# Patient Record
Sex: Female | Born: 1970
Health system: Southern US, Community
[De-identification: ages and names within clinical notes are randomized; demographics above are authoritative.]

## PROBLEM LIST (undated history)

## (undated) DIAGNOSIS — G709 Myoneural disorder, unspecified: Secondary | ICD-10-CM

## (undated) DIAGNOSIS — J302 Other seasonal allergic rhinitis: Secondary | ICD-10-CM

## (undated) DIAGNOSIS — F419 Anxiety disorder, unspecified: Secondary | ICD-10-CM

## (undated) DIAGNOSIS — R51 Headache: Secondary | ICD-10-CM

## (undated) DIAGNOSIS — J342 Deviated nasal septum: Secondary | ICD-10-CM

## (undated) DIAGNOSIS — F329 Major depressive disorder, single episode, unspecified: Secondary | ICD-10-CM

## (undated) DIAGNOSIS — G4730 Sleep apnea, unspecified: Secondary | ICD-10-CM

## (undated) DIAGNOSIS — G4700 Insomnia, unspecified: Secondary | ICD-10-CM

## (undated) DIAGNOSIS — J45909 Unspecified asthma, uncomplicated: Secondary | ICD-10-CM

## (undated) DIAGNOSIS — F32A Depression, unspecified: Secondary | ICD-10-CM

## (undated) DIAGNOSIS — K219 Gastro-esophageal reflux disease without esophagitis: Secondary | ICD-10-CM

## (undated) DIAGNOSIS — F411 Generalized anxiety disorder: Secondary | ICD-10-CM

## (undated) DIAGNOSIS — F319 Bipolar disorder, unspecified: Secondary | ICD-10-CM

## (undated) DIAGNOSIS — R112 Nausea with vomiting, unspecified: Secondary | ICD-10-CM

## (undated) DIAGNOSIS — J189 Pneumonia, unspecified organism: Secondary | ICD-10-CM

## (undated) DIAGNOSIS — Z9889 Other specified postprocedural states: Secondary | ICD-10-CM

## (undated) DIAGNOSIS — J343 Hypertrophy of nasal turbinates: Secondary | ICD-10-CM

## (undated) DIAGNOSIS — G4733 Obstructive sleep apnea (adult) (pediatric): Secondary | ICD-10-CM

## (undated) HISTORY — PX: ABDOMINAL HYSTERECTOMY: SHX81

## (undated) HISTORY — DX: Anxiety disorder, unspecified: F41.9

## (undated) HISTORY — DX: Sleep apnea, unspecified: G47.30

## (undated) HISTORY — DX: Insomnia, unspecified: G47.00

## (undated) HISTORY — PX: CARPAL TUNNEL RELEASE: SHX101

## (undated) HISTORY — DX: Depression, unspecified: F32.A

## (undated) HISTORY — DX: Generalized anxiety disorder: F41.1

## (undated) HISTORY — PX: DIAGNOSTIC LAPAROSCOPY: SUR761

## (undated) HISTORY — DX: Major depressive disorder, single episode, unspecified: F32.9

## (undated) HISTORY — DX: Obstructive sleep apnea (adult) (pediatric): G47.33

## (undated) HISTORY — PX: FOOT SURGERY: SHX648

---

## 1999-09-20 ENCOUNTER — Other Ambulatory Visit: Admission: RE | Admit: 1999-09-20 | Discharge: 1999-09-20 | Payer: Self-pay | Admitting: *Deleted

## 2000-09-19 ENCOUNTER — Other Ambulatory Visit: Admission: RE | Admit: 2000-09-19 | Discharge: 2000-09-19 | Payer: Self-pay | Admitting: Obstetrics and Gynecology

## 2001-11-08 ENCOUNTER — Other Ambulatory Visit: Admission: RE | Admit: 2001-11-08 | Discharge: 2001-11-08 | Payer: Self-pay | Admitting: Obstetrics and Gynecology

## 2002-08-02 ENCOUNTER — Inpatient Hospital Stay (HOSPITAL_COMMUNITY): Admission: AD | Admit: 2002-08-02 | Discharge: 2002-08-04 | Payer: Self-pay | Admitting: Obstetrics and Gynecology

## 2002-09-23 ENCOUNTER — Encounter: Admission: RE | Admit: 2002-09-23 | Discharge: 2002-10-23 | Payer: Self-pay | Admitting: Obstetrics and Gynecology

## 2002-11-23 ENCOUNTER — Encounter: Admission: RE | Admit: 2002-11-23 | Discharge: 2002-12-23 | Payer: Self-pay | Admitting: Obstetrics and Gynecology

## 2003-01-23 ENCOUNTER — Encounter: Admission: RE | Admit: 2003-01-23 | Discharge: 2003-02-22 | Payer: Self-pay | Admitting: Obstetrics and Gynecology

## 2003-11-20 ENCOUNTER — Other Ambulatory Visit: Admission: RE | Admit: 2003-11-20 | Discharge: 2003-11-20 | Payer: Self-pay | Admitting: Obstetrics and Gynecology

## 2004-07-10 ENCOUNTER — Emergency Department (HOSPITAL_COMMUNITY): Admission: EM | Admit: 2004-07-10 | Discharge: 2004-07-10 | Payer: Self-pay | Admitting: Emergency Medicine

## 2004-11-10 ENCOUNTER — Inpatient Hospital Stay (HOSPITAL_COMMUNITY): Admission: AD | Admit: 2004-11-10 | Discharge: 2004-11-10 | Payer: Self-pay | Admitting: Obstetrics and Gynecology

## 2004-12-07 ENCOUNTER — Inpatient Hospital Stay (HOSPITAL_COMMUNITY): Admission: AD | Admit: 2004-12-07 | Discharge: 2004-12-07 | Payer: Self-pay | Admitting: Obstetrics and Gynecology

## 2004-12-15 ENCOUNTER — Inpatient Hospital Stay (HOSPITAL_COMMUNITY): Admission: AD | Admit: 2004-12-15 | Discharge: 2004-12-17 | Payer: Self-pay | Admitting: Obstetrics and Gynecology

## 2004-12-16 ENCOUNTER — Encounter (INDEPENDENT_AMBULATORY_CARE_PROVIDER_SITE_OTHER): Payer: Self-pay | Admitting: Specialist

## 2004-12-16 HISTORY — PX: TUBAL LIGATION: SHX77

## 2005-01-17 ENCOUNTER — Other Ambulatory Visit: Admission: RE | Admit: 2005-01-17 | Discharge: 2005-01-17 | Payer: Self-pay | Admitting: Obstetrics and Gynecology

## 2007-03-14 ENCOUNTER — Encounter (INDEPENDENT_AMBULATORY_CARE_PROVIDER_SITE_OTHER): Payer: Self-pay | Admitting: Obstetrics and Gynecology

## 2007-03-14 ENCOUNTER — Inpatient Hospital Stay (HOSPITAL_COMMUNITY): Admission: RE | Admit: 2007-03-14 | Discharge: 2007-03-16 | Payer: Self-pay | Admitting: Obstetrics and Gynecology

## 2007-03-14 HISTORY — PX: LAPAROSCOPIC VAGINAL HYSTERECTOMY: SUR798

## 2008-04-16 ENCOUNTER — Encounter: Admission: RE | Admit: 2008-04-16 | Discharge: 2008-05-15 | Payer: Self-pay | Admitting: Orthopedic Surgery

## 2009-03-31 ENCOUNTER — Encounter: Admission: RE | Admit: 2009-03-31 | Discharge: 2009-03-31 | Payer: Self-pay | Admitting: Gastroenterology

## 2009-04-23 ENCOUNTER — Ambulatory Visit (HOSPITAL_COMMUNITY): Admission: RE | Admit: 2009-04-23 | Discharge: 2009-04-23 | Payer: Self-pay | Admitting: Gastroenterology

## 2009-07-26 ENCOUNTER — Emergency Department (HOSPITAL_BASED_OUTPATIENT_CLINIC_OR_DEPARTMENT_OTHER): Admission: EM | Admit: 2009-07-26 | Discharge: 2009-07-26 | Payer: Self-pay | Admitting: Emergency Medicine

## 2010-01-31 ENCOUNTER — Encounter: Payer: Self-pay | Admitting: Obstetrics and Gynecology

## 2010-05-25 NOTE — Op Note (Signed)
NAME:  Margaret Lopez, Margaret Lopez                 ACCOUNT NO.:  [id number]   MEDICAL RECORD NO.:  [id number]          PATIENT TYPE:  AMB   LOCATION:  SDC                           FACILITY:  [facility]   PHYSICIAN:  Malva Limes, M.D.    DATE OF BIRTH:  04-12-1970   DATE OF PROCEDURE:  03/14/2007  DATE OF DISCHARGE:                               OPERATIVE REPORT   PREOPERATIVE DIAGNOSES:  1. Menorrhagia.  2. Dysmenorrhea.   POSTOPERATIVE DIAGNOSES:  1. Menorrhagia.  2. Dysmenorrhea.   PROCEDURES:  1. Diagnostic laparoscopy.  2. Total vaginal hysterectomy.   SURGEON:  Malva Limes, M.D.   ASSISTANT:  Luvenia Redden, M.D.   ANESTHESIA:  General endotracheal.   ANTIBIOTICS:  Ancef 1 g.   DRAINS:  Foley to bedside drainage.   SPECIMENS:  Cervix and uterus sent to pathology.   COMPLICATIONS:  None.   ESTIMATED BLOOD LOSS:  100 mL.   FINDINGS:  On laparoscopy the patient had normal-appearing fallopian  tubes and ovaries.  There was no evidence of any pelvic adhesions or  endometriosis.  There was evidence of past tubal ligation on the right;  however, the left tube appeared to be normal.   PROCEDURE:  The patient was taken to the operating room, where a general  anesthetic was administered without complications.  She was then placed  in the dorsal lithotomy position.  A vertical skin incision was made in  the umbilicus.  The fascia was grasped and entered with the Mayo  scissors.  The parietal peritoneum was entered bluntly.  A 0 Monocryl  suture was placed in a pursestring fashion.  The Hasson cannula was  placed into the peritoneal cavity and 3 L of carbon dioxide was  insufflated.  The patient was then placed in Trendelenburg and  examination of the pelvic anatomy was undertaken.  Findings were noted  as above.  At this point the laparoscopy was concluded, the  pneumoperitoneum was released, the instruments removed and attention was  then turned to the vagina.  The cervix was grasped  with a single-tooth  tenaculum and injected with 1% lidocaine with epinephrine.  The  posterior cul-de-sac was entered sharply.  The uterosacral ligaments  were bilaterally clamped, cut and ligated with 0 Monocryl suture.  The  cervix was then circumscribed.  The bladder pillars were then  bilaterally clamped, cut and ligated with 0 Monocryl suture.  The  anterior cul-de-sac was entered sharply.  Cardinal ligaments were  serially clamped, cut and ligated with 0 Monocryl suture.  Uterine  vessels were bilaterally clamped, cut and ligated with 0 Monocryl  suture.  Remaining broad ligament was clamped, cut and ligated with 0  Monocryl suture.  Once the level of the round ligament was reached, the  uterus was inverted and the triple pedicle, which included the fallopian  tube, the round ligament and the ovarian ligament, was bilaterally  clamped, cut and ligated x2 with 0 Monocryl suture.  All pedicles were  then checked and found to be hemostatic.  At this point the catheter was  placed in the bladder  and the urine noted be clear.  The posterior cuff  was then closed using 2-0 Vicryl in a running locking fashion.  The  remaining vaginal cuff was closed using 2-0 Vicryl in a running locking  fashion in a vertical manner.  This concluded the procedure.  The  peritoneum was then insufflated again, the scope placed, and there was  no evidence of bleeding.  This concluded the laparoscopy.  The  instruments were removed, the pneumoperitoneum released, the incision  closed with 0 Monocryl suture, the skin with 4-0 Vicryl suture.  The  patient was extubated and taken to the recovery room in stable  condition.  Instrument and lap counts correct x2.           ______________________________  Malva Limes, M.D.     MA/MEDQ  D:  03/14/2007  T:  03/14/2007  Job:  [id number]

## 2010-05-25 NOTE — Discharge Summary (Signed)
[name], [name]                 ACCOUNT NO.:  [id number]   MEDICAL RECORD NO.:  [id number]          PATIENT TYPE:  INP   LOCATION:  9317                          FACILITY:  [facility]   PHYSICIAN:  Malva Limes, M.D.    DATE OF BIRTH:  10-13-1970   DATE OF ADMISSION:  03/14/2007  DATE OF DISCHARGE:  03/16/2007                               DISCHARGE SUMMARY   DISCHARGE DIAGNOSES:  1. Menorrhagia.  2. Dysmenorrhea.  3. History of endometriosis.   PROCEDURE:  1. Diagnostic laparoscopy.  2. Total vaginal hysterectomy.   HISTORY OF PRESENT ILLNESS:  Margaret Lopez is a 40 year old white female who  presented to [facility] on March 14, 2007 for a diagnostic  laparoscopy and a subsequent total vaginal hysterectomy,  secondary two-  year history of worsening dysmenorrhea and menorrhagia.  The patient did  have a diagnostic laparoscopy in 1992 with treatment of endometriosis at  that time. The patient underwent diagnostic laparoscopy at which time no  evidence of endometriosis or adhesions were seen. The ovaries and tubes  appeared to be normal.  The laparoscopy was discontinued and a total  vaginal hysterectomy accomplished without difficulty.  A complete  description of these procedures can be found in the dictated operative  note.  The patient's postoperative course was benign.  The patient's  preoperative hemoglobin was 12.9 and postop 9.9. The patient remained  afebrile. At the time of discharge, she was ambulate without difficulty  and eating a regular diet. The patient was discharged to home on postop  day #2. She was sent home with Percocet to take p.r.n. She will follow  up in the office in 4 weeks.  Pathology is pending at this time.           ______________________________  Malva Limes, M.D.     MA/MEDQ  D:  03/16/2007  T:  03/17/2007  Job:  [id number]

## 2010-05-28 NOTE — Op Note (Signed)
Margaret Lopez, Margaret Lopez                 ACCOUNT NO.:  [id number]   MEDICAL RECORD NO.:  [id number]          PATIENT TYPE:  INP   LOCATION:  9133                          [facility]ACILITY:  [facility]   PHYSICIAN:  Gerrit Friends. Aldona Bar, M.D.   DATE OF BIRTH:  [birth year]   DATE OF PROCEDURE:  12/16/2004  DATE OF DISCHARGE:                                 OPERATIVE REPORT   PREOPERATIVE DIAGNOSIS:  Postpartum desire for permanent elective  sterilization.   POSTOPERATIVE DIAGNOSES:  Postpartum desire for permanent elective  sterilization.  Pathology pending.   PROCEDURE:  [place] tubal sterilization for permanent elective sterilization  postpartum.   SURGEON:  Gerrit Friends. Aldona Bar, M.D.   ANESTHESIA:  Epidural.   HISTORY:  This 40 year old gravida 2, now para 2 delivered on the evening of  12/06 and expressed a desire for permanent elective sterilization procedure.  She is aware that such procedure is meant to be 100% permanent but  unfortunately is not 100% perfect- subsequent pregnancy can result.  According to her wishes. She is now being taken to the operating room for a  postpartum tubal sterilization procedure.   PROCEDURE:  The patient was taken to the operating room where epidural was  augmented with great success. Thereafter she was prepped and draped in the  usual fashion for a tubal sterilization procedure with the bladder being  emptied prior to arrival in the operating room.   When she was adequately draped and good anesthetic levels were documented,  procedure was begun.   The subumbilical area was elevated with Allis clamps and 1.5 to 2 cm  subumbilical midline transverse skin incision was made and with minimal  difficulty dissected down to and through the peritoneum without difficulty.  The fundus of the uterus was palpated and observed and noted to be  completely normal. The right adnexa felt normal - the right fallopian tube  was visualized, traced out the fimbriated end for positive  identification  and then in the midportion of the right fallopian tube, elevation was  created in a single tie of #1 plain catgut suture tied down about a knuckle  of tube. The knuckle of tube was excised and sent to pathology appropriately  labeled. Good hemostasis was noted. Similar procedure was carried out on  left fallopian tube. At this time with a segment of each fallopian tube  being removed and no pathology appreciated and hemostasis very adequate,  procedure was felt to be complete and closure of the abdomen was carried  out. The abdominal peritoneum was closed with 0 Vicryl in running fashion  and abdominal fascia was closed with 0 Vicryl in interrupted fashion. Skin  and subcutaneous tissue were closed with a  continuous 4-0 Vicryl subcuticular closure. Dressing was applied. The  patient was transported to recovery room in satisfactory condition having  tolerated the procedure well. Estimated blood loss negligible. All counts  correct x2. Pathologic specimen consisted of segment of each fallopian tube.      Gerrit Friends. Aldona Bar, M.D.  Electronically Signed     RMW/MEDQ  D:  12/16/2004  T:  12/16/2004  Job:  [id number]

## 2010-05-28 NOTE — Discharge Summary (Signed)
   NAME:  Margaret Lopez, Margaret Lopez                           ACCOUNT NO.:  [id number]   MEDICAL RECORD NO.:  [id number]                   PATIENT TYPE:  INP   LOCATION:  9148                                 [facility]ACILITY:  [facility]   PHYSICIAN:  Ilda Mori, M.D.                DATE OF BIRTH:  1970/07/30   DATE OF ADMISSION:  08/02/2002  DATE OF DISCHARGE:  08/04/2002                                 DISCHARGE SUMMARY   FINAL DIAGNOSES:  1. Uterine pregnancy, delivered.  2. Living, well female child.   SECONDARY DIAGNOSES:  None.   PROCEDURES:  1. Vacuum-assisted vaginal delivery.  2. Repair second-degree perineal laceration.   COMPLICATIONS:  None.   CONDITION ON DISCHARGE:  Improved.   HISTORY:  This is a 40 year old primigravida, with a due date of August 07, 2002, who was admitted, on August 02, 2002, at 39 plus weeks because of the  possibility of early toxemia, with blood pressure noted headache.  The  patient noted headaches, increased edema and a blood pressure of 130/80.  Her cervix was 2-cm size and 80% effaced.  The decision was made to proceed  with induction with a favorable cervix and term pregnancy and possible  toxemia.  The patient had a normal labor course after pushing for two hours.  It was felt that the vacuum assist was indicated.  This was performed with  the vertex at +3 station with the delivery of a female infant, Apgar scores 8  and 9, birth weight 8 pounds 4 ounces.  She had no episiotomy, second-degree  perineal laceration which was repaired.  She had had an epidural anesthesia  for labor and delivery.   The patient's postpartum course was benign.  On the morning of postpartum  day #2, she was felt to be ready for discharge.  She was discharged on a  regular diet.  Told to take over the counter pain medications, prenatal  vitamins and return to the office in four weeks.   LABORATORY DATA:  Revealed a postpartum hemoglobin of 8.6, white count of  10.9 and normal platelet  count.                                                 Ilda Mori, M.D.    RK/MEDQ  D:  09/06/2002  T:  09/07/2002  Job:  [id number]

## 2010-06-22 ENCOUNTER — Other Ambulatory Visit: Payer: Self-pay | Admitting: Obstetrics and Gynecology

## 2010-10-04 LAB — CBC
HCT: 37.1
Hemoglobin: 12.9
Hemoglobin: 9.9 — ABNORMAL LOW
Platelets: 219
RBC: 4.05
RDW: 12.5
RDW: 12.8
WBC: 7.4

## 2010-12-16 ENCOUNTER — Other Ambulatory Visit (INDEPENDENT_AMBULATORY_CARE_PROVIDER_SITE_OTHER): Payer: Self-pay | Admitting: Otolaryngology

## 2010-12-17 ENCOUNTER — Ambulatory Visit
Admission: RE | Admit: 2010-12-17 | Discharge: 2010-12-17 | Disposition: A | Discharge status: Home / Self Care | Payer: BC Managed Care – PPO | Source: Ambulatory Visit | Attending: Otolaryngology | Admitting: Otolaryngology

## 2011-02-11 DIAGNOSIS — J342 Deviated nasal septum: Secondary | ICD-10-CM

## 2011-02-11 DIAGNOSIS — J343 Hypertrophy of nasal turbinates: Secondary | ICD-10-CM

## 2011-02-11 HISTORY — DX: Deviated nasal septum: J34.2

## 2011-02-11 HISTORY — DX: Hypertrophy of nasal turbinates: J34.3

## 2011-02-18 ENCOUNTER — Encounter (HOSPITAL_BASED_OUTPATIENT_CLINIC_OR_DEPARTMENT_OTHER): Payer: Self-pay | Admitting: *Deleted

## 2011-02-22 ENCOUNTER — Ambulatory Visit (HOSPITAL_BASED_OUTPATIENT_CLINIC_OR_DEPARTMENT_OTHER): Payer: BC Managed Care – PPO | Admitting: Anesthesiology

## 2011-02-22 ENCOUNTER — Encounter (HOSPITAL_BASED_OUTPATIENT_CLINIC_OR_DEPARTMENT_OTHER): Payer: Self-pay | Admitting: Anesthesiology

## 2011-02-22 ENCOUNTER — Encounter (HOSPITAL_BASED_OUTPATIENT_CLINIC_OR_DEPARTMENT_OTHER): Payer: Self-pay | Admitting: *Deleted

## 2011-02-22 ENCOUNTER — Encounter (HOSPITAL_BASED_OUTPATIENT_CLINIC_OR_DEPARTMENT_OTHER)
Admission: RE | Disposition: A | Discharge status: Home / Self Care | Payer: Self-pay | Source: Ambulatory Visit | Attending: Otolaryngology

## 2011-02-22 ENCOUNTER — Ambulatory Visit (HOSPITAL_BASED_OUTPATIENT_CLINIC_OR_DEPARTMENT_OTHER)
Admission: RE | Admit: 2011-02-22 | Discharge: 2011-02-22 | Disposition: A | Discharge status: Home / Self Care | Payer: BC Managed Care – PPO | Source: Ambulatory Visit | Attending: Otolaryngology | Admitting: Otolaryngology

## 2011-02-22 DIAGNOSIS — K219 Gastro-esophageal reflux disease without esophagitis: Secondary | ICD-10-CM | POA: Insufficient documentation

## 2011-02-22 DIAGNOSIS — Z9889 Other specified postprocedural states: Secondary | ICD-10-CM

## 2011-02-22 DIAGNOSIS — F17200 Nicotine dependence, unspecified, uncomplicated: Secondary | ICD-10-CM | POA: Insufficient documentation

## 2011-02-22 DIAGNOSIS — R51 Headache: Secondary | ICD-10-CM | POA: Insufficient documentation

## 2011-02-22 DIAGNOSIS — J342 Deviated nasal septum: Secondary | ICD-10-CM | POA: Insufficient documentation

## 2011-02-22 DIAGNOSIS — J3489 Other specified disorders of nose and nasal sinuses: Secondary | ICD-10-CM | POA: Insufficient documentation

## 2011-02-22 DIAGNOSIS — J343 Hypertrophy of nasal turbinates: Secondary | ICD-10-CM | POA: Insufficient documentation

## 2011-02-22 HISTORY — DX: Gastro-esophageal reflux disease without esophagitis: K21.9

## 2011-02-22 HISTORY — DX: Deviated nasal septum: J34.2

## 2011-02-22 HISTORY — DX: Headache: R51

## 2011-02-22 HISTORY — DX: Hypertrophy of nasal turbinates: J34.3

## 2011-02-22 HISTORY — PX: NASAL SEPTOPLASTY W/ TURBINOPLASTY: SHX2070

## 2011-02-22 HISTORY — DX: Other seasonal allergic rhinitis: J30.2

## 2011-02-22 HISTORY — DX: Bipolar disorder, unspecified: F31.9

## 2011-02-22 LAB — POCT HEMOGLOBIN-HEMACUE: Hemoglobin: 12.1 g/dL (ref 12.0–15.0)

## 2011-02-22 SURGERY — SEPTOPLASTY, NOSE, WITH NASAL TURBINATE REDUCTION
Anesthesia: General | Site: Nose | Laterality: Bilateral | Wound class: Clean Contaminated

## 2011-02-22 MED ORDER — DEXAMETHASONE SODIUM PHOSPHATE 4 MG/ML IJ SOLN
INTRAMUSCULAR | Status: DC | PRN
  Administered 2011-02-22: 10 mg via INTRAVENOUS

## 2011-02-22 MED ORDER — MORPHINE SULFATE 4 MG/ML IJ SOLN: 0.0500 mg/kg | INTRAMUSCULAR | Status: DC | PRN

## 2011-02-22 MED ORDER — MUPIROCIN 2 % EX OINT
TOPICAL_OINTMENT | CUTANEOUS | Status: DC | PRN
  Administered 2011-02-22: 1 via NASAL

## 2011-02-22 MED ORDER — MIDAZOLAM HCL 5 MG/5ML IJ SOLN
INTRAMUSCULAR | Status: DC | PRN
  Administered 2011-02-22: 2 mg via INTRAVENOUS

## 2011-02-22 MED ORDER — DROPERIDOL 2.5 MG/ML IJ SOLN
INTRAMUSCULAR | Status: DC | PRN
  Administered 2011-02-22: 0.625 mg via INTRAVENOUS

## 2011-02-22 MED ORDER — LIDOCAINE HCL (CARDIAC) 20 MG/ML IV SOLN
INTRAVENOUS | Status: DC | PRN
  Administered 2011-02-22: 40 mg via INTRAVENOUS

## 2011-02-22 MED ORDER — ONDANSETRON HCL 4 MG/2ML IJ SOLN
INTRAMUSCULAR | Status: DC | PRN
  Administered 2011-02-22: 4 mg via INTRAVENOUS

## 2011-02-22 MED ORDER — FENTANYL CITRATE 0.05 MG/ML IJ SOLN
INTRAMUSCULAR | Status: DC | PRN
  Administered 2011-02-22 (×2): 25 ug via INTRAVENOUS
  Administered 2011-02-22: 50 ug via INTRAVENOUS
  Administered 2011-02-22 (×6): 25 ug via INTRAVENOUS
  Administered 2011-02-22: 50 ug via INTRAVENOUS

## 2011-02-22 MED ORDER — HYDROMORPHONE HCL PF 1 MG/ML IJ SOLN
0.2500 mg | INTRAMUSCULAR | Status: DC | PRN
  Administered 2011-02-22: 0.5 mg via INTRAVENOUS

## 2011-02-22 MED ORDER — OXYMETAZOLINE HCL 0.05 % NA SOLN
NASAL | Status: DC | PRN
  Administered 2011-02-22: 1 via NASAL

## 2011-02-22 MED ORDER — LACTATED RINGERS IV SOLN
INTRAVENOUS | Status: DC
  Administered 2011-02-22 (×2): via INTRAVENOUS

## 2011-02-22 MED ORDER — MIDAZOLAM HCL 2 MG/2ML IJ SOLN: 0.5000 mg | INTRAMUSCULAR | Status: DC | PRN

## 2011-02-22 MED ORDER — CEFAZOLIN SODIUM 1-5 GM-% IV SOLN
INTRAVENOUS | Status: DC | PRN
  Administered 2011-02-22: 1 g via INTRAVENOUS

## 2011-02-22 MED ORDER — ACETAMINOPHEN 10 MG/ML IV SOLN
1000.0000 mg | Freq: Once | INTRAVENOUS | Status: AC
  Administered 2011-02-22: 1000 mg via INTRAVENOUS

## 2011-02-22 MED ORDER — SUCCINYLCHOLINE CHLORIDE 20 MG/ML IJ SOLN
INTRAMUSCULAR | Status: DC | PRN
  Administered 2011-02-22: 100 mg via INTRAVENOUS

## 2011-02-22 MED ORDER — METOCLOPRAMIDE HCL 5 MG/ML IJ SOLN
10.0000 mg | Freq: Once | INTRAMUSCULAR | Status: AC | PRN
  Administered 2011-02-22: 10 mg via INTRAVENOUS

## 2011-02-22 MED ORDER — PROPOFOL 10 MG/ML IV EMUL
INTRAVENOUS | Status: DC | PRN
  Administered 2011-02-22: 250 mg via INTRAVENOUS

## 2011-02-22 MED ORDER — LIDOCAINE-EPINEPHRINE 1 %-1:100000 IJ SOLN
INTRAMUSCULAR | Status: DC | PRN
  Administered 2011-02-22: 5 mL via INTRADERMAL

## 2011-02-22 SURGICAL SUPPLY — 35 items
ATTRACTOMAT 16X20 MAGNETIC DRP (DRAPES) IMPLANT
BLADE SURG 15 STRL LF DISP TIS (BLADE) IMPLANT
BLADE SURG 15 STRL SS (BLADE)
CANISTER SUCTION 1200CC (MISCELLANEOUS) ×2 IMPLANT
CLOTH BEACON ORANGE TIMEOUT ST (SAFETY) ×2 IMPLANT
COAGULATOR SUCT 8FR VV (MISCELLANEOUS) ×2 IMPLANT
DECANTER SPIKE VIAL GLASS SM (MISCELLANEOUS) IMPLANT
DRSG NASOPORE 8CM (GAUZE/BANDAGES/DRESSINGS) IMPLANT
ELECT REM PT RETURN 9FT ADLT (ELECTROSURGICAL) ×2
ELECTRODE REM PT RTRN 9FT ADLT (ELECTROSURGICAL) ×1 IMPLANT
GAUZE SPONGE 4X4 12PLY STRL LF (GAUZE/BANDAGES/DRESSINGS) ×1 IMPLANT
GLOVE BIO SURGEON STRL SZ7.5 (GLOVE) ×2 IMPLANT
GLOVE SKINSENSE NS SZ7.0 (GLOVE) ×1
GLOVE SKINSENSE STRL SZ7.0 (GLOVE) IMPLANT
GOWN PREVENTION PLUS XLARGE (GOWN DISPOSABLE) ×3 IMPLANT
NDL HYPO 25X1 1.5 SAFETY (NEEDLE) ×1 IMPLANT
NEEDLE HYPO 25X1 1.5 SAFETY (NEEDLE) ×2 IMPLANT
NS IRRIG 1000ML POUR BTL (IV SOLUTION) ×2 IMPLANT
PACK BASIN DAY SURGERY FS (CUSTOM PROCEDURE TRAY) ×2 IMPLANT
PACK ENT DAY SURGERY (CUSTOM PROCEDURE TRAY) ×2 IMPLANT
SLEEVE SCD COMPRESS KNEE MED (MISCELLANEOUS) IMPLANT
SOLUTION BUTLER CLEAR DIP (MISCELLANEOUS) ×2 IMPLANT
SPLINT NASAL DOYLE BI-VL (GAUZE/BANDAGES/DRESSINGS) ×2 IMPLANT
SPONGE GAUZE 2X2 8PLY STRL LF (GAUZE/BANDAGES/DRESSINGS) ×2 IMPLANT
SPONGE NEURO XRAY DETECT 1X3 (DISPOSABLE) ×2 IMPLANT
SUT CHROMIC 4 0 P 3 18 (SUTURE) ×2 IMPLANT
SUT PLAIN 4 0 ~~LOC~~ 1 (SUTURE) ×2 IMPLANT
SUT PROLENE 3 0 PS 2 (SUTURE) ×2 IMPLANT
SUT VIC AB 4-0 P-3 18XBRD (SUTURE) IMPLANT
SUT VIC AB 4-0 P3 18 (SUTURE)
TOWEL OR 17X24 6PK STRL BLUE (TOWEL DISPOSABLE) ×2 IMPLANT
TUBE SALEM SUMP 12R W/ARV (TUBING) IMPLANT
TUBE SALEM SUMP 16 FR W/ARV (TUBING) ×2 IMPLANT
WATER STERILE IRR 1000ML POUR (IV SOLUTION) ×2 IMPLANT
YANKAUER SUCT BULB TIP NO VENT (SUCTIONS) ×2 IMPLANT

## 2011-02-22 NOTE — Op Note (Signed)
DATE OF PROCEDURE: 02/22/2011  OPERATIVE REPORT   SURGEON: Newman Pies, MD   PREOPERATIVE DIAGNOSES:  1. Nasal septal deviation.  2. Bilateral inferior turbinate hypertrophy.  3. Chronic nasal obstruction.  POSTOPERATIVE DIAGNOSES:  1. Nasal septal deviation.  2. Bilateral inferior turbinate hypertrophy.  3. Chronic nasal obstruction.  PROCEDURE PERFORMED:  1. Septoplasty.  2. Bilateral partial inferior turbinate resection.   ANESTHESIA: General endotracheal tube anesthesia.   COMPLICATIONS: None.   ESTIMATED BLOOD LOSS: Approximately 400 mL.   INDICATION FOR PROCEDURE: Margaret Lopez is a 41 y.o. female with a history of chronic nasal obstruction. The patient was  treated with antihistamine, decongestant, steroid nasal spray, and systemic steroids. However, the patient continues to be symptomatic. On examination, the patient was noted to have bilateral inferior turbinate hypertrophy and significant nasal septal deviation, causing significant nasal obstruction. Based on the above findings, the decision was made for the patient to undergo the above-stated procedure. The risks, benefits, alternatives, and details of the procedure were discussed with the patient. Questions were invited and answered. Informed consent was obtained.   DESCRIPTION OF PROCEDURE: The patient was taken to the operating room and placed supine on the operating table. General endotracheal tube anesthesia was administered by the anesthesiologist. The patient was positioned, and prepped and draped in the standard fashion for nasal surgery. Pledgets soaked with Afrin were placed in both nasal cavities for decongestion. The pledgets were subsequently removed. The above mentioned severe septal deviation was again noted. 1% lidocaine with 1:100,000 epinephrine was injected onto the nasal septum bilaterally. A hemitransfixion incision was made on the left side. The mucosal flap was carefully elevated on the left side. A  cartilaginous incision was made 1 cm superior to the caudal margin of the nasal septum. Mucosal flap was also elevated on the right side in the similar fashion. It should be noted that due to the severe septal deviation, the deviated portion of the cartilaginous and bony septum had to be removed in piecemeal fashion. Once the deviated portions were removed, a straight midline septum was achieved. The septum was then quilted with 4-0 plain gut sutures. The hemitransfixion incision was closed with interrupted 4-0 chromic sutures. Doyle splints were applied.   Prior to the [facility] splint application, the inferior one half of both hypertrophied inferior turbinate was crossclamped with a Kelly clamp. The inferior one half of each inferior turbinate was then resected with a pair of cross cutting scissors. Hemostasis was achieved with a suction cautery device.   The care of the patient was turned over to the anesthesiologist. The patient was awakened from anesthesia without difficulty. The patient was extubated and transferred to the recovery room in good condition.   OPERATIVE FINDINGS: Severe nasal septal deviation and bilateral inferior turbinate hypertrophy.   SPECIMEN: None.   FOLLOWUP CARE: The patient be discharged home once she is awake and alert. The patient will be placed on Percocet 1-2 tablets p.o. q.6 hours p.r.n. pain, and Zpak. for 5 days. The patient will follow up in my office in approximately 1 week for splint removal.   Margaret Lopez Philomena Doheny, MD

## 2011-02-22 NOTE — Discharge Instructions (Addendum)
POSTOPERATIVE INSTRUCTIONS FOR PATIENTS HAVING NASAL OR SINUS OPERATIONS ACTIVITY: Restrict activity at home for the first two days, resting as much as possible. Light activity is best. You may usually return to work within a week. You should refrain from nose blowing, strenuous activity, or heavy lifting greater than 20lbs for a total of three weeks after your operation.  If sneezing cannot be avoided, sneeze with your mouth open. DISCOMFORT: You may experience a dull headache and pressure along with nasal congestion and discharge. These symptoms may be worse during the first week after the operation but may last as long as two to four weeks.  Please take Tylenol or the pain medication that has been prescribed for you. Do not take aspirin or aspirin containing medications since they may cause bleeding.  You may experience symptoms of post nasal drainage, nasal congestion, headaches and fatigue for two or three months after your operation.  BLEEDING: You may have some blood tinged nasal drainage for approximately two weeks after the operation.  The discharge will be worse for the first week.  Please call our office at [phone] or go to the nearest hospital emergency room if you experience any of the following: heavy, bright red blood from your nose or mouth that lasts longer than ten minutes or coughing up or vomiting bright red blood or blood clots. GENERAL CONSIDERATIONS: 1. A gauze dressing will be placed on your upper lip to absorb any drainage after the operation. You may need to change this several times a day.  If you do not have very much drainage, you may remove the dressing.  Remember that you may gently wipe your nose with a tissue and sniff in, but DO NOT blow your nose. 2. Please keep all of your postoperative appointments.  Your final results after the operation will depend on proper follow-up.  The initial visit is usually four to seven days after the operation.  During this visit, the  remaining nasal packing and internal septal splints will be removed.  Your nasal and sinus cavities will be cleaned.  During the second visit, your nasal and sinus cavities will be cleaned again. Have someone drive you to your first two postoperative appointments. We suggest that you take your prescribed pain medication about  hour prior to each of these two appointments.  3. How you care for your nose after the operation will influence the results that you obtain.  You should follow all directions, take your medication as prescribed, and call our office [phone] with any problems or questions. 4. You may be more comfortable sleeping with your head elevated on two pillows. 5. Do not take any medications that we have not prescribed or recommended. WARNING SIGNS: if any of the following should occur, please call our office: 1. Bright red bleeding which lasts more than 10 minutes. 2. Persistent fever greater than 102F. 3. Persistent vomiting. 4. Severe and constant pain that is not relieved by prescribed pain medication. 5. Trauma to the nose. 6. Rash or unusual side effects from any medicine.  [facility]  [address] [place], [place] [address] [phone]   Post Anesthesia Home Care Instructions  Activity: Get plenty of rest for the remainder of the day. A responsible adult should stay with you for 24 hours following the procedure.  For the next 24 hours, DO NOT: -Drive a car -Advertising copywriter -Drink alcoholic beverages -Take any medication unless instructed by your physician -Make any legal decisions or sign  important papers.  Meals: Start with liquid foods such as gelatin or soup. Progress to regular foods as tolerated. Avoid greasy, spicy, heavy foods. If nausea and/or vomiting occur, drink only clear liquids until the nausea and/or vomiting subsides. Call your physician if vomiting continues.  Special Instructions/Symptoms: Your throat may feel dry  or sore from the anesthesia or the breathing tube placed in your throat during surgery. If this causes discomfort, gargle with warm salt water. The discomfort should disappear within 24 hours.

## 2011-02-22 NOTE — H&P (Signed)
H&P Update  Pt's original H&P dated 02/16/11 reviewed and placed in chart (to be scanned).  I personally examined the patient today.  No change in health. Proceed with septoplasty and bilateral partial inferior turbinate resection.

## 2011-02-22 NOTE — Anesthesia Preprocedure Evaluation (Signed)
Anesthesia Evaluation  Patient identified by MRN, date of birth, ID band Patient awake    Reviewed: Allergy & Precautions, H&P , NPO status , Patient's Chart, lab work & pertinent test results, reviewed documented beta blocker date and time   Airway Mallampati: II TM Distance: >3 FB Neck ROM: full    Dental   Pulmonary Current Smoker,          Cardiovascular neg cardio ROS     Neuro/Psych  Headaches, PSYCHIATRIC DISORDERS    GI/Hepatic Neg liver ROS, GERD-  Medicated and Controlled,  Endo/Other  Negative Endocrine ROS  Renal/GU negative Renal ROS  Genitourinary negative   Musculoskeletal   Abdominal   Peds  Hematology negative hematology ROS (+)   Anesthesia Other Findings See surgeon's H&P   Reproductive/Obstetrics negative OB ROS                           Anesthesia Physical Anesthesia Plan  ASA: III  Anesthesia Plan: General   Post-op Pain Management:    Induction: Intravenous  Airway Management Planned: Oral ETT  Additional Equipment:   Intra-op Plan:   Post-operative Plan: Extubation in OR  Informed Consent: I have reviewed the patients History and Physical, chart, labs and discussed the procedure including the risks, benefits and alternatives for the proposed anesthesia with the patient or authorized representative who has indicated his/her understanding and acceptance.     Plan Discussed with: CRNA and Surgeon  Anesthesia Plan Comments:         Anesthesia Quick Evaluation

## 2011-02-22 NOTE — Transfer of Care (Signed)
Immediate Anesthesia Transfer of Care Note  Patient: Margaret Lopez  Procedure(s) Performed: Procedure(s) (LRB): NASAL SEPTOPLASTY WITH TURBINATE REDUCTION (Bilateral)  Patient Location: PACU  Anesthesia Type: General  Level of Consciousness: awake and alert   Airway & Oxygen Therapy: Patient Spontanous Breathing and Patient connected to face mask oxygen  Post-op Assessment: Report given to PACU RN and Post -op Vital signs reviewed and stable  Post vital signs: Reviewed and stable  Complications: No apparent anesthesia complications

## 2011-02-22 NOTE — Anesthesia Postprocedure Evaluation (Signed)
Anesthesia Post Note  Patient: Margaret Lopez  Procedure(s) Performed: Procedure(s) (LRB): NASAL SEPTOPLASTY WITH TURBINATE REDUCTION (Bilateral)  Anesthesia type: General  Patient location: PACU  Post pain: Pain level controlled  Post assessment: Patient's Cardiovascular Status Stable  Last Vitals:  Filed Vitals:   02/22/11 1200  BP: 126/69  Pulse: 80  Temp:   Resp: 13    Post vital signs: Reviewed and stable  Level of consciousness: alert  Complications: No apparent anesthesia complications

## 2011-02-22 NOTE — Brief Op Note (Signed)
02/22/2011  10:52 AM  PATIENT:  Margaret Lopez  41 y.o. female  PRE-OPERATIVE DIAGNOSIS:  deviated septum, bilateral turbinate hypertrophy  POST-OPERATIVE DIAGNOSIS:  deviated septum, bilateral turbinate hypertrophy  PROCEDURE:  Procedure(s) (LRB): 1) NASAL SEPTOPLASTY 2) Bilateral partial inferior TURBINATE RESECTION  SURGEON:  Surgeon(s) and Role:    * Darletta Moll, MD - Primary  PHYSICIAN ASSISTANT:   ASSISTANTS: none   ANESTHESIA:   general  EBL:  Total I/O In: 1000 [I.V.:1000] Out: -   BLOOD ADMINISTERED:none  DRAINS: none   LOCAL MEDICATIONS USED:  LIDOCAINE 5cc  SPECIMEN:  No Specimen  DISPOSITION OF SPECIMEN:  N/A  COUNTS:  YES  TOURNIQUET:  * No tourniquets in log *  DICTATION: .Note written in EPIC  PLAN OF CARE: Discharge to home after PACU  PATIENT DISPOSITION:  PACU - hemodynamically stable.   Delay start of Pharmacological VTE agent (>24hrs) due to surgical blood loss or risk of bleeding: not applicable

## 2011-02-24 ENCOUNTER — Encounter (HOSPITAL_BASED_OUTPATIENT_CLINIC_OR_DEPARTMENT_OTHER): Payer: Self-pay | Admitting: Otolaryngology

## 2012-10-09 ENCOUNTER — Other Ambulatory Visit: Payer: Self-pay | Admitting: Obstetrics and Gynecology

## 2013-01-16 ENCOUNTER — Other Ambulatory Visit: Payer: Self-pay | Admitting: Specialist

## 2013-01-16 DIAGNOSIS — R51 Headache: Principal | ICD-10-CM

## 2013-01-16 DIAGNOSIS — R519 Headache, unspecified: Secondary | ICD-10-CM

## 2013-01-21 ENCOUNTER — Ambulatory Visit
Admission: RE | Admit: 2013-01-21 | Discharge: 2013-01-21 | Disposition: A | Discharge status: Home / Self Care | Payer: BC Managed Care – PPO | Source: Ambulatory Visit | Attending: Specialist | Admitting: Specialist

## 2013-01-21 DIAGNOSIS — R51 Headache: Principal | ICD-10-CM

## 2013-01-21 DIAGNOSIS — R519 Headache, unspecified: Secondary | ICD-10-CM

## 2013-01-28 ENCOUNTER — Other Ambulatory Visit: Payer: BC Managed Care – PPO

## 2013-02-13 ENCOUNTER — Ambulatory Visit: Admission: RE | Admit: 2013-02-13 | Payer: BC Managed Care – PPO | Source: Ambulatory Visit

## 2013-02-13 ENCOUNTER — Other Ambulatory Visit: Payer: Self-pay | Admitting: Specialist

## 2013-02-13 ENCOUNTER — Ambulatory Visit
Admission: RE | Admit: 2013-02-13 | Discharge: 2013-02-13 | Disposition: A | Discharge status: Home / Self Care | Payer: BC Managed Care – PPO | Source: Ambulatory Visit | Attending: Specialist | Admitting: Specialist

## 2013-02-13 DIAGNOSIS — R519 Headache, unspecified: Secondary | ICD-10-CM

## 2013-02-13 DIAGNOSIS — R51 Headache: Principal | ICD-10-CM

## 2013-04-16 ENCOUNTER — Ambulatory Visit (INDEPENDENT_AMBULATORY_CARE_PROVIDER_SITE_OTHER): Payer: BC Managed Care – PPO | Admitting: Psychiatry

## 2013-04-16 ENCOUNTER — Encounter (HOSPITAL_COMMUNITY): Payer: Self-pay | Admitting: Psychiatry

## 2013-04-16 VITALS — BP 114/76 | HR 88 | Ht 64.0 in | Wt 200.8 lb

## 2013-04-16 DIAGNOSIS — F411 Generalized anxiety disorder: Secondary | ICD-10-CM

## 2013-04-16 DIAGNOSIS — F17200 Nicotine dependence, unspecified, uncomplicated: Secondary | ICD-10-CM

## 2013-04-16 DIAGNOSIS — F314 Bipolar disorder, current episode depressed, severe, without psychotic features: Secondary | ICD-10-CM | POA: Insufficient documentation

## 2013-04-16 DIAGNOSIS — F319 Bipolar disorder, unspecified: Secondary | ICD-10-CM | POA: Insufficient documentation

## 2013-04-16 DIAGNOSIS — F3130 Bipolar disorder, current episode depressed, mild or moderate severity, unspecified: Secondary | ICD-10-CM

## 2013-04-16 HISTORY — DX: Generalized anxiety disorder: F41.1

## 2013-04-16 NOTE — Patient Instructions (Signed)
Discontinue Wellbutrin.  Have lab results faxed over to the clinic at [phone].
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
## 2013-04-23 ENCOUNTER — Encounter (HOSPITAL_COMMUNITY): Payer: Self-pay | Admitting: Psychiatry

## 2013-05-07 ENCOUNTER — Ambulatory Visit (HOSPITAL_COMMUNITY): Payer: Self-pay | Admitting: Marriage and Family Therapist

## 2013-05-09 ENCOUNTER — Ambulatory Visit (HOSPITAL_COMMUNITY): Payer: Self-pay | Admitting: Marriage and Family Therapist

## 2013-05-14 ENCOUNTER — Telehealth (HOSPITAL_COMMUNITY): Payer: Self-pay

## 2013-05-14 ENCOUNTER — Other Ambulatory Visit (HOSPITAL_COMMUNITY): Payer: Self-pay | Admitting: Psychiatry

## 2013-05-14 NOTE — Telephone Encounter (Signed)
Called pt and left a message asking her to let us know if she needed refills on any other meds (in addition to Lamictal) and to give [name] her pharmacy information.

## 2013-05-14 NOTE — Telephone Encounter (Signed)
S: Spoke with pt who was wondering if she could stop taking Lamictal. She has been off of Wellbutrin for 1 month and feels she is doing well. States she has a mail order prescription for a 90 day supply of Lamictal that she has not yet refilled. Has been taking all meds as prescribed.  R/P: Recommended that she continue Lamictal for now. Will discuss possibility of decreasing or stopping Lamictal at next visit. Since pt stopped Wellbutrin one month ago I would like to wait and see how she tolerates the change before making any other changes to her meds at this time. Pt verbalized understanding. She will continue taking all meds as prescribed. States she will refill Lamictal with the existing prescription that she has.

## 2013-06-13 ENCOUNTER — Ambulatory Visit (HOSPITAL_COMMUNITY): Payer: Self-pay | Admitting: Marriage and Family Therapist

## 2013-06-18 ENCOUNTER — Encounter (HOSPITAL_COMMUNITY): Payer: Self-pay | Admitting: Psychiatry

## 2013-06-18 ENCOUNTER — Ambulatory Visit (INDEPENDENT_AMBULATORY_CARE_PROVIDER_SITE_OTHER): Payer: BC Managed Care – PPO | Admitting: Psychiatry

## 2013-06-18 VITALS — BP 121/68 | HR 78 | Ht 64.0 in | Wt 200.6 lb

## 2013-06-18 DIAGNOSIS — F3130 Bipolar disorder, current episode depressed, mild or moderate severity, unspecified: Secondary | ICD-10-CM

## 2013-06-18 DIAGNOSIS — F17200 Nicotine dependence, unspecified, uncomplicated: Secondary | ICD-10-CM

## 2013-06-18 DIAGNOSIS — F411 Generalized anxiety disorder: Secondary | ICD-10-CM

## 2013-06-18 MED ORDER — LITHIUM CARBONATE 300 MG PO TABS: 300.0000 mg | ORAL_TABLET | Freq: Every day | ORAL | Status: DC

## 2013-06-18 NOTE — Patient Instructions (Signed)
Please get labs next week and send over results by 06/28/2013

## 2013-06-18 NOTE — Progress Notes (Signed)
[facility] [phone] Progress Note  Margaret Lopez [id number] 43 y.o.  06/18/2013 3:58 PM  Chief Complaint: medication management  History of Present Illness: Pt reports she ran out of Lamictal 3 weeks ago.  Pt is denying any SE including any skin reaction. She is more stressed due to family health problems and work.   She notes she is crying a little more but it's usually due to stress. Due to stress she is smoking about 3/4 ppd up from 1/2 ppd. Mood is ok. Depression is minor and manageable. Sleeping about 7 hrs/night. Pt will be starting Bipap soon for sleep apnea. Energy is low. Appetite is good. Reports on/off feelings of worthlessness and hopelessness. Still reports anhedonia.  Pt denies periods of time of increased irritablility, mood lability, increased energy and limited need to sleep and impulsivity.   Pt has taken Xanax 2x in one month. Overall anxiety is well controlled. Reports no concerns about her anxiety.   Things are still difficult at home a few days every week with her son.   Suicidal Ideation: No Plan Formed: No Patient has means to carry out plan: No  Homicidal Ideation: No Plan Formed: No Patient has means to carry out plan: No     Review of Systems: Psychiatric: Agitation: No Hallucination: No Depressed Mood: Yes Insomnia: No Hypersomnia: No Altered Concentration: No Feels Worthless: Yes Grandiose Ideas: No Belief In Special Powers: No New/Increased Substance Abuse: increase in the amount of smoking per day Compulsions: No  Neurologic: Headache: No Seizure: No Paresthesias: No  Past Medical Family, Social History: [name] in own house with her 2 sons. Seperated from husband. Smokes 1/2 ppd, denies regular alcohol use. Denies illicit substance abuse.    Outpatient Encounter Prescriptions as of 06/18/2013  Medication Sig  . ALPRAZolam (XANAX) 0.5 MG tablet Take 0.5 mg by mouth at bedtime as needed for anxiety.  . divalproex (DEPAKOTE)  500 MG DR tablet Take 3,000 mg by mouth at bedtime.   [name] esomeprazole (NEXIUM) 40 MG capsule Take 40 mg by mouth 2 (two) times daily.  [name] lamoTRIgine (LAMICTAL) 150 MG tablet Take 150 mg by mouth at bedtime.   [name] levocetirizine (XYZAL) 5 MG tablet Take 5 mg by mouth every evening.  . Lurasidone HCl (LATUDA) 120 MG TABS Take 120 mg by mouth 1 day or 1 dose.  . Multiple Vitamin (MULTIVITAMIN) tablet Take 1 tablet by mouth daily.    Past Psychiatric History/Hospitalization(s): Anxiety: Yes Bipolar Disorder: Yes Depression: Yes Mania: Yes Psychosis: No Schizophrenia: No Personality Disorder: No Hospitalization for psychiatric illness: No History of Electroconvulsive Shock Therapy: No Prior Suicide Attempts: No  Physical Exam: Constitutional:  There were no vitals taken for this visit.  General Appearance: alert, oriented, no acute distress  Musculoskeletal: Strength & Muscle Tone: within normal limits Gait & Station: normal Patient leans: N/A  Mental Status Examination/Evaluation:  Objective: Appearance: Neat and Well Groomed   Eye Contact:: Good   Speech: Clear and Coherent and Normal Rate   Volume: Normal   Mood: dysphoric   Affect: Depressed   Thought Process: Goal Directed, Intact and Logical   Orientation: Full (Time, Place, and Person)   Thought Content: NA   Suicidal Thoughts: No   Homicidal Thoughts: No   Judgement: Intact   Insight: Good   Psychomotor Activity: Normal   Akathisia: No   Handed: Right   AIMS (if indicated): 0     Medical Decision Making (Choose Three): Established Problem, Stable/Improving (1), Review  of Psycho-Social Stressors (1), Review of Medication Regimen & Side Effects (2) and Review of New Medication or Change in Dosage (2)  Assessment: Axis I: Bipolar- depressed; GAD, Nicotine Dependence  Axis II: deferred  Axis III: Headaches, GERD, seasonal allergies, nasal turbinate hypertrophy, sleep apnea  Axis IV: problem with sons,  occupational stress  Axis V: 30   Plan: Continue Depakote 2500mg  po qD for mood stabalization Continue Latuda 120mg  po qD for mood stabalization Continue Xanax 0.5mg  po qD prn anxiety Start trial of Lithium 300mg  po qD for mood D/C Lamictal  Start trial of Lithium, risks/benefits and SE of the medication discussed. Pt verbalized understanding and verbal consent obtained for treatment.  Affirm with the patient that the medications are taken as ordered. Patient expressed understanding of how their medications were to be used.   Pt will get labs next week and fax over the results  Therapy: brief supportive therapy provided. Discussed psychosocial stressors in detail.    Pt denies SI and is at an acute low risk for suicide.Patient told to call clinic if any problems occur. Patient advised to go to ER  if she should develop SI/HI, side effects, or if symptoms worsen. Has crisis numbers to call if needed. Pt verbalized understanding.  F/up in 2 months or sooner if needed  Charlcie Cradle, MD 06/18/2013

## 2013-08-22 ENCOUNTER — Ambulatory Visit (INDEPENDENT_AMBULATORY_CARE_PROVIDER_SITE_OTHER): Payer: BC Managed Care – PPO | Admitting: Psychiatry

## 2013-08-22 ENCOUNTER — Encounter (HOSPITAL_COMMUNITY): Payer: Self-pay | Admitting: Psychiatry

## 2013-08-22 VITALS — BP 148/70 | HR 76 | Ht 64.0 in | Wt 206.2 lb

## 2013-08-22 DIAGNOSIS — F17200 Nicotine dependence, unspecified, uncomplicated: Secondary | ICD-10-CM

## 2013-08-22 DIAGNOSIS — F319 Bipolar disorder, unspecified: Secondary | ICD-10-CM

## 2013-08-22 DIAGNOSIS — F411 Generalized anxiety disorder: Secondary | ICD-10-CM

## 2013-08-22 DIAGNOSIS — F3130 Bipolar disorder, current episode depressed, mild or moderate severity, unspecified: Secondary | ICD-10-CM

## 2013-08-22 MED ORDER — ALPRAZOLAM 0.5 MG PO TABS: 0.5000 mg | ORAL_TABLET | Freq: Every evening | ORAL | Status: DC | PRN

## 2013-08-22 MED ORDER — DIVALPROEX SODIUM 500 MG PO DR TAB: 2000.0000 mg | DELAYED_RELEASE_TABLET | Freq: Every day | ORAL | Status: DC

## 2013-08-22 MED ORDER — LITHIUM CARBONATE 300 MG PO TABS: ORAL_TABLET | ORAL | Status: DC

## 2013-08-22 MED ORDER — LURASIDONE HCL 120 MG PO TABS: 120.0000 mg | ORAL_TABLET | ORAL | Status: DC

## 2013-08-22 MED ORDER — LITHIUM CARBONATE 300 MG PO TABS: 300.0000 mg | ORAL_TABLET | Freq: Every day | ORAL | Status: DC
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
## 2013-09-02 ENCOUNTER — Telehealth (HOSPITAL_COMMUNITY): Payer: Self-pay | Admitting: *Deleted

## 2013-09-02 NOTE — Telephone Encounter (Signed)
VM received 8/24 @ 0948:RX for Depakote XR 500 mg is for 4 at [facility]>Past Rx for 6 at [facility].Is this change or error? Ref# for call [id number]  Per MD notes 08/22/13: "Continue Depakote and decrease to 2000mg  po qD for mood stabalization. Labs done on 07/04/13 show 99 Depakote level, LFT WNL"  Contacted [facility]: Left message on MD VM with all pertinent info requested regarding dose change by MD on 08/22/13

## 2013-09-03 NOTE — Telephone Encounter (Signed)
I filled out a fax request last week and corrected the medication to be Depakote ER.

## 2013-09-20 ENCOUNTER — Ambulatory Visit (INDEPENDENT_AMBULATORY_CARE_PROVIDER_SITE_OTHER): Payer: BC Managed Care – PPO | Admitting: Psychology

## 2013-09-20 DIAGNOSIS — F3011 Manic episode without psychotic symptoms, mild: Secondary | ICD-10-CM

## 2013-10-04 ENCOUNTER — Ambulatory Visit (INDEPENDENT_AMBULATORY_CARE_PROVIDER_SITE_OTHER): Payer: BC Managed Care – PPO | Admitting: Psychology

## 2013-10-04 DIAGNOSIS — F3011 Manic episode without psychotic symptoms, mild: Secondary | ICD-10-CM

## 2013-10-17 ENCOUNTER — Other Ambulatory Visit: Payer: Self-pay | Admitting: Obstetrics and Gynecology

## 2013-10-18 LAB — CYTOLOGY - PAP

## 2013-10-22 ENCOUNTER — Telehealth (HOSPITAL_COMMUNITY): Payer: Self-pay | Admitting: *Deleted

## 2013-10-22 ENCOUNTER — Other Ambulatory Visit (HOSPITAL_COMMUNITY): Payer: Self-pay | Admitting: *Deleted

## 2013-10-22 DIAGNOSIS — F3130 Bipolar disorder, current episode depressed, mild or moderate severity, unspecified: Secondary | ICD-10-CM

## 2013-10-22 DIAGNOSIS — F319 Bipolar disorder, unspecified: Secondary | ICD-10-CM

## 2013-10-22 DIAGNOSIS — F411 Generalized anxiety disorder: Secondary | ICD-10-CM

## 2013-10-22 MED ORDER — LITHIUM CARBONATE 300 MG PO TABS: ORAL_TABLET | ORAL | Status: DC

## 2013-10-22 MED ORDER — LURASIDONE HCL 120 MG PO TABS: 120.0000 mg | ORAL_TABLET | ORAL | Status: DC

## 2013-10-22 NOTE — Telephone Encounter (Signed)
Pt called stating her mail order pharmacy just informed her that they do not have any script on file for her Depakote, Lithium or Latuda. Per pt they lost her script. Pt wanted office to send in 90 days worth of Rx to mail order pharmacy and about 2 weeks of her Lithium and Latuda to local pharmacy. Looked in pt chart, our records indicated that we did send rx to pharmacy and it was time stamped that pharmacy did receive Rx refill. Called pharmacy and they stated that the company that we sent Rx to closed but they are another branch of them but are unable to retreive those medications and needed our office to resend Rx for pt. Informed Dr. Doyne Keel with what happened and per Dr. Doyne Keel to call pt to make a follow up appt and to only fill pt medication just enough until pt follow up appt and at that time she and pt can discuss where to send her prescriptions. Called pt to talk to her about her medication request. LM on pt voicemail to call office back at 10:21 am on 10-22-13.

## 2013-10-22 NOTE — Telephone Encounter (Signed)
Per Dr. Doyne Keel to only fill pt medication just enough to get her to her follow up appt and the day of appt they will talk about sending medication to mail order. Called pt to inform her and she showed understanding. Called pt Lithium and Latuda into pharmacy and spoke with [facility] the pharmacist. Per [place] he will go ahead and fill pt medication.

## 2013-10-24 ENCOUNTER — Other Ambulatory Visit (HOSPITAL_COMMUNITY): Payer: Self-pay | Admitting: *Deleted

## 2013-10-24 DIAGNOSIS — F411 Generalized anxiety disorder: Secondary | ICD-10-CM

## 2013-10-24 DIAGNOSIS — F319 Bipolar disorder, unspecified: Secondary | ICD-10-CM

## 2013-10-24 DIAGNOSIS — F3130 Bipolar disorder, current episode depressed, mild or moderate severity, unspecified: Secondary | ICD-10-CM

## 2013-10-24 MED ORDER — LITHIUM CARBONATE 300 MG PO TABS: ORAL_TABLET | ORAL | Status: DC

## 2013-10-24 MED ORDER — LURASIDONE HCL 120 MG PO TABS: 120.0000 mg | ORAL_TABLET | ORAL | Status: DC

## 2013-10-24 MED ORDER — DIVALPROEX SODIUM 500 MG PO DR TAB: 2000.0000 mg | DELAYED_RELEASE_TABLET | Freq: Every day | ORAL | Status: DC

## 2013-11-01 ENCOUNTER — Ambulatory Visit (INDEPENDENT_AMBULATORY_CARE_PROVIDER_SITE_OTHER): Payer: BC Managed Care – PPO | Admitting: Psychology

## 2013-11-01 DIAGNOSIS — F3011 Manic episode without psychotic symptoms, mild: Secondary | ICD-10-CM

## 2013-11-05 ENCOUNTER — Encounter (HOSPITAL_COMMUNITY): Payer: Self-pay | Admitting: Psychiatry

## 2013-11-05 ENCOUNTER — Ambulatory Visit (INDEPENDENT_AMBULATORY_CARE_PROVIDER_SITE_OTHER): Payer: BC Managed Care – PPO | Admitting: Psychiatry

## 2013-11-05 VITALS — BP 130/80 | HR 78 | Ht 64.0 in | Wt 206.6 lb

## 2013-11-05 DIAGNOSIS — F3130 Bipolar disorder, current episode depressed, mild or moderate severity, unspecified: Secondary | ICD-10-CM

## 2013-11-05 DIAGNOSIS — F411 Generalized anxiety disorder: Secondary | ICD-10-CM

## 2013-11-05 DIAGNOSIS — F319 Bipolar disorder, unspecified: Secondary | ICD-10-CM

## 2013-11-05 DIAGNOSIS — F17200 Nicotine dependence, unspecified, uncomplicated: Secondary | ICD-10-CM

## 2013-11-05 MED ORDER — ALPRAZOLAM 0.5 MG PO TABS: 0.5000 mg | ORAL_TABLET | Freq: Every evening | ORAL | Status: DC | PRN

## 2013-11-05 MED ORDER — DIVALPROEX SODIUM 500 MG PO DR TAB: 2000.0000 mg | DELAYED_RELEASE_TABLET | Freq: Every day | ORAL | Status: DC

## 2013-11-05 MED ORDER — LURASIDONE HCL 120 MG PO TABS: 120.0000 mg | ORAL_TABLET | ORAL | Status: DC

## 2013-11-05 MED ORDER — LITHIUM CARBONATE 300 MG PO TABS: ORAL_TABLET | ORAL | Status: DC

## 2013-11-05 NOTE — Progress Notes (Signed)
[facility] [phone] Progress Note  Margaret Lopez [id number] 43 y.o.  11/05/2013 2:11 PM  Chief Complaint: things are good  History of Present Illness: Pt was off of Lithium for 2-3 weeks b/c she couldn't afford to get a refill. She has since restarted and feels they are working.  Mood is ok. Depression is at low level on a near daily basis. Her stress level has been high recently. Isolation, worthlessness, hopelessness and anhedonia continue to improve slowly. Sleep is good with Bipap and she is getting about 8 hrs a night. Energy is on the low side. Appetite is fair. Concentration is ok but could be better.   Pt has not taken Xanax in a month. Anxiety is well controlled.   Denies manic and hypomanic symptoms including periods of decreased need for sleep, increased energy, mood lability, impulsivity, FOI, and excessive spending. Denies irritability.   4-5x in 3 weeks she has slept with different men that she has met online. Notes it is dangerous but she was looking for someone to have a relationship with.   Pt is taking Depakote, Lithium, Latuda and Xanax as prescribed and denies SE.  Has not noticed any changes in mood after Depakote was decreased.   Suicidal Ideation: No Plan Formed: No Patient has means to carry out plan: No  Homicidal Ideation: No Plan Formed: No Patient has means to carry out plan: No  Review of Systems: Psychiatric: Agitation: No Hallucination: No Depressed Mood: Yes Insomnia: No Hypersomnia: No Altered Concentration: No Feels Worthless: Yes Grandiose Ideas: No Belief In Special Powers: No New/Increased Substance Abuse: No Compulsions: No  Neurologic: Headache: No  Seizure: No Paresthesias: No  Review of Systems  Constitutional: Negative for fever, chills and malaise/fatigue.  HENT: Negative for congestion and sore throat.   Eyes: Negative for blurred vision and double vision.  Respiratory: Negative for cough and sputum production.    Cardiovascular: Negative for chest pain and palpitations.  Gastrointestinal: Positive for heartburn. Negative for nausea, vomiting and abdominal pain.  Musculoskeletal: Negative for back pain, joint pain and neck pain.  Skin: Negative for itching and rash.  Neurological: Negative for dizziness, tremors, sensory change, seizures and headaches.  Psychiatric/Behavioral: Positive for depression. Negative for suicidal ideas, hallucinations and substance abuse. The patient is not nervous/anxious and does not have insomnia.       Past Medical Family, Social History: Margaret Lopez in own house with her 2 sons. Seperated from husband. Smokes 1/2-1 ppd, denies regular alcohol use. Denies illicit substance abuse.  Family History  Problem Relation Age of Onset  . ADD / ADHD Sister   . Alcohol abuse Maternal Grandmother   . Bipolar disorder Maternal Grandmother   . Suicidality Neg Hx     Past Medical History  Diagnosis Date  . Deviated nasal septum 02/2011  . Nasal turbinate hypertrophy 02/2011    bilat.  [name] Headache(784.0)     migraines  . GERD (gastroesophageal reflux disease)   . Bipolar disorder   . Seasonal allergies   . Anxiety   . Depression   . Sleep apnea     Outpatient Encounter Prescriptions as of 11/05/2013  Medication Sig  . ALPRAZolam (XANAX) 0.5 MG tablet Take 1 tablet (0.5 mg total) by mouth at bedtime as needed for anxiety.  . divalproex (DEPAKOTE) 500 MG DR tablet Take 4 tablets (2,000 mg total) by mouth at bedtime.  [name] esomeprazole (NEXIUM) 40 MG capsule Take 40 mg by mouth 2 (two) times daily.  [name]  levocetirizine (XYZAL) 5 MG tablet Take 5 mg by mouth every evening.  . lithium 300 MG tablet Take 333m po qHS for mood lability.  . Lurasidone HCl (LATUDA) 120 MG TABS Take 1 tablet (120 mg total) by mouth 1 day or 1 dose.  . Multiple Vitamin (MULTIVITAMIN) tablet Take 1 tablet by mouth daily.    Past Psychiatric History/Hospitalization(s): Anxiety: Yes Bipolar Disorder:  Yes Depression: Yes Mania: Yes Psychosis: No Schizophrenia: No Personality Disorder: No Hospitalization for psychiatric illness: No History of Electroconvulsive Shock Therapy: No Prior Suicide Attempts: No  Physical Exam: Constitutional:  BP 130/80  Pulse 78  Ht '5\' 4"'  (1.626 m)  Wt 206 lb 9.6 oz (93.713 kg)  BMI 35.45 kg/m2  General Appearance: alert, oriented, no acute distress  Musculoskeletal: Strength & Muscle Tone: within normal limits Gait & Station: normal Patient leans: N/A  Mental Status Examination/Evaluation: Objective: Attitude: Calm and cooperative  Appearance: Casual, appears to be stated age  Eye Contact::  Fair  Speech:  Clear and Coherent and Normal Rate  Volume:  Normal  Mood:  euthymic  Affect:  Constricted  Thought Process:  Linear and Logical  Orientation:  Full (Time, Place, and Person)  Thought Content:  Negative  Suicidal Thoughts:  No  Homicidal Thoughts:  No  Judgement:  Fair  Insight:  Fair  Concentration: good  Memory: Immediate-fair Recent-fair Remote-fair  Recall: fair  Language: fair  Gait and Station: normal  [employer]: average  Psychomotor Activity:  Normal  Akathisia:  No  Handed:  Right  AIMS (if indicated):  Facial and Oral Movements  Muscles of Facial Expression: None, normal  Lips and Perioral Area: None, normal  Jaw: None, normal  Tongue: None, normal Extremity Movements: Upper (arms, wrists, hands, fingers): None, normal  Lower (legs, knees, ankles, toes): None, normal,  Trunk Movements:  Neck, shoulders, hips: None, normal,  Overall Severity : Severity of abnormal movements (highest score from questions above): None, normal  Incapacitation due to abnormal movements: None, normal  Patient's awareness of abnormal movements (rate only patient's report): No Awareness, Dental Status  Current problems with teeth and/or dentures?: No  Does patient usually wear dentures?: No         Medical  Decision Making (Choose Three): Established Problem, Stable/Improving (1), Review of Psycho-Social Stressors (1), Review of Last Therapy Session (1) and Review of Medication Regimen & Side Effects (2)  Assessment: Axis I: Bipolar- depressed, recurrent episode and mild; GAD, Nicotine Dependence  Axis II: deferred  Axis III:  Past Medical History  Diagnosis Date  . Deviated nasal septum 02/2011  . Nasal turbinate hypertrophy 02/2011    bilat.  .Marland KitchenHeadache(784.0)     migraines  . GERD (gastroesophageal reflux disease)   . Bipolar disorder   . Seasonal allergies   . Anxiety   . Depression   . Sleep apnea      Axis IV: problem with sons, occupational stress, limited coping skills  Axis V: 592  Plan: Continue Depakote 20060mpo qD for mood stabalization. Labs done on 07/04/13 show 99 Depakote level, LFT WNL Continue Latuda 1205mo qD for mood stabalization Continue Xanax 0.5mg86m qD prn anxiety Continue Lithium 300mg101mqD for mood- labs show Creat and TSH WNL -Pt requires multiple mood stabalizers to keep her mood level. She has shown significantly disruptive symptoms when treated with only one mood stabalizer. Pt is tolerating the regime without SE.    Medication management with supportive therapy.  Risks/benefits and SE of the medication discussed. Pt verbalized understanding and verbal consent obtained for treatment.  Affirm with the patient that the medications are taken as ordered. Patient expressed understanding of how their medications were to be used.  -improvement of symptoms  Will order labs at next visit  Therapy: brief supportive therapy provided. Discussed psychosocial stressors in detail.    Pt denies SI and is at an acute low risk for suicide.Patient told to call clinic if any problems occur. Patient advised to go to ER  if she should develop SI/HI, side effects, or if symptoms worsen. Has crisis numbers to call if needed. Pt verbalized understanding.  F/up in 3  months or sooner if needed  Charlcie Cradle, MD 11/05/2013

## 2013-11-21 ENCOUNTER — Ambulatory Visit (HOSPITAL_COMMUNITY): Payer: Self-pay | Admitting: Psychiatry

## 2013-11-22 ENCOUNTER — Ambulatory Visit (INDEPENDENT_AMBULATORY_CARE_PROVIDER_SITE_OTHER): Payer: BC Managed Care – PPO | Admitting: Psychology

## 2013-11-22 DIAGNOSIS — F3011 Manic episode without psychotic symptoms, mild: Secondary | ICD-10-CM

## 2013-12-27 ENCOUNTER — Ambulatory Visit: Payer: BC Managed Care – PPO | Admitting: Psychology

## 2014-02-27 ENCOUNTER — Other Ambulatory Visit (HOSPITAL_COMMUNITY): Payer: Self-pay

## 2014-02-27 DIAGNOSIS — F3130 Bipolar disorder, current episode depressed, mild or moderate severity, unspecified: Secondary | ICD-10-CM

## 2014-02-27 MED ORDER — LITHIUM CARBONATE 300 MG PO TABS: ORAL_TABLET | ORAL | Status: DC

## 2014-02-27 NOTE — Telephone Encounter (Signed)
Patient left a message requesting a refill of Lithium, enough to last until appointment 03/04/14 as states does not have enough to last until then.  90 day supply last written 11/05/13.

## 2014-02-27 NOTE — Telephone Encounter (Signed)
Met with Dr. Doyne Keel to inform of patient's request for a temporary supply of Lithium to last until patient comes in for evaluation on 03/04/14.  Dr. Doyne Keel authorized a 10 day supply of Lithium 353m tablets, 1 at bedtime.  Telephone call with patient to inform 10 day supply of lithium authorized by Dr. ADoyne Keeland [facility]. Patient agreed to keep appointment in the coming week on 03/04/14.

## 2014-03-01 ENCOUNTER — Encounter (HOSPITAL_COMMUNITY): Payer: Self-pay | Admitting: *Deleted

## 2014-03-01 ENCOUNTER — Emergency Department (HOSPITAL_COMMUNITY)
Admission: EM | Admit: 2014-03-01 | Discharge: 2014-03-01 | Disposition: A | Discharge status: Home / Self Care | Payer: BLUE CROSS/BLUE SHIELD | Attending: Emergency Medicine | Admitting: Emergency Medicine

## 2014-03-01 DIAGNOSIS — F419 Anxiety disorder, unspecified: Secondary | ICD-10-CM | POA: Insufficient documentation

## 2014-03-01 DIAGNOSIS — I10 Essential (primary) hypertension: Secondary | ICD-10-CM | POA: Diagnosis not present

## 2014-03-01 DIAGNOSIS — Z880 Allergy status to penicillin: Secondary | ICD-10-CM | POA: Insufficient documentation

## 2014-03-01 DIAGNOSIS — G43809 Other migraine, not intractable, without status migrainosus: Secondary | ICD-10-CM

## 2014-03-01 DIAGNOSIS — G43909 Migraine, unspecified, not intractable, without status migrainosus: Secondary | ICD-10-CM | POA: Diagnosis present

## 2014-03-01 DIAGNOSIS — Z8709 Personal history of other diseases of the respiratory system: Secondary | ICD-10-CM | POA: Diagnosis not present

## 2014-03-01 DIAGNOSIS — F3131 Bipolar disorder, current episode depressed, mild: Secondary | ICD-10-CM | POA: Diagnosis not present

## 2014-03-01 DIAGNOSIS — Z720 Tobacco use: Secondary | ICD-10-CM | POA: Insufficient documentation

## 2014-03-01 DIAGNOSIS — Z79899 Other long term (current) drug therapy: Secondary | ICD-10-CM | POA: Insufficient documentation

## 2014-03-01 DIAGNOSIS — Z8669 Personal history of other diseases of the nervous system and sense organs: Secondary | ICD-10-CM | POA: Diagnosis not present

## 2014-03-01 DIAGNOSIS — K219 Gastro-esophageal reflux disease without esophagitis: Secondary | ICD-10-CM | POA: Insufficient documentation

## 2014-03-01 MED ORDER — ONDANSETRON HCL 4 MG/2ML IJ SOLN
4.0000 mg | Freq: Once | INTRAMUSCULAR | Status: AC
  Administered 2014-03-01: 4 mg via INTRAVENOUS
  Filled 2014-03-01: qty 2

## 2014-03-01 MED ORDER — SODIUM CHLORIDE 0.9 % IV BOLUS (SEPSIS)
1000.0000 mL | Freq: Once | INTRAVENOUS | Status: AC
  Administered 2014-03-01: 1000 mL via INTRAVENOUS

## 2014-03-01 MED ORDER — KETOROLAC TROMETHAMINE 30 MG/ML IJ SOLN
30.0000 mg | Freq: Once | INTRAMUSCULAR | Status: AC
  Administered 2014-03-01: 30 mg via INTRAVENOUS
  Filled 2014-03-01: qty 1

## 2014-03-01 MED ORDER — DIPHENHYDRAMINE HCL 50 MG/ML IJ SOLN
25.0000 mg | Freq: Once | INTRAMUSCULAR | Status: AC
  Administered 2014-03-01: 25 mg via INTRAVENOUS
  Filled 2014-03-01: qty 1

## 2014-03-01 MED ORDER — METOCLOPRAMIDE HCL 5 MG/ML IJ SOLN
10.0000 mg | Freq: Once | INTRAMUSCULAR | Status: AC
  Administered 2014-03-01: 10 mg via INTRAVENOUS
  Filled 2014-03-01: qty 2

## 2014-03-01 MED ORDER — OXYCODONE-ACETAMINOPHEN 5-325 MG PO TABS
1.0000 | ORAL_TABLET | Freq: Once | ORAL | Status: AC
  Administered 2014-03-01: 1 via ORAL
  Filled 2014-03-01: qty 1

## 2014-03-01 NOTE — Discharge Instructions (Signed)

## 2014-03-01 NOTE — ED Provider Notes (Signed)
CSN: [id number]     Arrival date & time 03/01/14  1712 History   First MD Initiated Contact with Patient 03/01/14 1722     Chief Complaint  Patient presents with  . Migraine   (Consider location/radiation/quality/duration/timing/severity/associated sxs/prior Treatment) HPI Comments: Pt with hx of migraines.  Pt see's headache specialist, last appointment 11th of this month, received facial/cervical injections with improvement in daily HA.  This HA started yesterday, worsening today, described as throbbing, involving entire head, with associated nausea, but no vomiting.  Pt had been on Baclofen for HA, but this did not work historically, and so she no longer takes this medication.  Pt also took some Zofran today, without improvement in nausea.    Patient is a 44 y.o. female presenting with migraines. The history is provided by the patient. No language interpreter was used.  Migraine This is a recurrent problem. The current episode started yesterday. The problem occurs constantly. The problem has been gradually worsening. Associated symptoms include headaches and nausea. Pertinent negatives include no abdominal pain, chest pain, chills, coughing, diaphoresis, fatigue, fever, myalgias, neck pain, numbness, rash, vertigo, visual change, vomiting or weakness. Nothing aggravates the symptoms. She has tried lying down for the symptoms. The treatment provided no relief.    Past Medical History  Diagnosis Date  . Deviated nasal septum 02/2011  . Nasal turbinate hypertrophy 02/2011    bilat.  [name] Headache(784.0)     migraines  . GERD (gastroesophageal reflux disease)   . Bipolar disorder   . Seasonal allergies   . Anxiety   . Depression   . Sleep apnea    Past Surgical History  Procedure Laterality Date  . Tubal ligation  12/16/2004  . Laparoscopic vaginal hysterectomy  03/14/2007  . Foot surgery    . Nasal septoplasty w/ turbinoplasty  02/22/2011    Procedure: NASAL SEPTOPLASTY WITH TURBINATE  REDUCTION;  Surgeon: Ascencion Dike, MD;  Location[place] [facility];  Service: ENT;  Laterality: Bilateral;   Family History  Problem Relation Age of Onset  . ADD / ADHD Sister   . Alcohol abuse Maternal Grandmother   . Bipolar disorder Maternal Grandmother   . Suicidality Neg Hx    History  Substance Use Topics  . Smoking status: Current Every Day Smoker -- 0.50 packs/day for 4 years    Types: Cigarettes  . Smokeless tobacco: Never Used     Comment: 8 cigarettes/day  . Alcohol Use: 0.0 oz/week    0 Shots of liquor per week     Comment: once a month or less   OB History    No data available     Review of Systems  Constitutional: Negative for fever, chills, diaphoresis and fatigue.  Respiratory: Negative for cough and shortness of breath.   Cardiovascular: Negative for chest pain.  Gastrointestinal: Positive for nausea. Negative for vomiting, abdominal pain and diarrhea.  Genitourinary: Negative for dysuria.  Musculoskeletal: Negative for myalgias and neck pain.  Skin: Negative for rash.  Neurological: Positive for headaches. Negative for dizziness, vertigo, syncope, speech difficulty, weakness, light-headedness and numbness.  Hematological: Negative for adenopathy. Does not bruise/bleed easily.  All other systems reviewed and are negative.     Allergies  Aloe; Penicillins; and Sulfa antibiotics  Home Medications   Prior to Admission medications   Medication Sig Start Date End Date Taking? Authorizing Provider  ALPRAZolam Duanne Moron) 0.5 MG tablet Take 1 tablet (0.5 mg total) by mouth at bedtime as needed for anxiety. 11/05/13  Charlcie Cradle, MD  divalproex (DEPAKOTE) 500 MG DR tablet Take 4 tablets (2,000 mg total) by mouth at bedtime. 11/05/13   Charlcie Cradle, MD  esomeprazole (NEXIUM) 40 MG capsule Take 40 mg by mouth 2 (two) times daily.    Historical Provider, MD  levocetirizine (XYZAL) 5 MG tablet Take 5 mg by mouth every evening.    Historical Provider, MD   lithium 300 MG tablet Take 300mg  po qHS for mood lability. 02/27/14   Charlcie Cradle, MD  Lurasidone HCl (LATUDA) 120 MG TABS Take 1 tablet (120 mg total) by mouth 1 day or 1 dose. 11/05/13   Charlcie Cradle, MD  Multiple Vitamin (MULTIVITAMIN) tablet Take 1 tablet by mouth daily.    Historical Provider, MD   BP 124/60 mmHg  Pulse 79  Temp(Src) 98.2 F (36.8 C)  Resp 18  Ht 5\' 4"  (1.626 m)  Wt 205 lb (92.987 kg)  BMI 35.17 kg/m2  SpO2 97% Physical Exam  Constitutional: She is oriented to person, place, and time. She appears well-developed and well-nourished.  HENT:  Head: Normocephalic and atraumatic.  Right Ear: External ear normal.  Left Ear: External ear normal.  Mouth/Throat: Oropharynx is clear and moist.  Eyes: Conjunctivae and EOM are normal. Pupils are equal, round, and reactive to light.  Neck: Normal range of motion. Neck supple.  Cardiovascular: Normal rate, regular rhythm, normal heart sounds and intact distal pulses.   Pulmonary/Chest: Effort normal and breath sounds normal. No respiratory distress. She has no wheezes. She has no rales. She exhibits no tenderness.  Abdominal: Soft. Bowel sounds are normal. She exhibits no distension and no mass. There is no tenderness. There is no rebound and no guarding.  Musculoskeletal: Normal range of motion.  Neurological: She is oriented to person, place, and time.  CN II-XII intact.  No focal motor or sensory deficits on exam of BUE, BLE.  No meningeal signs.    Skin: Skin is warm and dry.  Nursing note and vitals reviewed.   ED Course  Procedures (including critical care time) Labs Review Labs Reviewed - No data to display  Imaging Review No results found.   EKG Interpretation None      MDM   Final diagnoses:  Other migraine without status migrainosus, not intractable   44 yo F hx of HTN, Depression, Anxiety, presents with CC arm, chest pain.    Physical exam as above.  VS WNL.  Pt with c/o headache.  Unlikely  SAH as not rapid onset, severe, non focal neuro exam.  Afebrile, no meningeal signs, thus unlikely meningitis.  No head trauma.  Pt with prior hysterectomy, thus no pregnancy.   No meds taken at home.  Will attempt hydration and HA cocktail and reeval.    On reeval pt states pain is improved 50%.  Pt states she feels like going home to rest.  Asks for a pain pill and antiemetic before leaving.  Pt given a percocet and zofran prior to d/c.   Diagnosis of Migraine headache.   D/c home in good condition.  F/u with PCP in 1 week.  Return precautions given.  Pt understands and agrees with plan.   Sinda Du  Discussed pt with my attending Dr. [name].       Sinda Du, MD 03/01/14 4401  Pamella Pert, MD 03/02/14 [phone]

## 2014-03-01 NOTE — ED Notes (Signed)
Discharge instructions reviewed, voiced understanding.  

## 2014-03-01 NOTE — ED Notes (Signed)
The pt is c/o a migraine headache for 2 days n no vomiting.  She took a zofran.  lmp  None hyst

## 2014-03-04 ENCOUNTER — Encounter (HOSPITAL_COMMUNITY): Payer: Self-pay | Admitting: Psychiatry

## 2014-03-04 ENCOUNTER — Ambulatory Visit (INDEPENDENT_AMBULATORY_CARE_PROVIDER_SITE_OTHER): Payer: Self-pay | Admitting: Psychiatry

## 2014-03-04 VITALS — BP 119/80 | HR 84 | Ht 64.0 in | Wt 212.0 lb

## 2014-03-04 DIAGNOSIS — F3130 Bipolar disorder, current episode depressed, mild or moderate severity, unspecified: Secondary | ICD-10-CM

## 2014-03-04 DIAGNOSIS — F411 Generalized anxiety disorder: Secondary | ICD-10-CM

## 2014-03-04 DIAGNOSIS — F319 Bipolar disorder, unspecified: Secondary | ICD-10-CM

## 2014-03-04 DIAGNOSIS — Z79899 Other long term (current) drug therapy: Secondary | ICD-10-CM

## 2014-03-04 DIAGNOSIS — F3131 Bipolar disorder, current episode depressed, mild: Secondary | ICD-10-CM

## 2014-03-04 DIAGNOSIS — F17200 Nicotine dependence, unspecified, uncomplicated: Secondary | ICD-10-CM

## 2014-03-04 MED ORDER — LITHIUM CARBONATE 300 MG PO TABS: ORAL_TABLET | ORAL | Status: DC

## 2014-03-04 MED ORDER — DIVALPROEX SODIUM 500 MG PO DR TAB: 2000.0000 mg | DELAYED_RELEASE_TABLET | Freq: Every day | ORAL | Status: DC

## 2014-03-04 MED ORDER — ALPRAZOLAM 0.5 MG PO TABS: 0.5000 mg | ORAL_TABLET | Freq: Every evening | ORAL | Status: DC | PRN

## 2014-03-04 NOTE — Progress Notes (Signed)
[facility] [phone] Progress Note  Margaret Lopez [id number] 43 y.o.  03/04/2014 3:41 PM  Chief Complaint: things are ok   History of Present Illness: Pt was off of [place] since October due to insurance.   Reports grandmother was sick and that has been stressful. She is having a lot of stressors. Xanax use increased to 3-4x/week due to high anxiety. Pt is having daily daily racing thoughts, nervousness, SOB and palpitations.   Mood is variable. Depression is at low level on a near daily basis. Her stress level has been high recently. Isolation, worthlessness, hopelessness and anhedonia continue to improve slowly. She is trying hard not to isolate herself but it is not always going well.  Sleep is good with Bipap and she is getting about 8 hrs a night. Energy is ok but she still occasionally naps. Appetite is fair. Concentration is ok but could be better and some days are really bad.   Denies manic and hypomanic symptoms including periods of decreased need for sleep, increased energy, mood lability, impulsivity, FOI, and excessive spending. Denies irritability. No longer engaging in sexual promiscuity.   Pt is taking Depakote, Lithium and Xanax as prescribed and denies SE.    Suicidal Ideation: No Plan Formed: No Patient has means to carry out plan: No  Homicidal Ideation: No Plan Formed: No Patient has means to carry out plan: No  Review of Systems: Psychiatric: Agitation: No Hallucination: No Depressed Mood: Yes Insomnia: No Hypersomnia: No Altered Concentration: Yes Feels Worthless: Yes Grandiose Ideas: No Belief In Special Powers: No New/Increased Substance Abuse: No Compulsions: No  Neurologic: Headache: Yes migraines and pt has been to the [facility] in the past Seizure: No Paresthesias: No  Review of Systems  Constitutional: Negative for fever and chills.  HENT: Negative for congestion and sore throat.   Eyes: Negative for blurred vision,  double vision and redness.  Respiratory: Negative for cough, shortness of breath and wheezing.   Cardiovascular: Positive for palpitations. Negative for chest pain and leg swelling.  Gastrointestinal: Negative for heartburn, nausea, vomiting and abdominal pain.  Musculoskeletal: Negative for back pain, joint pain and neck pain.  Skin: Negative for itching and rash.  Neurological: Positive for headaches. Negative for dizziness, focal weakness, seizures, loss of consciousness and weakness.  Psychiatric/Behavioral: Positive for depression. Negative for suicidal ideas, hallucinations and substance abuse. The patient is nervous/anxious. The patient does not have insomnia.       Past Medical Family, Social History: [name] in own house with her 2 sons. Seperated from husband. Smokes 1/2-1 ppd, denies regular alcohol use. Denies illicit substance abuse.  Family History  Problem Relation Age of Onset  . ADD / ADHD Sister   . Alcohol abuse Maternal Grandmother   . Bipolar disorder Maternal Grandmother   . Suicidality Neg Hx     Past Medical History  Diagnosis Date  . Deviated nasal septum 02/2011  . Nasal turbinate hypertrophy 02/2011    bilat.  [name] Headache(784.0)     migraines  . GERD (gastroesophageal reflux disease)   . Bipolar disorder   . Seasonal allergies   . Anxiety   . Depression   . Sleep apnea     Outpatient Encounter Prescriptions as of 03/04/2014  Medication Sig  . ALPRAZolam (XANAX) 0.5 MG tablet Take 1 tablet (0.5 mg total) by mouth at bedtime as needed for anxiety.  . divalproex (DEPAKOTE) 500 MG DR tablet Take 4 tablets (2,000 mg total) by mouth at bedtime.  [name]  esomeprazole (NEXIUM) 40 MG capsule Take 40 mg by mouth 2 (two) times daily.  [name] lithium 300 MG tablet Take 300mg  po qHS for mood lability.  . ondansetron (ZOFRAN) 4 MG tablet Take 4 mg by mouth every 8 (eight) hours as needed for nausea or vomiting.  . Lurasidone HCl (LATUDA) 120 MG TABS Take 1 tablet (120 mg  total) by mouth 1 day or 1 dose. (Patient not taking: Reported on 03/01/2014)    Past Psychiatric History/Hospitalization(s): Anxiety: Yes Bipolar Disorder: Yes Depression: Yes Mania: Yes Psychosis: No Schizophrenia: No Personality Disorder: No Hospitalization for psychiatric illness: No History of Electroconvulsive Shock Therapy: No Prior Suicide Attempts: No  Physical Exam: Constitutional:  BP 119/80 mmHg  Pulse 84  Ht 5\' 4"  (1.626 m)  Wt 212 lb (96.163 kg)  BMI 36.37 kg/m2  General Appearance: alert, oriented, no acute distress  Musculoskeletal: Strength & Muscle Tone: within normal limits Gait & Station: normal Patient leans: N/A  Mental Status Examination/Evaluation: Objective: Attitude: Calm and cooperative  Appearance: Casual, appears to be stated age  Eye Contact::  Fair  Speech:  Clear and Coherent and Normal Rate  Volume:  Normal  Mood:  Depressed and anxious  Affect:  Constricted  Thought Process:  Linear and Logical  Orientation:  Full (Time, Place, and Person)  Thought Content:  Negative  Suicidal Thoughts:  No  Homicidal Thoughts:  No  Judgement:  Fair  Insight:  Fair  Concentration: good  Memory: Immediate-fair Recent-fair Remote-fair  Recall: fair  Language: fair  Gait and Station: normal  [employer] of Knowledge: average  Psychomotor Activity:  Normal  Akathisia:  No  Handed:  Right  AIMS (if indicated):  Facial and Oral Movements  Muscles of Facial Expression: None, normal  Lips and Perioral Area: None, normal  Jaw: None, normal  Tongue: None, normal Extremity Movements: Upper (arms, wrists, hands, fingers): None, normal  Lower (legs, knees, ankles, toes): None, normal,  Trunk Movements:  Neck, shoulders, hips: None, normal,  Overall Severity : Severity of abnormal movements (highest score from questions above): None, normal  Incapacitation due to abnormal movements: None, normal  Patient's awareness of abnormal movements (rate  only patient's report): No Awareness, Dental Status  Current problems with teeth and/or dentures?: No  Does patient usually wear dentures?: No         Medical Decision Making (Choose Three): Established Problem, Stable/Improving (1), Review of Psycho-Social Stressors (1), Established Problem, Worsening (2), Review of Last Therapy Session (1) and Review of Medication Regimen & Side Effects (2)  Assessment: Axis I: Bipolar- depressed, recurrent episode and mild; GAD, Nicotine Dependence  Axis II: deferred  Axis III:  Past Medical History  Diagnosis Date  . Deviated nasal septum 02/2011  . Nasal turbinate hypertrophy 02/2011    bilat.  [name] Headache(784.0)     migraines  . GERD (gastroesophageal reflux disease)   . Bipolar disorder   . Seasonal allergies   . Anxiety   . Depression   . Sleep apnea      Axis IV: problem with sons, occupational stress, limited coping skills  Axis V: 45   Plan: Continue Depakote 2000mg  po qD for mood stabalization. Labs done on 07/04/13 show 99 Depakote level, LFT WNL D/c Latuda as pt can not afford it Continue Xanax 0.5mg  po qD prn anxiety Continue Lithium 300mg  po qD for mood- labs show Creat and TSH WNL -Pt requires multiple mood stabalizers to keep her mood level. She has shown significantly disruptive  symptoms when treated with only one mood stabalizer. Pt is tolerating the regime without SE.    Medication management with supportive therapy. Risks/benefits and SE of the medication discussed. Pt verbalized understanding and verbal consent obtained for treatment.  Affirm with the patient that the medications are taken as ordered. Patient expressed understanding of how their medications were to be used.  -improvement of depression symptoms and worsening of anxiety. Pt doesn't want meds changed today.   Labs: Depakote level, CBC (platelets), LFT's, Lithium level, CMP with Bun/Creatinine, TSH  Therapy: brief supportive therapy provided. Discussed  psychosocial stressors in detail.    Pt denies SI and is at an acute low risk for suicide.Patient told to call clinic if any problems occur. Patient advised to go to ER  if she should develop SI/HI, side effects, or if symptoms worsen. Has crisis numbers to call if needed. Pt verbalized understanding.  F/up in 3 months or sooner if needed  Charlcie Cradle, MD 03/04/2014

## 2014-03-24 ENCOUNTER — Other Ambulatory Visit (HOSPITAL_COMMUNITY): Payer: Self-pay | Admitting: Psychiatry

## 2014-03-24 DIAGNOSIS — F319 Bipolar disorder, unspecified: Secondary | ICD-10-CM

## 2014-06-14 ENCOUNTER — Encounter (HOSPITAL_COMMUNITY): Payer: Self-pay | Admitting: *Deleted

## 2014-06-14 ENCOUNTER — Emergency Department (HOSPITAL_COMMUNITY)
Admission: EM | Admit: 2014-06-14 | Discharge: 2014-06-14 | Disposition: A | Discharge status: Home / Self Care | Payer: BLUE CROSS/BLUE SHIELD | Attending: Emergency Medicine | Admitting: Emergency Medicine

## 2014-06-14 DIAGNOSIS — F419 Anxiety disorder, unspecified: Secondary | ICD-10-CM | POA: Insufficient documentation

## 2014-06-14 DIAGNOSIS — Z720 Tobacco use: Secondary | ICD-10-CM | POA: Insufficient documentation

## 2014-06-14 DIAGNOSIS — Z79899 Other long term (current) drug therapy: Secondary | ICD-10-CM | POA: Insufficient documentation

## 2014-06-14 DIAGNOSIS — Z880 Allergy status to penicillin: Secondary | ICD-10-CM | POA: Insufficient documentation

## 2014-06-14 DIAGNOSIS — G43809 Other migraine, not intractable, without status migrainosus: Secondary | ICD-10-CM | POA: Insufficient documentation

## 2014-06-14 DIAGNOSIS — K219 Gastro-esophageal reflux disease without esophagitis: Secondary | ICD-10-CM | POA: Insufficient documentation

## 2014-06-14 DIAGNOSIS — Z8709 Personal history of other diseases of the respiratory system: Secondary | ICD-10-CM | POA: Insufficient documentation

## 2014-06-14 DIAGNOSIS — F319 Bipolar disorder, unspecified: Secondary | ICD-10-CM | POA: Insufficient documentation

## 2014-06-14 MED ORDER — SODIUM CHLORIDE 0.9 % IV BOLUS (SEPSIS)
1000.0000 mL | Freq: Once | INTRAVENOUS | Status: AC
  Administered 2014-06-14: 1000 mL via INTRAVENOUS

## 2014-06-14 MED ORDER — METOCLOPRAMIDE HCL 5 MG/ML IJ SOLN
10.0000 mg | Freq: Once | INTRAMUSCULAR | Status: AC
  Administered 2014-06-14: 10 mg via INTRAVENOUS
  Filled 2014-06-14: qty 2

## 2014-06-14 MED ORDER — DIPHENHYDRAMINE HCL 50 MG/ML IJ SOLN
25.0000 mg | Freq: Once | INTRAMUSCULAR | Status: AC
  Administered 2014-06-14: 25 mg via INTRAVENOUS
  Filled 2014-06-14: qty 1

## 2014-06-14 MED ORDER — OXYCODONE-ACETAMINOPHEN 5-325 MG PO TABS
2.0000 | ORAL_TABLET | Freq: Once | ORAL | Status: AC
  Administered 2014-06-14: 2 via ORAL
  Filled 2014-06-14: qty 2

## 2014-06-14 MED ORDER — KETOROLAC TROMETHAMINE 30 MG/ML IJ SOLN
30.0000 mg | Freq: Once | INTRAMUSCULAR | Status: AC
  Administered 2014-06-14: 30 mg via INTRAVENOUS
  Filled 2014-06-14: qty 1

## 2014-06-14 NOTE — Discharge Instructions (Signed)
Follow up with your doctor for further evaluation of your migraines. Refer to attached documents for more information.

## 2014-06-14 NOTE — ED Notes (Signed)
Offered pt percocet at triage, pt declined.

## 2014-06-14 NOTE — ED Provider Notes (Signed)
CSN: [id number]     Arrival date & time 06/14/14  1743 History   First MD Initiated Contact with Patient 06/14/14 2027     Chief Complaint  Patient presents with  . Migraine     (Consider location/radiation/quality/duration/timing/severity/associated sxs/prior Treatment) HPI Comments: Patient is a 44 year old female with a past medical history of migraines who presents with a headache for 3 days. Patient reports a gradual onset and progressive worsening of the headache. The pain is sharp, constant and is located in generalized head without radiation. Patient has tried OTC medicatio for symptoms without relief. No alleviating/aggravating factors. Patient reports associated nausea and photophobia. Patient denies fever, vomiting, diarrhea, numbness/tingling, weakness, visual changes, congestion, chest pain, SOB, abdominal pain.     Patient is a 44 y.o. female presenting with migraines.  Migraine Associated symptoms include headaches. Pertinent negatives include no abdominal pain, arthralgias, chest pain, chills, fatigue, fever, nausea, neck pain, vomiting or weakness.    Past Medical History  Diagnosis Date  . Deviated nasal septum 02/2011  . Nasal turbinate hypertrophy 02/2011    bilat.  [name] Headache(784.0)     migraines  . GERD (gastroesophageal reflux disease)   . Bipolar disorder   . Seasonal allergies   . Anxiety   . Depression   . Sleep apnea    Past Surgical History  Procedure Laterality Date  . Tubal ligation  12/16/2004  . Laparoscopic vaginal hysterectomy  03/14/2007  . Foot surgery    . Nasal septoplasty w/ turbinoplasty  02/22/2011    Procedure: NASAL SEPTOPLASTY WITH TURBINATE REDUCTION;  Surgeon: Ascencion Dike, MD;  Location: [place];  Service: ENT;  Laterality: Bilateral;   Family History  Problem Relation Age of Onset  . ADD / ADHD Sister   . Alcohol abuse Maternal Grandmother   . Bipolar disorder Maternal Grandmother   . Suicidality Neg Hx     History  Substance Use Topics  . Smoking status: Current Every Day Smoker -- 0.50 packs/day for 4 years    Types: Cigarettes  . Smokeless tobacco: Never Used     Comment: 8 cigarettes/day  . Alcohol Use: 0.0 oz/week    0 Shots of liquor per week     Comment: once a month or less   OB History    No data available     Review of Systems  Constitutional: Negative for fever, chills and fatigue.  HENT: Negative for trouble swallowing.   Eyes: Negative for visual disturbance.  Respiratory: Negative for shortness of breath.   Cardiovascular: Negative for chest pain and palpitations.  Gastrointestinal: Negative for nausea, vomiting, abdominal pain and diarrhea.  Genitourinary: Negative for dysuria and difficulty urinating.  Musculoskeletal: Negative for arthralgias and neck pain.  Skin: Negative for color change.  Neurological: Positive for headaches. Negative for dizziness and weakness.  Psychiatric/Behavioral: Negative for dysphoric mood.      Allergies  Aloe; Penicillins; and Sulfa antibiotics  Home Medications   Prior to Admission medications   Medication Sig Start Date End Date Taking? Authorizing Provider  ALPRAZolam Duanne Moron) 0.5 MG tablet Take 1 tablet (0.5 mg total) by mouth at bedtime as needed for anxiety. Patient taking differently: Take 0.125 mg by mouth at bedtime.  03/04/14  Yes Charlcie Cradle, MD  cetirizine (ZYRTEC) 10 MG tablet Take 10 mg by mouth daily.   Yes Historical Provider, MD  divalproex (DEPAKOTE) 500 MG DR tablet Take 4 tablets (2,000 mg total) by mouth at bedtime. 03/04/14  Yes  Charlcie Cradle, MD  esomeprazole (NEXIUM) 20 MG capsule Take 20 mg by mouth at bedtime.   Yes Historical Provider, MD  lithium 300 MG tablet TAKE 1 TABLET BY MOUTH EVERY NIGHT AT BEDTIME FOR MOOD LABILITY 03/25/14  Yes Charlcie Cradle, MD  ondansetron (ZOFRAN) 4 MG tablet Take 4 mg by mouth every 8 (eight) hours as needed for nausea or vomiting.   Yes Historical Provider, MD   tizanidine (ZANAFLEX) 2 MG capsule Take 2 mg by mouth 2 (two) times daily as needed (for migraines).   Yes Historical Provider, MD   BP 131/87 mmHg  Pulse 47  Temp(Src) 98.2 F (36.8 C) (Oral)  Resp 18  SpO2 100% Physical Exam  Constitutional: She is oriented to person, place, and time. She appears well-developed and well-nourished. No distress.  HENT:  Head: Normocephalic and atraumatic.  Eyes: Conjunctivae and EOM are normal.  Neck: Normal range of motion.  Cardiovascular: Normal rate and regular rhythm.  Exam reveals no gallop and no friction rub.   No murmur heard. Pulmonary/Chest: Effort normal and breath sounds normal. She has no wheezes. She has no rales. She exhibits no tenderness.  Abdominal: Soft. She exhibits no distension. There is no tenderness. There is no rebound.  Musculoskeletal: Normal range of motion.  Neurological: She is alert and oriented to person, place, and time. No cranial nerve deficit. Coordination normal.  Speech is goal-oriented. Moves limbs without ataxia.   Skin: Skin is warm and dry.  Psychiatric: She has a normal mood and affect. Her behavior is normal.  Nursing note and vitals reviewed.   ED Course  Procedures (including critical care time) Labs Review Labs Reviewed - No data to display  Imaging Review No results found.   EKG Interpretation None      MDM   Final diagnoses:  Other migraine without status migrainosus, not intractable    11:37 PM Patient feeling better after migraine cocktail. Vitals stable and patient afebrile. Patient will be discharged.   Alvina Chou, PA-C 06/14/14 2358  Ezequiel Essex, MD 06/15/14 [phone]

## 2014-06-14 NOTE — ED Notes (Signed)
Pt reports having migraine for several days, has hx of same and no relief with meds. Having nausea, no vomiting and reports sensitivity to light and sound.

## 2014-08-04 ENCOUNTER — Emergency Department (HOSPITAL_BASED_OUTPATIENT_CLINIC_OR_DEPARTMENT_OTHER)
Admission: EM | Admit: 2014-08-04 | Discharge: 2014-08-04 | Discharge status: Against Medical Advice | Payer: BLUE CROSS/BLUE SHIELD | Attending: Emergency Medicine | Admitting: Emergency Medicine

## 2014-08-04 ENCOUNTER — Encounter (HOSPITAL_BASED_OUTPATIENT_CLINIC_OR_DEPARTMENT_OTHER): Payer: Self-pay | Admitting: Emergency Medicine

## 2014-08-04 ENCOUNTER — Emergency Department (HOSPITAL_COMMUNITY)
Admission: EM | Admit: 2014-08-04 | Discharge: 2014-08-05 | Disposition: A | Discharge status: Home / Self Care | Payer: BLUE CROSS/BLUE SHIELD | Attending: Emergency Medicine | Admitting: Emergency Medicine

## 2014-08-04 ENCOUNTER — Encounter (HOSPITAL_COMMUNITY): Payer: Self-pay | Admitting: Emergency Medicine

## 2014-08-04 DIAGNOSIS — K219 Gastro-esophageal reflux disease without esophagitis: Secondary | ICD-10-CM | POA: Insufficient documentation

## 2014-08-04 DIAGNOSIS — Z720 Tobacco use: Secondary | ICD-10-CM | POA: Insufficient documentation

## 2014-08-04 DIAGNOSIS — G43809 Other migraine, not intractable, without status migrainosus: Secondary | ICD-10-CM | POA: Insufficient documentation

## 2014-08-04 DIAGNOSIS — F419 Anxiety disorder, unspecified: Secondary | ICD-10-CM | POA: Insufficient documentation

## 2014-08-04 DIAGNOSIS — Z79899 Other long term (current) drug therapy: Secondary | ICD-10-CM | POA: Insufficient documentation

## 2014-08-04 DIAGNOSIS — F319 Bipolar disorder, unspecified: Secondary | ICD-10-CM | POA: Insufficient documentation

## 2014-08-04 DIAGNOSIS — Z8709 Personal history of other diseases of the respiratory system: Secondary | ICD-10-CM | POA: Insufficient documentation

## 2014-08-04 DIAGNOSIS — Z880 Allergy status to penicillin: Secondary | ICD-10-CM | POA: Insufficient documentation

## 2014-08-04 DIAGNOSIS — G43909 Migraine, unspecified, not intractable, without status migrainosus: Secondary | ICD-10-CM | POA: Insufficient documentation

## 2014-08-04 MED ORDER — SODIUM CHLORIDE 0.9 % IV BOLUS (SEPSIS)
1000.0000 mL | Freq: Once | INTRAVENOUS | Status: AC
  Administered 2014-08-04: 1000 mL via INTRAVENOUS

## 2014-08-04 MED ORDER — METOCLOPRAMIDE HCL 5 MG/ML IJ SOLN
10.0000 mg | Freq: Once | INTRAMUSCULAR | Status: AC
  Administered 2014-08-04: 10 mg via INTRAVENOUS
  Filled 2014-08-04: qty 2

## 2014-08-04 MED ORDER — KETOROLAC TROMETHAMINE 30 MG/ML IJ SOLN
30.0000 mg | Freq: Once | INTRAMUSCULAR | Status: AC
  Administered 2014-08-04: 30 mg via INTRAVENOUS
  Filled 2014-08-04: qty 1

## 2014-08-04 MED ORDER — DIPHENHYDRAMINE HCL 50 MG/ML IJ SOLN
25.0000 mg | Freq: Once | INTRAMUSCULAR | Status: AC
  Administered 2014-08-04: 25 mg via INTRAVENOUS
  Filled 2014-08-04: qty 1

## 2014-08-04 NOTE — ED Provider Notes (Signed)
CSN: [id number]     Arrival date & time 08/04/14  2124 History   First MD Initiated Contact with Patient 08/04/14 2220     Chief Complaint  Patient presents with  . Migraine     (Consider location/radiation/quality/duration/timing/severity/associated sxs/prior Treatment) HPI Comments: Patient with a history of Migraines presents today with a headache.  She reports that the headache has been intermittent for the past 5 days.  She states that the headache feels similar to her typical migraine headaches.  She states that she has taken Vicodin and Benadryl for the pain with mild temporary relief.  She reports associated nausea, vomiting, and photophobia.  She denies fever, chills, neck pain, or vision changes.  She denies acute head injury or trauma.    The history is provided by the patient.    Past Medical History  Diagnosis Date  . Deviated nasal septum 02/2011  . Nasal turbinate hypertrophy 02/2011    bilat.  [name] Headache(784.0)     migraines  . GERD (gastroesophageal reflux disease)   . Bipolar disorder   . Seasonal allergies   . Anxiety   . Depression   . Sleep apnea    Past Surgical History  Procedure Laterality Date  . Tubal ligation  12/16/2004  . Laparoscopic vaginal hysterectomy  03/14/2007  . Foot surgery    . Nasal septoplasty w/ turbinoplasty  02/22/2011    Procedure: NASAL SEPTOPLASTY WITH TURBINATE REDUCTION;  Surgeon: Ascencion Dike, MD;  Location: [place];  Service: ENT;  Laterality: Bilateral;   Family History  Problem Relation Age of Onset  . ADD / ADHD Sister   . Alcohol abuse Maternal Grandmother   . Bipolar disorder Maternal Grandmother   . Suicidality Neg Hx    History  Substance Use Topics  . Smoking status: Current Every Day Smoker -- 0.50 packs/day for 4 years    Types: Cigarettes  . Smokeless tobacco: Never Used     Comment: 8 cigarettes/day  . Alcohol Use: 0.0 oz/week    0 Shots of liquor per week     Comment: once a month or less    OB History    No data available     Review of Systems  All other systems reviewed and are negative.     Allergies  Aloe; Penicillins; and Sulfa antibiotics  Home Medications   Prior to Admission medications   Medication Sig Start Date End Date Taking? Authorizing Provider  ALPRAZolam Duanne Moron) 0.5 MG tablet Take 1 tablet (0.5 mg total) by mouth at bedtime as needed for anxiety. Patient taking differently: Take 0.125 mg by mouth at bedtime.  03/04/14   Charlcie Cradle, MD  cetirizine (ZYRTEC) 10 MG tablet Take 10 mg by mouth daily.    Historical Provider, MD  divalproex (DEPAKOTE) 500 MG DR tablet Take 4 tablets (2,000 mg total) by mouth at bedtime. 03/04/14   Charlcie Cradle, MD  esomeprazole (NEXIUM) 20 MG capsule Take 20 mg by mouth at bedtime.    Historical Provider, MD  lithium 300 MG tablet TAKE 1 TABLET BY MOUTH EVERY NIGHT AT BEDTIME FOR MOOD LABILITY 03/25/14   Charlcie Cradle, MD  ondansetron (ZOFRAN) 4 MG tablet Take 4 mg by mouth every 8 (eight) hours as needed for nausea or vomiting.    Historical Provider, MD  tizanidine (ZANAFLEX) 2 MG capsule Take 2 mg by mouth 2 (two) times daily as needed (for migraines).    Historical Provider, MD   BP 137/71 mmHg  Pulse 74  Temp(Src) 98.6 F (37 C) (Oral)  Resp 18  SpO2 99% Physical Exam  Constitutional: She appears well-developed and well-nourished.  HENT:  Head: Normocephalic and atraumatic.  Mouth/Throat: Oropharynx is clear and moist.  Eyes: EOM are normal. Pupils are equal, round, and reactive to light.  Neck: Normal range of motion. Neck supple.  Cardiovascular: Normal rate, regular rhythm and normal heart sounds.   Pulmonary/Chest: Effort normal and breath sounds normal.  Musculoskeletal: Normal range of motion.  Neurological: She is alert. She has normal strength. No cranial nerve deficit or sensory deficit. Coordination and gait normal.  Normal finger to nose testing Normal gait, no ataxia  Skin: Skin is warm and  dry.  Psychiatric: She has a normal mood and affect.  Nursing note and vitals reviewed.   ED Course  Procedures (including critical care time) Labs Review Labs Reviewed - No data to display  Imaging Review No results found.   EKG Interpretation None     12:35 AM Reassessed patient.  She reports improvement in her headache at this time. MDM   Final diagnoses:  None   Pt HA treated and improved while in ED.  Presentation is like pts typical HA and non concerning for [facility], [facility], Meningitis, or temporal arteritis. Pt is afebrile with no focal neuro deficits, nuchal rigidity, or change in vision. Pt is to follow up with PCP to discuss prophylactic medication. Pt verbalizes understanding and is agreeable with plan to dc.  Patient stable for discharge.  Return precautions given.     Hyman Bible, PA-C 08/05/14 0129  Daleen Bo, MD 08/05/14 [phone]

## 2014-08-04 NOTE — ED Notes (Signed)
Nurse first-pt states she is leaving-"looks like [facility] only has one pt in the waiting room"-left NAD

## 2014-08-04 NOTE — ED Notes (Signed)
Up to b/r, steady gait, urine sample saved. Alert, NAD, calm, interactive, c/c HA and nausea.

## 2014-08-04 NOTE — ED Notes (Signed)
Migraine since wed with nausea and vomiting, states she has history of migraine

## 2014-08-04 NOTE — ED Notes (Signed)
Pt states she has had a migraine headache since Wednesday  Pt states the pain has gotten so bad she has not been able to eat anything for the past 3 days  Pt states she is having difficulty even drinking liquids  Pt states she has had nausea and vomiting  Pt states she took zofran about 1830 tonight without relief

## 2014-08-05 MED ORDER — OXYCODONE-ACETAMINOPHEN 5-325 MG PO TABS
2.0000 | ORAL_TABLET | Freq: Once | ORAL | Status: AC
  Administered 2014-08-05: 2 via ORAL
  Filled 2014-08-05: qty 2

## 2014-08-05 NOTE — Discharge Instructions (Signed)

## 2014-08-05 NOTE — ED Notes (Signed)
ED PA at [facility] 

## 2014-10-09 ENCOUNTER — Encounter (HOSPITAL_COMMUNITY): Payer: Self-pay | Admitting: Psychiatry

## 2014-10-09 ENCOUNTER — Ambulatory Visit (INDEPENDENT_AMBULATORY_CARE_PROVIDER_SITE_OTHER): Payer: 59 | Admitting: Psychiatry

## 2014-10-09 VITALS — BP 138/86 | HR 75 | Ht 64.0 in | Wt 206.8 lb

## 2014-10-09 DIAGNOSIS — F3130 Bipolar disorder, current episode depressed, mild or moderate severity, unspecified: Secondary | ICD-10-CM | POA: Diagnosis not present

## 2014-10-09 DIAGNOSIS — F411 Generalized anxiety disorder: Secondary | ICD-10-CM | POA: Diagnosis not present

## 2014-10-09 DIAGNOSIS — F319 Bipolar disorder, unspecified: Secondary | ICD-10-CM

## 2014-10-09 DIAGNOSIS — F17200 Nicotine dependence, unspecified, uncomplicated: Secondary | ICD-10-CM | POA: Diagnosis not present

## 2014-10-09 MED ORDER — ALPRAZOLAM 0.5 MG PO TABS: 0.5000 mg | ORAL_TABLET | Freq: Every evening | ORAL | Status: DC | PRN

## 2014-10-09 MED ORDER — LITHIUM CARBONATE ER 300 MG PO TBCR: 300.0000 mg | EXTENDED_RELEASE_TABLET | Freq: Every day | ORAL | Status: DC

## 2014-10-09 MED ORDER — DIVALPROEX SODIUM 500 MG PO DR TAB: 2000.0000 mg | DELAYED_RELEASE_TABLET | Freq: Every day | ORAL | Status: DC

## 2014-10-09 NOTE — Progress Notes (Signed)
Patient ID: Margaret Lopez, female   DOB: [birth year], 44 y.o.   MRN: [id number]  [name] [id number] 44 y.o.  10/09/2014 4:14 PM  Chief Complaint: pretty good  History of Present Illness: Pt states some day she deals with stress ok and other days she doesn't. Pt is not able to identify why.     Xanax 0.125mg  nightly to help her sleep. Pt is having daily racing thoughts, nervousness, SOB and palpitations. Pt has had a few panic attacks recently (3x/month). States they are stress induced.   Depression seems to be better and is managable. She is working on Isolation, worthlessness, hopelessness and anhedonia.  She is trying hard not to isolate herself.     Sleep is good with Bipap and she is getting about 7 hrs a night. Energy is ok but she still occasionally naps. Appetite is fair. Concentration is ok.   Denies manic and hypomanic symptoms including periods of decreased need for sleep, increased energy, mood lability, impulsivity, FOI, and excessive spending. Denies irritability. No longer engaging in sexual promiscuity.   Pt is taking Depakote, Lithium and Xanax as prescribed and denies SE.    Suicidal Ideation: No Plan Formed: No Patient has means to carry out plan: No  Homicidal Ideation: No Plan Formed: No Patient has means to carry out plan: No  Review of Systems: Psychiatric: Agitation: No Hallucination: No Depressed Mood: Yes Insomnia: No Hypersomnia: No Altered Concentration: No Feels Worthless: Yes Grandiose Ideas: No Belief In Special Powers: No New/Increased Substance Abuse: No Compulsions: No  Neurologic: Headache: No  Seizure: No Paresthesias: No  Review of Systems  Constitutional: Negative for fever, chills and weight loss.  HENT: Positive for congestion. Negative for sore throat.   Eyes: Negative for blurred vision, double vision and redness.  Respiratory: Positive for cough. Negative for shortness of  breath and wheezing.   Cardiovascular: Negative for chest pain, palpitations and leg swelling.  Gastrointestinal: Negative for heartburn, nausea, vomiting and abdominal pain.  Musculoskeletal: Negative for back pain, joint pain and neck pain.  Skin: Negative for itching and rash.  Neurological: Negative for dizziness, focal weakness, seizures, loss of consciousness, weakness and headaches.  Psychiatric/Behavioral: Negative for depression, suicidal ideas, hallucinations and substance abuse. The patient is nervous/anxious. The patient does not have insomnia.       Past Medical Family, Social History: Margaret Lopez in own house with her 2 sons. Seperated from husband. Smokes 1/2-1 ppd, denies regular alcohol use. Denies illicit substance abuse.  Family History  Problem Relation Age of Onset  . ADD / ADHD Sister   . Alcohol abuse Maternal Grandmother   . Bipolar disorder Maternal Grandmother   . Suicidality Neg Hx     Past Medical History  Diagnosis Date  . Deviated nasal septum 02/2011  . Nasal turbinate hypertrophy 02/2011    bilat.  [name] Headache(784.0)     migraines  . GERD (gastroesophageal reflux disease)   . Bipolar disorder   . Seasonal allergies   . Anxiety   . Depression   . Sleep apnea     Outpatient Encounter Prescriptions as of 10/09/2014  Medication Sig  . ALPRAZolam (XANAX) 0.5 MG tablet Take 1 tablet (0.5 mg total) by mouth at bedtime as needed for anxiety. (Patient taking differently: Take 0.125 mg by mouth at bedtime. )  . divalproex (DEPAKOTE) 500 MG DR tablet Take 4 tablets (2,000 mg total) by mouth at bedtime.  [name] esomeprazole ([place])  20 MG capsule Take 20 mg by mouth 2 (two) times daily.   [name] lithium carbonate (LITHOBID) 300 MG CR tablet Take 1 tablet by mouth at bedtime.  . ondansetron (ZOFRAN) 4 MG tablet Take 4 mg by mouth every 8 (eight) hours as needed for nausea or vomiting.  . tizanidine (ZANAFLEX) 2 MG capsule Take 2 mg by mouth 2 (two) times daily as needed  (for migraines).  . cetirizine (ZYRTEC) 10 MG tablet Take 10 mg by mouth daily.  [name] HYDROcodone-acetaminophen (NORCO) 10-325 MG per tablet Take 1 tablet by mouth once.  . lithium 300 MG tablet TAKE 1 TABLET BY MOUTH EVERY NIGHT AT BEDTIME FOR MOOD LABILITY (Patient not taking: Reported on 08/04/2014)   No facility-administered encounter medications on file as of 10/09/2014.    Past Psychiatric History/Hospitalization(s): Anxiety: Yes Bipolar Disorder: Yes Depression: Yes Mania: Yes Psychosis: No Schizophrenia: No Personality Disorder: No Hospitalization for psychiatric illness: No History of Electroconvulsive Shock Therapy: No Prior Suicide Attempts: No  Physical Exam: Constitutional:  BP 138/86 mmHg  Pulse 75  Ht 5\' 4"  (1.626 m)  Wt 206 lb 12.8 oz (93.804 kg)  BMI 35.48 kg/m2  General Appearance: alert, oriented, no acute distress  Musculoskeletal: Strength & Muscle Tone: within normal limits Gait & Station: normal Patient leans: N/A  Mental Status Examination/Evaluation: Objective: Attitude: Calm and cooperative  Appearance: Casual, appears to be stated age  Eye Contact::  Fair  Speech:  Clear and Coherent and Normal Rate  Volume:  Normal  Mood:  euhtymic  Affect:  Constricted  Thought Process:  Linear and Logical  Orientation:  Full (Time, Place, and Person)  Thought Content:  Negative  Suicidal Thoughts:  No  Homicidal Thoughts:  No  Judgement:  Fair  Insight:  Fair  Concentration: good  Memory: Immediate-fair Recent-fair Remote-fair  Recall: fair  Language: fair  Gait and Station: normal  [employer] of Knowledge: average  Psychomotor Activity:  Normal  Akathisia:  No  Handed:  Right  AIMS (if indicated):  Facial and Oral Movements  Muscles of Facial Expression: None, normal  Lips and Perioral Area: None, normal  Jaw: None, normal  Tongue: None, normal Extremity Movements: Upper (arms, wrists, hands, fingers): None, normal  Lower (legs, knees,  ankles, toes): None, normal,  Trunk Movements:  Neck, shoulders, hips: None, normal,  Overall Severity : Severity of abnormal movements (highest score from questions above): None, normal  Incapacitation due to abnormal movements: None, normal  Patient's awareness of abnormal movements (rate only patient's report): No Awareness, Dental Status  Current problems with teeth and/or dentures?: No  Does patient usually wear dentures?: No         Medical Decision Making (Choose Three): Established Problem, Stable/Improving (1), Review of Psycho-Social Stressors (1) and Review of Medication Regimen & Side Effects (2)  Assessment: Axis I: Bipolar- depressed, recurrent episode and mild; GAD, Nicotine Dependence  Axis II: deferred  Axis III:  Past Medical History  Diagnosis Date  . Deviated nasal septum 02/2011  . Nasal turbinate hypertrophy 02/2011    bilat.  [name] Headache(784.0)     migraines  . GERD (gastroesophageal reflux disease)   . Bipolar disorder   . Seasonal allergies   . Anxiety   . Depression   . Sleep apnea      Axis IV: problem with sons, occupational stress, limited coping skills  Axis V: 19   Plan: Continue Depakote 2000mg  po qD for mood stabalization. Labs done on 07/04/13 show 99  Depakote level, LFT WNL Continue Xanax 0.5mg  po qD prn anxiety Continue Lithium CR 300mg  po qD for mood- labs show Creat and TSH WNL -Pt requires multiple mood stabalizers to keep her mood level. She has shown significantly disruptive symptoms when treated with only one mood stabalizer. Pt is tolerating the regime without SE.    Medication management with supportive therapy. Risks/benefits and SE of the medication discussed. Pt verbalized understanding and verbal consent obtained for treatment.  Affirm with the patient that the medications are taken as ordered. Patient expressed understanding of how their medications were to be used.  -improvement of depression symptoms and worsening of  anxiety. Pt doesn't want meds changed today.   Labs: pt will send in copy of recent labs  Therapy: brief supportive therapy provided. Discussed psychosocial stressors in detail.    Pt denies SI and is at an acute low risk for suicide.Patient told to call clinic if any problems occur. Patient advised to go to ER  if she should develop SI/HI, side effects, or if symptoms worsen. Has crisis numbers to call if needed. Pt verbalized understanding.  F/up in 3 months or sooner if needed  Charlcie Cradle, MD 10/09/2014

## 2014-11-21 ENCOUNTER — Ambulatory Visit (INDEPENDENT_AMBULATORY_CARE_PROVIDER_SITE_OTHER): Payer: 59 | Admitting: Podiatry

## 2014-11-21 ENCOUNTER — Encounter: Payer: Self-pay | Admitting: Podiatry

## 2014-11-21 VITALS — BP 121/70 | HR 72 | Resp 16

## 2014-11-21 DIAGNOSIS — B351 Tinea unguium: Secondary | ICD-10-CM

## 2014-11-21 DIAGNOSIS — L600 Ingrowing nail: Secondary | ICD-10-CM

## 2014-11-21 MED ORDER — TERBINAFINE HCL 250 MG PO TABS: ORAL_TABLET | ORAL | Status: DC

## 2014-11-21 MED ORDER — HYDROCODONE-ACETAMINOPHEN 10-325 MG PO TABS: 1.0000 | ORAL_TABLET | Freq: Three times a day (TID) | ORAL | Status: DC | PRN

## 2014-11-21 NOTE — Patient Instructions (Signed)

## 2014-11-21 NOTE — Progress Notes (Signed)
   Subjective:    Patient ID: Margaret Lopez, female    DOB: August 30, 1970, 44 y.o.   MRN: [id number]  HPI Comments: "I have a sore nail and a rash on this foot too"  Patient presents with: Toe Pain: 1st toe right - both borders (medial > lateral) - tender for 3 months, had it trimmed during pedicure, red, swollen-tried pushing the skin back away from nail and clip nail some Skin Problem: Dorsal foot right - itching, red spots for few months, thinks they are chiggers, rash isn't going away.    Toe Pain       Review of Systems  Skin: Positive for rash and wound.  Allergic/Immunologic: Positive for environmental allergies.  Neurological: Positive for headaches.  All other systems reviewed and are negative.      Objective:   Physical Exam        Assessment & Plan:

## 2014-11-23 NOTE — Progress Notes (Signed)
Subjective:     Patient ID: Margaret Lopez, female   DOB: [birth year], 44 y.o.   MRN: [id number]  HPI patient presents with ingrown toenail deformity medial over lateral border for 3 months right foot. States that she's had it trimmed during pedicure that's not working and a turn red after that visit   Review of Systems  All other systems reviewed and are negative.      Objective:   Physical Exam  Constitutional: She is oriented to person, place, and time.  Cardiovascular: Intact distal pulses.   Musculoskeletal: Normal range of motion.  Neurological: She is oriented to person, place, and time.  Skin: Skin is warm.  Nursing note and vitals reviewed.  neurovascular status intact muscle strength adequate range of motion within normal limits with patient found to have an incurvated medial border right big toe and mildly on the lateral border. It is sore on the distal corner and red but no drainage is noted and she is noted to be well oriented with good digital perfusion     Assessment:     Ingrown toenail deformity right hallux medial border with pain    Plan:     H&P and condition reviewed with patient. I've recommended removal of the nail corner and she wants this done and I explained procedure and risk. Today I infiltrated the right hallux 60 mg like Marcaine mixture remove the medial border exposed the matrix and applied phenol 3 applications 30 seconds followed by alcohol lavage and sterile dressing. Gave instructions on soaks and reappoint

## 2014-11-25 ENCOUNTER — Telehealth: Payer: Self-pay | Admitting: *Deleted

## 2014-11-25 NOTE — Telephone Encounter (Signed)
Called patient at [phone] (Home #) to check to see how they were doing from their ingrown toenail procedure that was performed on Friday, November 21, 2014. Pt stated, "Toe has been swollen and bruised, but is doing better today". Pt is soaking their toe with some relief.

## 2014-11-27 ENCOUNTER — Telehealth: Payer: Self-pay | Admitting: *Deleted

## 2014-11-27 NOTE — Telephone Encounter (Signed)
Pt states had a toenail procedure and the toe still throbs has to take 1/2 a Vicodin at night.  I told pt to do epsom salt soaks 2x day for about on week as directed on the epsom salt box, and use antibiotic ointment as directed with the antbacterial soap cleanses then begin the antibacterial soap cleanses again until the area develops a dry hard scab, may use antiinflammatory OTC medication to help with the discomfort.  Pt states understanding.

## 2014-12-16 ENCOUNTER — Ambulatory Visit
Admission: RE | Admit: 2014-12-16 | Discharge: 2014-12-16 | Disposition: A | Discharge status: Home / Self Care | Payer: 59 | Source: Ambulatory Visit | Attending: Internal Medicine | Admitting: Internal Medicine

## 2014-12-16 ENCOUNTER — Other Ambulatory Visit: Payer: Self-pay | Admitting: Internal Medicine

## 2014-12-16 DIAGNOSIS — J9801 Acute bronchospasm: Secondary | ICD-10-CM

## 2014-12-31 ENCOUNTER — Telehealth (HOSPITAL_COMMUNITY): Payer: Self-pay

## 2014-12-31 DIAGNOSIS — F319 Bipolar disorder, unspecified: Secondary | ICD-10-CM

## 2014-12-31 DIAGNOSIS — F3130 Bipolar disorder, current episode depressed, mild or moderate severity, unspecified: Secondary | ICD-10-CM

## 2014-12-31 NOTE — Telephone Encounter (Signed)
Telephone message left for patient this nurse received her call that she had to cancel appointment from 01/06/15 and reschedule to 01/29/15 due to insurance problems and requests a one time refill of Depakote and Lithium until she returns 01/29/15 with Dr. Doyne Keel.  Informed patient on message this nurse would send request to Dr. Doyne Keel and would contact her back once discussed.

## 2015-01-01 ENCOUNTER — Ambulatory Visit (HOSPITAL_COMMUNITY): Payer: Self-pay | Admitting: Psychiatry

## 2015-01-01 MED ORDER — DIVALPROEX SODIUM 500 MG PO DR TAB: 2000.0000 mg | DELAYED_RELEASE_TABLET | Freq: Every day | ORAL | Status: DC

## 2015-01-01 MED ORDER — LITHIUM CARBONATE ER 300 MG PO TBCR: 300.0000 mg | EXTENDED_RELEASE_TABLET | Freq: Every day | ORAL | Status: DC

## 2015-01-01 NOTE — Telephone Encounter (Signed)
New orders for patient's Lithium Carbonate and Depakote e-scribed to patient's [facility] on [address] as authorized by Dr. Doyne Keel for one time refills with instruction evaluation required for further refills.  Called patient to inform both her Lithium and Depakote orders were approved and e-scribed to her [facility] for one time refills per Dr. Doyne Keel approval and reminded patient of need to keep next evaluation on 01/29/15 and patient stated she understood and would keep newly scheduled appointment.

## 2015-01-01 NOTE — Telephone Encounter (Signed)
Yes we can do time refill

## 2015-01-29 ENCOUNTER — Encounter (HOSPITAL_COMMUNITY): Payer: Self-pay | Admitting: Psychiatry

## 2015-01-29 ENCOUNTER — Ambulatory Visit (INDEPENDENT_AMBULATORY_CARE_PROVIDER_SITE_OTHER): Payer: BLUE CROSS/BLUE SHIELD | Admitting: Psychiatry

## 2015-01-29 VITALS — BP 131/84 | HR 76 | Ht 64.0 in | Wt 222.4 lb

## 2015-01-29 DIAGNOSIS — F3130 Bipolar disorder, current episode depressed, mild or moderate severity, unspecified: Secondary | ICD-10-CM | POA: Diagnosis not present

## 2015-01-29 DIAGNOSIS — F319 Bipolar disorder, unspecified: Secondary | ICD-10-CM

## 2015-01-29 DIAGNOSIS — F411 Generalized anxiety disorder: Secondary | ICD-10-CM

## 2015-01-29 DIAGNOSIS — F17200 Nicotine dependence, unspecified, uncomplicated: Secondary | ICD-10-CM

## 2015-01-29 MED ORDER — ALPRAZOLAM 0.5 MG PO TABS: 0.5000 mg | ORAL_TABLET | Freq: Every evening | ORAL | Status: DC | PRN

## 2015-01-29 MED ORDER — LITHIUM CARBONATE ER 450 MG PO TBCR: 450.0000 mg | EXTENDED_RELEASE_TABLET | Freq: Every day | ORAL | Status: DC

## 2015-01-29 MED ORDER — DIVALPROEX SODIUM 500 MG PO DR TAB: 2000.0000 mg | DELAYED_RELEASE_TABLET | Freq: Every day | ORAL | Status: DC

## 2015-01-29 NOTE — Progress Notes (Signed)
[facility] [id number] Progress Note  Margaret Lopez [id number] 45 y.o.  01/29/2015 8:47 AM  Chief Complaint: anxiety is worse  History of Present Illness: Reports her stress is very high. She is having stress induced panic attacks about 3x/week. Taking Xanax 0.125mg  nightly to help her sleep or during the day if anxiety is really bad. Pt is having daily racing thoughts, nervousness, SOB and palpitations. Pt has hives on her stomach that come and go due to anxiety.  States it has to do with her ex and fighting for custody for her kids.   Depression seems to be worse. She is more tearful but she tires to control it as much as possible.  She is working on Isolation, worthlessness, hopelessness and anhedonia.  She is trying hard not to isolate herself.  It is struggle.   Sleep is good with Bipap and she is getting about 7 hrs a night. Energy is low. Appetite is fair. Concentration is ok.   Denies manic and hypomanic symptoms including periods of decreased need for sleep, increased energy, mood lability, impulsivity, FOI, and excessive spending. Denies irritability. No longer engaging in sexual promiscuity.   Pt is taking Depakote, Lithium and Xanax as prescribed and denies SE.    Suicidal Ideation: No Plan Formed: No Patient has means to carry out plan: No  Homicidal Ideation: No Plan Formed: No Patient has means to carry out plan: No  Review of Systems: Psychiatric: Agitation: No Hallucination: No Depressed Mood: Yes Insomnia: No Hypersomnia: No Altered Concentration: No Feels Worthless: Yes Grandiose Ideas: No Belief In Special Powers: No New/Increased Substance Abuse: No Compulsions: No  Neurologic: Headache: No  Seizure: No Paresthesias: No  Review of Systems  Constitutional: Negative for fever, chills and weight loss.  HENT: Positive for congestion. Negative for sore throat.   Eyes: Negative for blurred vision, double vision and redness.  Respiratory: Positive  for cough. Negative for shortness of breath and wheezing.   Cardiovascular: Negative for chest pain, palpitations and leg swelling.  Gastrointestinal: Negative for heartburn, nausea, vomiting and abdominal pain.  Musculoskeletal: Negative for back pain, joint pain and neck pain.  Skin: Positive for rash. Negative for itching.  Neurological: Negative for dizziness, tremors, focal weakness, seizures, loss of consciousness, weakness and headaches.  Psychiatric/Behavioral: Positive for depression. Negative for suicidal ideas, hallucinations and substance abuse. The patient is nervous/anxious. The patient does not have insomnia.       Past Medical Family, Social History: [name] in own house with her 2 sons. Seperated from husband. Smokes 1/2-1 ppd, denies regular alcohol use. Denies illicit substance abuse.  Family History  Problem Relation Age of Onset  . ADD / ADHD Sister   . Alcohol abuse Maternal Grandmother   . Bipolar disorder Maternal Grandmother   . Suicidality Neg Hx     Past Medical History  Diagnosis Date  . Deviated nasal septum 02/2011  . Nasal turbinate hypertrophy 02/2011    bilat.  [name] Headache(784.0)     migraines  . GERD (gastroesophageal reflux disease)   . Bipolar disorder ([place])   . Seasonal allergies   . Anxiety   . Depression   . Sleep apnea     Outpatient Encounter Prescriptions as of 01/29/2015  Medication Sig  . ALPRAZolam (XANAX) 0.5 MG tablet Take 1 tablet (0.5 mg total) by mouth at bedtime as needed for anxiety.  . cetirizine (ZYRTEC) 10 MG tablet Take 10 mg by mouth daily.  . divalproex (DEPAKOTE) 500 MG DR  tablet Take 4 tablets (2,000 mg total) by mouth at bedtime.  [name] esomeprazole (NEXIUM) 20 MG capsule Take 20 mg by mouth 2 (two) times daily.   [name] HYDROcodone-acetaminophen (NORCO) 10-325 MG tablet Take 1 tablet by mouth every 8 (eight) hours as needed.  . lithium carbonate (LITHOBID) 300 MG CR tablet Take 1 tablet (300 mg total) by mouth at bedtime.   . ondansetron (ZOFRAN) 4 MG tablet Take 4 mg by mouth every 8 (eight) hours as needed for nausea or vomiting.  . terbinafine (LAMISIL) 250 MG tablet Take one tablet once daily x 7 days, then repeat every month x 4 months   No facility-administered encounter medications on file as of 01/29/2015.    Past Psychiatric History/Hospitalization(s): Anxiety: Yes Bipolar Disorder: Yes Depression: Yes Mania: Yes Psychosis: No Schizophrenia: No Personality Disorder: No Hospitalization for psychiatric illness: No History of Electroconvulsive Shock Therapy: No Prior Suicide Attempts: No  Physical Exam: Constitutional:  BP 131/84 mmHg  Pulse 76  Ht 5\' 4"  (1.626 m)  Wt 222 lb 6.4 oz (100.88 kg)  BMI 38.16 kg/m2  General Appearance: alert, oriented, no acute distress  Musculoskeletal: Strength & Muscle Tone: within normal limits Gait & Station: normal Patient leans: N/A  Mental Status Examination/Evaluation: Objective: Attitude: Calm and cooperative  Appearance: Casual, appears to be stated age  Eye Contact::  Fair  Speech:  Clear and Coherent and Normal Rate  Volume:  Normal  Mood:  Anxious and depressed  Affect:  Constricted  Thought Process:  Linear and Logical  Orientation:  Full (Time, Place, and Person)  Thought Content:  Negative  Suicidal Thoughts:  No  Homicidal Thoughts:  No  Judgement:  Fair  Insight:  Fair  Concentration: good  Memory: Immediate-fair Recent-fair Remote-fair  Recall: fair  Language: fair  Gait and Station: normal  [employer] of Knowledge: average  Psychomotor Activity:  Normal  Akathisia:  No  Handed:  Right  AIMS (if indicated):  Facial and Oral Movements  Muscles of Facial Expression: None, normal  Lips and Perioral Area: None, normal  Jaw: None, normal  Tongue: None, normal Extremity Movements: Upper (arms, wrists, hands, fingers): None, normal  Lower (legs, knees, ankles, toes): None, normal,  Trunk Movements:  Neck, shoulders,  hips: None, normal,  Overall Severity : Severity of abnormal movements (highest score from questions above): None, normal  Incapacitation due to abnormal movements: None, normal  Patient's awareness of abnormal movements (rate only patient's report): No Awareness, Dental Status  Current problems with teeth and/or dentures?: No  Does patient usually wear dentures?: No    Assets: Publishing copy Desire for improvement      Medical Decision Making (Choose Three): Established Problem, Stable/Improving (1), Review of Psycho-Social Stressors (1), Review or order clinical lab tests (1), Established Problem, Worsening (2), Review of Medication Regimen & Side Effects (2) and Review of New Medication or Change in Dosage (2)  Assessment: Axis I: Bipolar- depressed, recurrent episode and mild; GAD, Nicotine Dependence  Axis II: deferred     Plan: Continue Depakote 2000mg  po qD for mood stabalization.  Continue Xanax 0.5mg  po qD prn anxiety increase Lithium CR 450mg  po qD for mood -Pt requires multiple mood stabalizers to keep her mood level. She has shown significantly disruptive symptoms when treated with only one mood stabalizer. Pt is tolerating the regime without SE.    Medication management with supportive therapy. Risks/benefits and SE of the medication discussed. Pt verbalized understanding and verbal consent obtained for treatment.  Affirm with the patient that the medications are taken as ordered. Patient expressed understanding of how their medications were to be used.  -improvement of depression symptoms and worsening of anxiety. Pt doesn't want meds changed today.   Labs: 08/13/2014 Valproic acid 88, lithium 0.4, TSH 2.51, CMP WNL, CBC WNL  Therapy: brief supportive therapy provided. Discussed psychosocial stressors in detail.    Pt denies SI and is at an acute low risk for suicide.Patient told to call clinic if any problems occur. Patient advised to go to ER  if she  should develop SI/HI, side effects, or if symptoms worsen. Has crisis numbers to call if needed. Pt verbalized understanding.  F/up in 2 months or sooner if needed  Charlcie Cradle, MD 01/29/2015

## 2015-02-13 ENCOUNTER — Other Ambulatory Visit (INDEPENDENT_AMBULATORY_CARE_PROVIDER_SITE_OTHER): Payer: Self-pay | Admitting: Otolaryngology

## 2015-02-13 DIAGNOSIS — J0101 Acute recurrent maxillary sinusitis: Secondary | ICD-10-CM

## 2015-02-18 ENCOUNTER — Ambulatory Visit
Admission: RE | Admit: 2015-02-18 | Discharge: 2015-02-18 | Disposition: A | Discharge status: Home / Self Care | Payer: BLUE CROSS/BLUE SHIELD | Source: Ambulatory Visit | Attending: Otolaryngology | Admitting: Otolaryngology

## 2015-02-18 DIAGNOSIS — J0101 Acute recurrent maxillary sinusitis: Secondary | ICD-10-CM

## 2015-02-19 ENCOUNTER — Other Ambulatory Visit: Payer: Self-pay | Admitting: Otolaryngology

## 2015-02-23 ENCOUNTER — Encounter (HOSPITAL_BASED_OUTPATIENT_CLINIC_OR_DEPARTMENT_OTHER): Payer: Self-pay | Admitting: *Deleted

## 2015-02-23 ENCOUNTER — Other Ambulatory Visit: Payer: Self-pay

## 2015-03-02 ENCOUNTER — Ambulatory Visit (HOSPITAL_BASED_OUTPATIENT_CLINIC_OR_DEPARTMENT_OTHER): Payer: BLUE CROSS/BLUE SHIELD | Admitting: Anesthesiology

## 2015-03-02 ENCOUNTER — Encounter (HOSPITAL_BASED_OUTPATIENT_CLINIC_OR_DEPARTMENT_OTHER)
Admission: RE | Disposition: A | Discharge status: Home / Self Care | Payer: Self-pay | Source: Ambulatory Visit | Attending: Otolaryngology

## 2015-03-02 ENCOUNTER — Ambulatory Visit (HOSPITAL_BASED_OUTPATIENT_CLINIC_OR_DEPARTMENT_OTHER)
Admission: RE | Admit: 2015-03-02 | Discharge: 2015-03-02 | Disposition: A | Discharge status: Home / Self Care | Payer: BLUE CROSS/BLUE SHIELD | Source: Ambulatory Visit | Attending: Otolaryngology | Admitting: Otolaryngology

## 2015-03-02 ENCOUNTER — Encounter (HOSPITAL_BASED_OUTPATIENT_CLINIC_OR_DEPARTMENT_OTHER): Payer: Self-pay | Admitting: Anesthesiology

## 2015-03-02 DIAGNOSIS — E669 Obesity, unspecified: Secondary | ICD-10-CM | POA: Diagnosis not present

## 2015-03-02 DIAGNOSIS — Z6838 Body mass index (BMI) 38.0-38.9, adult: Secondary | ICD-10-CM | POA: Diagnosis not present

## 2015-03-02 DIAGNOSIS — H6593 Unspecified nonsuppurative otitis media, bilateral: Secondary | ICD-10-CM | POA: Diagnosis present

## 2015-03-02 DIAGNOSIS — J310 Chronic rhinitis: Secondary | ICD-10-CM | POA: Diagnosis not present

## 2015-03-02 DIAGNOSIS — H6983 Other specified disorders of Eustachian tube, bilateral: Secondary | ICD-10-CM | POA: Insufficient documentation

## 2015-03-02 DIAGNOSIS — Z880 Allergy status to penicillin: Secondary | ICD-10-CM | POA: Diagnosis not present

## 2015-03-02 DIAGNOSIS — Z79899 Other long term (current) drug therapy: Secondary | ICD-10-CM | POA: Insufficient documentation

## 2015-03-02 DIAGNOSIS — Z882 Allergy status to sulfonamides status: Secondary | ICD-10-CM | POA: Diagnosis not present

## 2015-03-02 DIAGNOSIS — K219 Gastro-esophageal reflux disease without esophagitis: Secondary | ICD-10-CM | POA: Insufficient documentation

## 2015-03-02 DIAGNOSIS — F319 Bipolar disorder, unspecified: Secondary | ICD-10-CM | POA: Insufficient documentation

## 2015-03-02 DIAGNOSIS — H900 Conductive hearing loss, bilateral: Secondary | ICD-10-CM | POA: Diagnosis not present

## 2015-03-02 DIAGNOSIS — F17210 Nicotine dependence, cigarettes, uncomplicated: Secondary | ICD-10-CM | POA: Insufficient documentation

## 2015-03-02 DIAGNOSIS — G4730 Sleep apnea, unspecified: Secondary | ICD-10-CM | POA: Insufficient documentation

## 2015-03-02 HISTORY — PX: MYRINGOTOMY WITH TUBE PLACEMENT: SHX5663

## 2015-03-02 SURGERY — MYRINGOTOMY WITH TUBE PLACEMENT
Anesthesia: General | Site: Ear | Laterality: Bilateral

## 2015-03-02 MED ORDER — PROPOFOL 10 MG/ML IV BOLUS
INTRAVENOUS | Status: DC | PRN
  Administered 2015-03-02: 200 mg via INTRAVENOUS

## 2015-03-02 MED ORDER — OXYMETAZOLINE HCL 0.05 % NA SOLN
NASAL | Status: DC | PRN
  Administered 2015-03-02: 1

## 2015-03-02 MED ORDER — LIDOCAINE HCL (CARDIAC) 20 MG/ML IV SOLN
INTRAVENOUS | Status: AC
  Filled 2015-03-02: qty 5

## 2015-03-02 MED ORDER — PROPOFOL 10 MG/ML IV BOLUS
INTRAVENOUS | Status: AC
  Filled 2015-03-02: qty 40

## 2015-03-02 MED ORDER — FENTANYL CITRATE (PF) 100 MCG/2ML IJ SOLN
25.0000 ug | INTRAMUSCULAR | Status: DC | PRN
  Administered 2015-03-02: 50 ug via INTRAVENOUS
  Administered 2015-03-02: 25 ug via INTRAVENOUS

## 2015-03-02 MED ORDER — FENTANYL CITRATE (PF) 100 MCG/2ML IJ SOLN
50.0000 ug | INTRAMUSCULAR | Status: DC | PRN
  Administered 2015-03-02: 50 ug via INTRAVENOUS

## 2015-03-02 MED ORDER — ATROPINE SULFATE 0.4 MG/ML IJ SOLN
INTRAMUSCULAR | Status: AC
  Filled 2015-03-02: qty 1

## 2015-03-02 MED ORDER — HYDROCODONE-ACETAMINOPHEN 7.5-325 MG PO TABS: 1.0000 | ORAL_TABLET | Freq: Once | ORAL | Status: DC | PRN

## 2015-03-02 MED ORDER — ONDANSETRON 8 MG PO TBDP
8.0000 mg | ORAL_TABLET | Freq: Once | ORAL | Status: AC
  Administered 2015-03-02: 8 mg via ORAL

## 2015-03-02 MED ORDER — MEPERIDINE HCL 25 MG/ML IJ SOLN: 6.2500 mg | INTRAMUSCULAR | Status: DC | PRN

## 2015-03-02 MED ORDER — FENTANYL CITRATE (PF) 100 MCG/2ML IJ SOLN
INTRAMUSCULAR | Status: AC
  Filled 2015-03-02: qty 2

## 2015-03-02 MED ORDER — ONDANSETRON HCL 4 MG/2ML IJ SOLN
INTRAMUSCULAR | Status: AC
  Filled 2015-03-02: qty 2

## 2015-03-02 MED ORDER — SUCCINYLCHOLINE CHLORIDE 20 MG/ML IJ SOLN
INTRAMUSCULAR | Status: AC
  Filled 2015-03-02: qty 1

## 2015-03-02 MED ORDER — LIDOCAINE HCL (CARDIAC) 20 MG/ML IV SOLN
INTRAVENOUS | Status: DC | PRN
  Administered 2015-03-02: 50 mg via INTRAVENOUS

## 2015-03-02 MED ORDER — MIDAZOLAM HCL 2 MG/2ML IJ SOLN
INTRAMUSCULAR | Status: AC
  Filled 2015-03-02: qty 2

## 2015-03-02 MED ORDER — CIPROFLOXACIN-DEXAMETHASONE 0.3-0.1 % OT SUSP
OTIC | Status: AC
  Filled 2015-03-02: qty 7.5

## 2015-03-02 MED ORDER — SCOPOLAMINE 1 MG/3DAYS TD PT72: 1.0000 | MEDICATED_PATCH | Freq: Once | TRANSDERMAL | Status: DC | PRN

## 2015-03-02 MED ORDER — LACTATED RINGERS IV SOLN
INTRAVENOUS | Status: DC
  Administered 2015-03-02: 08:00:00 via INTRAVENOUS

## 2015-03-02 MED ORDER — MIDAZOLAM HCL 2 MG/2ML IJ SOLN
1.0000 mg | INTRAMUSCULAR | Status: DC | PRN
  Administered 2015-03-02: 2 mg via INTRAVENOUS

## 2015-03-02 MED ORDER — GLYCOPYRROLATE 0.2 MG/ML IJ SOLN: 0.2000 mg | Freq: Once | INTRAMUSCULAR | Status: DC | PRN

## 2015-03-02 MED ORDER — CIPROFLOXACIN-DEXAMETHASONE 0.3-0.1 % OT SUSP
OTIC | Status: DC | PRN
  Administered 2015-03-02: 4 [drp] via OTIC

## 2015-03-02 MED ORDER — ONDANSETRON 8 MG PO TBDP
ORAL_TABLET | ORAL | Status: AC
  Filled 2015-03-02: qty 1

## 2015-03-02 MED ORDER — METOCLOPRAMIDE HCL 5 MG/ML IJ SOLN: 10.0000 mg | Freq: Once | INTRAMUSCULAR | Status: DC | PRN

## 2015-03-02 SURGICAL SUPPLY — 15 items
ASP/CLT FLD ANG ADJ TUBE STRL (MISCELLANEOUS)
ASPIRATOR COLLECTOR MID EAR (MISCELLANEOUS) IMPLANT
BALL CTTN LRG ABS STRL LF (GAUZE/BANDAGES/DRESSINGS) ×1
BLADE MYRINGOTOMY 45DEG STRL (BLADE) ×2 IMPLANT
CANISTER SUCT 1200ML W/VALVE (MISCELLANEOUS) ×2 IMPLANT
COTTONBALL LRG STERILE PKG (GAUZE/BANDAGES/DRESSINGS) ×2 IMPLANT
DROPPER MEDICINE STER 1.5ML LF (MISCELLANEOUS) IMPLANT
GLOVE SURG SS PI 7.0 STRL IVOR (GLOVE) ×1 IMPLANT
IV SET EXT 30 76VOL 4 MALE LL (IV SETS) ×2 IMPLANT
NS IRRIG 1000ML POUR BTL (IV SOLUTION) IMPLANT
SPONGE GAUZE 4X4 12PLY STER LF (GAUZE/BANDAGES/DRESSINGS) IMPLANT
TOWEL OR 17X24 6PK STRL BLUE (TOWEL DISPOSABLE) ×2 IMPLANT
TUBE CONNECTING 20X1/4 (TUBING) ×2 IMPLANT
TUBE EAR SHEEHY BUTTON 1.27 (OTOLOGIC RELATED) ×2 IMPLANT
TUBE EAR T MOD 1.32X4.8 BL (OTOLOGIC RELATED) ×2 IMPLANT

## 2015-03-02 NOTE — Op Note (Signed)
DATE OF PROCEDURE:  03/02/2015                              OPERATIVE REPORT  SURGEON:  Leta Baptist, MD  PREOPERATIVE DIAGNOSES: 1. Bilateral eustachian tube dysfunction. 2. Bilateral recurrent otitis media.  POSTOPERATIVE DIAGNOSES: 1. Bilateral eustachian tube dysfunction. 2. Bilateral recurrent otitis media.  PROCEDURE PERFORMED: 1) Bilateral myringotomy and tube placement.          ANESTHESIA:  General facemask anesthesia.  COMPLICATIONS:  None.  ESTIMATED BLOOD LOSS:  Minimal.  INDICATION FOR PROCEDURE:   Margaret Lopez is a 45 y.o. female with a history of recurrent sinus infections and eustachian tube dysfunction. Over the past few weeks, she has been experiencing increasing middle ear effusion and bilateral conductive hearing loss. The patient was treated with multiple courses of antibiotics, topical steroids, and Valsalva exercise. However, she continued to be symptomatic. On examination, the patient was noted to have middle ear effusion  bilaterally.  Based on the above findings, the decision was made for the patient to undergo the myringotomy and tube placement procedure. Likelihood of success in reducing symptoms was also discussed.  The risks, benefits, alternatives, and details of the procedure were discussed with the mother.  Questions were invited and answered.  Informed consent was obtained.  DESCRIPTION:  The patient was taken to the operating room and placed supine on the operating table.  General facemask anesthesia was administered by the anesthesiologist.  Under the operating microscope, the right ear canal was cleaned of all cerumen.  The tympanic membrane was noted to be intact but mildly retracted.  A standard myringotomy incision was made at the anterior-inferior quadrant on the tympanic membrane.  A copious amount of serous fluid was suctioned from behind the tympanic membrane. A T tube was placed, followed by antibiotic eardrops in the ear canal.  The same procedure was  repeated on the left side without exception. The care of the patient was turned over to the anesthesiologist.  The patient was awakened from anesthesia without difficulty.  The patient was transferred to the recovery room in good condition.  OPERATIVE FINDINGS:  A copious amount of serous effusion was noted bilaterally.  SPECIMEN:  None.  FOLLOWUP CARE:  The patient will be placed on Ciprodex eardrops 4 drops each ear b.i.d. for 5 days.  The patient will follow up in my office in approximately 4 weeks.  Micheline Markes WOOI 03/02/2015

## 2015-03-02 NOTE — Discharge Instructions (Addendum)
POSTOPERATIVE INSTRUCTIONS FOR PATIENTS HAVING MYRINGOTOMY AND TUBES  1. Please use the ear drops in each ear with a new tube for the next  3-4 days.  Use the drops as prescribed by your doctor, placing the drops into the outer opening of the ear canal with the head tilted to the opposite side. Place a clean piece of cotton into the ear after using drops. A small amount of blood tinged drainage is not uncommon for several days after the tubes are inserted. 2. Nausea and vomiting may be expected the first 6 hours after surgery. Offer liquids initially. If there is no nausea, small light meals are usually best tolerated the day of surgery. A normal diet may be resumed once nausea has passed. 3. The patient may experience mild ear discomfort the day of surgery, which is usually relieved by Tylenol. 4. A small amount of clear or blood-tinged drainage from the ears may occur a few days after surgery. If this should persists or become thick, green, yellow, or foul smelling, please contact our office at [phone]. 5. If you see clear, green, or yellow drainage from your childs ear during colds, clean the outer ear gently with a soft, damp washcloth. Begin the prescribed ear drops (4 drops, twice a day) for one week, as previously instructed.  The drainage should stop within 48 hours after starting the ear drops. If the drainage continues or becomes yellow or green, please call our office. If your child develops a fever greater than 102 F, or has and persistent bleeding from the ear(s), please call us. 6. Try to avoid getting water in the ears. Swimming is permitted as long as there is no deep diving or swimming under water deeper than 3 feet. If you think water has gotten into the ear(s), either bathing or swimming, place 4 drops of the prescribed ear drops into the ear in question. We do recommend drops after swimming in the ocean, rivers, or lakes. 7. It is important for you to return for your scheduled  appointment so that the status of the tubes can be determined.        Post Anesthesia Home Care Instructions  Activity: Get plenty of rest for the remainder of the day. A responsible adult should stay with you for 24 hours following the procedure.  For the next 24 hours, DO NOT: -Drive a car -Paediatric nurse -Drink alcoholic beverages -Take any medication unless instructed by your physician -Make any legal decisions or sign important papers.  Meals: Start with liquid foods such as gelatin or soup. Progress to regular foods as tolerated. Avoid greasy, spicy, heavy foods. If nausea and/or vomiting occur, drink only clear liquids until the nausea and/or vomiting subsides. Call your physician if vomiting continues.  Special Instructions/Symptoms: Your throat may feel dry or sore from the anesthesia or the breathing tube placed in your throat during surgery. If this causes discomfort, gargle with warm salt water. The discomfort should disappear within 24 hours.  If you had a scopolamine patch placed behind your ear for the management of post- operative nausea and/or vomiting:  1. The medication in the patch is effective for 72 hours, after which it should be removed.  Wrap patch in a tissue and discard in the trash. Wash hands thoroughly with soap and water. 2. You may remove the patch earlier than 72 hours if you experience unpleasant side effects which may include dry mouth, dizziness or visual disturbances. 3. Avoid touching the patch. Wash your  your hands with soap and water after contact with the patch.       

## 2015-03-02 NOTE — Anesthesia Preprocedure Evaluation (Signed)
Anesthesia Evaluation  Patient identified by MRN, date of birth, ID band Patient awake    Reviewed: Allergy & Precautions, NPO status , Patient's Chart, lab work & pertinent test results  Airway Mallampati: III  TM Distance: >3 FB Neck ROM: Full    Dental no notable dental hx. (+) Teeth Intact   Pulmonary sleep apnea and Continuous Positive Airway Pressure Ventilation , Current Smoker,  Chronic SOM   Pulmonary exam normal breath sounds clear to auscultation       Cardiovascular negative cardio ROS Normal cardiovascular exam Rhythm:Regular Rate:Normal     Neuro/Psych  Headaches, PSYCHIATRIC DISORDERS Anxiety Depression Bipolar Disorder    GI/Hepatic Neg liver ROS, GERD  Medicated and Controlled,  Endo/Other  negative endocrine ROS  Renal/GU negative Renal ROS  negative genitourinary   Musculoskeletal negative musculoskeletal ROS (+)   Abdominal (+) + obese,   Peds  Hematology negative hematology ROS (+)   Anesthesia Other Findings   Reproductive/Obstetrics negative OB ROS                             Anesthesia Physical Anesthesia Plan  ASA: II  Anesthesia Plan: General   Post-op Pain Management:    Induction: Intravenous  Airway Management Planned: Mask  Additional Equipment:   Intra-op Plan:   Post-operative Plan:   Informed Consent: I have reviewed the patients History and Physical, chart, labs and discussed the procedure including the risks, benefits and alternatives for the proposed anesthesia with the patient or authorized representative who has indicated his/her understanding and acceptance.   Dental advisory given  Plan Discussed with: CRNA, Anesthesiologist and Surgeon  Anesthesia Plan Comments:         Anesthesia Quick Evaluation

## 2015-03-02 NOTE — Transfer of Care (Signed)
Immediate Anesthesia Transfer of Care Note  Patient: Margaret Lopez  Procedure(s) Performed: Procedure(s): MYRINGOTOMY WITH T-TUBE PLACEMENT (Bilateral)  Patient Location: PACU  Anesthesia Type:General  Level of Consciousness: awake  Airway & Oxygen Therapy: Patient Spontanous Breathing and Patient connected to face mask oxygen  Post-op Assessment: Report given to RN and Post -op Vital signs reviewed and stable  Post vital signs: Reviewed and stable  Last Vitals:  Filed Vitals:   03/02/15 0759  BP: 134/66  Pulse: 73  Temp: 36.9 C  Resp: 18    Complications: No apparent anesthesia complications

## 2015-03-02 NOTE — H&P (Signed)
Cc: Sinus pressure, ear pain, hearing loss  HPI: The patient is a 45 y/o female who presents today complaining of recurrent sinusitis for many years. She was previously seen in 2012 and underwent septoplasty with bilateral inferior turbinate reduction. Sinus CT at that time showed no evidence of sinus disease. The patient presents today with complaints of recurrent sinus and ear infections. She states she has been treated 6 times for an infection since August. She has been on multiple antibiotics and steroids. The patient's most recent symptoms began last week with left ear pain. She was placed on prednisone followed by Levaquin. The patient was diagnosed with a sinus and left ear infection. The patient notes persistent sinus pressure and left ear pain. She also complains of left ear hearing loss and pressure. No otorrhea or dizziness is currently noted. The patient has been using Flonase for the past week. No recent CT scan has been obtained.   The patient's review of systems (constitutional, eyes, ENT, cardiovascular, respiratory, GI, musculoskeletal, skin, neurologic, psychiatric, endocrine, hematologic, allergic) is noted in the ROS questionnaire.  It is reviewed with the with the patient.   Allergies: Penicillin, Sulfa.   Family health history: Heart disease, migraines, sinus problems.   Major events: Partial hysterectomy, tumor removed from face, septoplasty, bilateral turbinate repair.   Ongoing medical problems: Chronic sinusitis, reflux, asthma, migraine, bipolar, allergies, hay fever, herpes.   Social history: The patient is single. She does smoke 1/4 to 1/2 pack of cigarettes per day. She does drink alcohol once per week. She denies the use of illegal drugs.  Exam General: Communicates without difficulty, well nourished, no acute distress. Head: Normocephalic, no evidence injury, no tenderness, facial buttresses intact without stepoff. Eyes: PERRL, EOMI. No scleral icterus, conjunctivae  clear. Neuro: CN II exam reveals vision grossly intact. No nystagmus at any point of gaze. Ears: Auricles well formed without lesions. Ear canals are intact without mass or lesion. No erythema or edema is appreciated. The TMs are intact with fluid noted on the left. Nose: External evaluation reveals normal support and skin without lesions. Dorsum is intact. Anterior rhinoscopy reveals bilateral nasal mucosal congestion with diffuse inflammation. No purulence noted. Oral:  Oral cavity and oropharynx are intact, symmetric, without erythema or edema. Mucosa is moist without lesions. Neck: Full range of motion without pain. There is no significant lymphadenopathy. No masses palpable. Thyroid bed within normal limits to palpation. Parotid glands and submandibular glands equal bilaterally without mass. Trachea is midline. Neuro:  CN 2-12 grossly intact. Gait normal.    Procedure: Flexible Nasal Endoscopy: Risks, benefits, and alternatives of flexible endoscopy were explained to the patient. Specific mention was made of the risk of throat numbness with difficulty swallowing, possible bleeding from the nose and mouth, and pain from the procedure. The patient gave oral consent to proceed. The nasal cavities were decongested and anesthetised with a combination of oxymetazoline and 4% lidocaine solution. The flexible scope was inserted into the right nasal cavity. Endoscopy of the inferior and middle meatus was performed. No polyp, mass, or lesion was appreciated. Olfactory cleft was clear. Nasopharynx was clear. Turbinates were well healed. The procedure was repeated on the contralateral side with similar findings. The patient tolerated the procedure well. Instructions were given to avoid eating or drinking for 2 hours.  AUDIOMETRIC TESTING:  Shows conductive hearing loss on the left. The speech reception threshold is 20dB AD and 55dB AS. The discrimination score is 96% AD and 84% AS. The tympanogram is flat  on the left  with negative pressure on the right.   Assessment 1. Left middle ear effusion is noted with associated conductive hearing loss.  2. Eustachian tube dysfunction.  3. Chronic rhinitis with diffusely inflamed sinus openings. No purulent drainage, polyps, or other suspicious mass or lesion is noted on today's nasal endoscopy. 4.  Turbinates and septum are well healed.   Plan 1.  The physical exam, nasal endoscopy, and hearing test findings are reviewed with the patient.  2.  Continue with daily Flonase and the valsalva exercise. Complete a full 14 day course of Levaquin. 3.  Sinus CT for further evaluation of her chronic sinus symptoms - CT shows left middle ear effusion. 4.  Plan myringotomy and tube placement in OR to treat the middle ear effusion  Update on day of surgery: Pt is noted to have bilateral middle ear effusion today. Plan bilateral procedure.

## 2015-03-02 NOTE — Anesthesia Postprocedure Evaluation (Signed)
Anesthesia Post Note  Patient: Margaret Lopez  Procedure(s) Performed: Procedure(s) (LRB): MYRINGOTOMY WITH T-TUBE PLACEMENT (Bilateral)  Patient location during evaluation: PACU Anesthesia Type: General Level of consciousness: awake and alert and oriented Pain management: pain level controlled Vital Signs Assessment: post-procedure vital signs reviewed and stable Respiratory status: spontaneous breathing, nonlabored ventilation and respiratory function stable Cardiovascular status: blood pressure returned to baseline and stable Postop Assessment: no signs of nausea or vomiting Anesthetic complications: no    Last Vitals:  Filed Vitals:   03/02/15 0759 03/02/15 0849  BP: 134/66 149/88  Pulse: 73 94  Temp: 36.9 C 36.8 C  Resp: 18 20    Last Pain:  Filed Vitals:   03/02/15 0858  PainSc: 0-No pain                 Nillie Bartolotta A.

## 2015-03-03 ENCOUNTER — Encounter (HOSPITAL_BASED_OUTPATIENT_CLINIC_OR_DEPARTMENT_OTHER): Payer: Self-pay | Admitting: Otolaryngology

## 2015-03-18 ENCOUNTER — Other Ambulatory Visit: Payer: Self-pay | Admitting: Obstetrics and Gynecology

## 2015-03-19 LAB — CYTOLOGY - PAP

## 2015-03-30 ENCOUNTER — Other Ambulatory Visit (HOSPITAL_COMMUNITY): Payer: Self-pay | Admitting: Psychiatry

## 2015-04-01 ENCOUNTER — Other Ambulatory Visit (HOSPITAL_COMMUNITY): Payer: Self-pay | Admitting: Psychiatry

## 2015-04-09 ENCOUNTER — Encounter (HOSPITAL_COMMUNITY): Payer: Self-pay | Admitting: Psychiatry

## 2015-04-09 ENCOUNTER — Ambulatory Visit (INDEPENDENT_AMBULATORY_CARE_PROVIDER_SITE_OTHER): Payer: BLUE CROSS/BLUE SHIELD | Admitting: Psychiatry

## 2015-04-09 VITALS — BP 134/84 | HR 69 | Ht 64.0 in | Wt 224.4 lb

## 2015-04-09 DIAGNOSIS — F319 Bipolar disorder, unspecified: Secondary | ICD-10-CM

## 2015-04-09 DIAGNOSIS — F17200 Nicotine dependence, unspecified, uncomplicated: Secondary | ICD-10-CM

## 2015-04-09 DIAGNOSIS — F411 Generalized anxiety disorder: Secondary | ICD-10-CM | POA: Diagnosis not present

## 2015-04-09 MED ORDER — DIVALPROEX SODIUM 500 MG PO DR TAB: 2000.0000 mg | DELAYED_RELEASE_TABLET | Freq: Every day | ORAL | Status: DC

## 2015-04-09 MED ORDER — ALPRAZOLAM 0.5 MG PO TABS: 0.5000 mg | ORAL_TABLET | Freq: Every evening | ORAL | Status: DC | PRN

## 2015-04-09 MED ORDER — LITHIUM CARBONATE ER 450 MG PO TBCR: EXTENDED_RELEASE_TABLET | ORAL | Status: DC

## 2015-04-09 NOTE — Progress Notes (Signed)
Patient ID: Margaret Lopez, female   DOB: 1970-09-18, 45 y.o.   MRN: [id number]  Margaret [id number] Progress Note  DESHONDRA ANSELL [id number] 44 y.o.  04/09/2015 8:48 AM  Chief Complaint: pretty good  History of Present Illness: Reports her stress is very high especially with her son's school work.  She is having stress induced panic attacks like symptoms but not full blown. She is taking Xanax 0.125-0.5mg  nightly to help her sleep or during the day if anxiety is really bad. Pt is having daily racing thoughts, nervousness, SOB and palpitations. Pt has hives on her stomach that come and go due to anxiety.  States it has to do with her ex and fighting for custody for her kids.   Depression seems to be getting better. She is no longer tearful.  She is working on Isolation, worthlessness, hopelessness and anhedonia.  She is trying hard not to isolate herself.  It is struggle.   Sleep is good with Bipap and she is getting about 7 hrs a night. Energy remains low. Appetite is fair. Concentration is ok.   Denies manic and hypomanic symptoms including periods of decreased need for sleep, increased energy, mood lability, impulsivity, FOI, and excessive spending. Denies irritability. No longer engaging in sexual promiscuity.   Pt is taking Depakote, Lithium and Xanax as prescribed and denies SE.    Pt is going to weekly family therapy.   Suicidal Ideation: No Plan Formed: No Patient has means to carry out plan: No  Homicidal Ideation: No Plan Formed: No Patient has means to carry out plan: No  Review of Systems: Psychiatric: Agitation: No Hallucination: No Depressed Mood: Yes Insomnia: No Hypersomnia: No Altered Concentration: No Feels Worthless: Yes Grandiose Ideas: No Belief In Special Powers: No New/Increased Substance Abuse: No Compulsions: No  Neurologic: Headache: No  Seizure: No Paresthesias: No  Review of Systems  Constitutional: Negative for fever, chills and  weight loss.  HENT: Positive for congestion and ear pain. Negative for sore throat.   Eyes: Negative for blurred vision, double vision and redness.  Respiratory: Negative for cough, shortness of breath and wheezing.   Cardiovascular: Negative for chest pain, palpitations and leg swelling.  Gastrointestinal: Negative for heartburn, nausea, vomiting and abdominal pain.  Musculoskeletal: Negative for back pain, joint pain and neck pain.  Skin: Positive for rash. Negative for itching.  Neurological: Negative for dizziness, tremors, focal weakness, seizures, loss of consciousness, weakness and headaches.  Psychiatric/Behavioral: Positive for depression. Negative for suicidal ideas, hallucinations and substance abuse. The patient is nervous/anxious. The patient does not have insomnia.       Past Medical Family, Social History: Starling Lopez in own house with her 2 sons. Seperated from husband. Smokes 1/2-1 ppd, denies regular alcohol use. Denies illicit substance abuse.  Family History  Problem Relation Age of Onset  . ADD / ADHD Sister   . Alcohol abuse Maternal Grandmother   . Bipolar disorder Maternal Grandmother   . Suicidality Neg Hx     Past Medical History  Diagnosis Date  . Deviated nasal septum 02/2011  . Nasal turbinate hypertrophy 02/2011    bilat.  [name] Headache(784.0)     migraines  . GERD (gastroesophageal reflux disease)   . Bipolar disorder ([place])   . Seasonal allergies   . Anxiety   . Depression   . Sleep apnea     uses BIPAP nightly    Outpatient Encounter Prescriptions as of 04/09/2015  Medication Sig  . ALPRAZolam Duanne Moron)  0.5 MG tablet Take 1 tablet (0.5 mg total) by mouth at bedtime as needed for anxiety.  . divalproex (DEPAKOTE) 500 MG DR tablet Take 4 tablets (2,000 mg total) by mouth at bedtime.  [name] esomeprazole (NEXIUM) 20 MG capsule Take 40 mg by mouth 2 (two) times daily.   [name] levocetirizine (XYZAL) 5 MG tablet Take 5 mg by mouth every evening.  . lithium  carbonate (ESKALITH) 450 MG CR tablet TAKE 1 TABLET(450 MG) BY MOUTH AT BEDTIME  . ondansetron (ZOFRAN) 4 MG tablet Take 4 mg by mouth every 8 (eight) hours as needed for nausea or vomiting.  . terbinafine (LAMISIL) 250 MG tablet Take one tablet once daily x 7 days, then repeat every month x 4 months  . cetirizine (ZYRTEC) 10 MG tablet Take 10 mg by mouth daily. Reported on 04/09/2015   No facility-administered encounter medications on file as of 04/09/2015.    Past Psychiatric History/Hospitalization(s): Anxiety: Yes Bipolar Disorder: Yes Depression: Yes Mania: Yes Psychosis: No Schizophrenia: No Personality Disorder: No Hospitalization for psychiatric illness: No History of Electroconvulsive Shock Therapy: No Prior Suicide Attempts: No  Physical Exam: Constitutional:  BP 134/84 mmHg  Pulse 69  Ht 5\' 4"  (1.626 m)  Wt 224 lb 6.4 oz (101.787 kg)  BMI 38.50 kg/m2  General Appearance: alert, oriented, no acute distress  Musculoskeletal: Strength & Muscle Tone: within normal limits Gait & Station: normal Patient leans: straight  Mental Status Examination/Evaluation: Objective: Attitude: Calm and cooperative  Appearance: Casual, appears to be stated age  Eye Contact::  Fair  Speech:  Clear and Coherent and Normal Rate  Volume:  Normal  Mood:  Anxious and depressed  Affect:  Constricted  Thought Process:  Linear and Logical  Orientation:  Full (Time, Place, and Person)  Thought Content:  Negative  Suicidal Thoughts:  No  Homicidal Thoughts:  No  Judgement:  Fair  Insight:  Fair  Concentration: good  Memory: Immediate-fair Recent-fair Remote-fair  Recall: fair  Language: fair  Gait and Station: normal  [employer] of Knowledge: average  Psychomotor Activity:  Normal  Akathisia:  No  Handed:  Right  AIMS (if indicated):  Facial and Oral Movements  Muscles of Facial Expression: None, normal  Lips and Perioral Area: None, normal  Jaw: None, normal  Tongue: None,  normal Extremity Movements: Upper (arms, wrists, hands, fingers): None, normal  Lower (legs, knees, ankles, toes): None, normal,  Trunk Movements:  Neck, shoulders, hips: None, normal,  Overall Severity : Severity of abnormal movements (highest score from questions above): None, normal  Incapacitation due to abnormal movements: None, normal  Patient's awareness of abnormal movements (rate only patient's report): No Awareness, Dental Status  Current problems with teeth and/or dentures?: No  Does patient usually wear dentures?: No    Assets: Publishing copy Desire for improvement      Medical Decision Making (Choose Three): Established Problem, Stable/Improving (1), Review of Psycho-Social Stressors (1), Established Problem, Worsening (2), Review of Medication Regimen & Side Effects (2) and Review of New Medication or Change in Dosage (2)  Assessment: Axis I: Bipolar- depressed, recurrent episode and mild; GAD, Nicotine Dependence  Axis II: deferred     Plan: Continue Depakote 2000mg  po qD for mood stabalization.  Continue Xanax 0.5mg  po qD prn anxiety  Lithium CR 450mg  po qD for mood -Pt requires multiple mood stabalizers to keep her mood level. She has shown significantly disruptive symptoms when treated with only one mood stabalizer. Pt is tolerating the  regime without SE.    Medication management with supportive therapy. Risks/benefits and SE of the medication discussed. Pt verbalized understanding and verbal consent obtained for treatment.  Affirm with the patient that the medications are taken as ordered. Patient expressed understanding of how their medications were to be used.  -improvement of depression symptoms and worsening of anxiety. Pt doesn't want meds changed today.   Labs: 08/13/2014 Valproic acid 88, lithium 0.4, TSH 2.51, CMP WNL, CBC WNL  Therapy: brief supportive therapy provided. Discussed psychosocial stressors in detail.    Pt denies SI and is  at an acute low risk for suicide.Patient told to call clinic if any problems occur. Patient advised to go to ER  if she should develop SI/HI, side effects, or if symptoms worsen. Has crisis numbers to call if needed. Pt verbalized understanding.  F/up in 3 months or sooner if needed  Charlcie Cradle, MD 04/09/2015

## 2015-04-13 DIAGNOSIS — G518 Other disorders of facial nerve: Secondary | ICD-10-CM | POA: Diagnosis not present

## 2015-04-13 DIAGNOSIS — M791 Myalgia: Secondary | ICD-10-CM | POA: Diagnosis not present

## 2015-04-13 DIAGNOSIS — G43019 Migraine without aura, intractable, without status migrainosus: Secondary | ICD-10-CM | POA: Diagnosis not present

## 2015-04-13 DIAGNOSIS — M542 Cervicalgia: Secondary | ICD-10-CM | POA: Diagnosis not present

## 2015-04-13 DIAGNOSIS — G4482 Headache associated with sexual activity: Secondary | ICD-10-CM | POA: Diagnosis not present

## 2015-04-30 DIAGNOSIS — G4482 Headache associated with sexual activity: Secondary | ICD-10-CM | POA: Diagnosis not present

## 2015-04-30 DIAGNOSIS — G518 Other disorders of facial nerve: Secondary | ICD-10-CM | POA: Diagnosis not present

## 2015-04-30 DIAGNOSIS — M791 Myalgia: Secondary | ICD-10-CM | POA: Diagnosis not present

## 2015-04-30 DIAGNOSIS — G43019 Migraine without aura, intractable, without status migrainosus: Secondary | ICD-10-CM | POA: Diagnosis not present

## 2015-04-30 DIAGNOSIS — M542 Cervicalgia: Secondary | ICD-10-CM | POA: Diagnosis not present

## 2015-05-14 DIAGNOSIS — M542 Cervicalgia: Secondary | ICD-10-CM | POA: Diagnosis not present

## 2015-05-14 DIAGNOSIS — G4482 Headache associated with sexual activity: Secondary | ICD-10-CM | POA: Diagnosis not present

## 2015-05-14 DIAGNOSIS — M791 Myalgia: Secondary | ICD-10-CM | POA: Diagnosis not present

## 2015-05-14 DIAGNOSIS — G518 Other disorders of facial nerve: Secondary | ICD-10-CM | POA: Diagnosis not present

## 2015-05-14 DIAGNOSIS — G43019 Migraine without aura, intractable, without status migrainosus: Secondary | ICD-10-CM | POA: Diagnosis not present

## 2015-05-27 DIAGNOSIS — M791 Myalgia: Secondary | ICD-10-CM | POA: Diagnosis not present

## 2015-05-27 DIAGNOSIS — M542 Cervicalgia: Secondary | ICD-10-CM | POA: Diagnosis not present

## 2015-05-27 DIAGNOSIS — G43019 Migraine without aura, intractable, without status migrainosus: Secondary | ICD-10-CM | POA: Diagnosis not present

## 2015-05-27 DIAGNOSIS — G4482 Headache associated with sexual activity: Secondary | ICD-10-CM | POA: Diagnosis not present

## 2015-05-27 DIAGNOSIS — G518 Other disorders of facial nerve: Secondary | ICD-10-CM | POA: Diagnosis not present

## 2015-06-10 DIAGNOSIS — M542 Cervicalgia: Secondary | ICD-10-CM | POA: Diagnosis not present

## 2015-06-10 DIAGNOSIS — G43019 Migraine without aura, intractable, without status migrainosus: Secondary | ICD-10-CM | POA: Diagnosis not present

## 2015-06-10 DIAGNOSIS — G518 Other disorders of facial nerve: Secondary | ICD-10-CM | POA: Diagnosis not present

## 2015-06-10 DIAGNOSIS — G4482 Headache associated with sexual activity: Secondary | ICD-10-CM | POA: Diagnosis not present

## 2015-06-10 DIAGNOSIS — M791 Myalgia: Secondary | ICD-10-CM | POA: Diagnosis not present

## 2015-06-24 DIAGNOSIS — M542 Cervicalgia: Secondary | ICD-10-CM | POA: Diagnosis not present

## 2015-06-24 DIAGNOSIS — G43019 Migraine without aura, intractable, without status migrainosus: Secondary | ICD-10-CM | POA: Diagnosis not present

## 2015-06-24 DIAGNOSIS — G518 Other disorders of facial nerve: Secondary | ICD-10-CM | POA: Diagnosis not present

## 2015-06-24 DIAGNOSIS — M791 Myalgia: Secondary | ICD-10-CM | POA: Diagnosis not present

## 2015-06-24 DIAGNOSIS — G4482 Headache associated with sexual activity: Secondary | ICD-10-CM | POA: Diagnosis not present

## 2015-07-09 DIAGNOSIS — G4733 Obstructive sleep apnea (adult) (pediatric): Secondary | ICD-10-CM | POA: Diagnosis not present

## 2015-07-09 DIAGNOSIS — G4700 Insomnia, unspecified: Secondary | ICD-10-CM | POA: Diagnosis not present

## 2015-07-10 ENCOUNTER — Ambulatory Visit (INDEPENDENT_AMBULATORY_CARE_PROVIDER_SITE_OTHER): Payer: BLUE CROSS/BLUE SHIELD | Admitting: Psychology

## 2015-07-10 DIAGNOSIS — F3011 Manic episode without psychotic symptoms, mild: Secondary | ICD-10-CM | POA: Diagnosis not present

## 2015-07-16 ENCOUNTER — Encounter (HOSPITAL_COMMUNITY): Payer: Self-pay | Admitting: Psychiatry

## 2015-07-16 ENCOUNTER — Ambulatory Visit (INDEPENDENT_AMBULATORY_CARE_PROVIDER_SITE_OTHER): Payer: BLUE CROSS/BLUE SHIELD | Admitting: Psychiatry

## 2015-07-16 VITALS — BP 122/80 | HR 75 | Ht 64.0 in | Wt 221.6 lb

## 2015-07-16 DIAGNOSIS — F411 Generalized anxiety disorder: Secondary | ICD-10-CM

## 2015-07-16 DIAGNOSIS — Z79899 Other long term (current) drug therapy: Secondary | ICD-10-CM | POA: Diagnosis not present

## 2015-07-16 DIAGNOSIS — F319 Bipolar disorder, unspecified: Secondary | ICD-10-CM

## 2015-07-16 MED ORDER — ALPRAZOLAM 0.5 MG PO TABS: 0.5000 mg | ORAL_TABLET | Freq: Every evening | ORAL | Status: DC | PRN

## 2015-07-16 MED ORDER — DIVALPROEX SODIUM 500 MG PO DR TAB: 2000.0000 mg | DELAYED_RELEASE_TABLET | Freq: Every day | ORAL | Status: DC

## 2015-07-16 MED ORDER — LITHIUM CARBONATE ER 450 MG PO TBCR
EXTENDED_RELEASE_TABLET | ORAL | Status: DC
Start: 2015-07-16 — End: 2015-10-29

## 2015-07-16 NOTE — Progress Notes (Signed)
Patient ID: Margaret Lopez, female   DOB: 01/20/1970, 45 y.o.   MRN: [id number] Patient ID: Margaret Lopez, female   DOB: April 02, 1970, 45 y.o.   MRN: [id number]  Margaret Lopez Progress Note  Margaret Lopez [id number] 45 y.o.  07/16/2015 9:13 AM  Chief Complaint: pretty good  History of Present Illness: Pt has restarted therapy and it is going well.   Reports her stress is very high regarding finances, boyfriend, sons and her boyfriend's son..  She had 2 stress induced panic attacks and other times panic attack like symptoms but not full blown. She is taking Xanax 0.125-0.5mg  nightly to help her sleep or during the day if anxiety is really bad. Some days she takes Xanax during the day. Pt is having daily racing thoughts, nervousness, SOB and palpitations. Pt has hives on her stomach that come and go due to anxiety.   Depression seems to be getting better. She is no longer tearful.  She is working on Isolation, worthlessness, hopelessness and anhedonia.  She is trying hard not to isolate herself.  It is struggle. She is overwhelmed more than anxious.   Sleep is good with Bipap and she is getting about 7 hrs a night. Energy remains low. Appetite is fair. Concentration is ok.   Denies manic and hypomanic symptoms including periods of decreased need for sleep, increased energy, mood lability, impulsivity, FOI, and excessive spending. Denies irritability. No longer engaging in sexual promiscuity.   Pt is taking Depakote, Lithium and Xanax as prescribed and denies SE.     Suicidal Ideation: No Plan Formed: No Patient has means to carry out plan: No  Homicidal Ideation: No Plan Formed: No Patient has means to carry out plan: No  Review of Systems: Psychiatric: Agitation: No Hallucination: No Depressed Mood: Yes Insomnia: No Hypersomnia: No Altered Concentration: No Feels Worthless: Yes Grandiose Ideas: No Belief In Special Powers: No New/Increased Substance Abuse:  No Compulsions: No  Neurologic: Headache: No  Seizure: No Paresthesias: No  Review of Systems  Constitutional: Negative for fever, chills and weight loss.  HENT: Negative for congestion, ear pain and sore throat.   Eyes: Negative for blurred vision, double vision and redness.  Respiratory: Negative for cough, shortness of breath and wheezing.   Cardiovascular: Negative for chest pain, palpitations and leg swelling.  Gastrointestinal: Negative for heartburn, nausea, vomiting and abdominal pain.  Musculoskeletal: Negative for back pain, joint pain and neck pain.  Skin: Negative for itching and rash.  Neurological: Negative for dizziness, tremors, focal weakness, seizures, loss of consciousness, weakness and headaches.  Endo/Heme/Allergies: Positive for environmental allergies.  Psychiatric/Behavioral: Positive for depression. Negative for suicidal ideas, hallucinations and substance abuse. The patient is nervous/anxious. The patient does not have insomnia.       Past Medical Family, Social History: Margaret Lopez in own house with her 2 sons. Seperated from husband. Smokes 1/2-1 ppd, denies regular alcohol use. Denies illicit substance abuse.  Family History  Problem Relation Age of Onset  . ADD / ADHD Sister   . Alcohol abuse Maternal Grandmother   . Bipolar disorder Maternal Grandmother   . Suicidality Neg Hx     Past Medical History  Diagnosis Date  . Deviated nasal septum 02/2011  . Nasal turbinate hypertrophy 02/2011    bilat.  [name] Headache(784.0)     migraines  . GERD (gastroesophageal reflux disease)   . Bipolar disorder ([place])   . Seasonal allergies   . Anxiety   . Depression   .  Sleep apnea     uses BIPAP nightly    Outpatient Encounter Prescriptions as of 07/16/2015  Medication Sig  . ALPRAZolam (XANAX) 0.5 MG tablet Take 1 tablet (0.5 mg total) by mouth at bedtime as needed for anxiety.  . cetirizine (ZYRTEC) 10 MG tablet Take 10 mg by mouth daily. Reported on  04/09/2015  . divalproex (DEPAKOTE) 500 MG DR tablet Take 4 tablets (2,000 mg total) by mouth at bedtime.  [name] esomeprazole (NEXIUM) 20 MG capsule Take 40 mg by mouth 2 (two) times daily.   [name] levocetirizine (XYZAL) 5 MG tablet Take 5 mg by mouth every evening.  . lithium carbonate (ESKALITH) 450 MG CR tablet TAKE 1 TABLET(450 MG) BY MOUTH AT BEDTIME  . ondansetron (ZOFRAN) 4 MG tablet Take 4 mg by mouth every 8 (eight) hours as needed for nausea or vomiting.  . terbinafine (LAMISIL) 250 MG tablet Take one tablet once daily x 7 days, then repeat every month x 4 months   No facility-administered encounter medications on file as of 07/16/2015.    Past Psychiatric History/Hospitalization(s): Anxiety: Yes Bipolar Disorder: Yes Depression: Yes Mania: Yes Psychosis: No Schizophrenia: No Personality Disorder: No Hospitalization for psychiatric illness: No History of Electroconvulsive Shock Therapy: No Prior Suicide Attempts: No  Physical Exam: Constitutional:  BP 122/80 mmHg  Pulse 75  Ht 5\' 4"  (1.626 m)  Wt 221 lb 9.6 oz (100.517 kg)  BMI 38.02 kg/m2  General Appearance: alert, oriented, no acute distress  Musculoskeletal: Strength & Muscle Tone: within normal limits Gait & Station: normal Patient leans: straight  Mental Status Examination/Evaluation: Objective: Attitude: Calm and cooperative  Appearance: Casual, appears to be stated age  Eye Contact::  Fair  Speech:  Clear and Coherent and Normal Rate  Volume:  Normal  Mood:  Anxious and depressed  Affect:  Constricted  Thought Process:  Linear and Logical  Orientation:  Full (Time, Place, and Person)  Thought Content:  Negative  Suicidal Thoughts:  No  Homicidal Thoughts:  No  Judgement:  Fair  Insight:  Fair  Concentration: good  Memory: Immediate-fair Recent-fair Remote-fair  Recall: fair  Language: fair  Gait and Station: normal  [employer] of Knowledge: average  Psychomotor Activity:  Normal  Akathisia:  No   Handed:  Right  AIMS (if indicated):  Facial and Oral Movements  Muscles of Facial Expression: None, normal  Lips and Perioral Area: None, normal  Jaw: None, normal  Tongue: None, normal Extremity Movements: Upper (arms, wrists, hands, fingers): None, normal  Lower (legs, knees, ankles, toes): None, normal,  Trunk Movements:  Neck, shoulders, hips: None, normal,  Overall Severity : Severity of abnormal movements (highest score from questions above): None, normal  Incapacitation due to abnormal movements: None, normal  Patient's awareness of abnormal movements (rate only patient's report): No Awareness, Dental Status  Current problems with teeth and/or dentures?: No  Does patient usually wear dentures?: No    Assets: Publishing copy Desire for improvement      Medical Decision Making (Choose Three): Established Problem, Stable/Improving (1), Review of Psycho-Social Stressors (1), Established Problem, Worsening (2), Review of Medication Regimen & Side Effects (2) and Review of New Medication or Change in Dosage (2)  Assessment: Axis I: Bipolar- depressed, recurrent episode and mild; GAD, Nicotine Dependence  Axis II: deferred     Plan: Continue Depakote 2000mg  po qD for mood stabalization.  Continue Xanax 0.5mg  po qD prn anxiety  Lithium CR 450mg  po qD for mood -Pt requires  multiple mood stabalizers to keep her mood level. She has shown significantly disruptive symptoms when treated with only one mood stabalizer. Pt is tolerating the regime without SE.    Medication management with supportive therapy. Risks/benefits and SE of the medication discussed. Pt verbalized understanding and verbal consent obtained for treatment.  Affirm with the patient that the medications are taken as ordered. Patient expressed understanding of how their medications were to be used.  -improvement of depression symptoms and worsening of anxiety. Pt doesn't want meds changed today.    Labs: 08/13/2014 Valproic acid 88, lithium 0.4, TSH 2.51, CMP WNL, CBC WNL  Depakote level, CBC (platelets), Lithium level, CMP with Bun/Creatinine, TSH  Therapy: brief supportive therapy provided. Discussed psychosocial stressors in detail.    Pt denies SI and is at an acute low risk for suicide.Patient told to call clinic if any problems occur. Patient advised to go to ER  if she should develop SI/HI, side effects, or if symptoms worsen. Has crisis numbers to call if needed. Pt verbalized understanding.  F/up in 3 months or sooner if needed  Margaret Cradle, MD 07/16/2015

## 2015-07-27 ENCOUNTER — Other Ambulatory Visit (HOSPITAL_COMMUNITY): Payer: Self-pay | Admitting: Psychiatry

## 2015-07-31 ENCOUNTER — Ambulatory Visit (INDEPENDENT_AMBULATORY_CARE_PROVIDER_SITE_OTHER): Payer: BLUE CROSS/BLUE SHIELD | Admitting: Psychology

## 2015-07-31 DIAGNOSIS — F3011 Manic episode without psychotic symptoms, mild: Secondary | ICD-10-CM

## 2015-08-10 DIAGNOSIS — J018 Other acute sinusitis: Secondary | ICD-10-CM | POA: Diagnosis not present

## 2015-08-25 DIAGNOSIS — G43019 Migraine without aura, intractable, without status migrainosus: Secondary | ICD-10-CM | POA: Diagnosis not present

## 2015-08-25 DIAGNOSIS — G4482 Headache associated with sexual activity: Secondary | ICD-10-CM | POA: Diagnosis not present

## 2015-08-28 ENCOUNTER — Ambulatory Visit (INDEPENDENT_AMBULATORY_CARE_PROVIDER_SITE_OTHER): Payer: BLUE CROSS/BLUE SHIELD | Admitting: Psychology

## 2015-08-28 DIAGNOSIS — F3011 Manic episode without psychotic symptoms, mild: Secondary | ICD-10-CM

## 2015-09-01 DIAGNOSIS — J343 Hypertrophy of nasal turbinates: Secondary | ICD-10-CM | POA: Diagnosis not present

## 2015-09-01 DIAGNOSIS — J310 Chronic rhinitis: Secondary | ICD-10-CM | POA: Diagnosis not present

## 2015-09-01 DIAGNOSIS — H6983 Other specified disorders of Eustachian tube, bilateral: Secondary | ICD-10-CM | POA: Diagnosis not present

## 2015-09-01 DIAGNOSIS — H7203 Central perforation of tympanic membrane, bilateral: Secondary | ICD-10-CM | POA: Diagnosis not present

## 2015-09-02 DIAGNOSIS — J301 Allergic rhinitis due to pollen: Secondary | ICD-10-CM | POA: Diagnosis not present

## 2015-09-02 DIAGNOSIS — J3081 Allergic rhinitis due to animal (cat) (dog) hair and dander: Secondary | ICD-10-CM | POA: Diagnosis not present

## 2015-09-02 DIAGNOSIS — R05 Cough: Secondary | ICD-10-CM | POA: Diagnosis not present

## 2015-09-02 DIAGNOSIS — J3089 Other allergic rhinitis: Secondary | ICD-10-CM | POA: Diagnosis not present

## 2015-09-07 DIAGNOSIS — J301 Allergic rhinitis due to pollen: Secondary | ICD-10-CM | POA: Diagnosis not present

## 2015-09-07 DIAGNOSIS — J3081 Allergic rhinitis due to animal (cat) (dog) hair and dander: Secondary | ICD-10-CM | POA: Diagnosis not present

## 2015-09-07 DIAGNOSIS — J3089 Other allergic rhinitis: Secondary | ICD-10-CM | POA: Diagnosis not present

## 2015-09-08 DIAGNOSIS — J3081 Allergic rhinitis due to animal (cat) (dog) hair and dander: Secondary | ICD-10-CM | POA: Diagnosis not present

## 2015-09-08 DIAGNOSIS — J301 Allergic rhinitis due to pollen: Secondary | ICD-10-CM | POA: Diagnosis not present

## 2015-09-08 DIAGNOSIS — J3089 Other allergic rhinitis: Secondary | ICD-10-CM | POA: Diagnosis not present

## 2015-09-25 ENCOUNTER — Ambulatory Visit: Payer: BLUE CROSS/BLUE SHIELD | Admitting: Psychology

## 2015-09-26 ENCOUNTER — Encounter (HOSPITAL_COMMUNITY): Payer: Self-pay

## 2015-09-26 ENCOUNTER — Emergency Department (HOSPITAL_COMMUNITY)
Admission: EM | Admit: 2015-09-26 | Discharge: 2015-09-26 | Disposition: A | Discharge status: Home / Self Care | Payer: BLUE CROSS/BLUE SHIELD | Attending: Emergency Medicine | Admitting: Emergency Medicine

## 2015-09-26 DIAGNOSIS — G43009 Migraine without aura, not intractable, without status migrainosus: Secondary | ICD-10-CM | POA: Insufficient documentation

## 2015-09-26 DIAGNOSIS — F17210 Nicotine dependence, cigarettes, uncomplicated: Secondary | ICD-10-CM | POA: Insufficient documentation

## 2015-09-26 DIAGNOSIS — G43909 Migraine, unspecified, not intractable, without status migrainosus: Secondary | ICD-10-CM | POA: Diagnosis not present

## 2015-09-26 MED ORDER — METOCLOPRAMIDE HCL 5 MG/ML IJ SOLN
10.0000 mg | Freq: Once | INTRAMUSCULAR | Status: AC
  Administered 2015-09-26: 10 mg via INTRAVENOUS
  Filled 2015-09-26: qty 2

## 2015-09-26 MED ORDER — DIPHENHYDRAMINE HCL 50 MG/ML IJ SOLN
25.0000 mg | Freq: Once | INTRAMUSCULAR | Status: AC
  Administered 2015-09-26: 25 mg via INTRAVENOUS
  Filled 2015-09-26: qty 1

## 2015-09-26 MED ORDER — KETOROLAC TROMETHAMINE 30 MG/ML IJ SOLN
30.0000 mg | Freq: Once | INTRAMUSCULAR | Status: AC
  Administered 2015-09-26: 30 mg via INTRAVENOUS
  Filled 2015-09-26: qty 1

## 2015-09-26 MED ORDER — SODIUM CHLORIDE 0.9 % IV BOLUS (SEPSIS)
1000.0000 mL | Freq: Once | INTRAVENOUS | Status: AC
  Administered 2015-09-26: 1000 mL via INTRAVENOUS

## 2015-09-26 NOTE — ED Notes (Signed)
THE PT REPORTS THAT HER PAIN IS BETTER THAN IT WAS BUT STILL  HURTING

## 2015-09-26 NOTE — ED Notes (Signed)
Called pt for recheck vitals no answer x1.

## 2015-09-26 NOTE — ED Provider Notes (Signed)
[place] DEPT Provider Note   CSN: [id number] Arrival date & time: 09/26/15  1330     History   Chief Complaint Chief Complaint  Patient presents with  . Migraine    HPI Margaret Lopez is a 45 y.o. female.  Migraine headache since Monday originating in her right occipital area and radiating to the right temporal parietal area. Patient has regular migraine headaches and sees a neurologist for this. She is on preventive medication (thorazine) on a regular basis. No neurological deficits or stiff neck or fever. Severity is moderate. She has tried Tylenol and Zofran today with minimal success.      Past Medical History:  Diagnosis Date  . Anxiety   . Bipolar disorder ([place])   . Depression   . Deviated nasal septum 02/2011  . GERD (gastroesophageal reflux disease)   . Headache(784.0)    migraines  . Nasal turbinate hypertrophy 02/2011   bilat.  . Seasonal allergies   . Sleep apnea    uses BIPAP nightly    Patient Active Problem List   Diagnosis Date Noted  . Bipolar 1 disorder, depressed ([place]) 04/16/2013  . Generalized anxiety disorder 04/16/2013    Past Surgical History:  Procedure Laterality Date  . ABDOMINAL HYSTERECTOMY    . CARPAL TUNNEL RELEASE Right   . FOOT SURGERY    . LAPAROSCOPIC VAGINAL HYSTERECTOMY  03/14/2007  . MYRINGOTOMY WITH TUBE PLACEMENT Bilateral 03/02/2015   Procedure: MYRINGOTOMY WITH T-TUBE PLACEMENT;  Surgeon: Leta Baptist, MD;  Location: [facility] [facility];  Service: ENT;  Laterality: Bilateral;  . NASAL SEPTOPLASTY W/ TURBINOPLASTY  02/22/2011   Procedure: NASAL SEPTOPLASTY WITH TURBINATE REDUCTION;  Surgeon: Ascencion Dike, MD;  Location: [facility];  Service: ENT;  Laterality: Bilateral;  . TUBAL LIGATION  12/16/2004    OB History    No data available       Home Medications    Prior to Admission medications   Medication Sig Start Date End Date Taking? Authorizing Provider  acetaminophen (TYLENOL) 500 MG tablet Take  1,000 mg by mouth daily as needed for headache.   Yes Historical Provider, MD  ALPRAZolam Duanne Moron) 0.5 MG tablet Take 1 tablet (0.5 mg total) by mouth at bedtime as needed for anxiety. Patient taking differently: Take 0.125 mg by mouth at bedtime as needed for anxiety.  07/16/15  Yes Charlcie Cradle, MD  azelastine (OPTIVAR) 0.05 % ophthalmic solution Place 1 drop into both eyes as needed (for itchy eyes).  09/02/15  Yes Historical Provider, MD  chlorproMAZINE (THORAZINE) 25 MG tablet Take 25-50 mg by mouth as needed (for headache).   Yes Historical Provider, MD  divalproex (DEPAKOTE) 500 MG DR tablet Take 4 tablets (2,000 mg total) by mouth at bedtime. 07/16/15  Yes Charlcie Cradle, MD  EPINEPHrine 0.3 mg/0.3 mL IJ SOAJ injection Inject 0.3 mg into the muscle once.  09/02/15  Yes Historical Provider, MD  esomeprazole (NEXIUM) 40 MG capsule Take 40 mg by mouth 2 (two) times daily. 08/10/15  Yes Historical Provider, MD  hydrOXYzine (ATARAX/VISTARIL) 25 MG tablet Take 75 mg by mouth every 8 (eight) hours as needed for itching.  09/02/15  Yes Historical Provider, MD  levocetirizine (XYZAL) 5 MG tablet Take 5 mg by mouth every evening.   Yes Historical Provider, MD  lithium carbonate (ESKALITH) 450 MG CR tablet TAKE 1 TABLET(450 MG) BY MOUTH AT BEDTIME 07/16/15  Yes Charlcie Cradle, MD  montelukast (SINGULAIR) 10 MG tablet Take 10 mg by mouth  at bedtime.  09/02/15  Yes Historical Provider, MD  ondansetron (ZOFRAN) 8 MG tablet Take 4-8 mg by mouth daily as needed for nausea or vomiting.    Yes Historical Provider, MD  valACYclovir (VALTREX) 500 MG tablet Take 500 mg by mouth daily. 08/26/15  Yes Historical Provider, MD  zonisamide (ZONEGRAN) 25 MG capsule Take 75 mg by mouth daily. 09/18/15  Yes Historical Provider, MD    Family History Family History  Problem Relation Age of Onset  . ADD / ADHD Sister   . Alcohol abuse Maternal Grandmother   . Bipolar disorder Maternal Grandmother   . Suicidality Neg Hx      Social History Social History  Substance Use Topics  . Smoking status: Current Every Day Smoker    Packs/day: 0.50    Years: 4.00    Types: Cigarettes  . Smokeless tobacco: Never Used     Comment: 8 cigarettes/day  . Alcohol use 0.0 oz/week     Comment: once a month or less     Allergies   Aloe; Penicillins; and Sulfa antibiotics   Review of Systems Review of Systems  All other systems reviewed and are negative.    Physical Exam Updated Vital Signs BP 113/75   Pulse 63   Temp 98 F (36.7 C) (Oral)   Resp 18   Ht 5\' 4"  (1.626 m)   Wt 217 lb (98.4 kg)   SpO2 99%   BMI 37.25 kg/m   Physical Exam  Constitutional: She is oriented to person, place, and time. She appears well-developed and well-nourished.  HENT:  Head: Normocephalic and atraumatic.  Eyes: Conjunctivae are normal.  Neck: Neck supple.  No meningeal signs  Cardiovascular: Normal rate and regular rhythm.   Pulmonary/Chest: Effort normal and breath sounds normal.  Abdominal: Soft. Bowel sounds are normal.  Musculoskeletal: Normal range of motion.  Neurological: She is alert and oriented to person, place, and time.  Skin: Skin is warm and dry.  Psychiatric: She has a normal mood and affect. Her behavior is normal.  Nursing note and vitals reviewed.    ED Treatments / Results  Labs (all labs ordered are listed, but only abnormal results are displayed) Labs Reviewed - No data to display  EKG  EKG Interpretation None       Radiology No results found.  Procedures Procedures (including critical care time)  Medications Ordered in ED Medications  sodium chloride 0.9 % bolus 1,000 mL (0 mLs Intravenous Stopped 09/26/15 1751)  ketorolac (TORADOL) 30 MG/ML injection 30 mg (30 mg Intravenous Given 09/26/15 1636)  diphenhydrAMINE (BENADRYL) injection 25 mg (25 mg Intravenous Given 09/26/15 1635)  metoCLOPramide (REGLAN) injection 10 mg (10 mg Intravenous Given 09/26/15 1638)     Initial  Impression / Assessment and Plan / ED Course  I have reviewed the triage vital signs and the nursing notes.  Pertinent labs & imaging results that were available during my care of the patient were reviewed by me and considered in my medical decision making (see chart for details).  Clinical Course    History and physical consistent with typical migraine headache. Patient responded well to IV fluids, IV Toradol, IV Benadryl, IV Reglan.  Final Clinical Impressions(s) / ED Diagnoses   Final diagnoses:  Migraine without aura and without status migrainosus, not intractable    New Prescriptions New Prescriptions   No medications on file     Nat Christen, MD 09/26/15 1839

## 2015-09-26 NOTE — ED Notes (Signed)
Patient able to ambulate independently  

## 2015-09-26 NOTE — ED Triage Notes (Signed)
Pt here for migraine since Monday, hx of same. She reports pain is worse with light. Pt reports trying to treat migraine at home with zofran, tylenol, and prescription meds for migraine but it was worse this morning. Nausea also reported.

## 2015-09-26 NOTE — ED Notes (Signed)
MD at bedside. 

## 2015-09-26 NOTE — ED Notes (Signed)
Pt requesting an update.  When in to check on her, states she is feeling better and is ready to go.  Spoke to MD

## 2015-09-26 NOTE — Discharge Instructions (Signed)
Follow-up your neurologist

## 2015-10-09 DIAGNOSIS — Z79899 Other long term (current) drug therapy: Secondary | ICD-10-CM | POA: Diagnosis not present

## 2015-10-12 DIAGNOSIS — G4733 Obstructive sleep apnea (adult) (pediatric): Secondary | ICD-10-CM | POA: Diagnosis not present

## 2015-10-12 DIAGNOSIS — G4700 Insomnia, unspecified: Secondary | ICD-10-CM | POA: Diagnosis not present

## 2015-10-13 DIAGNOSIS — H9202 Otalgia, left ear: Secondary | ICD-10-CM | POA: Diagnosis not present

## 2015-10-14 DIAGNOSIS — J3081 Allergic rhinitis due to animal (cat) (dog) hair and dander: Secondary | ICD-10-CM | POA: Diagnosis not present

## 2015-10-14 DIAGNOSIS — H9209 Otalgia, unspecified ear: Secondary | ICD-10-CM | POA: Diagnosis not present

## 2015-10-14 DIAGNOSIS — H903 Sensorineural hearing loss, bilateral: Secondary | ICD-10-CM | POA: Diagnosis not present

## 2015-10-14 DIAGNOSIS — J301 Allergic rhinitis due to pollen: Secondary | ICD-10-CM | POA: Diagnosis not present

## 2015-10-14 DIAGNOSIS — J3089 Other allergic rhinitis: Secondary | ICD-10-CM | POA: Diagnosis not present

## 2015-10-19 DIAGNOSIS — J3081 Allergic rhinitis due to animal (cat) (dog) hair and dander: Secondary | ICD-10-CM | POA: Diagnosis not present

## 2015-10-19 DIAGNOSIS — J301 Allergic rhinitis due to pollen: Secondary | ICD-10-CM | POA: Diagnosis not present

## 2015-10-19 DIAGNOSIS — J3089 Other allergic rhinitis: Secondary | ICD-10-CM | POA: Diagnosis not present

## 2015-10-20 DIAGNOSIS — F17200 Nicotine dependence, unspecified, uncomplicated: Secondary | ICD-10-CM | POA: Diagnosis not present

## 2015-10-21 DIAGNOSIS — G4482 Headache associated with sexual activity: Secondary | ICD-10-CM | POA: Diagnosis not present

## 2015-10-21 DIAGNOSIS — G518 Other disorders of facial nerve: Secondary | ICD-10-CM | POA: Diagnosis not present

## 2015-10-21 DIAGNOSIS — J3089 Other allergic rhinitis: Secondary | ICD-10-CM | POA: Diagnosis not present

## 2015-10-21 DIAGNOSIS — J3081 Allergic rhinitis due to animal (cat) (dog) hair and dander: Secondary | ICD-10-CM | POA: Diagnosis not present

## 2015-10-21 DIAGNOSIS — G43019 Migraine without aura, intractable, without status migrainosus: Secondary | ICD-10-CM | POA: Diagnosis not present

## 2015-10-21 DIAGNOSIS — J301 Allergic rhinitis due to pollen: Secondary | ICD-10-CM | POA: Diagnosis not present

## 2015-10-21 DIAGNOSIS — M791 Myalgia: Secondary | ICD-10-CM | POA: Diagnosis not present

## 2015-10-21 DIAGNOSIS — M542 Cervicalgia: Secondary | ICD-10-CM | POA: Diagnosis not present

## 2015-10-23 DIAGNOSIS — J301 Allergic rhinitis due to pollen: Secondary | ICD-10-CM | POA: Diagnosis not present

## 2015-10-23 DIAGNOSIS — J3089 Other allergic rhinitis: Secondary | ICD-10-CM | POA: Diagnosis not present

## 2015-10-25 ENCOUNTER — Other Ambulatory Visit (HOSPITAL_COMMUNITY): Payer: Self-pay | Admitting: Psychiatry

## 2015-10-25 DIAGNOSIS — F319 Bipolar disorder, unspecified: Secondary | ICD-10-CM

## 2015-10-28 DIAGNOSIS — J3081 Allergic rhinitis due to animal (cat) (dog) hair and dander: Secondary | ICD-10-CM | POA: Diagnosis not present

## 2015-10-28 DIAGNOSIS — J3089 Other allergic rhinitis: Secondary | ICD-10-CM | POA: Diagnosis not present

## 2015-10-28 DIAGNOSIS — J301 Allergic rhinitis due to pollen: Secondary | ICD-10-CM | POA: Diagnosis not present

## 2015-10-29 ENCOUNTER — Encounter (HOSPITAL_COMMUNITY): Payer: Self-pay | Admitting: Psychiatry

## 2015-10-29 ENCOUNTER — Ambulatory Visit (INDEPENDENT_AMBULATORY_CARE_PROVIDER_SITE_OTHER): Payer: BLUE CROSS/BLUE SHIELD | Admitting: Psychiatry

## 2015-10-29 DIAGNOSIS — F411 Generalized anxiety disorder: Secondary | ICD-10-CM | POA: Diagnosis not present

## 2015-10-29 DIAGNOSIS — F319 Bipolar disorder, unspecified: Secondary | ICD-10-CM | POA: Diagnosis not present

## 2015-10-29 MED ORDER — LITHIUM CARBONATE ER 450 MG PO TBCR: EXTENDED_RELEASE_TABLET | ORAL | 3 refills | Status: DC

## 2015-10-29 MED ORDER — DIVALPROEX SODIUM 500 MG PO DR TAB: 2000.0000 mg | DELAYED_RELEASE_TABLET | Freq: Every day | ORAL | 3 refills | Status: DC

## 2015-10-29 MED ORDER — FLUOXETINE HCL 20 MG PO CAPS: 20.0000 mg | ORAL_CAPSULE | Freq: Every day | ORAL | 3 refills | Status: DC

## 2015-10-29 MED ORDER — ALPRAZOLAM 0.5 MG PO TABS: 0.5000 mg | ORAL_TABLET | Freq: Every evening | ORAL | 3 refills | Status: DC | PRN

## 2015-10-29 NOTE — Progress Notes (Signed)
Patient ID: Margaret Lopez, female   DOB: 1970-10-09, 45 y.o.   MRN: [id number] Patient ID: Margaret Lopez, female   DOB: Feb 15, 1970, 45 y.o.   MRN: [id number]  [place] Progress Note  Margaret Lopez [id number] 45 y.o.  10/29/2015 8:45 AM  Chief Complaint: having some anxiety  History of Present Illness: Pt has restarted therapy and it is going well.   Reports her stress is very high regarding finances, boyfriend, sons and her boyfriend's son. She had 5 stress induced panic attacks and other times panic attack like symptoms but not full blown. She is taking Xanax 0.125-0.5mg  nightly to help her sleep or during the day if anxiety is really bad. Some days she takes Xanax during the day. Pt is having daily racing thoughts, nervousness, SOB and palpitations. She is having a dental problem due to severe TMJ. Pt has hives on her stomach that come and go due to anxiety. A lot of her anxiety is situational.   Depression seems to be getting better.  She is no longer tearful.  She is working on Isolation, worthlessness, hopelessness and anhedonia.  She is trying hard not to isolate herself.  It is struggle. She is overwhelmed more than depressed.    Sleep is good with Bipap and she is getting about 7 hrs a night. Energy remains low. Appetite is fair. Concentration is ok.   Denies manic and hypomanic symptoms including periods of decreased need for sleep, increased energy, mood lability, impulsivity, FOI, and excessive spending. Denies irritability. No longer engaging in sexual promiscuity.   Pt is taking Depakote, Lithium and Xanax as prescribed and denies SE.     Suicidal Ideation: No Plan Formed: No Patient has means to carry out plan: No  Homicidal Ideation: No Plan Formed: No Patient has means to carry out plan: No  Review of Systems: Psychiatric: Agitation: No Hallucination: No Depressed Mood: Yes Insomnia: No Hypersomnia: No Altered Concentration: No Feels Worthless:  Yes Grandiose Ideas: No Belief In Special Powers: No New/Increased Substance Abuse: No Compulsions: No  Neurologic: Headache: No  Seizure: No Paresthesias: No  Review of Systems  Constitutional: Negative for chills, fever and weight loss.  HENT: Negative for congestion, ear pain and sore throat.   Eyes: Negative for blurred vision, double vision and redness.  Respiratory: Negative for cough, shortness of breath and wheezing.   Cardiovascular: Negative for chest pain, palpitations and leg swelling.  Gastrointestinal: Negative for abdominal pain, heartburn, nausea and vomiting.  Musculoskeletal: Negative for back pain, joint pain and neck pain.  Skin: Negative for itching and rash.  Neurological: Negative for dizziness, tremors, focal weakness, seizures, loss of consciousness, weakness and headaches.  Endo/Heme/Allergies: Positive for environmental allergies.  Psychiatric/Behavioral: Positive for depression. Negative for hallucinations, substance abuse and suicidal ideas. The patient is nervous/anxious. The patient does not have insomnia.       Past Medical Family, Social History: Margaret Lopez in own house with her 2 sons. Seperated from husband. Smokes 1/2-1 ppd, denies regular alcohol use. Denies illicit substance abuse.  Family History  Problem Relation Age of Onset  . ADD / ADHD Sister   . Alcohol abuse Maternal Grandmother   . Bipolar disorder Maternal Grandmother   . Suicidality Neg Hx     Past Medical History:  Diagnosis Date  . Anxiety   . Bipolar disorder ([place])   . Depression   . Deviated nasal septum 02/2011  . GERD (gastroesophageal reflux disease)   . Headache(784.0)  migraines  . Nasal turbinate hypertrophy 02/2011   bilat.  . Seasonal allergies   . Sleep apnea    uses BIPAP nightly    Outpatient Encounter Prescriptions as of 10/29/2015  Medication Sig  . acetaminophen (TYLENOL) 500 MG tablet Take 1,000 mg by mouth daily as needed for headache.  .  ALPRAZolam (XANAX) 0.5 MG tablet Take 1 tablet (0.5 mg total) by mouth at bedtime as needed for anxiety. (Patient taking differently: Take 0.125 mg by mouth at bedtime as needed for anxiety. )  . azelastine (OPTIVAR) 0.05 % ophthalmic solution Place 1 drop into both eyes as needed (for itchy eyes).   . chlorproMAZINE (THORAZINE) 25 MG tablet Take 25-50 mg by mouth as needed (for headache).  . divalproex (DEPAKOTE) 500 MG DR tablet Take 4 tablets (2,000 mg total) by mouth at bedtime.  [name] EPINEPHrine 0.3 mg/0.3 mL IJ SOAJ injection Inject 0.3 mg into the muscle once.   [name] esomeprazole (NEXIUM) 40 MG capsule Take 40 mg by mouth 2 (two) times daily.  . hydrOXYzine (ATARAX/VISTARIL) 25 MG tablet Take 75 mg by mouth every 8 (eight) hours as needed for itching.   . levocetirizine (XYZAL) 5 MG tablet Take 5 mg by mouth every evening.  . lithium carbonate (ESKALITH) 450 MG CR tablet TAKE 1 TABLET(450 MG) BY MOUTH AT BEDTIME  . montelukast (SINGULAIR) 10 MG tablet Take 10 mg by mouth at bedtime.   . ondansetron (ZOFRAN) 8 MG tablet Take 4-8 mg by mouth daily as needed for nausea or vomiting.   . valACYclovir (VALTREX) 500 MG tablet Take 500 mg by mouth daily.  [name] zonisamide (ZONEGRAN) 25 MG capsule Take 75 mg by mouth daily.   No facility-administered encounter medications on file as of 10/29/2015.     Past Psychiatric History/Hospitalization(s): Anxiety: Yes Bipolar Disorder: Yes Depression: Yes Mania: Yes Psychosis: No Schizophrenia: No Personality Disorder: No Hospitalization for psychiatric illness: No History of Electroconvulsive Shock Therapy: No Prior Suicide Attempts: No  Physical Exam: Constitutional:  BP 126/72   Pulse 85   Ht 5\' 4"  (1.626 m)   Wt 218 lb 9.6 oz (99.2 kg)   BMI 37.52 kg/m   General Appearance: alert, oriented, no acute distress  Musculoskeletal: Strength & Muscle Tone: within normal limits Gait & Station: normal Patient leans: straight  Mental Status  Examination/Evaluation: Objective: Attitude: Calm and cooperative  Appearance: Casual, appears to be stated age  Eye Contact::  Fair  Speech:  Clear and Coherent and Normal Rate  Volume:  Normal  Mood:  Anxious and depressed  Affect:  Constricted  Thought Process:  Linear and Logical  Orientation:  Full (Time, Place, and Person)  Thought Content:  Negative  Suicidal Thoughts:  No  Homicidal Thoughts:  No  Judgement:  Fair  Insight:  Fair  Concentration: good  Memory: Immediate-fair Recent-fair Remote-fair  Recall: fair  Language: fair  Gait and Station: normal  [employer] of Knowledge: average  Psychomotor Activity:  Normal  Akathisia:  No  Handed:  Right  AIMS (if indicated):  Facial and Oral Movements  Muscles of Facial Expression: None, normal  Lips and Perioral Area: None, normal  Jaw: None, normal  Tongue: None, normal Extremity Movements: Upper (arms, wrists, hands, fingers): None, normal  Lower (legs, knees, ankles, toes): None, normal,  Trunk Movements:  Neck, shoulders, hips: None, normal,  Overall Severity : Severity of abnormal movements (highest score from questions above): None, normal  Incapacitation due to abnormal movements: None, normal  Patient's awareness of abnormal movements (rate only patient's report): No Awareness, Dental Status  Current problems with teeth and/or dentures?: No  Does patient usually wear dentures?: No    Assets: Housing Communication skills Desire for improvement      Assessment: Axis I: Bipolar- depressed, recurrent episode and mild; GAD, Nicotine Dependence  Axis II: deferred     Plan: Continue Depakote 2000mg  po qD for mood stabalization.  Continue Xanax 0.5mg  po qD prn anxiety  Lithium CR 450mg  po qD for mood -start trial of Prozac 20mg  po qD for mood and anxiety -Pt requires multiple mood stabalizers to keep her mood level. She has shown significantly disruptive symptoms when treated with only one mood  stabalizer. Pt is tolerating the regime without SE.    Medication management with supportive therapy. Risks/benefits and SE of the medication discussed. Pt verbalized understanding and verbal consent obtained for treatment.  Affirm with the patient that the medications are taken as ordered. Patient expressed understanding of how their medications were to be used.  -improvement of depression symptoms and worsening of anxiety. Pt doesn't want meds changed today.   Labs: 10/07/2015 Valproic acid 48, lithium 0.3, TSH 2.74, CMP WNL, CBC WNL   Therapy: brief supportive therapy provided. Discussed psychosocial stressors in detail.    -pt would like to stop smoking and has started Chantix as prescribed by PCP. Will continue to monitor for symptom change.   Pt denies SI and is at an acute low risk for suicide.Patient told to call clinic if any problems occur. Patient advised to go to ER  if she should develop SI/HI, side effects, or if symptoms worsen. Has crisis numbers to call if needed. Pt verbalized understanding.  F/up in 3 months or sooner if needed  Charlcie Cradle, MD 10/29/2015

## 2015-11-04 DIAGNOSIS — G43019 Migraine without aura, intractable, without status migrainosus: Secondary | ICD-10-CM | POA: Diagnosis not present

## 2015-11-04 DIAGNOSIS — G518 Other disorders of facial nerve: Secondary | ICD-10-CM | POA: Diagnosis not present

## 2015-11-04 DIAGNOSIS — M542 Cervicalgia: Secondary | ICD-10-CM | POA: Diagnosis not present

## 2015-11-04 DIAGNOSIS — M791 Myalgia: Secondary | ICD-10-CM | POA: Diagnosis not present

## 2015-11-04 DIAGNOSIS — G4482 Headache associated with sexual activity: Secondary | ICD-10-CM | POA: Diagnosis not present

## 2015-11-06 DIAGNOSIS — J3081 Allergic rhinitis due to animal (cat) (dog) hair and dander: Secondary | ICD-10-CM | POA: Diagnosis not present

## 2015-11-06 DIAGNOSIS — J3089 Other allergic rhinitis: Secondary | ICD-10-CM | POA: Diagnosis not present

## 2015-11-06 DIAGNOSIS — J301 Allergic rhinitis due to pollen: Secondary | ICD-10-CM | POA: Diagnosis not present

## 2015-11-12 ENCOUNTER — Telehealth (HOSPITAL_COMMUNITY): Payer: Self-pay

## 2015-11-12 NOTE — Telephone Encounter (Signed)
It was for her increased anxiety. If SE are overwhelming tell her to stop Prozac. In a few weeks we can discuss other options.

## 2015-11-12 NOTE — Telephone Encounter (Signed)
Patient is calling, she states that ever since starting Prozac she has been extremely tired, she has a lack of energy and low motivation. Patient says overall she just feels worse. Please review and advise, thank you

## 2015-11-13 NOTE — Telephone Encounter (Signed)
Called patient and told her to stop the Prozac and to call back if her anxiety gets worse. Patient voiced her understanding.

## 2015-11-18 DIAGNOSIS — J3089 Other allergic rhinitis: Secondary | ICD-10-CM | POA: Diagnosis not present

## 2015-11-18 DIAGNOSIS — J301 Allergic rhinitis due to pollen: Secondary | ICD-10-CM | POA: Diagnosis not present

## 2015-11-18 DIAGNOSIS — J3081 Allergic rhinitis due to animal (cat) (dog) hair and dander: Secondary | ICD-10-CM | POA: Diagnosis not present

## 2015-11-25 DIAGNOSIS — J3089 Other allergic rhinitis: Secondary | ICD-10-CM | POA: Diagnosis not present

## 2015-11-25 DIAGNOSIS — G43019 Migraine without aura, intractable, without status migrainosus: Secondary | ICD-10-CM | POA: Diagnosis not present

## 2015-11-25 DIAGNOSIS — M791 Myalgia: Secondary | ICD-10-CM | POA: Diagnosis not present

## 2015-11-25 DIAGNOSIS — J301 Allergic rhinitis due to pollen: Secondary | ICD-10-CM | POA: Diagnosis not present

## 2015-11-25 DIAGNOSIS — G518 Other disorders of facial nerve: Secondary | ICD-10-CM | POA: Diagnosis not present

## 2015-11-25 DIAGNOSIS — M542 Cervicalgia: Secondary | ICD-10-CM | POA: Diagnosis not present

## 2015-11-25 DIAGNOSIS — G4482 Headache associated with sexual activity: Secondary | ICD-10-CM | POA: Diagnosis not present

## 2015-11-25 DIAGNOSIS — J3081 Allergic rhinitis due to animal (cat) (dog) hair and dander: Secondary | ICD-10-CM | POA: Diagnosis not present

## 2015-12-06 DIAGNOSIS — J0100 Acute maxillary sinusitis, unspecified: Secondary | ICD-10-CM | POA: Diagnosis not present

## 2015-12-06 DIAGNOSIS — J029 Acute pharyngitis, unspecified: Secondary | ICD-10-CM | POA: Diagnosis not present

## 2015-12-22 DIAGNOSIS — M542 Cervicalgia: Secondary | ICD-10-CM | POA: Diagnosis not present

## 2015-12-22 DIAGNOSIS — M791 Myalgia: Secondary | ICD-10-CM | POA: Diagnosis not present

## 2015-12-22 DIAGNOSIS — G43019 Migraine without aura, intractable, without status migrainosus: Secondary | ICD-10-CM | POA: Diagnosis not present

## 2015-12-22 DIAGNOSIS — G518 Other disorders of facial nerve: Secondary | ICD-10-CM | POA: Diagnosis not present

## 2015-12-22 DIAGNOSIS — G4482 Headache associated with sexual activity: Secondary | ICD-10-CM | POA: Diagnosis not present

## 2015-12-24 DIAGNOSIS — G4700 Insomnia, unspecified: Secondary | ICD-10-CM | POA: Diagnosis not present

## 2015-12-24 DIAGNOSIS — G4733 Obstructive sleep apnea (adult) (pediatric): Secondary | ICD-10-CM | POA: Diagnosis not present

## 2016-01-08 DIAGNOSIS — G4700 Insomnia, unspecified: Secondary | ICD-10-CM | POA: Diagnosis not present

## 2016-01-08 DIAGNOSIS — G4733 Obstructive sleep apnea (adult) (pediatric): Secondary | ICD-10-CM | POA: Diagnosis not present

## 2016-01-14 ENCOUNTER — Other Ambulatory Visit (HOSPITAL_COMMUNITY): Payer: Self-pay | Admitting: Surgery

## 2016-01-22 ENCOUNTER — Ambulatory Visit (HOSPITAL_COMMUNITY)
Admission: RE | Admit: 2016-01-22 | Discharge: 2016-01-22 | Disposition: A | Discharge status: Home / Self Care | Payer: BLUE CROSS/BLUE SHIELD | Source: Ambulatory Visit | Attending: Surgery | Admitting: Surgery

## 2016-01-22 DIAGNOSIS — Z01818 Encounter for other preprocedural examination: Secondary | ICD-10-CM | POA: Diagnosis not present

## 2016-01-27 ENCOUNTER — Ambulatory Visit: Payer: Self-pay | Admitting: Dietician

## 2016-01-28 ENCOUNTER — Ambulatory Visit (HOSPITAL_COMMUNITY): Payer: Self-pay | Admitting: Psychiatry

## 2016-02-03 DIAGNOSIS — F509 Eating disorder, unspecified: Secondary | ICD-10-CM | POA: Diagnosis not present

## 2016-02-05 ENCOUNTER — Encounter: Payer: BLUE CROSS/BLUE SHIELD | Attending: Surgery | Admitting: Skilled Nursing Facility1

## 2016-02-05 DIAGNOSIS — E6609 Other obesity due to excess calories: Secondary | ICD-10-CM

## 2016-02-05 DIAGNOSIS — Z713 Dietary counseling and surveillance: Secondary | ICD-10-CM | POA: Insufficient documentation

## 2016-02-05 NOTE — Progress Notes (Signed)
Pre-Op Assessment Visit:  Pre-Operative Roux-En-Y Surgery  Medical Nutrition Therapy:  Appt start time: 2:15   End time:  3:15  Patient was seen on 02/05/2016 for Pre-Operative Nutrition Assessment. Assessment and letter of approval faxed to [facility] Bariatric Surgery Program coordinator on 02/05/2016.   Surgery type: Roux-En-Y Start weight at [facility]: 227.2 pounds BMI: 40.1 Ht: 63.25  24 hr Dietary Recall: First Meal: cereal Snack: pop tart Second Meal: frozen meals Snack: oreos  Third Meal: tacos Snack: ice cream  Encouraged to engage in 30 minutes of moderate physical activity including cardiovascular and weight baring weekly  Handouts given during visit include:  . Pre-Op Goals . Bariatric Surgery Protein Shakes During the appointment today the following Pre-Op Goals were reviewed with the patient: . Maintain or lose weight as instructed by your surgeon . Make healthy food choices . Begin to limit portion sizes . Limited concentrated sugars and fried foods . Keep fat/sugar in the single digits per serving on         food labels . Practice CHEWING your food  (aim for 30 chews per bite or until applesauce consistency) . Practice not drinking 15 minutes before, during, and 30 minutes after each meal/snack . Avoid all carbonated beverages  . Avoid/limit caffeinated beverages  . Avoid all sugar-sweetened beverages . Consume 3 meals per day; eat every 3-5 hours . Make a list of non-food related activities . Aim for 64-100 ounces of FLUID daily  . Aim for at least 60-80 grams of PROTEIN daily . Look for a liquid protein source that contain ?15 g protein and ?5 g carbohydrate  (ex: shakes, drinks, shots)  -Follow diet recommendations listed below   Energy and Macronutrient Recomendations: Calories: 1600 Carbohydrate: 180 Protein: 120 Fat: 44  Demonstrated degree of understanding via:  Teach Back  Teaching Method Utilized:  Visual Auditory Hands  on  Barriers to learning/adherence to lifestyle change: none identified   Patient to call the Nutrition [facility] Education [facility] to enroll in Pre-Op and Post-Op Nutrition Education when surgery date is scheduled.

## 2016-02-08 ENCOUNTER — Telehealth (HOSPITAL_COMMUNITY): Payer: Self-pay

## 2016-02-08 ENCOUNTER — Encounter: Payer: Self-pay | Admitting: Skilled Nursing Facility1

## 2016-02-08 NOTE — Telephone Encounter (Signed)
Patient called and states that at her visit in October you discussed her getting weight loss surgery and the effects on her medications. This is not dictated and patients physician needs it to be in the dictation that it was discussed. I offered to see if we could do a letter and patient said she would prefer for insurance purposes If the note was addended. I told her I would speak with you and call her back.

## 2016-02-09 NOTE — Telephone Encounter (Signed)
It will have to be dicussed at her next follow up. I can not go back and append the note 4 months later

## 2016-02-09 NOTE — Telephone Encounter (Signed)
Patient has a follow up on 2/13, I called her and advised that Dr. Doyne Keel would be happy to discuss at that time.

## 2016-02-10 DIAGNOSIS — Z713 Dietary counseling and surveillance: Secondary | ICD-10-CM | POA: Diagnosis not present

## 2016-02-10 DIAGNOSIS — F17200 Nicotine dependence, unspecified, uncomplicated: Secondary | ICD-10-CM | POA: Diagnosis not present

## 2016-02-10 DIAGNOSIS — E669 Obesity, unspecified: Secondary | ICD-10-CM | POA: Diagnosis not present

## 2016-02-23 ENCOUNTER — Encounter (HOSPITAL_COMMUNITY): Payer: Self-pay | Admitting: Psychiatry

## 2016-02-23 ENCOUNTER — Ambulatory Visit (INDEPENDENT_AMBULATORY_CARE_PROVIDER_SITE_OTHER): Payer: BLUE CROSS/BLUE SHIELD | Admitting: Psychiatry

## 2016-02-23 DIAGNOSIS — F411 Generalized anxiety disorder: Secondary | ICD-10-CM

## 2016-02-23 DIAGNOSIS — F319 Bipolar disorder, unspecified: Secondary | ICD-10-CM | POA: Diagnosis not present

## 2016-02-23 DIAGNOSIS — F17210 Nicotine dependence, cigarettes, uncomplicated: Secondary | ICD-10-CM

## 2016-02-23 DIAGNOSIS — Z811 Family history of alcohol abuse and dependence: Secondary | ICD-10-CM

## 2016-02-23 DIAGNOSIS — Z818 Family history of other mental and behavioral disorders: Secondary | ICD-10-CM

## 2016-02-23 DIAGNOSIS — Z79899 Other long term (current) drug therapy: Secondary | ICD-10-CM

## 2016-02-23 MED ORDER — DIVALPROEX SODIUM 500 MG PO DR TAB: 2000.0000 mg | DELAYED_RELEASE_TABLET | Freq: Every day | ORAL | 3 refills | Status: DC

## 2016-02-23 MED ORDER — LITHIUM CARBONATE ER 450 MG PO TBCR: EXTENDED_RELEASE_TABLET | ORAL | 3 refills | Status: DC

## 2016-02-23 MED ORDER — ALPRAZOLAM 0.5 MG PO TABS: 0.5000 mg | ORAL_TABLET | Freq: Every evening | ORAL | 3 refills | Status: DC | PRN

## 2016-02-23 NOTE — Progress Notes (Signed)
Patient ID: Margaret Lopez, female   DOB: [birth year], 46 y.o.   MRN: [id number] Patient ID: Margaret Lopez, female   DOB: 1970/09/11, 46 y.o.   MRN: [id number]  [place] [id number] Progress Note  Margaret Lopez [id number] 45 y.o.  02/23/2016 9:44 AM  Chief Complaint: "Prozac was killing my stomach. I feel kinda normal for the first time in a while"  History of Present Illness: reviewed information below with patient on 02/23/16 and same as previous visits except as noted  Pt has gone to a couple of therapy appts but has not gone back. States she was busy and forgot.   A lot of her anxiety is situational. States it seems appropriate to her. She has had 1-2 panic attacks since the last visit.   "I feel a whole lot better these days". States relationships are coming together with her kids and boyfriend. Depression is mild and tolerable. She feels down once a month. States her stress tolerance is better.   Sleep is better with Bipap and she is getting about 6-7 hrs a night. She is taking Xanax 1 tab at night. Energy is ok. Appetite is fair. Concentration is ok.   Denies manic and hypomanic symptoms including periods of decreased need for sleep, increased energy, mood lability, impulsivity, FOI, and excessive spending.  Pt is taking Depakote, Lithium and Xanax as prescribed and denies SE.     Suicidal Ideation: No Plan Formed: No Patient has means to carry out plan: No  Homicidal Ideation: No Plan Formed: No Patient has means to carry out plan: No  Review of Systems: Psychiatric: Agitation: No Hallucination: No Depressed Mood: Yes Insomnia: No Hypersomnia: No Altered Concentration: No Feels Worthless: Yes Grandiose Ideas: No Belief In Special Powers: No New/Increased Substance Abuse: No Compulsions: No  Neurologic: Headache: No  Seizure: No Paresthesias: No  Review of Systems  Constitutional: Negative for chills, diaphoresis, fever and malaise/fatigue.   Musculoskeletal: Positive for back pain. Negative for falls, joint pain and neck pain.  Neurological: Negative for dizziness, tremors, seizures, loss of consciousness and headaches.  Psychiatric/Behavioral: Positive for depression. Negative for hallucinations, substance abuse and suicidal ideas. The patient is nervous/anxious. The patient does not have insomnia.       Past Medical Family, Social History: [name] in own house with her 2 sons. Seperated from husband. Smokes 1/2-1 ppd, denies regular alcohol use. Denies illicit substance abuse.  Family History  Problem Relation Age of Onset  . ADD / ADHD Sister   . Alcohol abuse Maternal Grandmother   . Bipolar disorder Maternal Grandmother   . Suicidality Neg Hx     Past Medical History:  Diagnosis Date  . Anxiety   . Bipolar disorder ([place])   . Depression   . Deviated nasal septum 02/2011  . GERD (gastroesophageal reflux disease)   . Headache(784.0)    migraines  . Nasal turbinate hypertrophy 02/2011   bilat.  . Seasonal allergies   . Sleep apnea    uses BIPAP nightly    Outpatient Encounter Prescriptions as of 02/23/2016  Medication Sig  . acetaminophen (TYLENOL) 500 MG tablet Take 1,000 mg by mouth daily as needed for headache.  . ALPRAZolam (XANAX) 0.5 MG tablet Take 1 tablet (0.5 mg total) by mouth at bedtime as needed for anxiety.  [name] azelastine (OPTIVAR) 0.05 % ophthalmic solution Place 1 drop into both eyes as needed (for itchy eyes).   . chlorproMAZINE (THORAZINE) 25 MG tablet Take 25-50 mg by mouth  as needed (for headache).  . divalproex (DEPAKOTE) 500 MG DR tablet Take 4 tablets (2,000 mg total) by mouth at bedtime.  [name] EPINEPHrine 0.3 mg/0.3 mL IJ SOAJ injection Inject 0.3 mg into the muscle once.   [name] esomeprazole (NEXIUM) 40 MG capsule Take 40 mg by mouth 2 (two) times daily.  . hydrOXYzine (ATARAX/VISTARIL) 25 MG tablet Take 75 mg by mouth every 8 (eight) hours as needed for itching.   . levocetirizine (XYZAL) 5 MG  tablet Take 5 mg by mouth every evening.  . lithium carbonate (ESKALITH) 450 MG CR tablet TAKE 1 TABLET(450 MG) BY MOUTH AT BEDTIME  . montelukast (SINGULAIR) 10 MG tablet Take 10 mg by mouth at bedtime.   . ondansetron (ZOFRAN) 8 MG tablet Take 4-8 mg by mouth daily as needed for nausea or vomiting.   . valACYclovir (VALTREX) 500 MG tablet Take 500 mg by mouth daily.  . Varenicline Tartrate (CHANTIX STARTING MONTH PAK PO) Take by mouth.  . zonisamide (ZONEGRAN) 25 MG capsule Take 75 mg by mouth daily.  [name] FLUoxetine (PROZAC) 20 MG capsule Take 1 capsule (20 mg total) by mouth daily. (Patient not taking: Reported on 02/23/2016)   No facility-administered encounter medications on file as of 02/23/2016.     Past Psychiatric History/Hospitalization(s): Anxiety: Yes Bipolar Disorder: Yes Depression: Yes Mania: Yes Psychosis: No Schizophrenia: No Personality Disorder: No Hospitalization for psychiatric illness: No History of Electroconvulsive Shock Therapy: No Prior Suicide Attempts: No  Physical Exam: Constitutional:  BP 130/82   Pulse 80   Ht 5\' 3"  (1.6 m)   Wt 227 lb (103 kg)   BMI 40.21 kg/m   General Appearance: alert, oriented, no acute distress  Musculoskeletal: Strength & Muscle Tone: within normal limits Gait & Station: normal Patient leans: straight  Mental Status Examination/Evaluation: reviewed MSE on 02/23/16 and same as previous visits except as noted  Objective: Attitude: Calm and cooperative  Appearance: Casual, appears to be stated age  Eye Contact::  Fair  Speech:  Clear and Coherent and Normal Rate  Volume:  Normal  Mood:  euthymic  Affect:  Constricted  Thought Process:  Linear and Logical  Orientation:  Full (Time, Place, and Person)  Thought Content:  Negative  Suicidal Thoughts:  No  Homicidal Thoughts:  No  Judgement:  Fair  Insight:  Fair  Concentration: good  Memory: Immediate-fair Recent-fair Remote-fair  Recall: fair  Language: fair   Gait and Station: normal  [employer] of Knowledge: average  Psychomotor Activity:  Normal  Akathisia:  No  Handed:  Right  AIMS (if indicated):  Facial and Oral Movements  Muscles of Facial Expression: None, normal  Lips and Perioral Area: None, normal  Jaw: None, normal  Tongue: None, normal Extremity Movements: Upper (arms, wrists, hands, fingers): None, normal  Lower (legs, knees, ankles, toes): None, normal,  Trunk Movements:  Neck, shoulders, hips: None, normal,  Overall Severity : Severity of abnormal movements (highest score from questions above): None, normal  Incapacitation due to abnormal movements: None, normal  Patient's awareness of abnormal movements (rate only patient's report): No Awareness, Dental Status  Current problems with teeth and/or dentures?: No  Does patient usually wear dentures?: No    Assets: Housing Communication skills Desire for improvement     reviewed A&P on 02/23/16 and same as previous visits except as noted  Assessment: Axis I: Bipolar- depressed, recurrent episode and mild; GAD, Nicotine Dependence  Axis II: deferred     Plan: Continue Depakote 2000mg   po qD for mood stabalization.  Continue Xanax 0.5mg  po qD prn anxiety  Lithium CR 450mg  po qD for mood -Pt requires multiple mood stabalizers to keep her mood level. She has shown significantly disruptive symptoms when treated with only one mood stabalizer. Pt is tolerating the regime without SE.    Medication management with supportive therapy. Risks/benefits and SE of the medication discussed. Pt verbalized understanding and verbal consent obtained for treatment.  Affirm with the patient that the medications are taken as ordered. Patient expressed understanding of how their medications were to be used.  -improvement of depression symptoms and worsening of anxiety. Pt doesn't want meds changed today.   Labs: 10/07/2015 Valproic acid 48, lithium 0.3, TSH 2.74, CMP WNL, CBC  WNL   Therapy: brief supportive therapy provided. Discussed psychosocial stressors in detail.    -pt would like to stop smoking and has started Chantix as prescribed by PCP. States she will continue for another 2 months and then stop per PCP recommendations. Will continue to monitor for symptom change.   Pt denies SI and is at an acute low risk for suicide.Patient told to call clinic if any problems occur. Patient advised to go to ER  if she should develop SI/HI, side effects, or if symptoms worsen. Has crisis numbers to call if needed. Pt verbalized understanding.  F/up in 3 months or sooner if needed  Charlcie Cradle, MD 02/23/2016

## 2016-03-02 DIAGNOSIS — F509 Eating disorder, unspecified: Secondary | ICD-10-CM | POA: Diagnosis not present

## 2016-03-04 DIAGNOSIS — R05 Cough: Secondary | ICD-10-CM | POA: Diagnosis not present

## 2016-03-04 DIAGNOSIS — J3089 Other allergic rhinitis: Secondary | ICD-10-CM | POA: Diagnosis not present

## 2016-03-04 DIAGNOSIS — J301 Allergic rhinitis due to pollen: Secondary | ICD-10-CM | POA: Diagnosis not present

## 2016-03-04 DIAGNOSIS — J3081 Allergic rhinitis due to animal (cat) (dog) hair and dander: Secondary | ICD-10-CM | POA: Diagnosis not present

## 2016-03-18 DIAGNOSIS — R0981 Nasal congestion: Secondary | ICD-10-CM | POA: Diagnosis not present

## 2016-03-18 DIAGNOSIS — R05 Cough: Secondary | ICD-10-CM | POA: Diagnosis not present

## 2016-03-22 DIAGNOSIS — F17200 Nicotine dependence, unspecified, uncomplicated: Secondary | ICD-10-CM | POA: Diagnosis not present

## 2016-03-22 DIAGNOSIS — E669 Obesity, unspecified: Secondary | ICD-10-CM | POA: Diagnosis not present

## 2016-03-22 DIAGNOSIS — J189 Pneumonia, unspecified organism: Secondary | ICD-10-CM | POA: Diagnosis not present

## 2016-03-23 DIAGNOSIS — G43019 Migraine without aura, intractable, without status migrainosus: Secondary | ICD-10-CM | POA: Diagnosis not present

## 2016-03-23 DIAGNOSIS — G4482 Headache associated with sexual activity: Secondary | ICD-10-CM | POA: Diagnosis not present

## 2016-04-05 DIAGNOSIS — R05 Cough: Secondary | ICD-10-CM | POA: Diagnosis not present

## 2016-04-05 DIAGNOSIS — H9209 Otalgia, unspecified ear: Secondary | ICD-10-CM | POA: Diagnosis not present

## 2016-04-05 DIAGNOSIS — H7203 Central perforation of tympanic membrane, bilateral: Secondary | ICD-10-CM | POA: Diagnosis not present

## 2016-04-05 DIAGNOSIS — H6983 Other specified disorders of Eustachian tube, bilateral: Secondary | ICD-10-CM | POA: Diagnosis not present

## 2016-04-22 DIAGNOSIS — Z713 Dietary counseling and surveillance: Secondary | ICD-10-CM | POA: Diagnosis not present

## 2016-04-22 DIAGNOSIS — B353 Tinea pedis: Secondary | ICD-10-CM | POA: Diagnosis not present

## 2016-05-04 NOTE — Patient Instructions (Addendum)
Margaret Lopez  05/04/2016   Your procedure is scheduled on: 05/16/2016    Report to [facility]  Entrance and take [facility] elevators to 3rd Floor to Short Stay center at 0530am.       Call this number if you have problems the morning of surgery [phone]    Remember: ONLY 1 PERSON MAY GO WITH YOU TO SHORT STAY TO GET  READY MORNING OF [place].  Do not eat food or drink liquids :After Midnight.     Take these medicines the morning of surgery with A SIP OF WATER: Albuterol Inhaler if needed and bring, eye drops as usual, Nexium   Incentive Spirometer  An incentive spirometer is a tool that can help keep your lungs clear and active. This tool measures how well you are filling your lungs with each breath. Taking long deep breaths may help reverse or decrease the chance of developing breathing (pulmonary) problems (especially infection) following:  A long period of time when you are unable to move or be active. BEFORE THE PROCEDURE   If the spirometer includes an indicator to show your best effort, your nurse or respiratory therapist will set it to a desired goal.  If possible, sit up straight or lean slightly forward. Try not to slouch.  Hold the incentive spirometer in an upright position. INSTRUCTIONS FOR USE  1. Sit on the edge of your bed if possible, or sit up as far as you can in bed or on a chair. 2. Hold the incentive spirometer in an upright position. 3. Breathe out normally. 4. Place the mouthpiece in your mouth and seal your lips tightly around it. 5. Breathe in slowly and as deeply as possible, raising the piston or the ball toward the top of the column. 6. Hold your breath for 3-5 seconds or for as long as possible. Allow the piston or ball to fall to the bottom of the column. 7. Remove the mouthpiece from your mouth and breathe out normally. 8. Rest for a few seconds and repeat Steps 1 through 7 at least 10 times every 1-2 hours when you  are awake. Take your time and take a few normal breaths between deep breaths. 9. The spirometer may include an indicator to show your best effort. Use the indicator as a goal to work toward during each repetition. 10. After each set of 10 deep breaths, practice coughing to be sure your lungs are clear. If you have an incision (the cut made at the time of surgery), support your incision when coughing by placing a pillow or rolled up towels firmly against it. Once you are able to get out of bed, walk around indoors and cough well. You may stop using the incentive spirometer when instructed by your caregiver.  RISKS AND COMPLICATIONS  Take your time so you do not get dizzy or light-headed.  If you are in pain, you may need to take or ask for pain medication before doing incentive spirometry. It is harder to take a deep breath if you are having pain. AFTER USE  Rest and breathe slowly and easily.  It can be helpful to keep track of a log of your progress. Your caregiver can provide you with a simple table to help with this. If you are using the spirometer at home, follow these instructions: [place] IF:   You are having difficultly using the  spirometer.  You have trouble using the spirometer as often as instructed.  Your pain medication is not giving enough relief while using the spirometer.  You develop fever of 100.5 F (38.1 C) or higher. SEEK IMMEDIATE MEDICAL CARE IF:   You cough up bloody sputum that had not been present before.  You develop fever of 102 F (38.9 C) or greater.  You develop worsening pain at or near the incision site. MAKE SURE YOU:   Understand these instructions.  Will watch your condition.  Will get help right away if you are not doing well or get worse. Document Released: 05/09/2006 Document Revised: 03/21/2011 Document Reviewed: 07/10/2006 ExitCare Patient Information 2014 ExitCare,  [place].   ________________________________________________________________________                                 Dennis Bast may not have any metal on your body including hair pins and              piercings  Do not wear jewelry, make-up, lotions, powders or perfumes, deodorant             Do not wear nail polish.  Do not shave  48 hours prior to surgery.                Do not bring valuables to the hospital. [place].  Contacts, dentures or bridgework may not be worn into surgery.  Leave suitcase in the car. After surgery it may be brought to your room.   Practice coughing and deep breathing exercises, leg exercises                   Please read over the following fact sheets you were given: _____________________________________________________________________             [facility] - Preparing for Surgery Before surgery, you can play an important role.  Because skin is not sterile, your skin needs to be as free of germs as possible.  You can reduce the number of germs on your skin by washing with CHG (chlorahexidine gluconate) soap before surgery.  CHG is an antiseptic cleaner which kills germs and bonds with the skin to continue killing germs even after washing. Please DO NOT use if you have an allergy to CHG or antibacterial soaps.  If your skin becomes reddened/irritated stop using the CHG and inform your nurse when you arrive at Short Stay. Do not shave (including legs and underarms) for at least 48 hours prior to the first CHG shower.  You may shave your face/neck. Please follow these instructions carefully:  1.  Shower with CHG Soap the night before surgery and the  morning of Surgery.  2.  If you choose to wash your hair, wash your hair first as usual with your  normal  shampoo.  3.  After you shampoo, rinse your hair and body thoroughly to remove the  shampoo.                           4.  Use CHG as you would any other liquid  soap.  You can apply chg directly  to the skin and wash  Gently with a scrungie or clean washcloth.  5.  Apply the CHG Soap to your body ONLY FROM THE NECK DOWN.   Do not use on face/ open                           Wound or open sores. Avoid contact with eyes, ears mouth and genitals (private parts).                       Wash face,  Genitals (private parts) with your normal soap.             6.  Wash thoroughly, paying special attention to the area where your surgery  will be performed.  7.  Thoroughly rinse your body with warm water from the neck down.  8.  DO NOT shower/wash with your normal soap after using and rinsing off  the CHG Soap.                9.  Pat yourself dry with a clean towel.            10.  Wear clean pajamas.            11.  Place clean sheets on your bed the night of your first shower and do not  sleep with pets. Day of Surgery : Do not apply any lotions/deodorants the morning of surgery.  Please wear clean clothes to the hospital/surgery center.  FAILURE TO FOLLOW THESE INSTRUCTIONS MAY RESULT IN THE CANCELLATION OF YOUR SURGERY PATIENT SIGNATURE_________________________________  NURSE SIGNATURE__________________________________  ________________________________________________________________________

## 2016-05-06 ENCOUNTER — Ambulatory Visit: Payer: Self-pay | Admitting: Surgery

## 2016-05-06 NOTE — H&P (Signed)
Margaret Lopez Patient #: [id number] DOB: 1970-01-12 Separated / Language: [name] / Race: White Female   History of Present Illness  Patient words: She is attended the bariatric seminar and is interested in Roux-en-Y gastric bypass. She has significant reflux and takes high-dose PPI with occasional breakthrough symptoms. She stopped smoking on November 13 2015 using Chantix.  The patient is a 46 year old female who presents for a bariatric surgery evaluation. Associated symptoms include depressed mood, poor self esteem, joint pains and heartburn. Initial onset of obesity was after pregnancy. Initial presentation included inability to lose weight and abdominal obesity. Disease complications include sleep apnea and osteoarthritis. Current diet includes well balanced meals. less than once per week. Limitations to weight loss are sedentary lifestyle and depression. The patient is currently able to do activities of daily living without limitations, able to work without limitations and able to do housework without limitations. Past evaluation has included sleep study (uses bipap). Past treatment has included appetite suppressants, weight loss group and low carbohydrate diet. Surgical history: hysterectomy. Gastrointestinal History: Patient has heartburn. MBSQIP recognized comorbidities: Patient has sleep apnea and gastroesophageal reflux disease.  Has been doing well. Several insightful questions today. Takes lithium and depakote-will need to work with pharmacy post-op. Questions about diet- went to nutrition session on Monday. Activity and driving Reiterated our discussion about the surgery, the anticipated post-op recovery etc.   Problem List/Past Medical  MORBID OBESITY, UNSPECIFIED OBESITY TYPE (E66.01)   Past Surgical History  Foot Surgery  Right. Hysterectomy (not due to cancer) - Partial  Oral Surgery   Diagnostic Studies History  Colonoscopy  never Mammogram  within last year Pap  Smear  1-5 years ago  Allergies  ALOE   Medication History ALPRAZolam (0.5MG  Tablet, Oral daily) Active. Azelastine HCl (0.05% Solution, Ophthalmic daily) Active. ChlorproMAZINE HCl (25MG  Tablet, Oral daily) Active. Divalproex Sodium (500MG  Tablet DR, Oral daily) Active. EPINEPHrine (0.3MG /0.3ML Soln Auto-inj, Injection daily) Active. Esomeprazole Magnesium (40MG  Capsule DR, Oral daily) Active. FLUoxetine HCl (20MG  Capsule, Oral daily) Active. HydrOXYzine HCl (25MG  Tablet, Oral daily) Active. Levocetirizine Dihydrochloride (5MG  Tablet, Oral daily) Active. Lithium Carbonate ER (450MG  Tablet ER, Oral daily) Active. Montelukast Sodium (10MG  Tablet, Oral daily) Active. Ondansetron HCl (8MG  Tablet, Oral daily) Active. ValACYclovir HCl (1GM Tablet, Oral daily) Active. Zonisamide (25MG  Capsule, Oral daily) Active. Chantix (0.5MG  Tablet, Oral daily) Active. Medications Reconciled  Social History Alcohol use  Occasional alcohol use. Caffeine use  Carbonated beverages, Coffee. No drug use  Tobacco use  Former smoker.  Family History  Alcohol Abuse  Family Members In General. Arthritis  Mother. Bleeding disorder  Sister. Breast Cancer  Family Members In General. Depression  Family Members In General. Heart Disease  Family Members In General, Father. Hypertension  Father, Sister. Melanoma  Father. Migraine Headache  Father, Sister. Respiratory Condition  Family Members In General, Mother.  Pregnancy / Birth History Age at menarche  89 years. Gravida  2 Length (months) of breastfeeding  7-12 Maternal age  42-30 Para  2  Other Problems  Anxiety Disorder  Asthma  Depression  Gastroesophageal Reflux Disease  Migraine Headache  Sleep Apnea     Review of Systems  General Present- Fatigue and Weight Gain. Not Present- Appetite Loss, Chills, Fever, Night Sweats and Weight Loss. Skin Present- Dryness. Not Present- Change in Wart/Mole,  Hives, Jaundice, New Lesions, Non-Healing Wounds, Rash and Ulcer. HEENT Present- Seasonal Allergies and Wears glasses/contact lenses. Not Present- Earache, Hearing Loss, Hoarseness, Nose Bleed, Oral Ulcers, Ringing in the  Ears, Sinus Pain, Sore Throat, Visual Disturbances and Yellow Eyes. Respiratory Present- Snoring. Not Present- Bloody sputum, Chronic Cough, Difficulty Breathing and Wheezing. Breast Not Present- Breast Mass, Breast Pain, Nipple Discharge and Skin Changes. Cardiovascular Not Present- Chest Pain, Difficulty Breathing Lying Down, Leg Cramps, Palpitations, Rapid Heart Rate, Shortness of Breath and Swelling of Extremities. Gastrointestinal Present- Chronic diarrhea and Indigestion. Not Present- Abdominal Pain, Bloating, Bloody Stool, Change in Bowel Habits, Constipation, Difficulty Swallowing, Excessive gas, Gets full quickly at meals, Hemorrhoids, Nausea, Rectal Pain and Vomiting. Female Genitourinary Not Present- Frequency, Nocturia, Painful Urination, Pelvic Pain and Urgency. Musculoskeletal Not Present- Back Pain, Joint Pain, Joint Stiffness, Muscle Pain, Muscle Weakness and Swelling of Extremities. Neurological Present- Headaches. Not Present- Decreased Memory, Fainting, Numbness, Seizures, Tingling, Tremor, Trouble walking and Weakness. Psychiatric Present- Anxiety and Bipolar. Not Present- Change in Sleep Pattern, Depression, Fearful and Frequent crying. Endocrine Not Present- Cold Intolerance, Excessive Hunger, Hair Changes, Heat Intolerance, Hot flashes and New Diabetes. Hematology Not Present- Blood Thinners, Easy Bruising, Excessive bleeding, Gland problems, HIV and Persistent Infections.  Vitals Malachy Moan RMA; 01/08/2016 3:59 PM) 01/08/2016 3:58 PM Weight: 228 lb Height: 63in Body Surface Area: 2.04 m Body Mass Index: 40.39 kg/m  Temp.: 98.33F  Pulse: 93 (Regular)  BP: 140/90 (Sitting, Left Arm, Standard)       Physical Exam General Mental  Status-Alert. General Appearance-Cooperative. Orientation-Oriented X4. Build & Nutrition-Obese. Posture-Normal posture.  Integumentary Global Assessment Normal Exam - Head/Face: no rashes, ulcers, lesions or evidence of photo damage. No palpable nodules or masses and Neck: no visible lesions or palpable masses.  Head and Neck Head-normocephalic, atraumatic with no lesions or palpable masses. Face Global Assessment - atraumatic. Thyroid Gland Characteristics - normal size and consistency.  Eye Eyeball - Bilateral-Extraocular movements intact. Sclera/Conjunctiva - Bilateral-No scleral icterus, No Discharge.  ENMT Nose and Sinuses Nose - no deformities observed, no swelling present.  Chest and Lung Exam Palpation Normal exam - Non-tender. Auscultation Breath sounds - Normal.  Cardiovascular Auscultation Rhythm - Regular. Heart Sounds - S1 WNL and S2 WNL. Carotid arteries - No Carotid bruit.  Abdomen Inspection Normal Exam - No Visible peristalsis, No Abnormal pulsations and No Paradoxical movements. Palpation/Percussion Normal exam - Soft, Non Tender, No Rebound tenderness, No Rigidity (guarding), No hepatosplenomegaly and No Palpable abdominal masses.  Peripheral Vascular Upper Extremity Palpation - Pulses bilaterally normal. Lower Extremity Palpation - Edema - Bilateral - No edema.  Neurologic Neurologic evaluation reveals -normal sensation and normal coordination.  Neuropsychiatric Mental status exam performed with findings of-able to articulate well with normal speech/language, rate, volume and coherence and thought content normal with ability to perform basic computations and apply abstract reasoning.  Musculoskeletal Normal Exam - Bilateral-Upper Extremity Strength Normal and Lower Extremity Strength Normal.    Assessment & Plan  MORBID OBESITY, UNSPECIFIED OBESITY TYPE (E66.01) Story: She has done a fair amount of research and is  considered her options and is gone to our seminar. She is interested in the gastric bypass, I think she is a good candidate for this based on her history of reflux. We discussed that she'll need to be nicotine free for at least 3 months before surgery and she quit in November. She has several insightful questions all of which were answered. We went over the surgery, risks and benefits, and the usual recovery period.  Her UGi demonstrated no hiatal hernia or reflux.  She is on several mood stabilizers, will need to confer with pharmacy to change any extended release  meds if possible (Depakote 2000mg  po qD, Xanax 0.5mg  po qD prn anxiety, Lithium CR 450mg  po qD)

## 2016-05-09 ENCOUNTER — Encounter: Payer: BLUE CROSS/BLUE SHIELD | Attending: Surgery | Admitting: Skilled Nursing Facility1

## 2016-05-09 ENCOUNTER — Encounter: Payer: Self-pay | Admitting: Skilled Nursing Facility1

## 2016-05-09 DIAGNOSIS — Z713 Dietary counseling and surveillance: Secondary | ICD-10-CM | POA: Insufficient documentation

## 2016-05-09 DIAGNOSIS — E669 Obesity, unspecified: Secondary | ICD-10-CM

## 2016-05-09 NOTE — Progress Notes (Signed)
  Pre-Operative Nutrition Class:  Appt start time: 6837   End time:  1830.  Patient was seen on 05/09/2016 for Pre-Operative Bariatric Surgery Education [facility]   Surgery date: 05/16/2016 Surgery type: RYGB Start weight at [facility]: 227.3 Weight today: 237.9  [place]AN[place]  BODY COMP RESULTS  N/A   BMI (kg/m^2)    Fat Mass (lbs)    Fat Free Mass (lbs)    Total Body Water (lbs)    Samples given per MNT protocol. Patient educated on appropriate usage: OpurityMultivitamin Lot # [id number] Exp: 04/19  Bariatric Advantage Calcium Citrate Lot # [id number] Exp:  ProemierProtein  Lot #[id number] Exp: 21/dec/2018  The following the learning objectives were met by the patient during this course:  Identify Pre-Op Dietary Goals and will begin 2 weeks pre-operatively  Identify appropriate sources of fluids and proteins   State protein recommendations and appropriate sources pre and post-operatively  Identify Post-Operative Dietary Goals and will follow for 2 weeks post-operatively  Identify appropriate multivitamin and calcium sources  Describe the need for physical activity post-operatively and will follow MD recommendations  State when to call healthcare provider regarding medication questions or post-operative complications  Handouts given during class include:  Pre-Op Bariatric Surgery Diet Handout  Protein Shake Handout  Post-Op Bariatric Surgery Nutrition Handout  [facility] Program Information Flyer  Support Group Information Flyer  [facility] Bariatric Supplements Price List  [facility]: [facility] MD.

## 2016-05-10 ENCOUNTER — Encounter (HOSPITAL_COMMUNITY)
Admission: RE | Admit: 2016-05-10 | Discharge: 2016-05-10 | Disposition: A | Discharge status: Home / Self Care | Payer: BLUE CROSS/BLUE SHIELD | Source: Ambulatory Visit | Attending: Surgery | Admitting: Surgery

## 2016-05-10 ENCOUNTER — Encounter (HOSPITAL_COMMUNITY): Payer: Self-pay

## 2016-05-10 DIAGNOSIS — Z01812 Encounter for preprocedural laboratory examination: Secondary | ICD-10-CM | POA: Diagnosis not present

## 2016-05-10 DIAGNOSIS — Z6841 Body Mass Index (BMI) 40.0 and over, adult: Secondary | ICD-10-CM | POA: Insufficient documentation

## 2016-05-10 HISTORY — DX: Other specified postprocedural states: Z98.890

## 2016-05-10 HISTORY — DX: Nausea with vomiting, unspecified: R11.2

## 2016-05-10 HISTORY — DX: Pneumonia, unspecified organism: J18.9

## 2016-05-10 LAB — CBC WITH DIFFERENTIAL/PLATELET
BASOS ABS: 0 10*3/uL (ref 0.0–0.1)
Basophils Relative: 0 %
EOS ABS: 0.1 10*3/uL (ref 0.0–0.7)
EOS PCT: 1 %
HCT: 34.5 % — ABNORMAL LOW (ref 36.0–46.0)
Hemoglobin: 11.1 g/dL — ABNORMAL LOW (ref 12.0–15.0)
LYMPHS ABS: 2 10*3/uL (ref 0.7–4.0)
LYMPHS PCT: 37 %
MCH: 29.9 pg (ref 26.0–34.0)
MCHC: 32.2 g/dL (ref 30.0–36.0)
MCV: 93 fL (ref 78.0–100.0)
MONO ABS: 0.3 10*3/uL (ref 0.1–1.0)
Monocytes Relative: 5 %
Neutro Abs: 3 10*3/uL (ref 1.7–7.7)
Neutrophils Relative %: 57 %
PLATELETS: 244 10*3/uL (ref 150–400)
RBC: 3.71 MIL/uL — AB (ref 3.87–5.11)
RDW: 12.9 % (ref 11.5–15.5)
WBC: 5.3 10*3/uL (ref 4.0–10.5)

## 2016-05-10 LAB — COMPREHENSIVE METABOLIC PANEL
ALT: 8 U/L — ABNORMAL LOW (ref 14–54)
AST: 13 U/L — ABNORMAL LOW (ref 15–41)
Albumin: 4.1 g/dL (ref 3.5–5.0)
Alkaline Phosphatase: 46 U/L (ref 38–126)
Anion gap: 8 (ref 5–15)
BUN: 15 mg/dL (ref 6–20)
CHLORIDE: 104 mmol/L (ref 101–111)
CO2: 25 mmol/L (ref 22–32)
Calcium: 9.3 mg/dL (ref 8.9–10.3)
Creatinine, Ser: 0.68 mg/dL (ref 0.44–1.00)
Glucose, Bld: 101 mg/dL — ABNORMAL HIGH (ref 65–99)
POTASSIUM: 4.2 mmol/L (ref 3.5–5.1)
SODIUM: 137 mmol/L (ref 135–145)
Total Bilirubin: 0.2 mg/dL — ABNORMAL LOW (ref 0.3–1.2)
Total Protein: 7.1 g/dL (ref 6.5–8.1)

## 2016-05-10 NOTE — Progress Notes (Signed)
EKG-01/22/16-epic

## 2016-05-16 ENCOUNTER — Encounter (HOSPITAL_COMMUNITY)
Admission: RE | Disposition: A | Discharge status: Home / Self Care | Payer: Self-pay | Source: Ambulatory Visit | Attending: Surgery

## 2016-05-16 ENCOUNTER — Inpatient Hospital Stay (HOSPITAL_COMMUNITY)
Admission: RE | Admit: 2016-05-16 | Discharge: 2016-05-18 | DRG: 621 | Disposition: A | Discharge status: Home / Self Care | Payer: BLUE CROSS/BLUE SHIELD | Source: Ambulatory Visit | Attending: Surgery | Admitting: Surgery

## 2016-05-16 ENCOUNTER — Inpatient Hospital Stay (HOSPITAL_COMMUNITY): Payer: BLUE CROSS/BLUE SHIELD | Admitting: Anesthesiology

## 2016-05-16 ENCOUNTER — Encounter (HOSPITAL_COMMUNITY): Payer: Self-pay | Admitting: *Deleted

## 2016-05-16 DIAGNOSIS — Z6841 Body Mass Index (BMI) 40.0 and over, adult: Secondary | ICD-10-CM | POA: Diagnosis not present

## 2016-05-16 DIAGNOSIS — Z79899 Other long term (current) drug therapy: Secondary | ICD-10-CM

## 2016-05-16 DIAGNOSIS — G4730 Sleep apnea, unspecified: Secondary | ICD-10-CM | POA: Diagnosis not present

## 2016-05-16 DIAGNOSIS — F418 Other specified anxiety disorders: Secondary | ICD-10-CM | POA: Diagnosis not present

## 2016-05-16 DIAGNOSIS — K219 Gastro-esophageal reflux disease without esophagitis: Secondary | ICD-10-CM | POA: Diagnosis present

## 2016-05-16 DIAGNOSIS — Z87891 Personal history of nicotine dependence: Secondary | ICD-10-CM

## 2016-05-16 DIAGNOSIS — F319 Bipolar disorder, unspecified: Secondary | ICD-10-CM | POA: Diagnosis not present

## 2016-05-16 DIAGNOSIS — Z6839 Body mass index (BMI) 39.0-39.9, adult: Secondary | ICD-10-CM | POA: Diagnosis not present

## 2016-05-16 DIAGNOSIS — G4733 Obstructive sleep apnea (adult) (pediatric): Secondary | ICD-10-CM | POA: Diagnosis not present

## 2016-05-16 DIAGNOSIS — F419 Anxiety disorder, unspecified: Secondary | ICD-10-CM | POA: Diagnosis present

## 2016-05-16 DIAGNOSIS — Z90710 Acquired absence of both cervix and uterus: Secondary | ICD-10-CM

## 2016-05-16 DIAGNOSIS — R609 Edema, unspecified: Secondary | ICD-10-CM | POA: Diagnosis not present

## 2016-05-16 DIAGNOSIS — M79609 Pain in unspecified limb: Secondary | ICD-10-CM | POA: Diagnosis not present

## 2016-05-16 HISTORY — PX: GASTRIC ROUX-EN-Y: SHX5262

## 2016-05-16 LAB — HEMOGLOBIN AND HEMATOCRIT, BLOOD
HEMATOCRIT: 38.1 % (ref 36.0–46.0)
Hemoglobin: 12.3 g/dL (ref 12.0–15.0)

## 2016-05-16 LAB — GLUCOSE, CAPILLARY: GLUCOSE-CAPILLARY: 122 mg/dL — AB (ref 65–99)

## 2016-05-16 SURGERY — LAPAROSCOPIC ROUX-EN-Y GASTRIC BYPASS WITH UPPER ENDOSCOPY
Anesthesia: General

## 2016-05-16 MED ORDER — SODIUM CHLORIDE 0.9 % IV SOLN
INTRAVENOUS | Status: DC
  Administered 2016-05-16: 1000 mL via INTRAVENOUS
  Administered 2016-05-16 – 2016-05-18 (×3): via INTRAVENOUS

## 2016-05-16 MED ORDER — ONDANSETRON HCL 4 MG/2ML IJ SOLN
INTRAMUSCULAR | Status: AC
  Filled 2016-05-16: qty 2

## 2016-05-16 MED ORDER — GABAPENTIN 300 MG PO CAPS
ORAL_CAPSULE | ORAL | Status: AC
  Filled 2016-05-16: qty 1

## 2016-05-16 MED ORDER — SUGAMMADEX SODIUM 200 MG/2ML IV SOLN
INTRAVENOUS | Status: DC | PRN
  Administered 2016-05-16: 200 mg via INTRAVENOUS

## 2016-05-16 MED ORDER — HYDROXYZINE HCL 25 MG PO TABS
25.0000 mg | ORAL_TABLET | Freq: Every day | ORAL | Status: DC
  Administered 2016-05-16: 25 mg via ORAL
  Filled 2016-05-16: qty 1

## 2016-05-16 MED ORDER — ALBUTEROL SULFATE (2.5 MG/3ML) 0.083% IN NEBU: 2.5000 mg | INHALATION_SOLUTION | Freq: Four times a day (QID) | RESPIRATORY_TRACT | Status: DC | PRN

## 2016-05-16 MED ORDER — FENTANYL CITRATE (PF) 100 MCG/2ML IJ SOLN
INTRAMUSCULAR | Status: AC
  Filled 2016-05-16: qty 2

## 2016-05-16 MED ORDER — DEXAMETHASONE SODIUM PHOSPHATE 10 MG/ML IJ SOLN
INTRAMUSCULAR | Status: DC | PRN
  Administered 2016-05-16: 10 mg via INTRAVENOUS

## 2016-05-16 MED ORDER — SODIUM CHLORIDE 0.9 % IJ SOLN
INTRAMUSCULAR | Status: AC
  Filled 2016-05-16: qty 50

## 2016-05-16 MED ORDER — 0.9 % SODIUM CHLORIDE (POUR BTL) OPTIME
TOPICAL | Status: DC | PRN
  Administered 2016-05-16: 1000 mL

## 2016-05-16 MED ORDER — FENTANYL CITRATE (PF) 100 MCG/2ML IJ SOLN
25.0000 ug | INTRAMUSCULAR | Status: DC | PRN
  Administered 2016-05-16: 50 ug via INTRAVENOUS

## 2016-05-16 MED ORDER — LACTATED RINGERS IV SOLN
INTRAVENOUS | Status: DC | PRN
  Administered 2016-05-16 (×2): via INTRAVENOUS

## 2016-05-16 MED ORDER — ACETAMINOPHEN 160 MG/5ML PO SOLN: 650.0000 mg | Freq: Four times a day (QID) | ORAL | Status: DC | PRN

## 2016-05-16 MED ORDER — LABETALOL HCL 5 MG/ML IV SOLN
5.0000 mg | INTRAVENOUS | Status: DC | PRN
  Filled 2016-05-16: qty 4

## 2016-05-16 MED ORDER — FENTANYL CITRATE (PF) 250 MCG/5ML IJ SOLN
INTRAMUSCULAR | Status: AC
  Filled 2016-05-16: qty 5

## 2016-05-16 MED ORDER — LITHIUM CARBONATE 150 MG PO CAPS
150.0000 mg | ORAL_CAPSULE | Freq: Three times a day (TID) | ORAL | Status: DC
  Administered 2016-05-17 – 2016-05-18 (×5): 150 mg via ORAL
  Filled 2016-05-16 (×8): qty 1

## 2016-05-16 MED ORDER — OXYCODONE HCL 5 MG/5ML PO SOLN
5.0000 mg | ORAL | Status: DC | PRN
  Administered 2016-05-18: 5 mg via ORAL
  Filled 2016-05-16: qty 5

## 2016-05-16 MED ORDER — SUGAMMADEX SODIUM 200 MG/2ML IV SOLN
INTRAVENOUS | Status: AC
  Filled 2016-05-16: qty 2

## 2016-05-16 MED ORDER — APREPITANT 40 MG PO CAPS
40.0000 mg | ORAL_CAPSULE | ORAL | Status: AC
  Administered 2016-05-16: 40 mg via ORAL
  Filled 2016-05-16: qty 1

## 2016-05-16 MED ORDER — PREMIER PROTEIN SHAKE
2.0000 [oz_av] | ORAL | Status: DC
  Administered 2016-05-17 – 2016-05-18 (×8): 2 [oz_av] via ORAL
  Filled 2016-05-16 (×18): qty 325.31

## 2016-05-16 MED ORDER — DIVALPROEX SODIUM 500 MG PO DR TAB: 2000.0000 mg | DELAYED_RELEASE_TABLET | Freq: Every day | ORAL | Status: DC

## 2016-05-16 MED ORDER — ACETAMINOPHEN 325 MG PO TABS: 650.0000 mg | ORAL_TABLET | ORAL | Status: DC | PRN

## 2016-05-16 MED ORDER — HYDROMORPHONE HCL 1 MG/ML IJ SOLN
0.5000 mg | INTRAMUSCULAR | Status: DC | PRN
  Administered 2016-05-17 (×2): 0.5 mg via INTRAVENOUS
  Filled 2016-05-16 (×2): qty 0.5

## 2016-05-16 MED ORDER — EVICEL 5 ML EX KIT
PACK | Freq: Once | CUTANEOUS | Status: AC
  Administered 2016-05-16: 1
  Filled 2016-05-16: qty 1

## 2016-05-16 MED ORDER — BUPIVACAINE LIPOSOME 1.3 % IJ SUSP
INTRAMUSCULAR | Status: DC | PRN
  Administered 2016-05-16: 20 mL

## 2016-05-16 MED ORDER — PROPOFOL 10 MG/ML IV BOLUS
INTRAVENOUS | Status: DC | PRN
  Administered 2016-05-16: 200 mg via INTRAVENOUS

## 2016-05-16 MED ORDER — PROCHLORPERAZINE EDISYLATE 5 MG/ML IJ SOLN
5.0000 mg | Freq: Once | INTRAMUSCULAR | Status: AC
  Administered 2016-05-16: 5 mg via INTRAVENOUS

## 2016-05-16 MED ORDER — HYDRALAZINE HCL 20 MG/ML IJ SOLN: 5.0000 mg | INTRAMUSCULAR | Status: DC | PRN

## 2016-05-16 MED ORDER — PROCHLORPERAZINE EDISYLATE 5 MG/ML IJ SOLN
INTRAMUSCULAR | Status: AC
  Filled 2016-05-16: qty 2

## 2016-05-16 MED ORDER — SCOPOLAMINE 1 MG/3DAYS TD PT72
1.0000 | MEDICATED_PATCH | TRANSDERMAL | Status: DC
Start: 2016-05-16 — End: 2016-05-18
  Administered 2016-05-16: 1.5 mg via TRANSDERMAL
  Filled 2016-05-16: qty 1

## 2016-05-16 MED ORDER — CHLORHEXIDINE GLUCONATE 4 % EX LIQD: 60.0000 mL | Freq: Once | CUTANEOUS | Status: DC

## 2016-05-16 MED ORDER — ACETAMINOPHEN 500 MG PO TABS
ORAL_TABLET | ORAL | Status: AC
  Filled 2016-05-16: qty 2

## 2016-05-16 MED ORDER — SODIUM CHLORIDE 0.9 % IJ SOLN
INTRAMUSCULAR | Status: DC | PRN
  Administered 2016-05-16: 50 mL

## 2016-05-16 MED ORDER — PANTOPRAZOLE SODIUM 40 MG IV SOLR
40.0000 mg | Freq: Every day | INTRAVENOUS | Status: DC
  Administered 2016-05-16 – 2016-05-17 (×2): 40 mg via INTRAVENOUS
  Filled 2016-05-16 (×2): qty 40

## 2016-05-16 MED ORDER — BUPIVACAINE LIPOSOME 1.3 % IJ SUSP
20.0000 mL | Freq: Once | INTRAMUSCULAR | Status: DC
  Filled 2016-05-16: qty 20

## 2016-05-16 MED ORDER — DIVALPROEX SODIUM 125 MG PO CSDR
500.0000 mg | DELAYED_RELEASE_CAPSULE | Freq: Two times a day (BID) | ORAL | Status: DC
  Administered 2016-05-16 – 2016-05-17 (×2): 500 mg via ORAL
  Filled 2016-05-16 (×3): qty 4

## 2016-05-16 MED ORDER — ACETAMINOPHEN 160 MG/5ML PO SOLN
325.0000 mg | ORAL | Status: DC | PRN
  Administered 2016-05-18: 650 mg via ORAL
  Filled 2016-05-16: qty 20.3

## 2016-05-16 MED ORDER — MIDAZOLAM HCL 5 MG/5ML IJ SOLN
INTRAMUSCULAR | Status: DC | PRN
  Administered 2016-05-16: 2 mg via INTRAVENOUS

## 2016-05-16 MED ORDER — ONDANSETRON HCL 4 MG/2ML IJ SOLN
4.0000 mg | Freq: Once | INTRAMUSCULAR | Status: AC | PRN
  Administered 2016-05-16: 4 mg via INTRAVENOUS

## 2016-05-16 MED ORDER — GABAPENTIN 300 MG PO CAPS
300.0000 mg | ORAL_CAPSULE | Freq: Once | ORAL | Status: AC
  Administered 2016-05-16: 300 mg via ORAL

## 2016-05-16 MED ORDER — ZONISAMIDE 25 MG PO CAPS
75.0000 mg | ORAL_CAPSULE | Freq: Every day | ORAL | Status: DC
  Administered 2016-05-16: 75 mg via ORAL
  Filled 2016-05-16 (×2): qty 3

## 2016-05-16 MED ORDER — FENTANYL CITRATE (PF) 100 MCG/2ML IJ SOLN
INTRAMUSCULAR | Status: DC | PRN
  Administered 2016-05-16: 50 ug via INTRAVENOUS
  Administered 2016-05-16 (×2): 100 ug via INTRAVENOUS
  Administered 2016-05-16: 50 ug via INTRAVENOUS

## 2016-05-16 MED ORDER — ROCURONIUM BROMIDE 100 MG/10ML IV SOLN
INTRAVENOUS | Status: DC | PRN
  Administered 2016-05-16: 10 mg via INTRAVENOUS
  Administered 2016-05-16: 20 mg via INTRAVENOUS
  Administered 2016-05-16: 50 mg via INTRAVENOUS
  Administered 2016-05-16 (×2): 10 mg via INTRAVENOUS

## 2016-05-16 MED ORDER — ENOXAPARIN SODIUM 30 MG/0.3ML ~~LOC~~ SOLN
30.0000 mg | Freq: Two times a day (BID) | SUBCUTANEOUS | Status: DC
  Administered 2016-05-17 – 2016-05-18 (×3): 30 mg via SUBCUTANEOUS
  Filled 2016-05-16 (×3): qty 0.3

## 2016-05-16 MED ORDER — MIDAZOLAM HCL 2 MG/2ML IJ SOLN
INTRAMUSCULAR | Status: AC
  Filled 2016-05-16: qty 2

## 2016-05-16 MED ORDER — ACETAMINOPHEN 500 MG PO TABS
1000.0000 mg | ORAL_TABLET | Freq: Once | ORAL | Status: AC
  Administered 2016-05-16: 1000 mg via ORAL

## 2016-05-16 MED ORDER — ROCURONIUM BROMIDE 50 MG/5ML IV SOSY
PREFILLED_SYRINGE | INTRAVENOUS | Status: AC
  Filled 2016-05-16: qty 5

## 2016-05-16 MED ORDER — ONDANSETRON HCL 4 MG/2ML IJ SOLN
INTRAMUSCULAR | Status: DC | PRN
Start: 2016-05-16 — End: 2016-05-16
  Administered 2016-05-16: 4 mg via INTRAVENOUS

## 2016-05-16 MED ORDER — ENOXAPARIN SODIUM 40 MG/0.4ML ~~LOC~~ SOLN
40.0000 mg | SUBCUTANEOUS | Status: AC
  Administered 2016-05-16: 40 mg via SUBCUTANEOUS
  Filled 2016-05-16: qty 0.4

## 2016-05-16 MED ORDER — CEFOTETAN DISODIUM-DEXTROSE 2-2.08 GM-% IV SOLR
INTRAVENOUS | Status: AC
  Filled 2016-05-16: qty 50

## 2016-05-16 MED ORDER — ONDANSETRON HCL 4 MG/2ML IJ SOLN
4.0000 mg | INTRAMUSCULAR | Status: DC | PRN
  Administered 2016-05-17 – 2016-05-18 (×2): 4 mg via INTRAVENOUS
  Filled 2016-05-16 (×3): qty 2

## 2016-05-16 MED ORDER — LITHIUM CARBONATE ER 450 MG PO TBCR: 450.0000 mg | EXTENDED_RELEASE_TABLET | Freq: Every day | ORAL | Status: DC

## 2016-05-16 MED ORDER — INSULIN ASPART 100 UNIT/ML ~~LOC~~ SOLN
0.0000 [IU] | SUBCUTANEOUS | Status: DC
  Administered 2016-05-16: 2 [IU] via SUBCUTANEOUS

## 2016-05-16 MED ORDER — PROPOFOL 10 MG/ML IV BOLUS
INTRAVENOUS | Status: AC
  Filled 2016-05-16: qty 40

## 2016-05-16 MED ORDER — FENTANYL CITRATE (PF) 100 MCG/2ML IJ SOLN: 25.0000 ug | INTRAMUSCULAR | Status: DC | PRN

## 2016-05-16 MED ORDER — LACTATED RINGERS IR SOLN
Status: DC | PRN
  Administered 2016-05-16: 1000 mL

## 2016-05-16 MED ORDER — ALPRAZOLAM 0.5 MG PO TABS: 0.5000 mg | ORAL_TABLET | Freq: Every evening | ORAL | Status: DC | PRN

## 2016-05-16 MED ORDER — CEFOTETAN DISODIUM-DEXTROSE 2-2.08 GM-% IV SOLR
2.0000 g | INTRAVENOUS | Status: AC
  Administered 2016-05-16: 30 g via INTRAVENOUS

## 2016-05-16 MED ORDER — LIDOCAINE HCL (CARDIAC) 20 MG/ML IV SOLN
INTRAVENOUS | Status: DC | PRN
  Administered 2016-05-16: 60 mg via INTRAVENOUS

## 2016-05-16 SURGICAL SUPPLY — 75 items
ADH SKN CLS APL DERMABOND .7 (GAUZE/BANDAGES/DRESSINGS) ×1
APPLICATOR COTTON TIP 6IN STRL (MISCELLANEOUS) ×6 IMPLANT
APPLIER CLIP ROT 13.4 12 LRG (CLIP)
APR CLP LRG 13.4X12 ROT 20 MLT (CLIP)
BLADE SURG SZ11 CARB STEEL (BLADE) ×3 IMPLANT
CABLE HIGH FREQUENCY MONO STRZ (ELECTRODE) ×3 IMPLANT
CHLORAPREP W/TINT 26ML (MISCELLANEOUS) ×6 IMPLANT
CLIP APPLIE ROT 13.4 12 LRG (CLIP) IMPLANT
CLIP SUT LAPRA TY ABSORB (SUTURE) ×6 IMPLANT
COVER SURGICAL LIGHT HANDLE (MISCELLANEOUS) ×3 IMPLANT
DERMABOND ADVANCED (GAUZE/BANDAGES/DRESSINGS) ×2
DERMABOND ADVANCED .7 DNX12 (GAUZE/BANDAGES/DRESSINGS) ×1 IMPLANT
DEVICE SUT QUICK LOAD TK 5 (STAPLE) IMPLANT
DEVICE SUT TI-KNOT TK 5X26 (MISCELLANEOUS) IMPLANT
DEVICE SUTURE ENDOST 10MM (ENDOMECHANICALS) ×3 IMPLANT
DEVICE TI KNOT TK5 (MISCELLANEOUS)
DRAIN PENROSE 18X1/4 LTX STRL (WOUND CARE) ×3 IMPLANT
ELECT REM PT RETURN 15FT ADLT (MISCELLANEOUS) ×3 IMPLANT
GAUZE SPONGE 4X4 12PLY STRL (GAUZE/BANDAGES/DRESSINGS) IMPLANT
GAUZE SPONGE 4X4 16PLY XRAY LF (GAUZE/BANDAGES/DRESSINGS) ×3 IMPLANT
GLOVE BIO SURGEON STRL SZ 6 (GLOVE) ×3 IMPLANT
GLOVE INDICATOR 6.5 STRL GRN (GLOVE) ×3 IMPLANT
GOWN STRL REUS W/TWL LRG LVL3 (GOWN DISPOSABLE) ×3 IMPLANT
GOWN STRL REUS W/TWL XL LVL3 (GOWN DISPOSABLE) ×9 IMPLANT
HOVERMATT SINGLE USE (MISCELLANEOUS) ×3 IMPLANT
KIT BASIN OR (CUSTOM PROCEDURE TRAY) ×3 IMPLANT
KIT GASTRIC LAVAGE 34FR ADT (SET/KITS/TRAYS/PACK) ×3 IMPLANT
LUBRICANT JELLY K Y 4OZ (MISCELLANEOUS) ×3 IMPLANT
MARKER SKIN DUAL TIP RULER LAB (MISCELLANEOUS) ×3 IMPLANT
NDL SPNL 22GX3.5 QUINCKE BK (NEEDLE) ×1 IMPLANT
NEEDLE SPNL 22GX3.5 QUINCKE BK (NEEDLE) ×3 IMPLANT
PACK CARDIOVASCULAR III (CUSTOM PROCEDURE TRAY) ×3 IMPLANT
QUICK LOAD TK 5 (STAPLE)
RELOAD ENDO STITCH 2.0 (ENDOMECHANICALS) ×24
RELOAD STAPLE 60 2.6 WHT THN (STAPLE) IMPLANT
RELOAD STAPLE 60 3.6 BLU REG (STAPLE) IMPLANT
RELOAD STAPLE 60 3.8 GOLD REG (STAPLE) IMPLANT
RELOAD STAPLER BLUE 60MM (STAPLE) ×5 IMPLANT
RELOAD STAPLER GOLD 60MM (STAPLE) ×1 IMPLANT
RELOAD STAPLER WHITE 60MM (STAPLE) IMPLANT
RELOAD SUT SNGL STCH ABSRB 2-0 (ENDOMECHANICALS) ×4 IMPLANT
RELOAD SUT SNGL STCH BLK 2-0 (ENDOMECHANICALS) ×4 IMPLANT
SCISSORS LAP 5X45 EPIX DISP (ENDOMECHANICALS) ×3 IMPLANT
SET IRRIG TUBING LAPAROSCOPIC (IRRIGATION / IRRIGATOR) ×2 IMPLANT
SHEARS HARMONIC ACE PLUS 45CM (MISCELLANEOUS) ×3 IMPLANT
SLEEVE ADV FIXATION 12X100MM (TROCAR) ×6 IMPLANT
SLEEVE XCEL OPT CAN 5 100 (ENDOMECHANICALS) IMPLANT
SOLUTION ANTI FOG 6CC (MISCELLANEOUS) ×3 IMPLANT
STAPLER ECHELON BIOABSB 60 FLE (MISCELLANEOUS) IMPLANT
STAPLER ECHELON LONG 60 440 (INSTRUMENTS) ×3 IMPLANT
STAPLER RELOAD BLUE 60MM (STAPLE) ×15
STAPLER RELOAD GOLD 60MM (STAPLE) ×3
STAPLER RELOAD WHITE 60MM (STAPLE)
SUT MNCRL AB 4-0 PS2 18 (SUTURE) ×5 IMPLANT
SUT RELOAD ENDO STITCH 2 48X1 (ENDOMECHANICALS) ×4
SUT RELOAD ENDO STITCH 2.0 (ENDOMECHANICALS) ×4
SUT SURGIDAC NAB ES-9 0 48 120 (SUTURE) IMPLANT
SUT VIC AB 2-0 SH 27 (SUTURE) ×3
SUT VIC AB 2-0 SH 27X BRD (SUTURE) ×1 IMPLANT
SUTURE RELOAD END STTCH 2 48X1 (ENDOMECHANICALS) ×4 IMPLANT
SUTURE RELOAD ENDO STITCH 2.0 (ENDOMECHANICALS) ×4 IMPLANT
SYR 10ML ECCENTRIC (SYRINGE) ×3 IMPLANT
SYR 20CC LL (SYRINGE) ×6 IMPLANT
TIP RIGID 35CM EVICEL (HEMOSTASIS) IMPLANT
TOWEL OR 17X26 10 PK STRL BLUE (TOWEL DISPOSABLE) ×3 IMPLANT
TOWEL OR NON WOVEN STRL DISP B (DISPOSABLE) ×3 IMPLANT
TRAY FOLEY W/METER SILVER 14FR (SET/KITS/TRAYS/PACK) ×2 IMPLANT
TROCAR ADV FIXATION 12X100MM (TROCAR) ×3 IMPLANT
TROCAR ADV FIXATION 5X100MM (TROCAR) ×3 IMPLANT
TROCAR BLADELESS OPT 5 100 (ENDOMECHANICALS) ×3 IMPLANT
TROCAR XCEL 12X100 BLDLESS (ENDOMECHANICALS) ×3 IMPLANT
TUBING CONNECTING 10 (TUBING) IMPLANT
TUBING CONNECTING 10' (TUBING)
TUBING ENDO SMARTCAP PENTAX (MISCELLANEOUS) ×3 IMPLANT
TUBING INSUF HEATED (TUBING) ×3 IMPLANT

## 2016-05-16 NOTE — Transfer of Care (Signed)
Immediate Anesthesia Transfer of Care Note  Patient: Margaret Lopez  Procedure(s) Performed: Procedure(s): LAPAROSCOPIC ROUX-EN-Y GASTRIC BYPASS WITH UPPER ENDOSCOPY (N/A)  Patient Location: PACU  Anesthesia Type:General  Level of Consciousness: awake, alert  and oriented  Airway & Oxygen Therapy: Patient Spontanous Breathing and Patient connected to face mask oxygen  Post-op Assessment: Report given to RN and Post -op Vital signs reviewed and stable  Post vital signs: Reviewed and stable  Last Vitals:  Vitals:   05/16/16 0530  BP: 139/72  Pulse: 72  Resp: 16  Temp: 36.7 C    Last Pain:  Vitals:   05/16/16 0530  TempSrc: Oral      Patients Stated Pain Goal: 3 (28/20/60 1561)  Complications: No apparent anesthesia complications

## 2016-05-16 NOTE — Anesthesia Postprocedure Evaluation (Signed)
Anesthesia Post Note  Patient: Margaret Lopez  Procedure(s) Performed: Procedure(s) (LRB): LAPAROSCOPIC ROUX-EN-Y GASTRIC BYPASS WITH UPPER ENDOSCOPY (N/A)  Patient location during evaluation: PACU Anesthesia Type: General Level of consciousness: awake and alert Pain management: pain level controlled Vital Signs Assessment: post-procedure vital signs reviewed and stable Respiratory status: spontaneous breathing, nonlabored ventilation, respiratory function stable and patient connected to nasal cannula oxygen Cardiovascular status: blood pressure returned to baseline and stable Postop Assessment: no signs of nausea or vomiting Anesthetic complications: no       Last Vitals:  Vitals:   05/16/16 1130 05/16/16 1150  BP: (!) 146/91 (!) 159/77  Pulse: 73 70  Resp: 14 20  Temp: 36.5 C 36.6 C    Last Pain:  Vitals:   05/16/16 1130  TempSrc:   PainSc: Asleep                 Catalina Gravel

## 2016-05-16 NOTE — Progress Notes (Signed)
Pharmacy consult: Patient on several mood stabilizers, extended release/ pills. s/p gastric bypass. Anything that can be changed to liquid or capsule?  Rec:  Depakote 2000 DR qday>>depakote sprinkles 500 mg PO bid Eskalith 450 CR qhs>> lithium 150 mg cap tid  Eudelia Bunch, Pharm.D. [phone] 05/16/2016 12:42 PM

## 2016-05-16 NOTE — Op Note (Signed)
Margaret Lopez [id number] 09/19/1970. 05/16/2016  Preoperative diagnosis:  1. Morbid obesity (BMI 39)  2. Sleep apnea on nightly bipap 3. Gastroesophageal reflux disease 4. Depression, bipolar disorder, anxiety, migraines  Postoperative  diagnosis:  1. same  Surgical procedure: Laparoscopic Roux-en-Y gastric bypass (ante-colic, ante-gastric); upper endoscopy  Surgeon: Clovis Riley MD  Asst.: Alphonsa Overall, MD  Anesthesia: General plus laparoscopic-assisted TAPS block using exparel and marcaine  Complications: None   EBL: Minimal   Drains: None   Disposition: PACU in good condition   Indications for procedure: 46yo woman with morbid obesity who has been unsuccessful at sustained weight loss. The patient's comorbidities are listed above. We discussed the risk and benefits of surgery including but not limited to anesthesia risk, bleeding, infection, blood clot formation, anastomotic leak, anastomotic stricture, ulcer formation, death, respiratory complications, intestinal blockage, internal hernia, gallstone formation, vitamin and nutritional deficiencies, injury to surrounding structures, failure to lose weight and mood changes.   Description of procedure: Patient is brought to the operating room and general anesthesia induced. The patient had received preoperative broad-spectrum IV antibiotics and subcutaneous lovenox. The abdomen was widely sterilely prepped with Chloraprep and draped. Patient timeout was performed and correct patient and procedure confirmed. Access was obtained with a 12 mm Optiview trocar in the left upper quadrant and pneumoperitoneum established without difficulty. Under direct vision 12 mm trocars were placed laterally in the right upper quadrant, right upper quadrant midclavicular line, and to the left and above the umbilicus for the camera port. A 5 mm trocar was placed laterally in the left upper quadrant. Under laparoscopic visualization, exparel mixed with  marcaine was infiltrated in the abdominal wall  Pre-peritoneal and transversus space bilaterally. The omentum was brought into the upper abdomen and the transverse mesocolon elevated and the ligament of Treitz clearly identified. A 40 cm biliopancreatic limb was then carefully measured from the ligament of Treitz. The small intestine was divided at this point with a single firing of the white load linear stapler. The mesentery was divided a short distance with harmonic scalpel. A Penrose drain was sutured to the end of the Roux-en-Y limb for later identification. A 100 cm Roux-en-Y limb was then carefully measured. At this point a side-to-side anastomosis was created between the Roux limb and the end of the biliopancreatic limb. This was accomplished with a single firing of the 60 mm white load linear stapler. The common enterotomy was closed with a running 2-0 Vicryl begun at either end of the enterotomy and tied centrally. Tisseel tissue sealant was placed over the anastomosis. The mesenteric defect was then closed with running 2-0 silk. The roux limb easily reached up to the stomach, the omentum was not divided. The patient was then placed in steep reversed Trendelenburg. Through a 5 mm subxiphoid site the [facility] retractor was placed and the left lobe of the liver elevated with excellent exposure of the upper stomach and hiatus. The angle of Hiss was then mobilized with the harmonic scalpel. A 4 cm gastric pouch was then carefully measured along the lesser curve of the stomach. Dissection was carried along the lesser curve at this point with the Harmonic scalpel working carefully back toward the lesser sac at right angles to the lesser curve. The free lesser sac was then entered. After being sure all tubes were removed from the stomach an initial firing of the gold load 60 mm linear stapler was fired at right angles across the lesser curve for about 4 cm. The gastric  pouch was further mobilized posteriorly and  then the pouch was completed with further firings of the 60 mm blue load linear stapler up through the previously dissected angle of His. It was ensured that the pouch was completely mobilized away from the gastric remnant. This created a nice tubular 4-5 cm gastric pouch.  The Roux limb was then brought up in an antecolic fashion with the candycane facing to the patient's left without undue tension. The gastrojejunostomy was created with an initial posterior row of 2-0 Vicryl between the Roux limb and the staple line of the gastric pouch. Enterotomies were then made in the gastric pouch and the Roux limb with the harmonic scalpel and at approximately 3 cm anastomosis was created with a single firing of the blue load linear stapler. The staple line was inspected and was intact without bleeding. The common enterotomy was then closed with running 2-0 Vicryl begun at either end and tied centrally. The Ewall tube was then easily passed through the anastomosis and an outer anterior layer of running 2-0 Vicryl was placed. The Ewald tube was removed. With the outlet of the gastrojejunostomy clamped and under saline irrigation the assistant performed upper endoscopy and with the gastric pouch tensely distended with air-there was no evidence of leak on this test. The pouch was desufflated. The Terance Hart defect was closed with running 2-0 silk. The abdomen was inspected for any evidence of bleeding or bowel injury and everything looked fine. Tisseel was sprayed over the staple line of the gastric remnant as well as the anterior surface of the GJ. The Nathanson retractor was removed under direct. All CO2 was evacuated and trochars removed. Skin incisions were closed with 4-0 monocryl in a subcuticular fashion followed by Dermabond. Sponge needle and instrument counts were correct. The patient was taken to the PACU in good condition.    Clovis Riley MD General, Bariatric, & Minimally Invasive Surgery [facility]  Surgery, [place]

## 2016-05-16 NOTE — Anesthesia Preprocedure Evaluation (Addendum)
Anesthesia Evaluation  Patient identified by MRN, date of birth, ID band Patient awake    Reviewed: Allergy & Precautions, NPO status , Patient's Chart, lab work & pertinent test results  History of Anesthesia Complications (+) PONV and history of anesthetic complications  Airway Mallampati: III  TM Distance: >3 FB Neck ROM: Full    Dental  (+) Teeth Intact, Dental Advisory Given   Pulmonary sleep apnea and Continuous Positive Airway Pressure Ventilation , former smoker,    Pulmonary exam normal breath sounds clear to auscultation       Cardiovascular Exercise Tolerance: Good negative cardio ROS Normal cardiovascular exam Rhythm:Regular Rate:Normal     Neuro/Psych  Headaches, PSYCHIATRIC DISORDERS Anxiety Depression Bipolar Disorder    GI/Hepatic Neg liver ROS, GERD  Medicated,  Endo/Other  Morbid obesity  Renal/GU negative Renal ROS     Musculoskeletal negative musculoskeletal ROS (+)   Abdominal   Peds  Hematology negative hematology ROS (+)   Anesthesia Other Findings Day of surgery medications reviewed with the patient.  Reproductive/Obstetrics                             Anesthesia Physical Anesthesia Plan  ASA: III  Anesthesia Plan: General   Post-op Pain Management:    Induction: Intravenous  Airway Management Planned: Oral ETT  Additional Equipment:   Intra-op Plan:   Post-operative Plan: Extubation in OR  Informed Consent: I have reviewed the patients History and Physical, chart, labs and discussed the procedure including the risks, benefits and alternatives for the proposed anesthesia with the patient or authorized representative who has indicated his/her understanding and acceptance.   Dental advisory given  Plan Discussed with: CRNA  Anesthesia Plan Comments:         Anesthesia Quick Evaluation

## 2016-05-16 NOTE — Interval H&P Note (Signed)
History and Physical Interval Note:  05/16/2016 7:10 AM  Margaret Lopez  has presented today for surgery, with the diagnosis of MORBID OBESITY  The various methods of treatment have been discussed with the patient and family. After consideration of risks, benefits and other options for treatment, the patient has consented to  Procedure(s): LAPAROSCOPIC ROUX-EN-Y GASTRIC BYPASS WITH UPPER ENDOSCOPY (N/A) as a surgical intervention .  The patient's history has been reviewed, patient examined, no change in status, stable for surgery.  I have reviewed the patient's chart and labs.  Questions were answered to the patient's satisfaction.     Jiraiya Mcewan Rich Brave

## 2016-05-16 NOTE — Anesthesia Procedure Notes (Signed)
Procedure Name: Intubation Date/Time: 05/16/2016 7:27 AM Performed by: Glory Buff Pre-anesthesia Checklist: Patient identified, Emergency Drugs available, Suction available and Patient being monitored Patient Re-evaluated:Patient Re-evaluated prior to inductionOxygen Delivery Method: Circle system utilized Preoxygenation: Pre-oxygenation with 100% oxygen Intubation Type: IV induction Ventilation: Mask ventilation without difficulty Laryngoscope Size: Miller and 3 Grade View: Grade I Tube type: Oral Tube size: 7.5 mm Number of attempts: 1 Airway Equipment and Method: Stylet and Oral airway Placement Confirmation: ETT inserted through vocal cords under direct vision,  positive ETCO2 and breath sounds checked- equal and bilateral Secured at: 21 cm Tube secured with: Tape Dental Injury: Teeth and Oropharynx as per pre-operative assessment

## 2016-05-16 NOTE — Progress Notes (Signed)
Patient resting, no complaints of pain/nausea.  Sleepy but arouses easy when name called.  Patient had voided and ambulated to restroom.  Goals discussed with patient including ambulation in hallway and IS use.  Discussed criteria for beginning fluids post operatively.  Patient stated understanding.

## 2016-05-16 NOTE — Progress Notes (Signed)
Patient has home machine. Husband set up machine and RT placed sterile water in water chamber for humidification. Pt will place self on BIpap when ready.

## 2016-05-16 NOTE — H&P (View-Only) (Signed)
Margaret Lopez Patient #: [id number] DOB: 1970-04-18 Separated / Language: Margaret Lopez / Race: White Female   History of Present Illness  Patient words: She is attended the bariatric seminar and is interested in Roux-en-Y gastric bypass. She has significant reflux and takes high-dose PPI with occasional breakthrough symptoms. She stopped smoking on November 13 2015 using Chantix.  The patient is a 46 year old female who presents for a bariatric surgery evaluation. Associated symptoms include depressed mood, poor self esteem, joint pains and heartburn. Initial onset of obesity was after pregnancy. Initial presentation included inability to lose weight and abdominal obesity. Disease complications include sleep apnea and osteoarthritis. Current diet includes well balanced meals. less than once per week. Limitations to weight loss are sedentary lifestyle and depression. The patient is currently able to do activities of daily living without limitations, able to work without limitations and able to do housework without limitations. Past evaluation has included sleep study (uses bipap). Past treatment has included appetite suppressants, weight loss group and low carbohydrate diet. Surgical history: hysterectomy. Gastrointestinal History: Patient has heartburn. MBSQIP recognized comorbidities: Patient has sleep apnea and gastroesophageal reflux disease.  Has been doing well. Several insightful questions today. Takes lithium and depakote-will need to work with pharmacy post-op. Questions about diet- went to nutrition session on Monday. Activity and driving Reiterated our discussion about the surgery, the anticipated post-op recovery etc.   Problem List/Past Medical  MORBID OBESITY, UNSPECIFIED OBESITY TYPE (E66.01)   Past Surgical History  Foot Surgery  Right. Hysterectomy (not due to cancer) - Partial  Oral Surgery   Diagnostic Studies History  Colonoscopy  never Mammogram  within last year Pap  Smear  1-5 years ago  Allergies  ALOE   Medication History ALPRAZolam (0.5MG  Tablet, Oral daily) Active. Azelastine HCl (0.05% Solution, Ophthalmic daily) Active. ChlorproMAZINE HCl (25MG  Tablet, Oral daily) Active. Divalproex Sodium (500MG  Tablet DR, Oral daily) Active. EPINEPHrine (0.3MG /0.3ML Soln Auto-inj, Injection daily) Active. Esomeprazole Magnesium (40MG  Capsule DR, Oral daily) Active. FLUoxetine HCl (20MG  Capsule, Oral daily) Active. HydrOXYzine HCl (25MG  Tablet, Oral daily) Active. Levocetirizine Dihydrochloride (5MG  Tablet, Oral daily) Active. Lithium Carbonate ER (450MG  Tablet ER, Oral daily) Active. Montelukast Sodium (10MG  Tablet, Oral daily) Active. Ondansetron HCl (8MG  Tablet, Oral daily) Active. ValACYclovir HCl (1GM Tablet, Oral daily) Active. Zonisamide (25MG  Capsule, Oral daily) Active. Chantix (0.5MG  Tablet, Oral daily) Active. Medications Reconciled  Social History Alcohol use  Occasional alcohol use. Caffeine use  Carbonated beverages, Coffee. No drug use  Tobacco use  Former smoker.  Family History  Alcohol Abuse  Family Members In General. Arthritis  Mother. Bleeding disorder  Sister. Breast Cancer  Family Members In General. Depression  Family Members In General. Heart Disease  Family Members In General, Father. Hypertension  Father, Sister. Melanoma  Father. Migraine Headache  Father, Sister. Respiratory Condition  Family Members In General, Mother.  Pregnancy / Birth History Age at menarche  25 years. Gravida  2 Length (months) of breastfeeding  7-12 Maternal age  81-30 Para  2  Other Problems  Anxiety Disorder  Asthma  Depression  Gastroesophageal Reflux Disease  Migraine Headache  Sleep Apnea     Review of Systems  General Present- Fatigue and Weight Gain. Not Present- Appetite Loss, Chills, Fever, Night Sweats and Weight Loss. Skin Present- Dryness. Not Present- Change in Wart/Mole,  Hives, Jaundice, New Lesions, Non-Healing Wounds, Rash and Ulcer. HEENT Present- Seasonal Allergies and Wears glasses/contact lenses. Not Present- Earache, Hearing Loss, Hoarseness, Nose Bleed, Oral Ulcers, Ringing in the  Ears, Sinus Pain, Sore Throat, Visual Disturbances and Yellow Eyes. Respiratory Present- Snoring. Not Present- Bloody sputum, Chronic Cough, Difficulty Breathing and Wheezing. Breast Not Present- Breast Mass, Breast Pain, Nipple Discharge and Skin Changes. Cardiovascular Not Present- Chest Pain, Difficulty Breathing Lying Down, Leg Cramps, Palpitations, Rapid Heart Rate, Shortness of Breath and Swelling of Extremities. Gastrointestinal Present- Chronic diarrhea and Indigestion. Not Present- Abdominal Pain, Bloating, Bloody Stool, Change in Bowel Habits, Constipation, Difficulty Swallowing, Excessive gas, Gets full quickly at meals, Hemorrhoids, Nausea, Rectal Pain and Vomiting. Female Genitourinary Not Present- Frequency, Nocturia, Painful Urination, Pelvic Pain and Urgency. Musculoskeletal Not Present- Back Pain, Joint Pain, Joint Stiffness, Muscle Pain, Muscle Weakness and Swelling of Extremities. Neurological Present- Headaches. Not Present- Decreased Memory, Fainting, Numbness, Seizures, Tingling, Tremor, Trouble walking and Weakness. Psychiatric Present- Anxiety and Bipolar. Not Present- Change in Sleep Pattern, Depression, Fearful and Frequent crying. Endocrine Not Present- Cold Intolerance, Excessive Hunger, Hair Changes, Heat Intolerance, Hot flashes and New Diabetes. Hematology Not Present- Blood Thinners, Easy Bruising, Excessive bleeding, Gland problems, HIV and Persistent Infections.  Vitals Margaret Lopez RMA; 01/08/2016 3:59 PM) 01/08/2016 3:58 PM Weight: 228 lb Height: 63in Body Surface Area: 2.04 m Body Mass Index: 40.39 kg/m  Temp.: 98.33F  Pulse: 93 (Regular)  BP: 140/90 (Sitting, Left Arm, Standard)       Physical Exam General Mental  Status-Alert. General Appearance-Cooperative. Orientation-Oriented X4. Build & Nutrition-Obese. Posture-Normal posture.  Integumentary Global Assessment Normal Exam - Head/Face: no rashes, ulcers, lesions or evidence of photo damage. No palpable nodules or masses and Neck: no visible lesions or palpable masses.  Head and Neck Head-normocephalic, atraumatic with no lesions or palpable masses. Face Global Assessment - atraumatic. Thyroid Gland Characteristics - normal size and consistency.  Eye Eyeball - Bilateral-Extraocular movements intact. Sclera/Conjunctiva - Bilateral-No scleral icterus, No Discharge.  ENMT Nose and Sinuses Nose - no deformities observed, no swelling present.  Chest and Lung Exam Palpation Normal exam - Non-tender. Auscultation Breath sounds - Normal.  Cardiovascular Auscultation Rhythm - Regular. Heart Sounds - S1 WNL and S2 WNL. Carotid arteries - No Carotid bruit.  Abdomen Inspection Normal Exam - No Visible peristalsis, No Abnormal pulsations and No Paradoxical movements. Palpation/Percussion Normal exam - Soft, Non Tender, No Rebound tenderness, No Rigidity (guarding), No hepatosplenomegaly and No Palpable abdominal masses.  Peripheral Vascular Upper Extremity Palpation - Pulses bilaterally normal. Lower Extremity Palpation - Edema - Bilateral - No edema.  Neurologic Neurologic evaluation reveals -normal sensation and normal coordination.  Neuropsychiatric Mental status exam performed with findings of-able to articulate well with normal speech/language, rate, volume and coherence and thought content normal with ability to perform basic computations and apply abstract reasoning.  Musculoskeletal Normal Exam - Bilateral-Upper Extremity Strength Normal and Lower Extremity Strength Normal.    Assessment & Plan  MORBID OBESITY, UNSPECIFIED OBESITY TYPE (E66.01) Story: She has done a fair amount of research and is  considered her options and is gone to our seminar. She is interested in the gastric bypass, I think she is a good candidate for this based on her history of reflux. We discussed that she'll need to be nicotine free for at least 3 months before surgery and she quit in November. She has several insightful questions all of which were answered. We went over the surgery, risks and benefits, and the usual recovery period.  Her UGi demonstrated no hiatal hernia or reflux.  She is on several mood stabilizers, will need to confer with pharmacy to change any extended release  meds if possible (Depakote 2000mg  po qD, Xanax 0.5mg  po qD prn anxiety, Lithium CR 450mg  po qD)

## 2016-05-16 NOTE — Discharge Instructions (Signed)
° ° ° °GASTRIC BYPASS/SLEEVE ° Home Care Instructions ° ° These instructions are to help you care for yourself when you go home. ° °Call: If you have any problems. °• Call [phone] and ask for the surgeon on call °• If you need immediate assistance come to the ER at [facility]. Tell the ER staff you are a new post-op gastric bypass or gastric sleeve patient  °Signs and symptoms to report: • Severe  vomiting or nausea °o If you cannot handle clear liquids for longer than 1 day, call your surgeon °• Abdominal pain which does not get better after taking your pain medication °• Fever greater than 100.4°  F and chills °• Heart rate over 100 beats a minute °• Trouble breathing °• Chest pain °• Redness,  swelling, drainage, or foul odor at incision (surgical) sites °• If your incisions open or pull apart °• Swelling or pain in calf (lower leg) °• Diarrhea (Loose bowel movements that happen often), frequent watery, uncontrolled bowel movements °• Constipation, (no bowel movements for 3 days) if this happens: °o Take Milk of Magnesia, 2 tablespoons by mouth, 3 times a day for 2 days if needed °o Stop taking Milk of Magnesia once you have had a bowel movement °o Call your doctor if constipation continues °Or °o Take Miralax  (instead of Milk of Magnesia) following the label instructions °o Stop taking Miralax once you have had a bowel movement °o Call your doctor if constipation continues °• Anything you think is “abnormal for you” °  °Normal side effects after surgery: • Unable to sleep at night or unable to concentrate °• Irritability °• Being tearful (crying) or depressed ° °These are common complaints, possibly related to your anesthesia, stress of surgery, and change in lifestyle, that usually go away a few weeks after surgery. If these feelings continue, call your medical doctor.  °Wound Care: You may have surgical glue, steri-strips, or staples over your incisions after surgery °• Surgical glue: Looks like clear  film over your incisions and will wear off a little at a time °• Steri-strips: Adhesive strips of tape over your incisions. You may notice a yellowish color on skin under the steri-strips. This is used to make the steri-strips stick better. Do not pull the steri-strips off - let them fall off °• Staples: Staples may be removed before you leave the hospital °o If you go home with staples, call [facility] for an appointment with your surgeon’s nurse to have staples removed 10 days after surgery, [phone] °• Showering: You may shower two (2) days after your surgery unless your surgeon tells you differently °o Wash gently around incisions with warm soapy water, rinse well, and gently pat dry °o If you have a drain (tube from your incision), you may need someone to hold this while you shower °o No tub baths until staples are removed and incisions are healed °  °Medications: • Medications should be liquid or crushed if larger than the size of a dime °• Extended release pills (medication that releases a little bit at a time through the  day) should not be crushed °• Depending on the size and number of medications you take, you may need to space (take a few throughout the day)/change the time you take your medications so that you do not over-fill your pouch (smaller stomach) °• Make sure you follow-up with you primary care physician to make medication changes needed during rapid weight loss and life -style changes °•   If you have diabetes, follow up with your doctor that orders your diabetes medication(s) within one week after surgery and check your blood sugar regularly   Do not drive while taking narcotics (pain medications)   Do not take acetaminophen (Tylenol) and Roxicet or Lortab Elixir at the same time since these pain medications contain acetaminophen   Diet:  First 2 Weeks You will see the nutritionist about two (2) weeks after your surgery. The nutritionist will increase the types of  foods you can eat if you are handling liquids well:  If you have severe vomiting or nausea and cannot handle clear liquids lasting longer than 1 day call your surgeon Protein Shake  Drink at least 2 ounces of shake 5-6 times per day  Each serving of protein shakes (usually 8-12 ounces) should have a minimum of: o 15 grams of protein o And no more than 5 grams of carbohydrate  Goal for protein each day: o Men = 80 grams per day o Women = 60 grams per day     Protein powder may be added to fluids such as non-fat milk or Lactaid milk or Soy milk (limit to 35 grams added protein powder per serving)  Hydration  Slowly increase the amount of water and other clear liquids as tolerated (See Acceptable Fluids)  Slowly increase the amount of protein shake as tolerated  Sip fluids slowly and throughout the day  May use sugar substitutes in small amounts (no more than 6-8 packets per day; i.e. Splenda)  Fluid Goal  The first goal is to drink at least 8 ounces of protein shake/drink per day (or as directed by the nutritionist); some examples of protein shakes are Johnson [employer] Johnson, AMR Corporation, EAS Edge HP, and Unjury. - See handout from pre-op Bariatric Education Class: o Slowly increase the amount of protein shake you drink as tolerated o You may find it easier to slowly sip shakes throughout the day o It is important to get your proteins in first  Your fluid goal is to drink 64-100 ounces of fluid daily o It may take a few weeks to build up to this   32 oz. (or more) should be clear liquids And  32 oz. (or more) should be full liquids (see below for examples)  Liquids should not contain sugar, caffeine, or carbonation  Clear Liquids:  Water of Sugar-free flavored water (i.e. Fruit HO, Propel)  Decaffeinated coffee or tea (sugar-free)  Crystal lite, Wylers Lite, Minute Maid Lite  Sugar-free Jell-O  Bouillon or broth  Sugar-free Popsicle:    - Less than 20 calories  each; Limit 1 per day  Full Liquids:                   Protein Shakes/Drinks + 2 choices per day of other full liquids  Full liquids must be: o No More Than 12 grams of Carbs per serving o No More Than 3 grams of Fat per serving  Strained low-fat cream soup  Non-Fat milk  Fat-free Lactaid Milk  Sugar-free yogurt (Dannon Lite & Fit, Greek yogurt)    Vitamins and Minerals  Start 1 day after surgery unless otherwise directed by your surgeon  2 Chewable Multivitamin / Multimineral Supplement with iron   Vitamin B-12, 350-500 micrograms sub-lingual (place tablet under the tongue) each day  Chewable Calcium Citrate with Vitamin D-3 (Example: 3 Chewable Calcium  Plus 600 with Vitamin D-3) o Take 500 mg three (3) times a day for a total of  1500 mg each day o Do not take all 3 doses of calcium at one time as it may cause constipation, and you can only absorb 500 mg at a time o Do not mix multivitamins containing iron with calcium supplements;  take 2 hours apart  Menstruating women and those at risk for anemia ( a blood disease that causes weakness) may need extra iron o Talk to your doctor to see if you need more iron  If you need extra iron: Total daily Iron recommendation (including Vitamins) is 50 to 100 mg Iron/day  Do not stop taking or change any vitamins or minerals until you talk to your nutritionist or surgeon  Your nutritionist and/or surgeon must approve all vitamin and mineral supplements   Activity and Exercise: It is important to continue walking at home. Limit your physical activity as instructed by your doctor. During this time, use these guidelines:  Do not lift anything greater than ten  (10) pounds for at least two (2) weeks  Do not go back to work or drive until Engineer, production says you can  You may have sex when you feel comfortable o It is VERY important for female patients to use a reliable birth control method; fertility often increase after surgery o Do  not get pregnant for at least 18 months  Start exercising as soon as your doctor tells you that you can o Make sure your doctor approves any physical activity  Start with a simple walking program  Walk 5-15 minutes each day, 7 days per week  Slowly increase until you are walking 30-45 minutes per day  Consider joining our [place] program. [phone] or email [email or web address]   Special Instructions Things to remember:  Free counseling is available for you and your family through collaboration between [facility] and [place] [facility]. Please call [phone] and leave a message  Use your CPAP when sleeping if this applies to you  Consider buying a medical alert bracelet that says you had lap-band surgery     You will likely have your first fill (fluid added to your band) 6 - 8 weeks after surgery  [facility] has a free Bariatric Surgery Support Group that meets monthly, the 3rd Thursday, [employer]. You can see classes online at [email or web address]  It is very important to keep all follow up appointments with your surgeon, nutritionist, primary care physician, and behavioral health practitioner o After the first year, please follow up with your bariatric surgeon and nutritionist at least once a year in order to maintain best weight loss results                    [facility]:  [place]: [phone]               Bariatric Nurse Coordinator: [phone]  Gastric Bypass/Sleeve Home Care Instructions  Rev. 02/2012                                                         Reviewed and Vilinda Boehringer  by [facility] Patient Education Committee, Jan, 2014 ° ° ° ° ° ° ° ° ° °

## 2016-05-17 ENCOUNTER — Encounter (HOSPITAL_COMMUNITY): Payer: Self-pay | Admitting: Surgery

## 2016-05-17 LAB — CBC WITH DIFFERENTIAL/PLATELET
BASOS ABS: 0 10*3/uL (ref 0.0–0.1)
BASOS PCT: 0 %
EOS ABS: 0 10*3/uL (ref 0.0–0.7)
Eosinophils Relative: 0 %
HEMATOCRIT: 34.9 % — AB (ref 36.0–46.0)
HEMOGLOBIN: 11.6 g/dL — AB (ref 12.0–15.0)
Lymphocytes Relative: 16 %
Lymphs Abs: 1.7 10*3/uL (ref 0.7–4.0)
MCH: 30.7 pg (ref 26.0–34.0)
MCHC: 33.2 g/dL (ref 30.0–36.0)
MCV: 92.3 fL (ref 78.0–100.0)
MONOS PCT: 8 %
Monocytes Absolute: 0.9 10*3/uL (ref 0.1–1.0)
NEUTROS ABS: 8.1 10*3/uL — AB (ref 1.7–7.7)
NEUTROS PCT: 76 %
Platelets: 222 10*3/uL (ref 150–400)
RBC: 3.78 MIL/uL — AB (ref 3.87–5.11)
RDW: 13 % (ref 11.5–15.5)
WBC: 10.7 10*3/uL — AB (ref 4.0–10.5)

## 2016-05-17 LAB — BASIC METABOLIC PANEL
ANION GAP: 11 (ref 5–15)
BUN: 10 mg/dL (ref 6–20)
CHLORIDE: 106 mmol/L (ref 101–111)
CO2: 23 mmol/L (ref 22–32)
Calcium: 8.6 mg/dL — ABNORMAL LOW (ref 8.9–10.3)
Creatinine, Ser: 0.59 mg/dL (ref 0.44–1.00)
GFR calc non Af Amer: >60 mL/min (ref >60)
Glucose, Bld: 116 mg/dL — ABNORMAL HIGH (ref 65–99)
POTASSIUM: 3.8 mmol/L (ref 3.5–5.1)
SODIUM: 140 mmol/L (ref 135–145)

## 2016-05-17 LAB — GLUCOSE, CAPILLARY
GLUCOSE-CAPILLARY: 100 mg/dL — AB (ref 65–99)
GLUCOSE-CAPILLARY: 105 mg/dL — AB (ref 65–99)
GLUCOSE-CAPILLARY: 109 mg/dL — AB (ref 65–99)
GLUCOSE-CAPILLARY: 112 mg/dL — AB (ref 65–99)
GLUCOSE-CAPILLARY: 116 mg/dL — AB (ref 65–99)
GLUCOSE-CAPILLARY: 118 mg/dL — AB (ref 65–99)
GLUCOSE-CAPILLARY: 90 mg/dL (ref 65–99)

## 2016-05-17 LAB — MAGNESIUM: MAGNESIUM: 1.8 mg/dL (ref 1.7–2.4)

## 2016-05-17 MED ORDER — MAGNESIUM SULFATE 2 GM/50ML IV SOLN
2.0000 g | Freq: Once | INTRAVENOUS | Status: AC
  Administered 2016-05-17: 2 g via INTRAVENOUS
  Filled 2016-05-17: qty 50

## 2016-05-17 MED ORDER — VALPROATE SODIUM 250 MG/5ML PO SOLN
500.0000 mg | Freq: Two times a day (BID) | ORAL | Status: DC
  Administered 2016-05-17: 500 mg via ORAL
  Filled 2016-05-17 (×2): qty 10

## 2016-05-17 NOTE — Progress Notes (Signed)
Patient alert and oriented, Post op day 1.  Provided support and encouragement.  Encouraged pulmonary toilet, ambulation and small sips of liquids.  All questions answered.  Will continue to monitor. 

## 2016-05-17 NOTE — Op Note (Signed)
Name:  Margaret Lopez MRN: [id number] Date of Surgery: 05/17/2016  Preop Diagnosis:  Morbid Obesity, S/P RYGB  Postop Diagnosis:  Morbid Obesity, S/P RYGB (Weight - 228, BMI - 40.4)  Procedure:  Upper endoscopy  (Intraoperative)  Surgeon:  Alphonsa Overall, M.D.  Anesthesia:  GET  Indications for procedure: Margaret Lopez is a 46 y.o. female whose primary care physician is Lavone Orn, MD and has completed a Roux-en-Y gastric bypass today by Dr. Kae Heller.  I am doing an intraoperative upper endoscopy to evaluate the gastric pouch and the gastro-jejunal anastomosis.  Operative Note: The patient is under general anesthesia.  Dr. Kae Heller is laparoscoping the patient while I do an upper endoscopy to evaluate the stomach pouch and gastrojejunal anastomosis.  With the patient intubated, I passed the Pentax endoscope without difficulty down the esophagus.  The esophago-gastric junction was at 39 cm.  The gastro-jejunal anastomosis was at 43 cm.  The mucosa of the stomach looked viable and the staple line was intact without bleeding.  The gastro-jejunal anastomosis looked okay.  While I insufflated the stomach pouch with air, Dr. Kae Heller clamped off the efferent limb of the jejunum.  She then flooded the upper abdomen with saline to put the gastric pouch and gastro-jejunal anastomosis under saline.  There was no bubbling or evidence of a leak.    The scope was then withdrawn.  The esophagus was unremarkable and the patient tolerated the endoscopy without difficulty.  Alphonsa Overall, MD, [facility] Pager: [phone] Office phone:  [phone]

## 2016-05-17 NOTE — Progress Notes (Signed)
Pt has home cpap and places self on/off machine.  Rt will monitor as necessary.  Pt mentioned stomach distention after wearing cpap througout the night and wanted to know what we could do or she could do.  RT recommended she speak with her physician and possibly reduce pressures to avoid this problem.  Rt explained we are not allowed to change pt's settings and pressure through hospital policy.  Rt told pt to contact RN if she needed further RT assistance.

## 2016-05-17 NOTE — Progress Notes (Signed)
S: Some nausea with meds last night, resolved now. No dysphagia. Feels like she needs to burp. Minimal incisional pain. Was up to urinate several times overnight- not recorded. Had to get labs from finger stick.   Vitals, labs, intake/output, and orders reviewed at this time. Tmax 99. HR 54-86. Normo- to hypertensive, sats high 90s. 191mL PO recorded and one urine occurrence recorded- patient reports at least 4.   Gen: A&Ox3, no distress  H&N: EOMI, atraumatic, neck supple Chest: unlabored respirations, RRR Abd: soft, appropriately tender around incisions, nondistended, incisions c/d/i with dermabond. Ext: warm, no edema Neuro: grossly normal  Lines/tubes/drains: PIV  A/P:  POD 1 lap RYGB, doing well - Bariatric clears & protein shakes as able -Ambulate -Incentive spirometry -CBC looks fine, BMP pending will replace lytes as needed  Strict intake and output needs to be recorded for this patient.    Romana Juniper, MD [facility], [place] Pager [phone]

## 2016-05-17 NOTE — Progress Notes (Signed)
Nutrition Education Note  Received consult for diet education per DROP protocol.   Discussed 2 week post op diet with pt. Emphasized that liquids must be non carbonated, non caffeinated, and sugar free. Fluid goals discussed. Pt to follow up with outpatient bariatric RD for further diet progression after 2 weeks. Multivitamins and minerals also reviewed. Teach back method used, pt expressed understanding, expect good compliance.  Pt states that she is feeling well overall today. She is POD #1 lap Roux-en-Y gastric bypass. Pt has been sipping on clears throughout the day and had a little bit of Premier Protein without issue. She has purchased protein shakes for home use but has not purchased multivitamin because she is hoping to recall the one provided as a sample in pre-op class as she enjoyed that variety; this RD sent inbox message to RD who conducted pre-op class to request she call pt with the name of multivitamin sampled. Pt denies any other nutrition-related questions or concerns at this time.    Diet: First 2 Weeks  You will see the nutritionist about two (2) weeks after your surgery. The nutritionist will increase the types of foods you can eat if you are handling liquids well:  If you have severe vomiting or nausea and cannot handle clear liquids lasting longer than 1 day, call your surgeon  Protein Shake  Drink at least 2 ounces of shake 5-6 times per day  Each serving of protein shakes (usually 8 - 12 ounces) should have a minimum of:  15 grams of protein  And no more than 5 grams of carbohydrate  Goal for protein each day:  Men = 80 grams per day  Women = 60 grams per day  Protein powder may be added to fluids such as non-fat milk or Lactaid milk or Soy milk (limit to 35 grams added protein powder per serving)   Hydration  Slowly increase the amount of water and other clear liquids as tolerated (See Acceptable Fluids)  Slowly increase the amount of protein shake as tolerated  Sip  fluids slowly and throughout the day  May use sugar substitutes in small amounts (no more than 6 - 8 packets per day; i.e. Splenda)   Fluid Goal  The first goal is to drink at least 8 ounces of protein shake/drink per day (or as directed by the nutritionist); some examples of protein shakes are Premier Protein, Johnson & Johnson, AMR Corporation, EAS Edge HP, and Unjury. See handout from pre-op Bariatric Education Class:  Slowly increase the amount of protein shake you drink as tolerated  You may find it easier to slowly sip shakes throughout the day  It is important to get your proteins in first  Your fluid goal is to drink 64 - 100 ounces of fluid daily  It may take a few weeks to build up to this  32 oz (or more) should be clear liquids  And  32 oz (or more) should be full liquids (see below for examples)  Liquids should not contain sugar, caffeine, or carbonation   Clear Liquids:  Water or Sugar-free flavored water (i.e. Fruit H2O, Propel)  Decaffeinated coffee or tea (sugar-free)  Crystal Lite, Wyler's Lite, Minute Maid Lite  Sugar-free Jell-O  Bouillon or broth  Sugar-free Popsicle: *Less than 20 calories each; Limit 1 per day   Full Liquids:  Protein Shakes/Drinks + 2 choices per day of other full liquids  Full liquids must be:  No More Than 12 grams of Carbs per serving  No More Than 3 grams of Fat per serving  Strained low-fat cream soup  Non-Fat milk  Fat-free Lactaid Milk  Sugar-free yogurt (Dannon Lite & Fit, [place] yogurt, Oikos Zero)     Jarome Matin, MS, RD, LDN, [facility] Inpatient Clinical Dietitian Pager # [phone] After hours/weekend pager # [phone]

## 2016-05-18 ENCOUNTER — Inpatient Hospital Stay (HOSPITAL_COMMUNITY): Payer: BLUE CROSS/BLUE SHIELD

## 2016-05-18 DIAGNOSIS — M79609 Pain in unspecified limb: Secondary | ICD-10-CM

## 2016-05-18 DIAGNOSIS — R609 Edema, unspecified: Secondary | ICD-10-CM

## 2016-05-18 LAB — CBC WITH DIFFERENTIAL/PLATELET
Basophils Absolute: 0 10*3/uL (ref 0.0–0.1)
Basophils Relative: 0 %
EOS ABS: 0 10*3/uL (ref 0.0–0.7)
Eosinophils Relative: 0 %
HCT: 35.7 % — ABNORMAL LOW (ref 36.0–46.0)
Hemoglobin: 11.3 g/dL — ABNORMAL LOW (ref 12.0–15.0)
LYMPHS ABS: 2 10*3/uL (ref 0.7–4.0)
Lymphocytes Relative: 19 %
MCH: 29.7 pg (ref 26.0–34.0)
MCHC: 31.7 g/dL (ref 30.0–36.0)
MCV: 93.7 fL (ref 78.0–100.0)
MONOS PCT: 7 %
Monocytes Absolute: 0.7 10*3/uL (ref 0.1–1.0)
Neutro Abs: 7.9 10*3/uL — ABNORMAL HIGH (ref 1.7–7.7)
Neutrophils Relative %: 74 %
PLATELETS: 245 10*3/uL (ref 150–400)
RBC: 3.81 MIL/uL — ABNORMAL LOW (ref 3.87–5.11)
RDW: 13 % (ref 11.5–15.5)
WBC: 10.6 10*3/uL — AB (ref 4.0–10.5)

## 2016-05-18 LAB — GLUCOSE, CAPILLARY
GLUCOSE-CAPILLARY: 101 mg/dL — AB (ref 65–99)
GLUCOSE-CAPILLARY: 113 mg/dL — AB (ref 65–99)
Glucose-Capillary: 104 mg/dL — ABNORMAL HIGH (ref 65–99)

## 2016-05-18 MED ORDER — LITHIUM CARBONATE 150 MG PO CAPS: 150.0000 mg | ORAL_CAPSULE | Freq: Three times a day (TID) | ORAL | 0 refills | Status: DC

## 2016-05-18 MED ORDER — VALPROATE SODIUM 250 MG/5ML PO SOLN
1000.0000 mg | Freq: Two times a day (BID) | ORAL | Status: DC
  Administered 2016-05-18: 1000 mg via ORAL
  Filled 2016-05-18: qty 20

## 2016-05-18 MED ORDER — VALPROATE SODIUM 250 MG/5ML PO SOLN: 1000.0000 mg | Freq: Two times a day (BID) | ORAL | 0 refills | Status: DC

## 2016-05-18 NOTE — Progress Notes (Signed)
Patient alert and oriented, Post op day 2.  Provided support and encouragement.  Encouraged pulmonary toilet, ambulation and small sips of liquids. 6 ounces of protein since last night.  Concerns over right arm swelling. Arm is warm to touch/swollen.  Radial pulse strong extremity warm cap refill WNL. Discussed with patient note left for Dr Kae Heller on white board.  Patient to discuss when Dr Kae Heller rounds. All questions answered.  Will continue to monitor.

## 2016-05-18 NOTE — Discharge Summary (Signed)
Physician Discharge Summary  Margaret Lopez VFI:[id number] DOB: [birth year] DOA: 05/16/2016  PCP: Lavone Orn, MD  Admit date: 05/16/2016 Discharge date: 05/18/2016  Recommendations for Outpatient Follow-up:  1. To see Dr Doyne Keel (psychiatry) on 5/24 to discuss changes in medications  2. Follow up with PCP in the next 1-2 weeks to check in and see about changing CPAP settings  Follow-up Information    Clovis Riley, MD. Go on 05/27/2016.   Specialty:  General Surgery Why:  at [address] Contact information: [phone]        Clovis Riley, MD Follow up.   Specialty:  General Surgery Contact information: [phone]          Discharge Diagnoses:  Active Problems:   Morbid obesity [facility])   Surgical Procedure: Laparoscopic Roux-en-Y gastric bypass, upper endoscopy  Discharge Condition: Good Disposition: Home  Diet recommendation: Postoperative gastric bypass diet  Filed Weights   05/16/16 0530 05/17/16 2036  Weight: 103.4 kg (228 lb) 109.2 kg (240 lb 11.9 oz)     Hospital Course:  The patient was admitted for a planned laparoscopic Roux-en-Y gastric bypass. Please see operative note. Preoperatively the patient was given 40mg  lovenox for DVT prophylaxis. Postoperative prophylactic Lovenox dosing was started on the morning of postoperative day 1. The patient was started on ice chips and water which they tolerated. On postoperative day 1 the patient's diet was advanced to protein shakes which they also tolerated. The patient was ambulating without difficulty. Their vital signs are stable without fever or tachycardia. Their hemoglobin had remained stable. The patient was maintained on their home settings for CPAP therapy. She had some warmth and swelling of the right arm which was duplexed and negative for DVT, likely small hematoma from multiple vein sticks for labs, on POD 2 The patient had received discharge instructions and counseling. They were deemed stable for  discharge.   Discharge Instructions  Discharge Instructions    Ambulate hourly while awake    Complete by:  As directed    Call MD for:  difficulty breathing, headache or visual disturbances    Complete by:  As directed    Call MD for:  persistant dizziness or light-headedness    Complete by:  As directed    Call MD for:  persistant nausea and vomiting    Complete by:  As directed    Call MD for:  redness, tenderness, or signs of infection (pain, swelling, redness, odor or green/yellow discharge around incision site)    Complete by:  As directed    Call MD for:  severe uncontrolled pain    Complete by:  As directed    Call MD for:  temperature >101 F    Complete by:  As directed    Driving Restrictions    Complete by:  As directed    No driving while on pain medications   Incentive spirometry    Complete by:  As directed    Perform hourly while awake   No dressing needed    Complete by:  As directed    OK to shower and pat dry. Skin glue will flake off after a couple weeks. No lotions or ointments to incisions.     Allergies as of 05/18/2016      Reactions   Morphine And Related Itching   Phenergan [promethazine Hcl]    " knocks out for several hours"   Aloe Hives   Penicillins Rash   Has patient had a PCN reaction causing immediate  rash, facial/tongue/throat swelling, SOB or lightheadedness with hypotension: Yes Has patient had a PCN reaction causing severe rash involving mucus membranes or skin necrosis: No Has patient had a PCN reaction that required hospitalization No Has patient had a PCN reaction occurring within the last 10 years: No If all of the above answers are "NO", then may proceed with Cephalosporin use.   Sulfa Antibiotics Rash      Medication List    STOP taking these medications   chlorproMAZINE 25 MG tablet Commonly known as:  THORAZINE   divalproex 500 MG DR tablet Commonly known as:  DEPAKOTE   lithium carbonate 450 MG CR tablet Commonly known  as:  ESKALITH Replaced by:  lithium carbonate 150 MG capsule     TAKE these medications   acetaminophen 500 MG tablet Commonly known as:  TYLENOL Take 1,000 mg by mouth every 6 (six) hours as needed (for pain/headaches).   albuterol 108 (90 Base) MCG/ACT inhaler Commonly known as:  PROVENTIL HFA;VENTOLIN HFA Inhale 1-2 puffs into the lungs every 6 (six) hours as needed for wheezing or shortness of breath.   ALPRAZolam 0.5 MG tablet Commonly known as:  XANAX Take 1 tablet (0.5 mg total) by mouth at bedtime as needed for anxiety.   azelastine 0.05 % ophthalmic solution Commonly known as:  OPTIVAR Place 1 drop into both eyes as needed (for itchy eyes).   EPINEPHrine 0.3 mg/0.3 mL Soaj injection Commonly known as:  EPI-PEN Inject 0.3 mg into the muscle daily as needed (for anaphylatic reactions.).   esomeprazole 40 MG capsule Commonly known as:  NEXIUM Take 40 mg by mouth 2 (two) times daily.   hydrOXYzine 25 MG tablet Commonly known as:  ATARAX/VISTARIL Take 25 mg by mouth at bedtime.   levocetirizine 5 MG tablet Commonly known as:  XYZAL Take 5 mg by mouth at bedtime.   lithium carbonate 150 MG capsule Take 1 capsule (150 mg total) by mouth 3 (three) times daily with meals. Replaces:  lithium carbonate 450 MG CR tablet   ondansetron 8 MG tablet Commonly known as:  ZOFRAN Take 4-8 mg by mouth daily as needed for nausea or vomiting.   valACYclovir 500 MG tablet Commonly known as:  VALTREX Take 500 mg by mouth daily as needed (FOR FLARE UPS).   Valproate Sodium 250 MG/5ML Soln solution Commonly known as:  DEPAKENE Take 20 mLs (1,000 mg total) by mouth 2 (two) times daily.   zonisamide 25 MG capsule Commonly known as:  ZONEGRAN Take 75 mg by mouth at bedtime.      Follow-up Information    Clovis Riley, MD. Go on 05/27/2016.   Specialty:  General Surgery Why:  at [address] Contact information: [phone]        Clovis Riley, MD Follow up.    Specialty:  General Surgery Contact information: [phone]            The results of significant diagnostics from this hospitalization (including imaging, microbiology, ancillary and laboratory) are listed below for reference.    Significant Diagnostic Studies: No results found.  Labs: Basic Metabolic Panel:  Recent Labs Lab 05/17/16 0801  NA 140  K 3.8  CL 106  CO2 23  GLUCOSE 116*  BUN 10  CREATININE 0.59  CALCIUM 8.6*  MG 1.8   Liver Function Tests: No results for input(s): AST, ALT, ALKPHOS, BILITOT, PROT, ALBUMIN in the last 168 hours.  CBC:  Recent Labs Lab 05/16/16 1055 05/17/16 0801 05/18/16 0449  WBC  --  10.7* 10.6*  NEUTROABS  --  8.1* 7.9*  HGB 12.3 11.6* 11.3*  HCT 38.1 34.9* 35.7*  MCV  --  92.3 93.7  PLT  --  222 245    CBG:  Recent Labs Lab 05/17/16 2004 05/17/16 2328 05/18/16 0348 05/18/16 0735 05/18/16 0947  GLUCAP 90 112* 101* 113* 104*    Active Problems:   Morbid obesity ([place])   Time coordinating discharge: 43min  Signed:  [facility], [place] 05/18/2016, 10:16 AM

## 2016-05-18 NOTE — Progress Notes (Signed)
Patient alert and oriented, pain is controlled. Patient is tolerating fluids, advanced to protein shake today, patient is tolerating well. Reviewed Gastric Bypass discharge instructions with patient and patient is able to articulate understanding. Provided information on BELT program, Support Group and [facility] outpatient pharmacy. All questions answered, will continue to monitor.    

## 2016-05-18 NOTE — Progress Notes (Addendum)
VASCULAR LAB PRELIMINARY  PRELIMINARY  PRELIMINARY  PRELIMINARY  Right upper extremity venous duplex completed.    Preliminary report:  Right :  No evidence of DVT or superficial thrombosis. There is an area of mixed echoes and fluid at the level of the antecubital fossa consistent with a hematoma.    Porchea Charrier, [place], RVS 05/18/2016, 11:09 AM

## 2016-05-18 NOTE — Progress Notes (Signed)
Pharmacy consult: Patient on several mood stabilizers, extended release/ pills. s/p gastric bypass. Anything that can be changed to liquid or capsule?  Rec:  Depakote 2000 DR qday>>depakote sprinkles 500 mg PO bid>>valproate liquid 1000 mg po BID Eskalith 450 CR qhs>> lithium 150 mg cap tid   Eudelia Bunch, Pharm.D. [phone] 05/18/2016 7:21 AM

## 2016-05-18 NOTE — Progress Notes (Signed)
Discharge planning, no HH needs identified. [phone] 

## 2016-05-18 NOTE — Progress Notes (Signed)
S: Tolerating liquids. Nausea when she gets full and trying to get her meds down. No dysphagia. C/o bloating from gas from CPAP machine. Walking the halls. Also notes right arm is swollen and warm.   Vitals, labs, intake/output, and orders reviewed at this time. Tmax 98.6. HR 58-87. Mildly hypertensive. Sats 97% on room air. PO 667mL yesterday, UOP 850 plus 2 voids. HGB stable at 11.3 today, WBC stable 10.6.   Gen: A&Ox3, no distress  H&N: EOMI, atraumatic, neck supple Chest: unlabored respirations, RRR Abd: soft, appropriatelytender, nondistended, incisions c/d/i with dermabond Ext: warm, right arm is warmer than left and minimally swollen.  Neuro: grossly normal  Lines/tubes/drains: PIV  A/P:  POD 2 lap RYGB, doing well -Continue PO as tolerated, ambulating, pulm toilet. We discussed often takes a while to work up to the full 64oz water per day.  -Will check RUE duplex -Plan for discharge later today. Scripts for new formulation of lithium and depakene on chart. She has appt with her psychiatrist on 5/24. To see me on 5/18.    Romana Juniper, MD [facility], [place] Pager [phone]

## 2016-05-23 ENCOUNTER — Telehealth: Payer: Self-pay | Admitting: Skilled Nursing Facility1

## 2016-05-23 NOTE — Telephone Encounter (Signed)
Pt originally questioned which multivitamin she should take but got that information from the bariatric nurse coordinator. Pt then questioned what could she chew because she was missing chewing foods. Pt also questioned what information will she be given in post-op class and if it was pureed food for the next step.    Dietitian educated the pt on the post-op diet and offered ideas for chewing such as ice, frozen protein shakes, and popscicles.

## 2016-05-25 ENCOUNTER — Telehealth (HOSPITAL_COMMUNITY): Payer: Self-pay

## 2016-05-25 DIAGNOSIS — Z9884 Bariatric surgery status: Secondary | ICD-10-CM | POA: Diagnosis not present

## 2016-05-25 DIAGNOSIS — G4733 Obstructive sleep apnea (adult) (pediatric): Secondary | ICD-10-CM | POA: Diagnosis not present

## 2016-05-25 NOTE — Telephone Encounter (Signed)
Made discharge phone call to patient.  Asking the following questions.    1. Do you have someone to care for you now that you are home?  independent 2. Are you having pain now that is not relieved by your pain medication?  Yes trying to wean off because making dizzy 3. Are you able to drink the recommended daily amount of fluids (48 ounces minimum/day) and protein (60-80 grams/day) as prescribed by the dietitian or nutritional counselor?  60 grams of protein 48 ounces of water 4. Are you taking the vitamins and minerals as prescribed?  Yes taking opurity and calcium 5. Do you have the "on call" number to contact your surgeon if you have a problem or question?  yes 6. Are your incisions free of redness, swelling or drainage? (If steri strips, address that these can fall off, shower as tolerated) incisions looking good 7. Have your bowels moved since your surgery?  If not, are you passing gas?  yes 8. Are you up and walking 3-4 times per day? Moving more today for first to doctor and [employer]  9. Were you provided your discharge medications before your surgery or before you were discharged from the hospital and are you taking them without problem?  yes

## 2016-05-31 ENCOUNTER — Encounter: Payer: BLUE CROSS/BLUE SHIELD | Attending: Surgery | Admitting: Skilled Nursing Facility1

## 2016-05-31 DIAGNOSIS — Z713 Dietary counseling and surveillance: Secondary | ICD-10-CM | POA: Diagnosis not present

## 2016-06-01 ENCOUNTER — Encounter: Payer: Self-pay | Admitting: Skilled Nursing Facility1

## 2016-06-01 ENCOUNTER — Telehealth (HOSPITAL_COMMUNITY): Payer: Self-pay

## 2016-06-01 NOTE — Progress Notes (Signed)
Bariatric Class:  Appt start time: 1530 end time:  1630.  2 Week Post-Operative Nutrition Class  Patient was seen on 05/31/2016 for Post-[facility]ative Nutrition [facility].   Pt states she has had mashed potatoes and has had some dizzy spells (pt admits to only drinking about 30 ounces of fluid a day).   Surgery date: 05/16/2016 Surgery type: RYGB Start weight at [facility]: 227.3 Weight today: 219.2  TANITA  BODY COMP RESULTS  N/A   BMI (kg/m^2)    Fat Mass (lbs)    Fat Free Mass (lbs)    Total Body Water (lbs)     The following the learning objectives were met by the patient during this course:  Identifies Phase 3A (Soft, High Proteins) Dietary Goals and will begin from 2 weeks post-operatively to 2 months post-operatively  Identifies appropriate sources of fluids and proteins   States protein recommendations and appropriate sources post-operatively  Identifies the need for appropriate texture modifications, mastication, and bite sizes when consuming solids  Identifies appropriate multivitamin and calcium sources post-operatively  Describes the need for physical activity post-operatively and will follow MD recommendations  States when to call healthcare provider regarding medication questions or post-operative complications  Handouts given during class include:  Phase 3A: Soft, High Protein Diet Handout  Follow-Up Plan: Patient will follow-up at [facility] in 6 weeks for 2 month post-op nutrition visit for diet advancement per MD.

## 2016-06-01 NOTE — Telephone Encounter (Signed)
Spoke with patient after she attempted to eat protein after post op class.  Attempted hamburger which made her dry heave and nauseated.  We discussed other alternatives as hamburger may be a little too much at this point.  Suggested scrambled egg or cottage cheese.  Patient still only getting in around 40 ounces of fluid. Discussed dehydration signs and symptoms.  We goal set to increase fluids by 10 ounces using a timer as a reminder to drink every 15 minutes and to drink things she enjoys sugar free kool aid and broth.  Patient states understanding.  We will discuss again in the morning.

## 2016-06-02 ENCOUNTER — Ambulatory Visit (INDEPENDENT_AMBULATORY_CARE_PROVIDER_SITE_OTHER): Payer: BLUE CROSS/BLUE SHIELD | Admitting: Psychiatry

## 2016-06-02 ENCOUNTER — Telehealth (HOSPITAL_COMMUNITY): Payer: Self-pay

## 2016-06-02 ENCOUNTER — Encounter (HOSPITAL_COMMUNITY): Payer: Self-pay | Admitting: Psychiatry

## 2016-06-02 DIAGNOSIS — F319 Bipolar disorder, unspecified: Secondary | ICD-10-CM | POA: Diagnosis not present

## 2016-06-02 DIAGNOSIS — Z888 Allergy status to other drugs, medicaments and biological substances status: Secondary | ICD-10-CM | POA: Diagnosis not present

## 2016-06-02 DIAGNOSIS — Z882 Allergy status to sulfonamides status: Secondary | ICD-10-CM | POA: Diagnosis not present

## 2016-06-02 DIAGNOSIS — G4733 Obstructive sleep apnea (adult) (pediatric): Secondary | ICD-10-CM | POA: Diagnosis not present

## 2016-06-02 DIAGNOSIS — Z880 Allergy status to penicillin: Secondary | ICD-10-CM

## 2016-06-02 DIAGNOSIS — Z9109 Other allergy status, other than to drugs and biological substances: Secondary | ICD-10-CM

## 2016-06-02 DIAGNOSIS — F411 Generalized anxiety disorder: Secondary | ICD-10-CM

## 2016-06-02 DIAGNOSIS — Z87891 Personal history of nicotine dependence: Secondary | ICD-10-CM | POA: Diagnosis not present

## 2016-06-02 DIAGNOSIS — Z79899 Other long term (current) drug therapy: Secondary | ICD-10-CM | POA: Diagnosis not present

## 2016-06-02 DIAGNOSIS — G4700 Insomnia, unspecified: Secondary | ICD-10-CM | POA: Diagnosis not present

## 2016-06-02 MED ORDER — ALPRAZOLAM 0.5 MG PO TABS: 0.5000 mg | ORAL_TABLET | Freq: Every evening | ORAL | 1 refills | Status: DC | PRN

## 2016-06-02 MED ORDER — LITHIUM CARBONATE 150 MG PO CAPS: 150.0000 mg | ORAL_CAPSULE | Freq: Three times a day (TID) | ORAL | 1 refills | Status: DC

## 2016-06-02 MED ORDER — VALPROATE SODIUM 250 MG/5ML PO SOLN: 1000.0000 mg | Freq: Two times a day (BID) | ORAL | 0 refills | Status: DC

## 2016-06-02 NOTE — Telephone Encounter (Signed)
Increased fluid by 8 ounces has eaten fish and scrambled eggs.  Continues with protein shakes at this time to help with fluid and protein intake.  She saw doctor today about Lithium and Depakene doses. Meds adjusted to revisit physician in 6 weeks to evaluate medication changes.  Encouraged patient to continue to increase fluids and work on reintroducing proteins slowly.  States understanding.

## 2016-06-02 NOTE — Progress Notes (Signed)
[facility] MD/PA/NP OP Progress Note  06/02/2016 8:47 AM NGOC DETJEN  MRN:  [id number]  Chief Complaint:  Chief Complaint    Follow-up     HPI: Pt had been on a 500 cal diet since the 7th when she had a gastric bypass. Pt will be starting solids next week. Pt has lost 17 lbs and is very happy. Since then she has been unable to take 5ml of Depakene BID and about 300mg  of Lithium. She took time off of work and has more support at home. She feels it has been good for her.   Pt denies depression. Denies anhedonia, isolation, crying spells, low motivation, poor hygiene, worthlessness and hopelessness. Denies SI/HI. States she is feeling very hopefull.   Sleep is good. Energy is on the low side but improving.   Denies manic and hypomanic symptoms including periods of decreased need for sleep, increased energy, mood lability, impulsivity, FOI, and excessive spending.  She denies anxiety. She states she is doing well.    Visit Diagnosis:    ICD-9-CM ICD-10-CM   1. Generalized anxiety disorder 300.02 F41.1 ALPRAZolam (XANAX) 0.5 MG tablet  2. Bipolar 1 disorder, depressed (HCC) 296.50 F31.9 lithium carbonate 150 MG capsule     ALPRAZolam (XANAX) 0.5 MG tablet     Valproate Sodium (DEPAKENE) 250 MG/5ML SOLN solution      Past Psychiatric History:  Anxiety: Yes Bipolar Disorder: Yes Depression: Yes Mania: Yes Psychosis: No Schizophrenia: No Personality Disorder: No Hospitalization for psychiatric illness: No History of Electroconvulsive Shock Therapy: No Prior Suicide Attempts: No  Past Medical History:  Past Medical History:  Diagnosis Date  . Anxiety   . Bipolar disorder ([place])   . Depression   . Deviated nasal septum 02/2011  . GERD (gastroesophageal reflux disease)   . Headache(784.0)    migraines  . Nasal turbinate hypertrophy 02/2011   bilat.  . Pneumonia    hx of   . PONV (postoperative nausea and vomiting)   . Seasonal allergies   . Sleep apnea    uses BIPAP nightly     Past Surgical History:  Procedure Laterality Date  . ABDOMINAL HYSTERECTOMY    . CARPAL TUNNEL RELEASE Right   . FOOT SURGERY    . GASTRIC ROUX-EN-Y N/A 05/16/2016   Procedure: LAPAROSCOPIC ROUX-EN-Y GASTRIC BYPASS WITH UPPER ENDOSCOPY;  Surgeon: Clovis Riley, MD;  Location: [facility];  Service: General;  Laterality: N/A;  . LAPAROSCOPIC VAGINAL HYSTERECTOMY  03/14/2007  . MYRINGOTOMY WITH TUBE PLACEMENT Bilateral 03/02/2015   Procedure: MYRINGOTOMY WITH T-TUBE PLACEMENT;  Surgeon: Leta Baptist, MD;  Location: [place];  Service: ENT;  Laterality: Bilateral;  . NASAL SEPTOPLASTY W/ TURBINOPLASTY  02/22/2011   Procedure: NASAL SEPTOPLASTY WITH TURBINATE REDUCTION;  Surgeon: Ascencion Dike, MD;  Location: [place];  Service: ENT;  Laterality: Bilateral;  . TUBAL LIGATION  12/16/2004    Family Psychiatric History:  Family History  Problem Relation Age of Onset  . ADD / ADHD Sister   . Alcohol abuse Maternal Grandmother   . Bipolar disorder Maternal Grandmother   . Suicidality Neg Hx     Social History:  Social History   Social History  . Marital status: Legally Separated    Spouse name: N/A  . Number of children: N/A  . Years of education: N/A   Social History Main Topics  . Smoking status: Former Smoker    Packs/day: 0.50    Years: 4.00    Types:  Cigarettes    Quit date: 11/12/2015  . Smokeless tobacco: Never Used  . Alcohol use 0.0 oz/week     Comment: once a month or less  . Drug use: No  . Sexual activity: Yes    Birth control/ protection: Surgical     Comment: Hysterectomy   Other Topics Concern  . None   Social History Narrative  . None    Allergies:  Allergies  Allergen Reactions  . Morphine And Related Itching  . Phenergan [Promethazine Hcl]     " knocks out for several hours"  . Aloe Hives  . Penicillins Rash    Has patient had a PCN reaction causing immediate rash, facial/tongue/throat swelling, SOB or lightheadedness with  hypotension: Yes Has patient had a PCN reaction causing severe rash involving mucus membranes or skin necrosis: No Has patient had a PCN reaction that required hospitalization No Has patient had a PCN reaction occurring within the last 10 years: No If all of the above answers are "NO", then may proceed with Cephalosporin use.   . Sulfa Antibiotics Rash    Metabolic Disorder Labs: No results found for: HGBA1C, MPG No results found for: PROLACTIN No results found for: CHOL, TRIG, HDL, CHOLHDL, VLDL, LDLCALC   Current Medications: Current Outpatient Prescriptions  Medication Sig Dispense Refill  . acetaminophen (TYLENOL) 500 MG tablet Take 1,000 mg by mouth every 6 (six) hours as needed (for pain/headaches).     [name] albuterol (PROVENTIL HFA;VENTOLIN HFA) 108 (90 Base) MCG/ACT inhaler Inhale 1-2 puffs into the lungs every 6 (six) hours as needed for wheezing or shortness of breath.    . ALPRAZolam (XANAX) 0.5 MG tablet Take 1 tablet (0.5 mg total) by mouth at bedtime as needed for anxiety. 30 tablet 3  . esomeprazole (NEXIUM) 40 MG capsule Take 40 mg by mouth 2 (two) times daily.  11  . hydrOXYzine (ATARAX/VISTARIL) 25 MG tablet Take 25 mg by mouth at bedtime.   3  . levocetirizine (XYZAL) 5 MG tablet Take 5 mg by mouth at bedtime.     [name] lithium carbonate 150 MG capsule Take 1 capsule (150 mg total) by mouth 3 (three) times daily with meals. 90 capsule 0  . ondansetron (ZOFRAN) 8 MG tablet Take 4-8 mg by mouth daily as needed for nausea or vomiting.     . valACYclovir (VALTREX) 500 MG tablet Take 500 mg by mouth daily as needed (FOR FLARE UPS).   3  . Valproate Sodium (DEPAKENE) 250 MG/5ML SOLN solution Take 20 mLs (1,000 mg total) by mouth 2 (two) times daily. 1200 mL 0  . zonisamide (ZONEGRAN) 25 MG capsule Take 75 mg by mouth at bedtime.   2  . azelastine (OPTIVAR) 0.05 % ophthalmic solution Place 1 drop into both eyes as needed (for itchy eyes).   3  . EPINEPHrine 0.3 mg/0.3 mL IJ SOAJ  injection Inject 0.3 mg into the muscle daily as needed (for anaphylatic reactions.).   1   No current facility-administered medications for this visit.      Musculoskeletal: Strength & Muscle Tone: within normal limits Gait & Station: normal Patient leans: N/A  Psychiatric Specialty Exam: Review of Systems  Gastrointestinal: Positive for abdominal pain and nausea. Negative for heartburn and vomiting.  Neurological: Positive for dizziness. Negative for tingling, tremors and headaches.  Psychiatric/Behavioral: Negative for depression, hallucinations, substance abuse and suicidal ideas. The patient is not nervous/anxious and does not have insomnia.     Blood pressure 124/78, pulse 69,  height 5\' 3"  (1.6 m), weight 216 lb (98 kg).Body mass index is 38.26 kg/m.  General Appearance: Casual  Eye Contact:  Good  Speech:  Clear and Coherent and Normal Rate  Volume:  Normal  Mood:  Euthymic  Affect:  Full Range  Thought Process:  Goal Directed and Descriptions of Associations: Intact  Orientation:  Full (Time, Place, and Person)  Thought Content: Logical   Suicidal Thoughts:  No  Homicidal Thoughts:  No  Memory:  Immediate;   Good Recent;   Good Remote;   Good  Judgement:  Good  Insight:  Good  Psychomotor Activity:  Normal  Concentration:  Concentration: Good and Attention Span: Good  Recall:  Good  Fund of Knowledge: Good  Language: Good  Akathisia:  No  Handed:  Right  AIMS (if indicated):  n/a  Assets:  Communication Skills Desire for [place] Talents/Skills Transportation  ADL's:  Intact  Cognition: WNL  Sleep:  good     Treatment Plan Summary:Medication management   Assessment: Bipolar I disorder-stable; GAD   Medication management with supportive therapy. Risks/benefits and SE of the medication discussed. Pt verbalized understanding and verbal consent obtained for treatment.  Affirm with the patient that the medications are  taken as ordered. Patient expressed understanding of how their medications were to be used.    Meds: Lithium 150mg  po TID for Bipolar disorder Decrease Depakene to 500mg  po BID for Bipolar disorder. Pt will titrate dose back up to 1000mg  BID in the near future Xanax 0.5mg  po qD prn anxiety. She takes it a couple of times a week   Labs: 05/18/16 Hb 11.3  Therapy: brief supportive therapy provided. Discussed psychosocial stressors in detail.     Consultations:  none  Pt denies SI and is at an acute low risk for suicide. Patient told to call clinic if any problems occur. Patient advised to go to ER if they should develop SI/HI, side effects, or if symptoms worsen. Has crisis numbers to call if needed. Pt verbalized understanding.  F/up in 6 weeks or sooner if needed    Charlcie Cradle, MD 06/02/2016, 8:47 AM

## 2016-06-07 ENCOUNTER — Telehealth: Payer: Self-pay | Admitting: Skilled Nursing Facility1

## 2016-06-07 NOTE — Telephone Encounter (Signed)
Pt called asking for more ideas to moisten/flavor meats. Pt states she has tried beef and that did not sit well.  Dietitian offered: beef/chicken/vegetable broth, mustard, vinegar, skinny girl dressings.

## 2016-06-14 ENCOUNTER — Telehealth: Payer: Self-pay | Admitting: Skilled Nursing Facility1

## 2016-06-14 NOTE — Telephone Encounter (Signed)
Pt states she has been Drinking more and not feeling as dizzy. Pt asks if it is Normal to not have an apatite. Dietitian stated it can be just make sure you are still meeting your protein and fluid needs. Pt states the More spices the better because she can't taste bland food.  Pt asked if she can eat [place] sausage: Dietitian-yes Pt states she is eating Fat free hotdogs and asked if she can have ketchup. Dietitian advised due to the sugar content she try other condiments such as mustard-pt states she does not like mustard.

## 2016-06-15 DIAGNOSIS — M791 Myalgia: Secondary | ICD-10-CM | POA: Diagnosis not present

## 2016-06-15 DIAGNOSIS — M2550 Pain in unspecified joint: Secondary | ICD-10-CM | POA: Diagnosis not present

## 2016-06-15 DIAGNOSIS — R21 Rash and other nonspecific skin eruption: Secondary | ICD-10-CM | POA: Diagnosis not present

## 2016-06-22 DIAGNOSIS — G43019 Migraine without aura, intractable, without status migrainosus: Secondary | ICD-10-CM | POA: Diagnosis not present

## 2016-06-22 DIAGNOSIS — M542 Cervicalgia: Secondary | ICD-10-CM | POA: Diagnosis not present

## 2016-06-22 DIAGNOSIS — G518 Other disorders of facial nerve: Secondary | ICD-10-CM | POA: Diagnosis not present

## 2016-06-22 DIAGNOSIS — G4482 Headache associated with sexual activity: Secondary | ICD-10-CM | POA: Diagnosis not present

## 2016-06-22 DIAGNOSIS — M791 Myalgia: Secondary | ICD-10-CM | POA: Diagnosis not present

## 2016-06-27 DIAGNOSIS — Z9884 Bariatric surgery status: Secondary | ICD-10-CM | POA: Diagnosis not present

## 2016-07-12 ENCOUNTER — Encounter: Payer: BLUE CROSS/BLUE SHIELD | Attending: Surgery | Admitting: Registered"

## 2016-07-12 ENCOUNTER — Encounter: Payer: Self-pay | Admitting: Registered"

## 2016-07-12 DIAGNOSIS — Z713 Dietary counseling and surveillance: Secondary | ICD-10-CM | POA: Diagnosis not present

## 2016-07-12 DIAGNOSIS — E669 Obesity, unspecified: Secondary | ICD-10-CM

## 2016-07-12 NOTE — Patient Instructions (Addendum)
-   Add in protein snack options such as greek yogurt, cheese, Quest protein chips.  - Try a variety of greek yogurt to find one that you enjoy. Keep fat and sugar in the single digits. Examples include: Dannon Sunday Corn and Fit, Oikos Triple Zero, Fage' yogurt.  - Continue to aim for at least 64 oz of fluid daily.  - Aim to eat about every 3 hrs with meals and snacks.  - Track meals and drinks in phone apps: Baritastic, FitBit, MyFitnessPal.

## 2016-07-12 NOTE — Progress Notes (Signed)
Follow-up visit:  8 Weeks Post-Operative RYGB Surgery  Medical Nutrition Therapy:  Appt start time: 3:20 end time:  4:11.  Primary concerns today: Post-operative Bariatric Surgery Nutrition Management.  Non scale victories: more active  Surgery date: 05/16/2016 Surgery type: RYGB Start weight at [facility]: 227.3 lbs 02/05/2016 Weight today: 197.8 lbs Weight change: 21.4 lbs from 219.2 lbs (05/31/2016) Total weight lost: 29.5 lbs Weight loss goal: less than 150 lbs, learn to eat healthier foods, be more active/present with family  TANITA  BODY COMP RESULTS  N/A 07/12/2016   BMI (kg/m^2)  35.0   Fat Mass (lbs)  87.4   Fat Free Mass (lbs)  110.4   Total Body Water (lbs)  79.4   Pt states she has felt a lot more hungrier lately. Pt states she no longer likes steak. Pt states she "can't taste bland food".      Preferred Learning Style:   No preference indicated   Learning Readiness:   Ready  Change in progress  24-hr recall: B (AM): protein shake Snk (AM): none  L (PM): 4-5 shrimp (10g) or 2oz chicken (14g) or deli ham (14g) Snk (PM): none  D (PM): protein shake Snk (PM): sugar-free popsicle  Fluid intake: 2 protein shakes, water (16.9 oz), propel (18.9 oz), 1/4 decaf coffee; 58 oz Estimated total protein intake: 70g  Medications: See list Supplementation: 1 Opurity + 2 Ca supplements  Using straws: no Drinking while eating: no Having you been chewing well: yes Chewing/swallowing difficulties: once Changes in vision: no Changes to mood/headaches: no Hair loss/Cahnges to skin/Changes to nails: no Any difficulty focusing or concentrating: no Sweating: no Dizziness/Lightheaded: dizziness initially, not anymore Palpitations: no  Carbonated beverages: no N/V/D/C/GAS: nausea when vomiting once with bacon; constipation  Abdominal Pain: no Dumping syndrome: no Last Lap-Band fill: N/A  Recent physical activity:  Walking 20 min, 5x/week  Progress Towards Goal(s):  In  progress.  Handouts given during visit include:  Phase IIIB: Protein + NS vegetables   Nutritional Diagnosis:  Inadequate fluid intake As related to bariatric surgery post-op recommendations.  As evidenced by dietary recall of less than 64 oz of fluid daily. .    Intervention:  Nutrition education and counseling. Goals: - Add in protein snack options such as greek yogurt, cheese, Quest protein chips.  - Try a variety of greek yogurt to find one that you enjoy. Keep fat and sugar in the single digits. Examples include: Dannon Sunday Corn and Fit, Oikos Triple Zero, Fage' yogurt. - Continue to aim for at least 64 oz of fluid daily. - Aim to eat about every 3 hrs with meals and snacks. - Track meals and drinks in phone apps: Baritastic, FitBit, MyFitnessPal.  Teaching Method Utilized:  Visual Auditory Hands on  Barriers to learning/adherence to lifestyle change: none  Demonstrated degree of understanding via:  Teach Back   Monitoring/Evaluation:  Dietary intake, exercise, lap band fills, and body weight. Follow up in 2 months for 4 month post-op visit.

## 2016-07-14 ENCOUNTER — Ambulatory Visit (INDEPENDENT_AMBULATORY_CARE_PROVIDER_SITE_OTHER): Payer: BLUE CROSS/BLUE SHIELD | Admitting: Psychiatry

## 2016-07-14 ENCOUNTER — Encounter (HOSPITAL_COMMUNITY): Payer: Self-pay | Admitting: Psychiatry

## 2016-07-14 DIAGNOSIS — Z87891 Personal history of nicotine dependence: Secondary | ICD-10-CM

## 2016-07-14 DIAGNOSIS — F411 Generalized anxiety disorder: Secondary | ICD-10-CM | POA: Diagnosis not present

## 2016-07-14 DIAGNOSIS — Z885 Allergy status to narcotic agent status: Secondary | ICD-10-CM | POA: Diagnosis not present

## 2016-07-14 DIAGNOSIS — F319 Bipolar disorder, unspecified: Secondary | ICD-10-CM | POA: Diagnosis not present

## 2016-07-14 DIAGNOSIS — Z888 Allergy status to other drugs, medicaments and biological substances status: Secondary | ICD-10-CM

## 2016-07-14 DIAGNOSIS — Z811 Family history of alcohol abuse and dependence: Secondary | ICD-10-CM | POA: Diagnosis not present

## 2016-07-14 DIAGNOSIS — Z882 Allergy status to sulfonamides status: Secondary | ICD-10-CM | POA: Diagnosis not present

## 2016-07-14 DIAGNOSIS — Z818 Family history of other mental and behavioral disorders: Secondary | ICD-10-CM | POA: Diagnosis not present

## 2016-07-14 DIAGNOSIS — Z880 Allergy status to penicillin: Secondary | ICD-10-CM | POA: Diagnosis not present

## 2016-07-14 DIAGNOSIS — Z79899 Other long term (current) drug therapy: Secondary | ICD-10-CM | POA: Diagnosis not present

## 2016-07-14 MED ORDER — DIVALPROEX SODIUM 500 MG PO DR TAB: 750.0000 mg | DELAYED_RELEASE_TABLET | Freq: Two times a day (BID) | ORAL | 2 refills | Status: DC

## 2016-07-14 MED ORDER — ALPRAZOLAM 0.5 MG PO TABS: 0.5000 mg | ORAL_TABLET | Freq: Every evening | ORAL | 1 refills | Status: DC | PRN

## 2016-07-14 MED ORDER — LITHIUM CARBONATE ER 450 MG PO TBCR: 450.0000 mg | EXTENDED_RELEASE_TABLET | Freq: Two times a day (BID) | ORAL | 2 refills | Status: DC

## 2016-07-14 NOTE — Progress Notes (Signed)
BH MD/PA/NP OP Progress Note  07/14/2016 3:40 PM Margaret Lopez  MRN:  [id number]  Chief Complaint:  Chief Complaint    Follow-up      HPI: Pt continues to lose weight.  She is under 200 lbs for the first time in 2 yrs. Pt feels better overall. Her energy is starting to come back and pt is busy all day.   Pt states she was feeling more irritable so she feels she needs to increase her Depakote back to her original dose.  Denies manic and hypomanic symptoms including periods of decreased need for sleep, increased energy, mood lability, impulsivity, FOI, and excessive spending.  Pt is not able to take any pills larger than a dime. As a result pt is cutting her pills in half.   She is now able to eat a little more and is working with her intuitionalist. She is still learning how to eat since the surgery and has a lot of nausea.  Sleep is good.   Pt states depression is "ok".  She does cry occasionally. Pt denies anhedonia. She denies SI/HI/AVH.   Visit Diagnosis:    ICD-10-CM   1. Bipolar 1 disorder, depressed (HCC) F31.9 ALPRAZolam (XANAX) 0.5 MG tablet    lithium carbonate (ESKALITH) 450 MG CR tablet    divalproex (DEPAKOTE) 500 MG DR tablet  2. Generalized anxiety disorder F41.1 ALPRAZolam (XANAX) 0.5 MG tablet    Past Psychiatric History:  Anxiety:Yes Bipolar Disorder:Yes Depression:Yes Mania:Yes Psychosis:No Schizophrenia:No Personality Disorder:No Hospitalization for psychiatric illness:No History of Electroconvulsive Shock Therapy:No Prior Suicide Attempts:No  Past Medical History:  Past Medical History:  Diagnosis Date  . Anxiety   . Bipolar disorder ([place])   . Depression   . Deviated nasal septum 02/2011  . GERD (gastroesophageal reflux disease)   . Headache(784.0)    migraines  . Nasal turbinate hypertrophy 02/2011   bilat.  . Pneumonia    hx of   . PONV (postoperative nausea and vomiting)   . Seasonal allergies   . Sleep apnea    uses BIPAP  nightly    Past Surgical History:  Procedure Laterality Date  . ABDOMINAL HYSTERECTOMY    . CARPAL TUNNEL RELEASE Right   . FOOT SURGERY    . GASTRIC ROUX-EN-Y N/A 05/16/2016   Procedure: LAPAROSCOPIC ROUX-EN-Y GASTRIC BYPASS WITH UPPER ENDOSCOPY;  Surgeon: Clovis Riley, MD;  Location: [facility];  Service: General;  Laterality: N/A;  . LAPAROSCOPIC VAGINAL HYSTERECTOMY  03/14/2007  . MYRINGOTOMY WITH TUBE PLACEMENT Bilateral 03/02/2015   Procedure: MYRINGOTOMY WITH T-TUBE PLACEMENT;  Surgeon: Leta Baptist, MD;  Location: [facility];  Service: ENT;  Laterality: Bilateral;  . NASAL SEPTOPLASTY W/ TURBINOPLASTY  02/22/2011   Procedure: NASAL SEPTOPLASTY WITH TURBINATE REDUCTION;  Surgeon: Ascencion Dike, MD;  Location: [facility];  Service: ENT;  Laterality: Bilateral;  . TUBAL LIGATION  12/16/2004    Family Psychiatric History:  Family History  Problem Relation Age of Onset  . ADD / ADHD Sister   . Alcohol abuse Maternal Grandmother   . Bipolar disorder Maternal Grandmother   . Suicidality Neg Hx     Social History:  Social History   Social History  . Marital status: Legally Separated    Spouse name: N/A  . Number of children: N/A  . Years of education: N/A   Social History Main Topics  . Smoking status: Former Smoker    Packs/day: 0.50    Years: 4.00  Types: Cigarettes    Quit date: 11/12/2015  . Smokeless tobacco: Never Used  . Alcohol use 0.0 oz/week     Comment: once a month or less  . Drug use: No  . Sexual activity: Yes    Birth control/ protection: Surgical     Comment: Hysterectomy   Other Topics Concern  . Not on file   Social History Narrative  . No narrative on file    Allergies:  Allergies  Allergen Reactions  . Morphine And Related Itching  . Phenergan [Promethazine Hcl]     " knocks out for several hours"  . Aloe Hives  . Penicillins Rash    Has patient had a PCN reaction causing immediate rash, facial/tongue/throat  swelling, SOB or lightheadedness with hypotension: Yes Has patient had a PCN reaction causing severe rash involving mucus membranes or skin necrosis: No Has patient had a PCN reaction that required hospitalization No Has patient had a PCN reaction occurring within the last 10 years: No If all of the above answers are "NO", then may proceed with Cephalosporin use.   . Sulfa Antibiotics Rash    Metabolic Disorder Labs: No results found for: HGBA1C, MPG No results found for: PROLACTIN No results found for: CHOL, TRIG, HDL, CHOLHDL, VLDL, LDLCALC   Current Medications: Current Outpatient Prescriptions  Medication Sig Dispense Refill  . acetaminophen (TYLENOL) 500 MG tablet Take 1,000 mg by mouth every 6 (six) hours as needed (for pain/headaches).     [name] albuterol (PROVENTIL HFA;VENTOLIN HFA) 108 (90 Base) MCG/ACT inhaler Inhale 1-2 puffs into the lungs every 6 (six) hours as needed for wheezing or shortness of breath.    . ALPRAZolam (XANAX) 0.5 MG tablet Take 1 tablet (0.5 mg total) by mouth at bedtime as needed for anxiety. 30 tablet 1  . azelastine (OPTIVAR) 0.05 % ophthalmic solution Place 1 drop into both eyes as needed (for itchy eyes).   3  . divalproex (DEPAKOTE ER) 500 MG 24 hr tablet Take by mouth daily.    [name] EPINEPHrine 0.3 mg/0.3 mL IJ SOAJ injection Inject 0.3 mg into the muscle daily as needed (for anaphylatic reactions.).   1  . esomeprazole (NEXIUM) 40 MG capsule Take 40 mg by mouth 2 (two) times daily.  11  . hydrOXYzine (ATARAX/VISTARIL) 25 MG tablet Take 25 mg by mouth at bedtime.   3  . levocetirizine (XYZAL) 5 MG tablet Take 5 mg by mouth at bedtime.     [name] lithium carbonate 150 MG capsule Take 1 capsule (150 mg total) by mouth 3 (three) times daily with meals. 90 capsule 1  . ondansetron (ZOFRAN) 8 MG tablet Take 4-8 mg by mouth daily as needed for nausea or vomiting.     . valACYclovir (VALTREX) 500 MG tablet Take 500 mg by mouth daily as needed (FOR FLARE UPS).   3  .  Valproate Sodium (DEPAKENE) 250 MG/5ML SOLN solution Take 20 mLs (1,000 mg total) by mouth 2 (two) times daily. 1200 mL 0  . zonisamide (ZONEGRAN) 25 MG capsule Take 75 mg by mouth at bedtime.   2   No current facility-administered medications for this visit.     Musculoskeletal: Strength & Muscle Tone: within normal limits Gait & Station: normal Patient leans: N/A  Psychiatric Specialty Exam: Review of Systems  Gastrointestinal: Positive for constipation and nausea. Negative for diarrhea, heartburn and vomiting.  Psychiatric/Behavioral: Positive for depression. Negative for hallucinations, substance abuse and suicidal ideas. The patient is not nervous/anxious and does  not have insomnia.     Blood pressure 126/80, pulse 60, height 5\' 3"  (1.6 m), weight 197 lb (89.4 kg), SpO2 97 %.Body mass index is 34.9 kg/m.  General Appearance: Casual  Eye Contact:  Good  Speech:  Clear and Coherent and Normal Rate  Volume:  Normal  Mood:  Depressed  Affect:  Full Range  Thought Process:  Goal Directed and Descriptions of Associations: Intact  Orientation:  Full (Time, Place, and Person)  Thought Content: Logical   Suicidal Thoughts:  No  Homicidal Thoughts:  No  Memory:  Immediate;   Good Recent;   Good Remote;   Good  Judgement:  Good  Insight:  Good  Psychomotor Activity:  Normal  Concentration:  Concentration: Good and Attention Span: Good  Recall:  Good  Fund of Knowledge: Good  Language: Good  Akathisia:  No  Handed:  Right  AIMS (if indicated):  n/a  Assets:  Communication Skills Desire for Improvement Social Support Talents/Skills Transportation  ADL's:  Intact  Cognition: WNL  Sleep:  good     Treatment Plan Summary:Medication management  Assessment: Bipolar I disorder; GAD   Medication management with supportive therapy. Risks/benefits and SE of the medication discussed. Pt verbalized understanding and verbal consent obtained for treatment.  Affirm with the patient  that the medications are taken as ordered. Patient expressed understanding of how their medications were to be used.   Meds: Lithium 450mg  po qD for Bipolar disorder Increase Depakote 750mg  po BID for Bipolar disorder Xanax 0.5mg  po qD prn anxiety   Labs: 05/18/2016 Hb 11.3  Therapy: brief supportive therapy provided. Discussed psychosocial stressors in detail.     Consultations: none  Pt denies SI and is at an acute low risk for suicide. Patient told to call clinic if any problems occur. Patient advised to go to ER if they should develop SI/HI, side effects, or if symptoms worsen. Has crisis numbers to call if needed. Pt verbalized understanding.  F/up in 3 months or sooner if needed   Charlcie Cradle, MD 07/14/2016, 3:40 PM

## 2016-08-04 ENCOUNTER — Telehealth (HOSPITAL_COMMUNITY): Payer: Self-pay

## 2016-08-04 DIAGNOSIS — F319 Bipolar disorder, unspecified: Secondary | ICD-10-CM

## 2016-08-04 NOTE — Telephone Encounter (Signed)
We can increase her Lithium to 600mg  qD and d/c Depakote. She can schedule an earlier f/up- maybe in late Aug or Sept

## 2016-08-04 NOTE — Telephone Encounter (Signed)
Patient is calling wanting to change her medications. She would like to know if there is something other than Depakote that she can take. The pills are really hard to take since her gastric bypass. Please review, and advise Thank you

## 2016-08-05 MED ORDER — LITHIUM CARBONATE ER 300 MG PO TBCR: 600.0000 mg | EXTENDED_RELEASE_TABLET | Freq: Two times a day (BID) | ORAL | 2 refills | Status: DC

## 2016-08-05 MED ORDER — LITHIUM CARBONATE ER 300 MG PO TBCR
300.0000 mg | EXTENDED_RELEASE_TABLET | Freq: Two times a day (BID) | ORAL | 2 refills | Status: DC
Start: 2016-08-05 — End: 2016-08-05

## 2016-08-05 NOTE — Telephone Encounter (Signed)
Sent a new order in for her Lithium and d/c'd Depakote. I called patient and let her know, she is agreeable to this plan

## 2016-08-10 ENCOUNTER — Ambulatory Visit: Payer: Self-pay | Admitting: Surgery

## 2016-08-22 DIAGNOSIS — Z1231 Encounter for screening mammogram for malignant neoplasm of breast: Secondary | ICD-10-CM | POA: Diagnosis not present

## 2016-08-22 DIAGNOSIS — Z6831 Body mass index (BMI) 31.0-31.9, adult: Secondary | ICD-10-CM | POA: Diagnosis not present

## 2016-08-22 DIAGNOSIS — Z01419 Encounter for gynecological examination (general) (routine) without abnormal findings: Secondary | ICD-10-CM | POA: Diagnosis not present

## 2016-08-23 NOTE — Pre-Procedure Instructions (Signed)
Margaret Lopez  08/23/2016      [facility] - [place], [place] - [address] AT [place] [address] [place] [address] Phone: [phone] Fax: [phone]  [facility] [place], [place], [place] - [place] [address] Phone: [phone] Fax: [phone]  [facility] [place], [address] AT [facility] [address] [place] [address] Phone: [phone] Fax: [phone]    Your procedure is scheduled on Friday, August 26, 2016   Report to [facility] Admitting Entrance "A" at 9:30 A.M.  Call this number if you have problems the morning of surgery:  [phone]   Remember:  Do not eat food or drink liquids after midnight on August 25, 2016  Take these medicines the morning of surgery with A SIP OF WATER: Lithium carbonate (LITHOBID). If needed Ondansetron (ZOFRAN-ODT) for nausea, Esomeprazole (NEXIUM) for heartburn, Azelastine (OPTIVAR) for itchy eyes, Acetaminophen (TYLENOL) for pain, and Albuterol Inhaler for cough or wheezing (bring with you the day of surgery).  As of today, stop taking all Aspirins, Vitamins, Fish oils, and Herbal medications. Also stop all NSAIDS i.e. Advil, Motrin, Aleve, Anaprox, Naproxen, BC and Goody Powders.   Do not wear jewelry, make-up or nail polish.  Do not wear lotions, powders, or perfumes, or deoderant.  Do not shave 48 hours prior to surgery.    Do not bring valuables to the hospital.  [facility] is not responsible for any belongings or valuables.  Contacts, dentures or bridgework may not be worn into surgery.  Leave your suitcase in the car.  After surgery it may be brought to your room.  For patients admitted to the hospital, discharge time will be determined by your treatment team.  Patients discharged the day of surgery will not be allowed to drive home.   Special  instructions:   [facility]- Preparing For Surgery  Before surgery, you can play an important role. Because skin is not sterile, your skin needs to be as free of germs as possible. You can reduce the number of germs on your skin by washing with CHG (chlorahexidine gluconate) Soap before surgery.  CHG is an antiseptic cleaner which kills germs and bonds with the skin to continue killing germs even after washing.  Please do not use if you have an allergy to CHG or antibacterial soaps. If your skin becomes reddened/irritated stop using the CHG.  Do not shave (including legs and underarms) for at least 48 hours prior to first CHG shower. It is OK to shave your face.  Please follow these instructions carefully.   1. Shower the NIGHT BEFORE SURGERY and the MORNING OF SURGERY with CHG.   2. If you chose to wash your hair, wash your hair first as usual with your normal shampoo.  3. After you shampoo, rinse your hair and body thoroughly to remove the shampoo.  4. Use CHG as you would any other liquid soap. You can apply CHG directly to the skin and wash gently with a scrungie or a clean washcloth.   5. Apply the CHG Soap to your body ONLY FROM THE NECK DOWN.  Do not use on open wounds or open sores. Avoid contact with your eyes, ears, mouth and genitals (private parts). Wash genitals (private parts) with your normal soap.  6. Wash thoroughly, paying special  attention to the area where your surgery will be performed.  7. Thoroughly rinse your body with warm water from the neck down.  8. DO NOT shower/wash with your normal soap after using and rinsing off the CHG Soap.  9. Pat yourself dry with a CLEAN TOWEL.   10. Wear CLEAN PAJAMAS   11. Place CLEAN SHEETS on your bed the night of your first shower and DO NOT SLEEP WITH PETS.  Day of Surgery: Do not apply any deodorants/lotions. Please wear clean clothes to the hospital/surgery center.    Please read over the following fact sheets that you  were given. Pain Booklet, Coughing and Deep Breathing and Surgical Site Infection Prevention

## 2016-08-24 ENCOUNTER — Encounter (HOSPITAL_COMMUNITY): Payer: Self-pay

## 2016-08-24 ENCOUNTER — Encounter (HOSPITAL_COMMUNITY)
Admission: RE | Admit: 2016-08-24 | Discharge: 2016-08-24 | Disposition: A | Discharge status: Home / Self Care | Payer: BLUE CROSS/BLUE SHIELD | Source: Ambulatory Visit | Attending: Surgery | Admitting: Surgery

## 2016-08-24 DIAGNOSIS — Z811 Family history of alcohol abuse and dependence: Secondary | ICD-10-CM | POA: Diagnosis not present

## 2016-08-24 DIAGNOSIS — Z91048 Other nonmedicinal substance allergy status: Secondary | ICD-10-CM | POA: Diagnosis not present

## 2016-08-24 DIAGNOSIS — J302 Other seasonal allergic rhinitis: Secondary | ICD-10-CM | POA: Diagnosis not present

## 2016-08-24 DIAGNOSIS — Z885 Allergy status to narcotic agent status: Secondary | ICD-10-CM | POA: Diagnosis not present

## 2016-08-24 DIAGNOSIS — F319 Bipolar disorder, unspecified: Secondary | ICD-10-CM | POA: Diagnosis not present

## 2016-08-24 DIAGNOSIS — Z79899 Other long term (current) drug therapy: Secondary | ICD-10-CM | POA: Diagnosis not present

## 2016-08-24 DIAGNOSIS — Z888 Allergy status to other drugs, medicaments and biological substances status: Secondary | ICD-10-CM | POA: Diagnosis not present

## 2016-08-24 DIAGNOSIS — Z90710 Acquired absence of both cervix and uterus: Secondary | ICD-10-CM | POA: Diagnosis not present

## 2016-08-24 DIAGNOSIS — Z9889 Other specified postprocedural states: Secondary | ICD-10-CM | POA: Diagnosis not present

## 2016-08-24 DIAGNOSIS — Z818 Family history of other mental and behavioral disorders: Secondary | ICD-10-CM | POA: Diagnosis not present

## 2016-08-24 DIAGNOSIS — Z9884 Bariatric surgery status: Secondary | ICD-10-CM | POA: Diagnosis not present

## 2016-08-24 DIAGNOSIS — Z882 Allergy status to sulfonamides status: Secondary | ICD-10-CM | POA: Diagnosis not present

## 2016-08-24 DIAGNOSIS — Z01812 Encounter for preprocedural laboratory examination: Secondary | ICD-10-CM

## 2016-08-24 DIAGNOSIS — Z880 Allergy status to penicillin: Secondary | ICD-10-CM | POA: Diagnosis not present

## 2016-08-24 DIAGNOSIS — G4730 Sleep apnea, unspecified: Secondary | ICD-10-CM | POA: Diagnosis not present

## 2016-08-24 DIAGNOSIS — K219 Gastro-esophageal reflux disease without esophagitis: Secondary | ICD-10-CM | POA: Diagnosis not present

## 2016-08-24 DIAGNOSIS — F419 Anxiety disorder, unspecified: Secondary | ICD-10-CM | POA: Diagnosis not present

## 2016-08-24 DIAGNOSIS — D1724 Benign lipomatous neoplasm of skin and subcutaneous tissue of left leg: Secondary | ICD-10-CM | POA: Diagnosis not present

## 2016-08-24 DIAGNOSIS — Z87891 Personal history of nicotine dependence: Secondary | ICD-10-CM | POA: Diagnosis not present

## 2016-08-24 DIAGNOSIS — G43909 Migraine, unspecified, not intractable, without status migrainosus: Secondary | ICD-10-CM | POA: Diagnosis not present

## 2016-08-24 HISTORY — DX: Myoneural disorder, unspecified: G70.9

## 2016-08-24 LAB — BASIC METABOLIC PANEL
ANION GAP: 8 (ref 5–15)
BUN: 8 mg/dL (ref 6–20)
CALCIUM: 9.7 mg/dL (ref 8.9–10.3)
CHLORIDE: 106 mmol/L (ref 101–111)
CO2: 22 mmol/L (ref 22–32)
Creatinine, Ser: 0.77 mg/dL (ref 0.44–1.00)
GFR calc non Af Amer: >60 mL/min (ref >60)
Glucose, Bld: 105 mg/dL — ABNORMAL HIGH (ref 65–99)
Potassium: 4.5 mmol/L (ref 3.5–5.1)
SODIUM: 136 mmol/L (ref 135–145)

## 2016-08-24 LAB — CBC
HEMATOCRIT: 37.5 % (ref 36.0–46.0)
Hemoglobin: 12 g/dL (ref 12.0–15.0)
MCH: 29.2 pg (ref 26.0–34.0)
MCHC: 32 g/dL (ref 30.0–36.0)
MCV: 91.2 fL (ref 78.0–100.0)
PLATELETS: 204 10*3/uL (ref 150–400)
RBC: 4.11 MIL/uL (ref 3.87–5.11)
RDW: 14.2 % (ref 11.5–15.5)
WBC: 5.6 10*3/uL (ref 4.0–10.5)

## 2016-08-24 NOTE — Progress Notes (Signed)
Pt. Reports that she takes Lithium midday & bedtime, due to the fact that it makes her sleepy.  Will take Lithium after surgery

## 2016-08-24 NOTE — Progress Notes (Signed)
Pt. Denies all chest concerns, remarks that she is feeling healthy at present. Pt. Revonda Standard, MD for primary care. Pt. Denies ever needing any cardiac or pulmonary specialized care.

## 2016-08-25 ENCOUNTER — Other Ambulatory Visit: Payer: Self-pay | Admitting: Cardiology

## 2016-08-25 DIAGNOSIS — R928 Other abnormal and inconclusive findings on diagnostic imaging of breast: Secondary | ICD-10-CM

## 2016-08-25 MED ORDER — VANCOMYCIN HCL IN DEXTROSE 1-5 GM/200ML-% IV SOLN
1000.0000 mg | INTRAVENOUS | Status: AC
  Administered 2016-08-26: 1000 mg via INTRAVENOUS
  Filled 2016-08-25: qty 200

## 2016-08-26 ENCOUNTER — Ambulatory Visit (HOSPITAL_COMMUNITY)
Admission: RE | Admit: 2016-08-26 | Discharge: 2016-08-26 | Disposition: A | Discharge status: Home / Self Care | Payer: BLUE CROSS/BLUE SHIELD | Source: Ambulatory Visit | Attending: Surgery | Admitting: Surgery

## 2016-08-26 ENCOUNTER — Ambulatory Visit (HOSPITAL_COMMUNITY): Payer: BLUE CROSS/BLUE SHIELD | Admitting: Certified Registered"

## 2016-08-26 ENCOUNTER — Encounter (HOSPITAL_COMMUNITY)
Admission: RE | Disposition: A | Discharge status: Home / Self Care | Payer: Self-pay | Source: Ambulatory Visit | Attending: Surgery

## 2016-08-26 ENCOUNTER — Encounter (HOSPITAL_COMMUNITY): Payer: Self-pay | Admitting: *Deleted

## 2016-08-26 ENCOUNTER — Other Ambulatory Visit: Payer: Self-pay | Admitting: Obstetrics and Gynecology

## 2016-08-26 DIAGNOSIS — Z885 Allergy status to narcotic agent status: Secondary | ICD-10-CM | POA: Diagnosis not present

## 2016-08-26 DIAGNOSIS — D1724 Benign lipomatous neoplasm of skin and subcutaneous tissue of left leg: Secondary | ICD-10-CM | POA: Insufficient documentation

## 2016-08-26 DIAGNOSIS — F411 Generalized anxiety disorder: Secondary | ICD-10-CM | POA: Diagnosis not present

## 2016-08-26 DIAGNOSIS — Z882 Allergy status to sulfonamides status: Secondary | ICD-10-CM | POA: Diagnosis not present

## 2016-08-26 DIAGNOSIS — Z880 Allergy status to penicillin: Secondary | ICD-10-CM | POA: Insufficient documentation

## 2016-08-26 DIAGNOSIS — Z90710 Acquired absence of both cervix and uterus: Secondary | ICD-10-CM | POA: Insufficient documentation

## 2016-08-26 DIAGNOSIS — Z888 Allergy status to other drugs, medicaments and biological substances status: Secondary | ICD-10-CM | POA: Insufficient documentation

## 2016-08-26 DIAGNOSIS — Z87891 Personal history of nicotine dependence: Secondary | ICD-10-CM | POA: Diagnosis not present

## 2016-08-26 DIAGNOSIS — Z811 Family history of alcohol abuse and dependence: Secondary | ICD-10-CM | POA: Insufficient documentation

## 2016-08-26 DIAGNOSIS — Z9884 Bariatric surgery status: Secondary | ICD-10-CM | POA: Diagnosis not present

## 2016-08-26 DIAGNOSIS — Z9889 Other specified postprocedural states: Secondary | ICD-10-CM | POA: Insufficient documentation

## 2016-08-26 DIAGNOSIS — G4730 Sleep apnea, unspecified: Secondary | ICD-10-CM | POA: Insufficient documentation

## 2016-08-26 DIAGNOSIS — Z79899 Other long term (current) drug therapy: Secondary | ICD-10-CM | POA: Diagnosis not present

## 2016-08-26 DIAGNOSIS — Z91048 Other nonmedicinal substance allergy status: Secondary | ICD-10-CM | POA: Insufficient documentation

## 2016-08-26 DIAGNOSIS — R928 Other abnormal and inconclusive findings on diagnostic imaging of breast: Secondary | ICD-10-CM

## 2016-08-26 DIAGNOSIS — G43909 Migraine, unspecified, not intractable, without status migrainosus: Secondary | ICD-10-CM | POA: Insufficient documentation

## 2016-08-26 DIAGNOSIS — F319 Bipolar disorder, unspecified: Secondary | ICD-10-CM | POA: Diagnosis not present

## 2016-08-26 DIAGNOSIS — Z818 Family history of other mental and behavioral disorders: Secondary | ICD-10-CM | POA: Insufficient documentation

## 2016-08-26 DIAGNOSIS — K219 Gastro-esophageal reflux disease without esophagitis: Secondary | ICD-10-CM | POA: Insufficient documentation

## 2016-08-26 DIAGNOSIS — F419 Anxiety disorder, unspecified: Secondary | ICD-10-CM | POA: Insufficient documentation

## 2016-08-26 DIAGNOSIS — J302 Other seasonal allergic rhinitis: Secondary | ICD-10-CM | POA: Insufficient documentation

## 2016-08-26 HISTORY — PX: LIPOMA EXCISION: SHX5283

## 2016-08-26 SURGERY — EXCISION LIPOMA
Anesthesia: Monitor Anesthesia Care | Site: Leg Upper | Laterality: Left

## 2016-08-26 MED ORDER — PROPOFOL 500 MG/50ML IV EMUL
INTRAVENOUS | Status: DC | PRN
  Administered 2016-08-26: 75 ug/kg/min via INTRAVENOUS

## 2016-08-26 MED ORDER — PROPOFOL 10 MG/ML IV BOLUS
INTRAVENOUS | Status: DC | PRN
  Administered 2016-08-26: 30 mg via INTRAVENOUS

## 2016-08-26 MED ORDER — CHLORHEXIDINE GLUCONATE 4 % EX LIQD: 60.0000 mL | Freq: Once | CUTANEOUS | Status: DC

## 2016-08-26 MED ORDER — HYDROCODONE-ACETAMINOPHEN 7.5-325 MG/15ML PO SOLN: 10.0000 mL | Freq: Four times a day (QID) | ORAL | 0 refills | Status: DC | PRN

## 2016-08-26 MED ORDER — FENTANYL CITRATE (PF) 250 MCG/5ML IJ SOLN
INTRAMUSCULAR | Status: AC
  Filled 2016-08-26: qty 5

## 2016-08-26 MED ORDER — MIDAZOLAM HCL 5 MG/5ML IJ SOLN
INTRAMUSCULAR | Status: DC | PRN
  Administered 2016-08-26: 2 mg via INTRAVENOUS

## 2016-08-26 MED ORDER — BUPIVACAINE-EPINEPHRINE 0.25% -1:200000 IJ SOLN
INTRAMUSCULAR | Status: DC | PRN
  Administered 2016-08-26: 15 mL

## 2016-08-26 MED ORDER — ONDANSETRON HCL 4 MG/2ML IJ SOLN
INTRAMUSCULAR | Status: DC | PRN
  Administered 2016-08-26: 4 mg via INTRAVENOUS

## 2016-08-26 MED ORDER — PROPOFOL 10 MG/ML IV BOLUS
INTRAVENOUS | Status: AC
  Filled 2016-08-26: qty 20

## 2016-08-26 MED ORDER — ONDANSETRON HCL 4 MG/2ML IJ SOLN
INTRAMUSCULAR | Status: AC
  Filled 2016-08-26: qty 2

## 2016-08-26 MED ORDER — SCOPOLAMINE 1 MG/3DAYS TD PT72
MEDICATED_PATCH | TRANSDERMAL | Status: AC
  Filled 2016-08-26: qty 1

## 2016-08-26 MED ORDER — SCOPOLAMINE 1 MG/3DAYS TD PT72
MEDICATED_PATCH | TRANSDERMAL | Status: DC | PRN
  Administered 2016-08-26: 1 via TRANSDERMAL

## 2016-08-26 MED ORDER — 0.9 % SODIUM CHLORIDE (POUR BTL) OPTIME
TOPICAL | Status: DC | PRN
  Administered 2016-08-26: 1000 mL

## 2016-08-26 MED ORDER — FENTANYL CITRATE (PF) 250 MCG/5ML IJ SOLN
INTRAMUSCULAR | Status: DC | PRN
  Administered 2016-08-26 (×3): 50 ug via INTRAVENOUS

## 2016-08-26 MED ORDER — MIDAZOLAM HCL 2 MG/2ML IJ SOLN
INTRAMUSCULAR | Status: AC
  Filled 2016-08-26: qty 2

## 2016-08-26 MED ORDER — LIDOCAINE 2% (20 MG/ML) 5 ML SYRINGE
INTRAMUSCULAR | Status: DC | PRN
  Administered 2016-08-26: 40 mg via INTRAVENOUS

## 2016-08-26 MED ORDER — PROPOFOL 1000 MG/100ML IV EMUL
INTRAVENOUS | Status: AC
  Filled 2016-08-26: qty 100

## 2016-08-26 MED ORDER — LACTATED RINGERS IV SOLN
INTRAVENOUS | Status: DC
  Administered 2016-08-26 (×2): via INTRAVENOUS

## 2016-08-26 MED ORDER — LIDOCAINE 2% (20 MG/ML) 5 ML SYRINGE
INTRAMUSCULAR | Status: AC
  Filled 2016-08-26: qty 5

## 2016-08-26 MED ORDER — BUPIVACAINE-EPINEPHRINE (PF) 0.25% -1:200000 IJ SOLN
INTRAMUSCULAR | Status: AC
  Filled 2016-08-26: qty 30

## 2016-08-26 SURGICAL SUPPLY — 37 items
ADH SKN CLS APL DERMABOND .7 (GAUZE/BANDAGES/DRESSINGS) ×1
CANISTER SUCT 3000ML PPV (MISCELLANEOUS) IMPLANT
COVER SURGICAL LIGHT HANDLE (MISCELLANEOUS) ×2 IMPLANT
DECANTER SPIKE VIAL GLASS SM (MISCELLANEOUS) IMPLANT
DERMABOND ADVANCED (GAUZE/BANDAGES/DRESSINGS) ×1
DERMABOND ADVANCED .7 DNX12 (GAUZE/BANDAGES/DRESSINGS) ×1 IMPLANT
DRAPE LAPAROSCOPIC ABDOMINAL (DRAPES) IMPLANT
DRAPE LAPAROTOMY 100X72 PEDS (DRAPES) IMPLANT
DRAPE UTILITY XL STRL (DRAPES) ×2 IMPLANT
ELECT CAUTERY BLADE 6.4 (BLADE) ×2 IMPLANT
ELECT REM PT RETURN 9FT ADLT (ELECTROSURGICAL) ×2
ELECTRODE REM PT RTRN 9FT ADLT (ELECTROSURGICAL) ×1 IMPLANT
GAUZE SPONGE 4X4 12PLY STRL (GAUZE/BANDAGES/DRESSINGS) IMPLANT
GLOVE BIO SURGEON STRL SZ 6 (GLOVE) ×2 IMPLANT
GLOVE BIOGEL PI IND STRL 6.5 (GLOVE) ×1 IMPLANT
GLOVE BIOGEL PI INDICATOR 6.5 (GLOVE) ×1
GOWN STRL REUS W/ TWL LRG LVL3 (GOWN DISPOSABLE) ×1 IMPLANT
GOWN STRL REUS W/TWL LRG LVL3 (GOWN DISPOSABLE) ×2
KIT BASIN OR (CUSTOM PROCEDURE TRAY) ×2 IMPLANT
KIT ROOM TURNOVER OR (KITS) ×2 IMPLANT
NDL HYPO 25GX1X1/2 BEV (NEEDLE) ×1 IMPLANT
NEEDLE HYPO 25GX1X1/2 BEV (NEEDLE) ×2 IMPLANT
NS IRRIG 1000ML POUR BTL (IV SOLUTION) ×2 IMPLANT
PACK SURGICAL SETUP 50X90 (CUSTOM PROCEDURE TRAY) ×2 IMPLANT
PAD ARMBOARD 7.5X6 YLW CONV (MISCELLANEOUS) ×2 IMPLANT
PENCIL BUTTON HOLSTER BLD 10FT (ELECTRODE) ×2 IMPLANT
SPECIMEN JAR MEDIUM (MISCELLANEOUS) ×2 IMPLANT
SPONGE LAP 18X18 X RAY DECT (DISPOSABLE) ×2 IMPLANT
SUT MNCRL AB 4-0 PS2 18 (SUTURE) ×2 IMPLANT
SUT VIC AB 3-0 SH 27 (SUTURE) ×2
SUT VIC AB 3-0 SH 27XBRD (SUTURE) ×1 IMPLANT
SYR BULB 3OZ (MISCELLANEOUS) ×2 IMPLANT
SYR CONTROL 10ML LL (SYRINGE) ×2 IMPLANT
TOWEL OR 17X24 6PK STRL BLUE (TOWEL DISPOSABLE) ×2 IMPLANT
TOWEL OR 17X26 10 PK STRL BLUE (TOWEL DISPOSABLE) ×2 IMPLANT
TUBE CONNECTING 12X1/4 (SUCTIONS) IMPLANT
YANKAUER SUCT BULB TIP NO VENT (SUCTIONS) IMPLANT

## 2016-08-26 NOTE — H&P (Signed)
Surgical H&P  CC: lipoma  HPI: Symptomatic lipoma left upper posterior thigh, desires excision  Allergies  Allergen Reactions  . Doxazosin Nausea And Vomiting  . Morphine And Related Itching  . Phenergan [Promethazine Hcl]     " knocks out for several hours"  . Aloe Hives  . Penicillins Rash    Has patient had a PCN reaction causing immediate rash, facial/tongue/throat swelling, SOB or lightheadedness with hypotension: Yes Has patient had a PCN reaction causing severe rash involving mucus membranes or skin necrosis: No Has patient had a PCN reaction that required hospitalization No Has patient had a PCN reaction occurring within the last 10 years: No If all of the above answers are "NO", then may proceed with Cephalosporin use.   . Sulfa Antibiotics Rash    Past Medical History:  Diagnosis Date  . Anxiety   . Bipolar disorder ([place])   . Depression   . Deviated nasal septum 02/2011  . GERD (gastroesophageal reflux disease)   . Headache(784.0)    migraines  . Nasal turbinate hypertrophy 02/2011   bilat.  . Neuromuscular disorder [facility])    Dr. Jerilynn Mages. Domingo Cocking- does injections around nerve in her head for prevention of migraines - q 3 months   . Pneumonia    hx of   . PONV (postoperative nausea and vomiting)   . Seasonal allergies   . Sleep apnea    uses BIPAP nightly    Past Surgical History:  Procedure Laterality Date  . ABDOMINAL HYSTERECTOMY    . CARPAL TUNNEL RELEASE Right   . DIAGNOSTIC LAPAROSCOPY    . FOOT SURGERY Right    bone removed from great toe   . GASTRIC ROUX-EN-Y N/A 05/16/2016   Procedure: LAPAROSCOPIC ROUX-EN-Y GASTRIC BYPASS WITH UPPER ENDOSCOPY;  Surgeon: Clovis Riley, MD;  [facility];  Service: General;  Laterality: N/A;  . LAPAROSCOPIC VAGINAL HYSTERECTOMY  03/14/2007  . MYRINGOTOMY WITH TUBE PLACEMENT Bilateral 03/02/2015   Procedure: MYRINGOTOMY WITH T-TUBE PLACEMENT;  Surgeon: Leta Baptist, MD;  Location: [facility];  Service: ENT;   Laterality: Bilateral;  . NASAL SEPTOPLASTY W/ TURBINOPLASTY  02/22/2011   Procedure: NASAL SEPTOPLASTY WITH TURBINATE REDUCTION;  Surgeon: Ascencion Dike, MD;  Location: [facility];  Service: ENT;  Laterality: Bilateral;  . TUBAL LIGATION  12/16/2004    Family History  Problem Relation Age of Onset  . ADD / ADHD Sister   . Alcohol abuse Maternal Grandmother   . Bipolar disorder Maternal Grandmother   . Suicidality Neg Hx     Social History   Social History  . Marital status: Legally Separated    Spouse name: N/A  . Number of children: N/A  . Years of education: N/A   Social History Main Topics  . Smoking status: Former Smoker    Packs/day: 0.50    Years: 4.00    Types: Cigarettes    Quit date: 11/12/2015  . Smokeless tobacco: Never Used  . Alcohol use No     Comment: once a month or less  . Drug use: No  . Sexual activity: Yes    Partners: Male    Birth control/ protection: Surgical     Comment: Hysterectomy   Other Topics Concern  . None   Social History Narrative  . None    No current facility-administered medications on file prior to encounter.    Current Outpatient Prescriptions on File Prior to Encounter  Medication Sig Dispense Refill  . acetaminophen (  TYLENOL) 500 MG tablet Take 1,000 mg by mouth every 6 (six) hours as needed (for pain/headaches).     [name] azelastine (OPTIVAR) 0.05 % ophthalmic solution Place 1 drop into both eyes as needed (for itchy eyes).   3  . EPINEPHrine 0.3 mg/0.3 mL IJ SOAJ injection Inject 0.3 mg into the muscle daily as needed (for anaphylatic reactions.).   1  . hydrOXYzine (ATARAX/VISTARIL) 25 MG tablet Take 25 mg by mouth at bedtime as needed for itching.   3  . levocetirizine (XYZAL) 5 MG tablet Take 5 mg by mouth at bedtime.     [name] lithium carbonate (LITHOBID) 300 MG CR tablet Take 2 tablets (600 mg total) by mouth 2 (two) times daily. (Patient taking differently: Take 300 mg by mouth 2 (two) times daily. ) 120 tablet 2   . zonisamide (ZONEGRAN) 25 MG capsule Take 75 mg by mouth at bedtime.   2  . albuterol (PROVENTIL HFA;VENTOLIN HFA) 108 (90 Base) MCG/ACT inhaler Inhale 1-2 puffs into the lungs every 6 (six) hours as needed for wheezing or shortness of breath.    . ALPRAZolam (XANAX) 0.5 MG tablet Take 1 tablet (0.5 mg total) by mouth at bedtime as needed for anxiety. 30 tablet 1  . divalproex (DEPAKOTE) 500 MG DR tablet Take 2 tablets (1,000 mg total) by mouth 2 (two) times daily. (Patient not taking: Reported on 08/22/2016) 90 tablet 2  . ondansetron (ZOFRAN) 8 MG tablet Take 4-8 mg by mouth daily as needed for nausea or vomiting.     . valACYclovir (VALTREX) 500 MG tablet Take 500 mg by mouth daily as needed (FOR FLARE UPS).   3    Review of Systems: a complete, 10pt review of systems was completed with pertinent positives and negatives as documented in the HPI.   Physical Exam: Vitals:   08/26/16 0949  BP: 121/66  Pulse: 67  Resp: 18  Temp: 97.7 F (36.5 C)  SpO2: 98%   Gen: A&Ox3, no distress  Head: normocephalic, atraumatic, EOMI, anicteric.  Chest: unlabored respirations  Cardiovascular: RRR with palpable distal pulses Abdomen: soft, nontender Extremities: warm, without edema, no deformities Neuro: grossly intact Psych: appropriate mood and affect  Skin: left posterior thigh lipoma with some redundant overlying skin  CBC Latest Ref Rng & Units 08/24/2016 05/18/2016 05/17/2016  WBC 4.0 - 10.5 K/uL 5.6 10.6(H) 10.7(H)  Hemoglobin 12.0 - 15.0 g/dL 12.0 11.3(L) 11.6(L)  Hematocrit 36.0 - 46.0 % 37.5 35.7(L) 34.9(L)  Platelets 150 - 400 K/uL 204 245 222    CMP Latest Ref Rng & Units 08/24/2016 05/17/2016 05/10/2016  Glucose 65 - 99 mg/dL 105(H) 116(H) 101(H)  BUN 6 - 20 mg/dL 8 10 15   Creatinine 0.44 - 1.00 mg/dL 0.77 0.59 0.68  Sodium 135 - 145 mmol/L 136 140 137  Potassium 3.5 - 5.1 mmol/L 4.5 3.8 4.2  Chloride 101 - 111 mmol/L 106 106 104  CO2 22 - 32 mmol/L 22 23 25   Calcium 8.9 - 10.3  mg/dL 9.7 8.6(L) 9.3  Total Protein 6.5 - 8.1 g/dL - - 7.1  Total Bilirubin 0.3 - 1.2 mg/dL - - 0.2(L)  Alkaline Phos 38 - 126 U/L - - 46  AST 15 - 41 U/L - - 13(L)  ALT 14 - 54 U/L - - 8(L)    No results found for: INR, PROTIME  Imaging: n/a  A/P: To OR for excision of lipoma from left upper thigh. Home postop.   Romana Juniper, MD [facility] [place] Pager [phone]

## 2016-08-26 NOTE — Anesthesia Preprocedure Evaluation (Signed)
Anesthesia Evaluation  Patient identified by MRN, date of birth, ID band Patient awake    Reviewed: Allergy & Precautions, NPO status , Patient's Chart, lab work & pertinent test results  History of Anesthesia Complications (+) PONV and history of anesthetic complications  Airway Mallampati: III  TM Distance: >3 FB Neck ROM: Full    Dental  (+) Teeth Intact, Dental Advisory Given   Pulmonary sleep apnea and Continuous Positive Airway Pressure Ventilation , former smoker,    Pulmonary exam normal breath sounds clear to auscultation       Cardiovascular Exercise Tolerance: Good negative cardio ROS Normal cardiovascular exam Rhythm:Regular Rate:Normal     Neuro/Psych  Headaches, PSYCHIATRIC DISORDERS Anxiety Depression Bipolar Disorder    GI/Hepatic Neg liver ROS, GERD  Medicated,  Endo/Other    Renal/GU negative Renal ROS     Musculoskeletal negative musculoskeletal ROS (+)   Abdominal   Peds  Hematology negative hematology ROS (+)   Anesthesia Other Findings Day of surgery medications reviewed with the patient.  Reproductive/Obstetrics                             Anesthesia Physical  Anesthesia Plan  ASA: II  Anesthesia Plan: MAC   Post-op Pain Management:    Induction: Intravenous  PONV Risk Score and Plan: 2 and Ondansetron and Midazolam  Airway Management Planned: Simple Face Mask  Additional Equipment:   Intra-op Plan:   Post-operative Plan:   Informed Consent: I have reviewed the patients History and Physical, chart, labs and discussed the procedure including the risks, benefits and alternatives for the proposed anesthesia with the patient or authorized representative who has indicated his/her understanding and acceptance.   Dental advisory given  Plan Discussed with: CRNA  Anesthesia Plan Comments:         Anesthesia Quick Evaluation

## 2016-08-26 NOTE — Progress Notes (Signed)
Allergy noted to Aloe CHG not completed.

## 2016-08-26 NOTE — Anesthesia Postprocedure Evaluation (Signed)
Anesthesia Post Note  Patient: Margaret Lopez  Procedure(s) Performed: Procedure(s) (LRB): EXCISION LIPOMA FROM LEFT UPPER POSTERIOR THIGH (Left)     Patient location during evaluation: PACU Anesthesia Type: MAC Level of consciousness: awake and alert Pain management: pain level controlled Vital Signs Assessment: post-procedure vital signs reviewed and stable Respiratory status: spontaneous breathing, nonlabored ventilation and respiratory function stable Cardiovascular status: stable and blood pressure returned to baseline Anesthetic complications: no    Last Vitals:  Vitals:   08/26/16 1245 08/26/16 1300  BP: 110/63 (!) 105/58  Pulse: (!) 55 61  Resp: 14 17  Temp:    SpO2: 99% 98%    Last Pain:  Vitals:   08/26/16 0949  TempSrc: Oral                 Lynda Rainwater

## 2016-08-26 NOTE — Op Note (Signed)
Operative Note  Margaret Lopez  [id number]  [id number]  08/26/2016   Surgeon: Clovis Riley  Assistant: OR staff  Procedure performed: excision of 5x7.5cm lipoma from left upper posterior thigh  Preop diagnosis: lipoma Post-op diagnosis/intraop findings: same  Specimens: lipoma Retained items: none EBL: minimal cc Complications: none  Description of procedure: After obtaining informed consent the patient was taken to the operating room and placed prone on operating room table [facility] was initiated, preoperative antibiotics were administered, SCDs applied, and a formal timeout was performed. The posterior superior left thigh was prepped and draped in the usual sterile fashion. After infiltration with local, an elliptical incision was made around the redundant skin overlying the palpable lipoma. Cautery and blunt dissection were used to carry the dissection down through the dermis and around the well-demarcated lipoma. The lipoma was delivered into the field and small feeding vessels divided with cautery. Once excised the lipoma was sent off for pathology. Hemostasis was ensured within the wound with cautery. The skin was closed with interrupted deep dermal 3-0 vicryls, running subcuticular monocryl and dermabond. The patient was then awakened and taken to PACU in stable condition.   All counts were correct at the completion of the case.

## 2016-08-26 NOTE — Transfer of Care (Signed)
Immediate Anesthesia Transfer of Care Note  Patient: Margaret Lopez  Procedure(s) Performed: Procedure(s): EXCISION LIPOMA FROM LEFT UPPER POSTERIOR THIGH (Left)  Patient Location: PACU  Anesthesia Type:MAC  Level of Consciousness: drowsy and patient cooperative  Airway & Oxygen Therapy: Patient Spontanous Breathing and Patient connected to face mask oxygen  Post-op Assessment: Report given to RN and Post -op Vital signs reviewed and stable  Post vital signs: Reviewed and stable  Last Vitals:  Vitals:   08/26/16 0949 08/26/16 1224  BP: 121/66 107/68  Pulse: 67 70  Resp: 18   Temp: 36.5 C 37.3 C  SpO2: 98% 100%    Last Pain:  Vitals:   08/26/16 0949  TempSrc: Oral         Complications: No apparent anesthesia complications

## 2016-08-26 NOTE — Discharge Instructions (Signed)
[place] Office Phone Number [phone]  POST OP INSTRUCTIONS  Always review your discharge instruction sheet given to you by the facility where your surgery was performed.  IF YOU HAVE DISABILITY OR FAMILY LEAVE FORMS, YOU MUST BRING THEM TO THE OFFICE FOR PROCESSING.  DO NOT GIVE THEM TO YOUR DOCTOR.  1. A prescription for pain medication may be given to you upon discharge.  Take your pain medication as prescribed, if needed.  If narcotic pain medicine is not needed, then you may take acetaminophen (Tylenol) or ibuprofen (Advil) as needed. 2. Take your usually prescribed medications unless otherwise directed 3. If you need a refill on your pain medication, please contact your pharmacy.  They will contact our office to request authorization.  Prescriptions will not be filled after 5pm or on week-ends. 4. You should eat very light the first 24 hours after surgery, such as soup, crackers, pudding, etc.  Resume your normal diet the day after surgery. 5. Most patients will experience some swelling and bruising in the surgical site.  Ice packs and reclining will help.  Swelling and bruising can take several days to resolve.  6. It is common to experience some constipation if taking pain medication after surgery.  Increasing fluid intake and taking a stool softener will usually help or prevent this problem from occurring.  A mild laxative (Milk of Magnesia or Miralax) should be taken according to package directions if there are no bowel movements after 48 hours. 7. Unless discharge instructions indicate otherwise, you may remove your bandages 24-48 hours after surgery, and you may shower at that time.  You may have steri-strips (small skin tapes) in place directly over the incision.  These strips should be left on the skin for 7-10 days.  If your surgeon used skin glue on the incision, you may shower in 24 hours.  The glue will flake off over the next 2-3 weeks.  Any sutures or staples  will be removed at the office during your follow-up visit. 8. ACTIVITIES:  You may resume regular daily activities (gradually increasing) beginning the next day.   You may have sexual intercourse when it is comfortable. a. You may drive when you no longer are taking prescription pain medication, you can comfortably wear a seatbelt, and you can safely maneuver your car and apply brakes. b. RETURN TO WORK:  _1 week_______________________________________________________________________________ 9. You should see your doctor in the office for a follow-up appointment approximately three weeks after your surgery.      WHEN TO CALL YOUR DOCTOR: 1. Fever over 101.0 2. Nausea and/or vomiting. 3. Extreme swelling or bruising. 4. Continued bleeding from incision. 5. Increased pain, redness, or drainage from the incision.  The clinic staff is available to answer your questions during regular business hours.  Please dont hesitate to call and ask to speak to one of the nurses for clinical concerns.  If you have a medical emergency, go to the nearest emergency room or call 911.  A surgeon from [facility] is always on call at the hospital.  For further questions, please visit [email or web address]

## 2016-08-29 ENCOUNTER — Ambulatory Visit
Admission: RE | Admit: 2016-08-29 | Discharge: 2016-08-29 | Disposition: A | Discharge status: Home / Self Care | Payer: BLUE CROSS/BLUE SHIELD | Source: Ambulatory Visit | Attending: Cardiology | Admitting: Cardiology

## 2016-08-29 ENCOUNTER — Encounter (HOSPITAL_COMMUNITY): Payer: Self-pay | Admitting: Surgery

## 2016-08-29 DIAGNOSIS — R922 Inconclusive mammogram: Secondary | ICD-10-CM | POA: Diagnosis not present

## 2016-08-29 DIAGNOSIS — N6489 Other specified disorders of breast: Secondary | ICD-10-CM | POA: Diagnosis not present

## 2016-08-29 DIAGNOSIS — R928 Other abnormal and inconclusive findings on diagnostic imaging of breast: Secondary | ICD-10-CM

## 2016-09-06 DIAGNOSIS — G4700 Insomnia, unspecified: Secondary | ICD-10-CM | POA: Diagnosis not present

## 2016-09-06 DIAGNOSIS — G4733 Obstructive sleep apnea (adult) (pediatric): Secondary | ICD-10-CM | POA: Diagnosis not present

## 2016-09-13 ENCOUNTER — Ambulatory Visit: Payer: Self-pay | Admitting: Registered"

## 2016-09-14 ENCOUNTER — Encounter: Payer: BLUE CROSS/BLUE SHIELD | Attending: Surgery | Admitting: Skilled Nursing Facility1

## 2016-09-14 ENCOUNTER — Encounter: Payer: Self-pay | Admitting: Skilled Nursing Facility1

## 2016-09-14 DIAGNOSIS — Z713 Dietary counseling and surveillance: Secondary | ICD-10-CM | POA: Insufficient documentation

## 2016-09-14 NOTE — Patient Instructions (Addendum)
-  Set alarms to remind yourself to eat every 3 hours   -Keep some fluid on you at all times and sip throughout the day: aim for 2 of your bottles    -Try strawberries once in a day  -Keep up your walking!

## 2016-09-14 NOTE — Progress Notes (Signed)
Follow-up visit:  8 Weeks Post-Operative RYGB Surgery  Medical Nutrition Therapy:  Appt start time: 3:20 end time:  4:11.  Primary concerns today: Post-operative Bariatric Surgery Nutrition Management.  Non scale victories: more active   Pt states she has been having nausea due to not eating often enough throughout the day due to being busy. Pt states she Wants something sweet so she tried sugar free ice cream which made her sick and cheesecake which made her sick. Pt states Salad dressings making her but she made her own which did not. sick= nausea thinking maybe she is eating too much or going too long. Pt states once she gets sick from not eating it lasts about 3 days.  Surgery date: 05/16/2016 Surgery type: RYGB Start weight at [facility]: 227.3 lbs 02/05/2016 Weight today: 197.8 lbs Weight change: 20.4 Weight loss goal: less than 150 lbs, learn to eat healthier foods, be more active/present with family  TANITA  BODY COMP RESULTS  N/A 07/12/2016 09/14/2016   BMI (kg/m^2)  35.0 31.4   Fat Mass (lbs)  87.4 70.4   Fat Free Mass (lbs)  110.4 107   Total Body Water (lbs)  79.4 76   Pt states she has felt a lot more hungrier lately. Pt states she no longer likes steak. Pt states she "can't taste bland food".      Preferred Learning Style:   No preference indicated   Learning Readiness:   Ready  Change in progress  24-hr recall: going long period of times without eating B (AM): protein shake (30g) Snk (AM): none  L (PM): salad with deli ham (14g)  Snk (PM): an hour after lunch manwich slider or ham sandwich with the bread 4pm: yogurt (4g) D (PM): chicken or manwich or steak fajita with tortilla with peppers and onions Snk (PM): sugar-free popsicle  Fluid intake: 1 protein shakes, water with sugar free coolaid (16.9 oz), propel (18.9 oz), 14 ounces coffee with sweet n low and non dairy creamer: 34.9 fluid ounces  Estimated total protein intake: 70g  Medications: See  list Supplementation: 1 Opurity + 2 Ca supplements  Using straws: no Drinking while eating: no Having you been chewing well: yes Chewing/swallowing difficulties: once Changes in vision: no Changes to mood/headaches: no Hair loss/Changes to skin/Changes to nails: no Any difficulty focusing or concentrating: no Sweating: no Dizziness/Lightheaded: no Palpitations: no  Carbonated beverages: no N/V/D/C/GAS: nausea when not eating  Abdominal Pain: no Dumping syndrome: no  Recent physical activity:  Walking 20-30 min, 6x/week  Progress Towards Goal(s):  In progress.   Nutritional Diagnosis:  Inadequate fluid intake As related to bariatric surgery post-op recommendations.  As evidenced by dietary recall of less than 64 oz of fluid daily. .    Intervention:  Nutrition education and counseling. Goals: -Set alarms to remind yourself to eat every 3 hours -Keep some fluid on you at all times and sip throughout the day: aim for 2 of your bottles  -Try strawberries once in a day -Keep up your walking! Teaching Method Utilized:  Visual Auditory Hands on  Barriers to learning/adherence to lifestyle change: none  Demonstrated degree of understanding via:  Teach Back   Monitoring/Evaluation:  Dietary intake, exercise, and body weight. Follow up in 2 months for 4 month post-op visit.

## 2016-09-15 ENCOUNTER — Ambulatory Visit (INDEPENDENT_AMBULATORY_CARE_PROVIDER_SITE_OTHER): Payer: BLUE CROSS/BLUE SHIELD | Admitting: Psychiatry

## 2016-09-15 ENCOUNTER — Encounter (HOSPITAL_COMMUNITY): Payer: Self-pay | Admitting: Psychiatry

## 2016-09-15 DIAGNOSIS — G4733 Obstructive sleep apnea (adult) (pediatric): Secondary | ICD-10-CM | POA: Diagnosis not present

## 2016-09-15 DIAGNOSIS — Z818 Family history of other mental and behavioral disorders: Secondary | ICD-10-CM

## 2016-09-15 DIAGNOSIS — R110 Nausea: Secondary | ICD-10-CM

## 2016-09-15 DIAGNOSIS — F319 Bipolar disorder, unspecified: Secondary | ICD-10-CM

## 2016-09-15 DIAGNOSIS — Z87891 Personal history of nicotine dependence: Secondary | ICD-10-CM

## 2016-09-15 DIAGNOSIS — F419 Anxiety disorder, unspecified: Secondary | ICD-10-CM | POA: Diagnosis not present

## 2016-09-15 DIAGNOSIS — F411 Generalized anxiety disorder: Secondary | ICD-10-CM | POA: Diagnosis not present

## 2016-09-15 DIAGNOSIS — Z811 Family history of alcohol abuse and dependence: Secondary | ICD-10-CM | POA: Diagnosis not present

## 2016-09-15 DIAGNOSIS — Z713 Dietary counseling and surveillance: Secondary | ICD-10-CM | POA: Diagnosis not present

## 2016-09-15 DIAGNOSIS — L600 Ingrowing nail: Secondary | ICD-10-CM | POA: Diagnosis not present

## 2016-09-15 MED ORDER — LITHIUM CARBONATE 300 MG PO TABS: 300.0000 mg | ORAL_TABLET | Freq: Two times a day (BID) | ORAL | 1 refills | Status: DC

## 2016-09-15 MED ORDER — DIVALPROEX SODIUM 500 MG PO DR TAB: 500.0000 mg | DELAYED_RELEASE_TABLET | Freq: Two times a day (BID) | ORAL | 1 refills | Status: DC

## 2016-09-15 MED ORDER — ALPRAZOLAM 0.5 MG PO TABS: 0.5000 mg | ORAL_TABLET | Freq: Every evening | ORAL | 1 refills | Status: DC | PRN

## 2016-09-15 NOTE — Progress Notes (Signed)
BH MD/PA/NP OP Progress Note  09/15/2016 8:28 AM Margaret Lopez  MRN:  [id number]  Chief Complaint:  Chief Complaint    Follow-up     HPI: Pt has lost about 50 lbs since May. Pt states she feels her psych meds are not right.   Pt reports her anhedonia and anger have worsened. She is not interested in things and she is getting mad/yelling about small things.  She feels her anger is out of proportion to the situation and often must walk around to get over it. Pt reports she feels sad 3-4x/week and will feel like crying but doesn't. Pt denies SI/HI.  Sleep is fair with Xanax. She is no longer using her CPAP due to weight loss. Energy is ok.   Denies manic and hypomanic symptoms including periods of decreased need for sleep, increased energy, mood lability, impulsivity, FOI, and excessive spending.  Pt states she has anxiety most days and level is about 7/10. She has racing thoughts, has palpitations and is figit-ing. Sometimes leaving and walking will improve her anxiety but a lot of times nothing helps.   Visit Diagnosis:    ICD-10-CM   1. Generalized anxiety disorder F41.1 ALPRAZolam (XANAX) 0.5 MG tablet  2. Bipolar 1 disorder, depressed (HCC) F31.9 ALPRAZolam (XANAX) 0.5 MG tablet    divalproex (DEPAKOTE) 500 MG DR tablet    lithium 300 MG tablet     Past Psychiatric History:  Anxiety:Yes Bipolar Disorder:Yes Depression:Yes Mania:Yes Psychosis:No Schizophrenia:No Personality Disorder:No Hospitalization for psychiatric illness:No History of Electroconvulsive Shock Therapy:No Prior Suicide Attempts:No  Past Medical History:  Past Medical History:  Diagnosis Date  . Anxiety   . Bipolar disorder ([place])   . Depression   . Deviated nasal septum 02/2011  . GERD (gastroesophageal reflux disease)   . Headache(784.0)    migraines  . Nasal turbinate hypertrophy 02/2011   bilat.  . Neuromuscular disorder [facility])    Dr. Jerilynn Mages. Domingo Cocking- does injections around nerve in her head  for prevention of migraines - q 3 months   . Pneumonia    hx of   . PONV (postoperative nausea and vomiting)   . Seasonal allergies   . Sleep apnea    uses BIPAP nightly    Past Surgical History:  Procedure Laterality Date  . ABDOMINAL HYSTERECTOMY    . CARPAL TUNNEL RELEASE Right   . DIAGNOSTIC LAPAROSCOPY    . FOOT SURGERY Right    bone removed from great toe   . GASTRIC ROUX-EN-Y N/A 05/16/2016   Procedure: LAPAROSCOPIC ROUX-EN-Y GASTRIC BYPASS WITH UPPER ENDOSCOPY;  Surgeon: Clovis Riley, MD;  Location: [facility];  Service: General;  Laterality: N/A;  . LAPAROSCOPIC VAGINAL HYSTERECTOMY  03/14/2007  . LIPOMA EXCISION Left 08/26/2016   Procedure: EXCISION LIPOMA FROM LEFT UPPER POSTERIOR THIGH;  Surgeon: Clovis Riley, MD;  Location: [place];  Service: General;  Laterality: Left;  . MYRINGOTOMY WITH TUBE PLACEMENT Bilateral 03/02/2015   Procedure: MYRINGOTOMY WITH T-TUBE PLACEMENT;  Surgeon: Leta Baptist, MD;  Location: [place];  Service: ENT;  Laterality: Bilateral;  . NASAL SEPTOPLASTY W/ TURBINOPLASTY  02/22/2011   Procedure: NASAL SEPTOPLASTY WITH TURBINATE REDUCTION;  Surgeon: Ascencion Dike, MD;  Location: [place];  Service: ENT;  Laterality: Bilateral;  . TUBAL LIGATION  12/16/2004    Family Psychiatric History:  Family History  Problem Relation Age of Onset  . ADD / ADHD Sister   . Alcohol abuse Maternal Grandmother   . Bipolar  disorder Maternal Grandmother   . Breast cancer Maternal Grandmother   . Breast cancer Maternal Aunt   . Suicidality Neg Hx     Social History:  Social History   Social History  . Marital status: Legally Separated    Spouse name: N/A  . Number of children: N/A  . Years of education: N/A   Social History Main Topics  . Smoking status: Former Smoker    Packs/day: 0.50    Years: 4.00    Types: Cigarettes    Quit date: 11/12/2015  . Smokeless tobacco: Never Used  . Alcohol use No     Comment: once a month or  less  . Drug use: No  . Sexual activity: Yes    Partners: Male    Birth control/ protection: Surgical     Comment: Hysterectomy   Other Topics Concern  . None   Social History Narrative  . None    Allergies:  Allergies  Allergen Reactions  . Doxazosin Nausea And Vomiting  . Morphine And Related Itching  . Phenergan [Promethazine Hcl]     " knocks out for several hours"  . Aloe Hives  . Penicillins Rash    Has patient had a PCN reaction causing immediate rash, facial/tongue/throat swelling, SOB or lightheadedness with hypotension: Yes Has patient had a PCN reaction causing severe rash involving mucus membranes or skin necrosis: No Has patient had a PCN reaction that required hospitalization No Has patient had a PCN reaction occurring within the last 10 years: No If all of the above answers are "NO", then may proceed with Cephalosporin use.   . Sulfa Antibiotics Rash    Metabolic Disorder Labs: No results found for: HGBA1C, MPG No results found for: PROLACTIN No results found for: CHOL, TRIG, HDL, CHOLHDL, VLDL, LDLCALC No results found for: TSH  Therapeutic Level Labs: No results found for: LITHIUM No results found for: VALPROATE No components found for:  CBMZ  Current Medications: Current Outpatient Prescriptions  Medication Sig Dispense Refill  . acetaminophen (TYLENOL) 500 MG tablet Take 1,000 mg by mouth every 6 (six) hours as needed (for pain/headaches).     . ALPRAZolam (XANAX) 0.5 MG tablet Take 1 tablet (0.5 mg total) by mouth at bedtime as needed for anxiety. 30 tablet 1  . hydrOXYzine (ATARAX/VISTARIL) 25 MG tablet Take 25 mg by mouth at bedtime as needed for itching.   3  . levocetirizine (XYZAL) 5 MG tablet Take 5 mg by mouth at bedtime.     [name] lithium carbonate (LITHOBID) 300 MG CR tablet Take 2 tablets (600 mg total) by mouth 2 (two) times daily. (Patient taking differently: Take 300 mg by mouth 2 (two) times daily. ) 120 tablet 2  . ondansetron  (ZOFRAN-ODT) 4 MG disintegrating tablet Take 4 mg by mouth every 6 (six) hours as needed. For nausea/vomiting.  0  . valACYclovir (VALTREX) 500 MG tablet Take 500 mg by mouth daily as needed (FOR FLARE UPS).   3  . zonisamide (ZONEGRAN) 25 MG capsule Take 75 mg by mouth at bedtime.   2  . albuterol (PROVENTIL HFA;VENTOLIN HFA) 108 (90 Base) MCG/ACT inhaler Inhale 1-2 puffs into the lungs every 6 (six) hours as needed for wheezing or shortness of breath.    [name] azelastine (OPTIVAR) 0.05 % ophthalmic solution Place 1 drop into both eyes as needed (for itchy eyes).   3  . EPINEPHrine 0.3 mg/0.3 mL IJ SOAJ injection Inject 0.3 mg into the muscle daily as needed (  for anaphylatic reactions.).   1  . esomeprazole (NEXIUM) 40 MG capsule Take 40 mg by mouth 2 (two) times daily as needed. For acid reflux/indigestion.  6  . HYDROcodone-acetaminophen (HYCET) 7.5-325 mg/15 ml solution Take 10 mLs by mouth 4 (four) times daily as needed for moderate pain. (Patient not taking: Reported on 09/15/2016) 150 mL 0  . ondansetron (ZOFRAN) 8 MG tablet Take 4-8 mg by mouth daily as needed for nausea or vomiting.      No current facility-administered medications for this visit.      Musculoskeletal: Strength & Muscle Tone: within normal limits Gait & Station: normal Patient leans: N/A  Psychiatric Specialty Exam: Review of Systems  Gastrointestinal: Positive for nausea. Negative for abdominal pain, constipation, diarrhea and vomiting.  Psychiatric/Behavioral: Positive for depression. Negative for hallucinations, substance abuse and suicidal ideas. The patient is nervous/anxious. The patient does not have insomnia.     Blood pressure 117/77, pulse 72, height 5\' 3"  (1.6 m), weight 179 lb 6.4 oz (81.4 kg).Body mass index is 31.78 kg/m.  General Appearance: Fairly Groomed  Eye Contact:  Good  Speech:  Clear and Coherent and Normal Rate  Volume:  Normal  Mood:  Anxious  Affect:  Congruent  Thought Process:  Goal  Directed  Orientation:  Full (Time, Place, and Person)  Thought Content: Logical   Suicidal Thoughts:  No  Homicidal Thoughts:  No  Memory:  Immediate;   Good Recent;   Good Remote;   Good  Judgement:  Fair  Insight:  Good  Psychomotor Activity:  Normal  Concentration:  Concentration: Good and Attention Span: Good  Recall:  Good  Fund of Knowledge: Good  Language: Good  Akathisia:  No  Handed:  Right  AIMS (if indicated): not done  Assets:  Communication Skills Desire for Improvement Financial Resources/Insurance Housing Intimacy  ADL's:  Intact  Cognition: WNL  Sleep:  Good   Screenings: PHQ2-9     Nutrition [facility]/2016 [facility] Nutrition from 05/09/2016 [facility] Nutrition from 02/05/2016 in [facility]  PHQ-2 Total Score  0  0  0       Assessment and Plan: Bipolar I disorder; GAD   Medication management with supportive therapy. Risks/benefits and SE of the medication discussed. Pt verbalized understanding and verbal consent obtained for treatment.  Affirm with the patient that the medications are taken as ordered. Patient expressed understanding of how their medications were to be used.   The risk of un-intended pregnancy is low based on the fact that pt reports she had a hysterectomy. Pt is aware that these meds carry a teratogenic risk. Pt will discuss plan of action if she does or plans to become pregnant in the future.   Meds: Lithium 300mg  po BID for Bipolar disorder Xanax 0.5mg  po qD prn anxiety Start Depakote 500mg  po BID for Bipolar disorder  Labs: 08/24/2016 BMP WNL, CBC WNL  Therapy: brief supportive therapy provided. Discussed psychosocial stressors in detail.     Consultations: none  Pt denies SI and is at an acute low risk for suicide. Patient told to call clinic if any problems occur. Patient advised to go to ER if they should develop SI/HI, side  effects, or if symptoms worsen. Has crisis numbers to call if needed. Pt verbalized understanding.  F/up in 3 months or sooner if needed    Charlcie Cradle, MD 09/15/2016, 8:28 AM

## 2016-09-21 ENCOUNTER — Telehealth (HOSPITAL_COMMUNITY): Payer: Self-pay

## 2016-09-21 ENCOUNTER — Other Ambulatory Visit (HOSPITAL_COMMUNITY): Payer: Self-pay

## 2016-09-21 NOTE — Telephone Encounter (Signed)
Patient is calling because the Depakote DR is not for direct release, it is delayed release and she can not take it with her bypass surgery. She said that there is another form of Depakote that is immediate and would like to know if you can change the prescription. Please review and advise, thank you

## 2016-09-22 MED ORDER — VALPROIC ACID 250 MG PO CAPS: 500.0000 mg | ORAL_CAPSULE | Freq: Two times a day (BID) | ORAL | 0 refills | Status: DC

## 2016-09-22 NOTE — Telephone Encounter (Signed)
I called the patient and let her know.

## 2016-09-22 NOTE — Telephone Encounter (Signed)
I have d/c the previous Depakote order and put in an order for Depakene capsules to her pharmacy.

## 2016-09-27 DIAGNOSIS — G4482 Headache associated with sexual activity: Secondary | ICD-10-CM | POA: Diagnosis not present

## 2016-09-27 DIAGNOSIS — G43019 Migraine without aura, intractable, without status migrainosus: Secondary | ICD-10-CM | POA: Diagnosis not present

## 2016-09-27 DIAGNOSIS — M791 Myalgia: Secondary | ICD-10-CM | POA: Diagnosis not present

## 2016-09-27 DIAGNOSIS — G518 Other disorders of facial nerve: Secondary | ICD-10-CM | POA: Diagnosis not present

## 2016-09-27 DIAGNOSIS — M542 Cervicalgia: Secondary | ICD-10-CM | POA: Diagnosis not present

## 2016-09-30 ENCOUNTER — Encounter: Payer: Self-pay | Admitting: Podiatry

## 2016-09-30 ENCOUNTER — Ambulatory Visit (INDEPENDENT_AMBULATORY_CARE_PROVIDER_SITE_OTHER): Payer: BLUE CROSS/BLUE SHIELD | Admitting: Podiatry

## 2016-09-30 VITALS — BP 113/71 | HR 56

## 2016-09-30 DIAGNOSIS — L600 Ingrowing nail: Secondary | ICD-10-CM | POA: Diagnosis not present

## 2016-09-30 NOTE — Patient Instructions (Signed)

## 2016-09-30 NOTE — Progress Notes (Signed)
Subjective:    Patient ID: Margaret Lopez, female   DOB: 46 y.o.   MRN: [id number]   HPI patient states my left big toenail has been bothering me a lot    ROS      Objective:  Physical Exam neurovascular status intact with incurvated left hallux medial border that's painful when pressed     Assessment:  Ingrown toenail deformity left hallux medial border       Plan:    Reviewed condition and recommended correction of deformity and allow patient to read consent form for procedure understanding risk. Today I infiltrated the left hallux 60 mg like Marcaine mixture remove the border exposed matrix and applied phenol 3 applications 30 seconds followed by alcohol lavaged sterile dressing. Gave instructions on soaks and reappoint

## 2016-09-30 NOTE — Progress Notes (Signed)
   Subjective:    Patient ID: Margaret Lopez, female    DOB: 07-10-1970, 46 y.o.   MRN: [id number]  HPI  Chief Complaint  Patient presents with  . Nail Problem       Review of Systems  Constitutional: Positive for unexpected weight change.  Gastrointestinal: Positive for nausea.  Neurological: Positive for headaches.  All other systems reviewed and are negative.      Objective:   Physical Exam        Assessment & Plan:

## 2016-10-01 ENCOUNTER — Encounter: Payer: Self-pay | Admitting: Podiatry

## 2016-10-01 NOTE — Progress Notes (Signed)
Patient called regarding bleeding and drainage of her toe. Had ingrown toenail removed yesterday. Had some bleeding through the dressing last night. Hasn't soaked yet. Advised to start soaking the toe. Advised Tylenol and ibuprofen for pain.  Told to call back if purulent drainage continues.

## 2016-10-05 ENCOUNTER — Telehealth: Payer: Self-pay | Admitting: Podiatry

## 2016-10-05 ENCOUNTER — Telehealth: Payer: Self-pay

## 2016-10-05 MED ORDER — CEPHALEXIN 500 MG PO CAPS: 500.0000 mg | ORAL_CAPSULE | Freq: Three times a day (TID) | ORAL | 0 refills | Status: DC

## 2016-10-05 NOTE — Telephone Encounter (Signed)
Spoke with patient regarding redness in her toe. She had an ingrown removed on 09/30/16, she states that the redness is streaking up her toe toward her foot. Denies fever, chills or nausea. I advised her to continue with soaking her toe, called in keflex TID x10 days, if her toe is not showing any improvement she is to come in to be seen.

## 2016-10-05 NOTE — Telephone Encounter (Signed)
I was seen by Dr. Paulla Dolly on Friday for an ingrown toenail that he removed. Since the appointment, the area around the joint has become red all the way to the top it is more red than the rest of the toe. This has steadily gotten worst over the past few days. I have been soaking it at least twice a day sometimes three times a day in the epsom salt and water. The drainage has gone down but it still feels very full. I was just concerned and wanted it have to be known.

## 2016-10-10 ENCOUNTER — Encounter: Payer: BLUE CROSS/BLUE SHIELD | Attending: Surgery | Admitting: Skilled Nursing Facility1

## 2016-10-10 ENCOUNTER — Encounter: Payer: Self-pay | Admitting: Skilled Nursing Facility1

## 2016-10-10 DIAGNOSIS — Z713 Dietary counseling and surveillance: Secondary | ICD-10-CM | POA: Diagnosis not present

## 2016-10-10 NOTE — Patient Instructions (Addendum)
-  Try eating non-meat protein   -Try the multivitamin capsule with food  -Get in A MINIMUM of 64 fluid ounces   -At 30 minutes stop eating   -Aim for 5000 steps a day

## 2016-10-10 NOTE — Progress Notes (Signed)
Follow-up visit:  8 Weeks Post-Operative RYGB Surgery  Medical Nutrition Therapy:  Appt start time: 3:20 end time:  4:11.  Primary concerns today: Post-operative Bariatric Surgery Nutrition Management.  Non scale victories: more active  Pt states she is feeling nauseaous every day all day. Pt states she downloaded the baritastic app. Pt states she is struggling to eat meat. Pt states she is having a hard time eating chicken and steak: They don't taste good. Pt states she cannot take her multivitamin because he has to take so many other medications there is not room in her stomach stating she has to take her medications at night because they make her so sleepy. Pt states fat and sugar make her really naueasus.    Surgery date: 05/16/2016 Surgery type: RYGB Start weight at [facility]: 227.3 lbs 02/05/2016 Weight today: 197.8 lbs Weight change: 20.4 Weight loss goal: less than 150 lbs, learn to eat healthier foods, be more active/present with family  TANITA  BODY COMP RESULTS  N/A 07/12/2016 09/14/2016 10/10/2016   BMI (kg/m^2)  35.0 31.4 30.6   Fat Mass (lbs)  87.4 70.4 63   Fat Free Mass (lbs)  110.4 107 110   Total Body Water (lbs)  79.4 76 78    24-hr recall: going long period of times without eating B (AM): protein shake (30g) or bacon and egg biscuit  Snk (AM): none  L (PM): 1 ounce of smoked pork BBQ from a restaurant and baked beans and 2 chips Snk (PM):  4pm: half a sandwich sized bag of chex cereal and pretzels D (PM): hot dog wiener  Snk (PM):   Fluid intake: 1 protein shakes, water with sugar free coolaid (16.9 oz), propel (18.9 oz), 14 ounces coffee with sweet n low and non dairy creamer: 34.9 fluid ounces  Estimated total protein intake: 70g  Medications: See list Supplementation: not been taking it Using straws: no Drinking while eating: no Having you been chewing well: yes Chewing/swallowing difficulties: once Changes in vision: no Changes to mood/headaches:  no Hair loss/Changes to skin/Changes to nails: no Any difficulty focusing or concentrating: no Sweating: no Dizziness/Lightheaded: no Palpitations: no  Carbonated beverages: no N/V/D/C/GAS: nausea often, vomited a couple times with taking too many medicines at once Abdominal Pain: no Dumping syndrome: no  Recent physical activity:  Walking 20-30 min, 6x/week  Progress Towards Goal(s):  In progress.   Nutritional Diagnosis:  Inadequate fluid intake As related to bariatric surgery post-op recommendations.  As evidenced by dietary recall of less than 64 oz of fluid daily. .    Intervention:  Nutrition education and counseling. Goals: -Try eating non-meat protein  -Try the multivitamin capsule with food -Get in A MINIMUM of 64 fluid ounces  -At 30 minutes stop eating  -Aim for 5000 steps a day  Teaching Method Utilized:  Visual Auditory Hands on  Barriers to learning/adherence to lifestyle change: none  Demonstrated degree of understanding via:  Teach Back   Monitoring/Evaluation:  Dietary intake, exercise, and body weight.

## 2016-10-11 DIAGNOSIS — H6983 Other specified disorders of Eustachian tube, bilateral: Secondary | ICD-10-CM | POA: Diagnosis not present

## 2016-10-11 DIAGNOSIS — H903 Sensorineural hearing loss, bilateral: Secondary | ICD-10-CM | POA: Diagnosis not present

## 2016-10-11 DIAGNOSIS — H6121 Impacted cerumen, right ear: Secondary | ICD-10-CM | POA: Diagnosis not present

## 2016-10-11 DIAGNOSIS — H9209 Otalgia, unspecified ear: Secondary | ICD-10-CM | POA: Diagnosis not present

## 2016-10-13 ENCOUNTER — Ambulatory Visit (HOSPITAL_COMMUNITY): Payer: Self-pay | Admitting: Psychiatry

## 2016-10-23 ENCOUNTER — Other Ambulatory Visit (HOSPITAL_COMMUNITY): Payer: Self-pay | Admitting: Psychiatry

## 2016-11-16 ENCOUNTER — Encounter: Payer: BLUE CROSS/BLUE SHIELD | Attending: Surgery | Admitting: Skilled Nursing Facility1

## 2016-11-16 ENCOUNTER — Encounter: Payer: Self-pay | Admitting: Skilled Nursing Facility1

## 2016-11-16 DIAGNOSIS — Z713 Dietary counseling and surveillance: Secondary | ICD-10-CM | POA: Diagnosis not present

## 2016-11-16 NOTE — Patient Instructions (Addendum)
-  Pack your lunch even if at home  -Always have food on you: nuts or fruit or protein bar etc.  --Set alarms on baritastic app to set up a specific eating and drinking schedule

## 2016-11-16 NOTE — Progress Notes (Signed)
Follow-up visit:  8 Weeks Post-Operative RYGB Surgery  Medical Nutrition Therapy:  Appt start time: 3:20 end time:  4:11.  Primary concerns today: Post-operative Bariatric Surgery Nutrition Management.  Non scale victories: more active  Pt states fat and sugar make her really naueasus.    Pt states she Had to put her dog down so she was not eating for a week. Pt states she gets nausea if not eating every 3 hours and going longer than 4 hours hard to eat. Pt states she Was wondering if it was acid so taking prilosec for 2 days but no change with it. Pt states she has been working on eating beef and that has gone well. Pt states she thinks she needs to stick with pork and plant based proteins to not be sick. Pt states the days she is eating less is due to more nausea the day before leading her to not be hungry the next day. Pt states she is Taking the multivitamin now.Pt states she has Better bowel movements when drinking and eating more. Pt states she is going to try her nexium for a week to see if it helps the nausea. Pt states she gets caught up with work or taking care of house or kid duties to where she does not drink or eat and also states she has to wait at least 45 minutes after eating before she can drink again. Pt asked what is more important eating or drinking because she cannot do both: Dietitian responded both are equally important.   Surgery date: 05/16/2016 Surgery type: RYGB Start weight at [facility]: 227.3 lbs 02/05/2016 Weight today: 160.6 lbs Weight change: 11 Weight loss goal: less than 150 lbs, learn to eat healthier foods, be more active/present with family  TANITA  BODY COMP RESULTS  N/A 07/12/2016 09/14/2016 10/10/2016 11/16/2016   BMI (kg/m^2)  35.0 31.4 30.6 28.4   Fat Mass (lbs)  87.4 70.4 63 57   Fat Free Mass (lbs)  110.4 107 110 103.6   Total Body Water (lbs)  79.4 76 78 73    24-hr recall: going long period of times without eating B (AM): bacon and egg biscuit (2/3  of that) OR coffee with 1/2 egg and bacon biscuit Snk (AM): protein shake----none L (PM): 1 yogurt (not greek)-----maybe yogurt Snk (PM): veggie noodles with ground beef----none 4pm: half a sandwich sized bag of chex cereal and pretzels----none D (PM): gardein fo chicken and rice or italian sausage on veggie noodles green peppers and onions and tomatoes----pizza Snk (PM):   Fluid intake: 1 protein shakes, water with sugar free coolaid (16.9 oz), propel (18.9 oz), 14 ounces coffee with sweet n low and non dairy creamer: 34.9 fluid ounces: only 16.9 oz of water Estimated total protein intake: 70g  Medications: See list Supplementation: not been taking it Using straws: no Drinking while eating: no Having you been chewing well: yes Chewing/swallowing difficulties: once Changes in vision: no Changes to mood/headaches: no Hair loss/Changes to skin/Changes to nails: no Any difficulty focusing or concentrating: no Sweating: no Dizziness/Lightheaded: no Palpitations: no  Carbonated beverages: no N/V/D/C/GAS: nausea often, Abdominal Pain: no Dumping syndrome: no  Recent physical activity:  Walking 20-30 min, 6x/week  Progress Towards Goal(s):  In progress.   Nutritional Diagnosis:  Inadequate fluid intake As related to bariatric surgery post-op recommendations.  As evidenced by dietary recall of less than 64 oz of fluid daily. .    Intervention:  Nutrition education and counseling. Goals: -Pack your lunch  even if at home -Always have food on you: nuts or fruit or protein bar etc. -Set alarms on baritastic app to set up a specific eating and drinking schedule Teaching Method Utilized:  Visual Auditory Hands on  Barriers to learning/adherence to lifestyle change: none  Demonstrated degree of understanding via:  Teach Back   Monitoring/Evaluation:  Dietary intake, exercise, and body weight.

## 2016-11-17 ENCOUNTER — Ambulatory Visit (INDEPENDENT_AMBULATORY_CARE_PROVIDER_SITE_OTHER): Payer: BLUE CROSS/BLUE SHIELD | Admitting: Psychiatry

## 2016-11-17 ENCOUNTER — Encounter (HOSPITAL_COMMUNITY): Payer: Self-pay | Admitting: Psychiatry

## 2016-11-17 DIAGNOSIS — F411 Generalized anxiety disorder: Secondary | ICD-10-CM

## 2016-11-17 DIAGNOSIS — F319 Bipolar disorder, unspecified: Secondary | ICD-10-CM | POA: Diagnosis not present

## 2016-11-17 MED ORDER — VALPROIC ACID 250 MG PO CAPS: ORAL_CAPSULE | ORAL | 3 refills | Status: DC

## 2016-11-17 MED ORDER — LITHIUM CARBONATE 300 MG PO TABS: 300.0000 mg | ORAL_TABLET | Freq: Two times a day (BID) | ORAL | 3 refills | Status: DC

## 2016-11-17 NOTE — Progress Notes (Signed)
[place] MD/PA/NP OP Progress Note  11/17/2016 8:39 AM Margaret Lopez  MRN:  [id number]  Chief Complaint:  Chief Complaint    Follow-up     HPI: Pt has lost 75 lbs and continues to work with her nutrionist.    Her dog of 12 yrs passed a few weeks ago. This has caused her mood to decline. She does thinks her meds are helping to control her depression and irritability. Pt denies anhedonia, isolation, crying spells, hopelessness and worthlessness. Pt denies SI/HI.   Pt is sleeping about 8 hrs/night. Energy and appetite are good.   Pt denies manic and hypomanic symptoms including periods of decreased need for sleep, increased energy, mood lability, impulsivity, FOI, and excessive spending.  Pt denies anxiety and states she is doing well.   If she takes all her meds at once it once it makes her nauseous. She is spacing her meds out and she is all (BID dosing) between 6-9pm. She states she can't tolerate any meds in the AM due to daytime sedation.    Visit Diagnosis:    ICD-10-CM   1. Generalized anxiety disorder F41.1   2. Bipolar 1 disorder, depressed (HCC) F31.9 lithium 300 MG tablet    Past Psychiatric History:  Anxiety: Yes Bipolar Disorder: Yes Depression: Yes Mania: Yes Psychosis: No Schizophrenia: No Personality Disorder: No Hospitalization for psychiatric illness: No History of Electroconvulsive Shock Therapy: No Prior Suicide Attempts: No   Past Medical History:  Past Medical History:  Diagnosis Date  . Anxiety   . Bipolar disorder ([place])   . Depression   . Deviated nasal septum 02/2011  . GERD (gastroesophageal reflux disease)   . Headache(784.0)    migraines  . Nasal turbinate hypertrophy 02/2011   bilat.  . Neuromuscular disorder [facility])    Dr. Jerilynn Mages. Domingo Cocking- does injections around nerve in her head for prevention of migraines - q 3 months   . Pneumonia    hx of   . PONV (postoperative nausea and vomiting)   . Seasonal allergies   . Sleep apnea    uses BIPAP  nightly    Past Surgical History:  Procedure Laterality Date  . ABDOMINAL HYSTERECTOMY    . CARPAL TUNNEL RELEASE Right   . DIAGNOSTIC LAPAROSCOPY    . FOOT SURGERY Right    bone removed from great toe   . LAPAROSCOPIC VAGINAL HYSTERECTOMY  03/14/2007  . TUBAL LIGATION  12/16/2004    Family Psychiatric and Medical History: Family History  Problem Relation Age of Onset  . ADD / ADHD Sister   . Alcohol abuse Maternal Grandmother   . Bipolar disorder Maternal Grandmother   . Breast cancer Maternal Grandmother   . Breast cancer Maternal Aunt   . Suicidality Neg Hx     Social History:  Social History   Socioeconomic History  . Marital status: Legally Separated    Spouse name: None  . Number of children: None  . Years of education: None  . Highest education level: None  Social Needs  . Financial resource strain: None  . Food insecurity - worry: None  . Food insecurity - inability: None  . Transportation needs - medical: None  . Transportation needs - non-medical: None  Occupational History  . None  Tobacco Use  . Smoking status: Former Smoker    Packs/day: 0.50    Years: 4.00    Pack years: 2.00    Types: Cigarettes    Last attempt to quit: 11/12/2015  Years since quitting: 1.0  . Smokeless tobacco: Never Used  Substance and Sexual Activity  . Alcohol use: No    Alcohol/week: 0.0 oz    Comment: once a month or less  . Drug use: No  . Sexual activity: Yes    Partners: Male    Birth control/protection: Surgical    Comment: Hysterectomy  Other Topics Concern  . None  Social History Narrative  . None    Allergies:  Allergies  Allergen Reactions  . Doxazosin Nausea And Vomiting  . Morphine And Related Itching  . Phenergan [Promethazine Hcl]     " knocks out for several hours"  . Aloe Hives  . Penicillins Rash    Has patient had a PCN reaction causing immediate rash, facial/tongue/throat swelling, SOB or lightheadedness with hypotension: Yes Has patient  had a PCN reaction causing severe rash involving mucus membranes or skin necrosis: No Has patient had a PCN reaction that required hospitalization No Has patient had a PCN reaction occurring within the last 10 years: No If all of the above answers are "NO", then may proceed with Cephalosporin use.   . Sulfa Antibiotics Rash    Metabolic Disorder Labs: No results found for: HGBA1C, MPG No results found for: PROLACTIN No results found for: CHOL, TRIG, HDL, CHOLHDL, VLDL, LDLCALC No results found for: TSH  Therapeutic Level Labs: No results found for: LITHIUM No results found for: VALPROATE No components found for:  CBMZ  Current Medications: Current Outpatient Medications  Medication Sig Dispense Refill  . acetaminophen (TYLENOL) 500 MG tablet Take 1,000 mg by mouth every 6 (six) hours as needed (for pain/headaches).     [name] albuterol (PROVENTIL HFA;VENTOLIN HFA) 108 (90 Base) MCG/ACT inhaler Inhale 1-2 puffs into the lungs every 6 (six) hours as needed for wheezing or shortness of breath.    . ALPRAZolam (XANAX) 0.5 MG tablet Take 1 tablet (0.5 mg total) by mouth at bedtime as needed for anxiety. 30 tablet 1  . EPINEPHrine 0.3 mg/0.3 mL IJ SOAJ injection Inject 0.3 mg into the muscle daily as needed (for anaphylatic reactions.).   1  . hydrOXYzine (ATARAX/VISTARIL) 25 MG tablet Take 25 mg by mouth at bedtime as needed for itching.   3  . levocetirizine (XYZAL) 5 MG tablet Take 5 mg by mouth at bedtime.     [name] lithium 300 MG tablet Take 1 tablet (300 mg total) by mouth 2 (two) times daily. 60 tablet 1  . ondansetron (ZOFRAN-ODT) 4 MG disintegrating tablet Take 4 mg by mouth every 6 (six) hours as needed. For nausea/vomiting.  0  . valACYclovir (VALTREX) 500 MG tablet Take 500 mg by mouth daily as needed (FOR FLARE UPS).   3  . valproic acid (DEPAKENE) 250 MG capsule TAKE 2 CAPSULES(500 MG) BY MOUTH TWICE DAILY 120 capsule 0  . zonisamide (ZONEGRAN) 25 MG capsule Take 75 mg by mouth at  bedtime.   2  . esomeprazole (NEXIUM) 40 MG capsule Take 40 mg by mouth 2 (two) times daily as needed. For acid reflux/indigestion.  6   No current facility-administered medications for this visit.      Musculoskeletal: Strength & Muscle Tone: within normal limits Gait & Station: normal Patient leans: N/A  Psychiatric Specialty Exam: Review of Systems  Gastrointestinal: Positive for nausea. Negative for abdominal pain, heartburn and vomiting.  Neurological: Negative for dizziness, tingling, tremors, sensory change and headaches.  Psychiatric/Behavioral: Positive for depression. Negative for hallucinations, substance abuse and suicidal ideas. The  patient does not have insomnia.     Blood pressure 126/72, pulse 65, height 5\' 3"  (1.6 m), weight 146 lb 12.8 oz (66.6 kg).Body mass index is 26 kg/m.  General Appearance: Fairly Groomed  Eye Contact:  Good  Speech:  Clear and Coherent and Normal Rate  Volume:  Normal  Mood:  Depressed  Affect:  Full Range  Thought Process:  Goal Directed and Descriptions of Associations: Intact  Orientation:  Full (Time, Place, and Person)  Thought Content: Logical   Suicidal Thoughts:  No  Homicidal Thoughts:  No  Memory:  Immediate;   Good Recent;   Good Remote;   Good  Judgement:  Good  Insight:  Good  Psychomotor Activity:  Normal  Concentration:  Concentration: Good and Attention Span: Good  Recall:  Good  Fund of Knowledge: Good  Language: Good  Akathisia:  No  Handed:  Right  AIMS (if indicated): not done  Assets:  Communication Skills Desire for Improvement Financial Resources/Insurance Housing Leisure Time Social Support Transportation Vocational/Educational  ADL's:  Intact  Cognition: WNL  Sleep:  Good   Screenings: PHQ2-9     Nutrition from 07/12/2016 in [facility] Nutrition from 05/09/2016 in [facility] Nutrition from 02/05/2016 in [facility]  PHQ-2 Total Score  0  0  0       Assessment and Plan: Bipolar I disorder; GAD   Medication management with supportive therapy. Risks/benefits and SE of the medication discussed. Pt verbalized understanding and verbal consent obtained for treatment.  Affirm with the patient that the medications are taken as ordered. Patient expressed understanding of how their medications were to be used.   Meds:  Lithium 300mg  po BID for Bipolar disorder Xanax 0.5mg  po qD prn anxiety Depakene 500mg  po BID for Bipolar disorder   Labs: 08/24/2016 CBC and BMP WNL  Therapy: brief supportive therapy provided. Discussed psychosocial stressors in detail.     Consultations: none  Pt denies SI and is at an acute low risk for suicide. Patient told to call clinic if any problems occur. Patient advised to go to ER if they should develop SI/HI, side effects, or if symptoms worsen. Has crisis numbers to call if needed. Pt verbalized understanding.  F/up in 3 months or sooner if needed    Charlcie Cradle, MD 11/17/2016, 8:39 AM

## 2016-11-23 ENCOUNTER — Other Ambulatory Visit: Payer: Self-pay | Admitting: Surgery

## 2016-11-23 DIAGNOSIS — R1084 Generalized abdominal pain: Secondary | ICD-10-CM | POA: Diagnosis not present

## 2016-11-23 DIAGNOSIS — Z9884 Bariatric surgery status: Secondary | ICD-10-CM | POA: Diagnosis not present

## 2016-11-24 ENCOUNTER — Ambulatory Visit (HOSPITAL_COMMUNITY)
Admission: RE | Admit: 2016-11-24 | Discharge: 2016-11-24 | Disposition: A | Discharge status: Home / Self Care | Payer: BLUE CROSS/BLUE SHIELD | Source: Ambulatory Visit | Attending: Surgery | Admitting: Surgery

## 2016-11-24 DIAGNOSIS — E860 Dehydration: Secondary | ICD-10-CM | POA: Diagnosis not present

## 2016-11-24 LAB — COMPREHENSIVE METABOLIC PANEL
ALT: 7 U/L — ABNORMAL LOW (ref 14–54)
AST: 18 U/L (ref 15–41)
Albumin: 4.4 g/dL (ref 3.5–5.0)
Alkaline Phosphatase: 80 U/L (ref 38–126)
Anion gap: 5 (ref 5–15)
BUN: 12 mg/dL (ref 6–20)
CHLORIDE: 104 mmol/L (ref 101–111)
CO2: 27 mmol/L (ref 22–32)
Calcium: 9.4 mg/dL (ref 8.9–10.3)
Creatinine, Ser: 0.75 mg/dL (ref 0.44–1.00)
Glucose, Bld: 97 mg/dL (ref 65–99)
POTASSIUM: 4.6 mmol/L (ref 3.5–5.1)
Sodium: 136 mmol/L (ref 135–145)
Total Bilirubin: 0.6 mg/dL (ref 0.3–1.2)
Total Protein: 7.2 g/dL (ref 6.5–8.1)

## 2016-11-24 LAB — CBC WITH DIFFERENTIAL/PLATELET
BASOS ABS: 0 10*3/uL (ref 0.0–0.1)
Basophils Relative: 1 %
EOS ABS: 0.1 10*3/uL (ref 0.0–0.7)
EOS PCT: 2 %
HCT: 37.9 % (ref 36.0–46.0)
Hemoglobin: 12.4 g/dL (ref 12.0–15.0)
LYMPHS PCT: 39 %
Lymphs Abs: 1.7 10*3/uL (ref 0.7–4.0)
MCH: 30.7 pg (ref 26.0–34.0)
MCHC: 32.7 g/dL (ref 30.0–36.0)
MCV: 93.8 fL (ref 78.0–100.0)
MONO ABS: 0.3 10*3/uL (ref 0.1–1.0)
Monocytes Relative: 6 %
Neutro Abs: 2.3 10*3/uL (ref 1.7–7.7)
Neutrophils Relative %: 52 %
PLATELETS: 217 10*3/uL (ref 150–400)
RBC: 4.04 MIL/uL (ref 3.87–5.11)
RDW: 12.6 % (ref 11.5–15.5)
WBC: 4.4 10*3/uL (ref 4.0–10.5)

## 2016-11-24 LAB — PHOSPHORUS: Phosphorus: 4.2 mg/dL (ref 2.5–4.6)

## 2016-11-24 LAB — MAGNESIUM: MAGNESIUM: 2.1 mg/dL (ref 1.7–2.4)

## 2016-11-24 MED ORDER — SODIUM CHLORIDE 0.9 % IV SOLN: INTRAVENOUS | Status: DC

## 2016-11-24 MED ORDER — SODIUM CHLORIDE 0.9 % IV BOLUS (SEPSIS)
2000.0000 mL | Freq: Once | INTRAVENOUS | Status: AC
  Administered 2016-11-24: 2000 mL via INTRAVENOUS

## 2016-11-24 NOTE — Progress Notes (Signed)
Provider: Jens Som, MD   Procedure: 2L of Normal Saline IV  Diagnosis Association: Dehydration (E86.0)   Treatment: Patient received 2L of NS. Patient tolerated procedure well. Discharge instructions given to patient and patient states an understanding. Patient alert, oriented, and ambulatory at time of discharge.

## 2016-11-24 NOTE — Discharge Instructions (Signed)
Patient received a 2 liter bolus of Normal Saline.

## 2016-11-25 ENCOUNTER — Other Ambulatory Visit: Payer: Self-pay | Admitting: Surgery

## 2016-11-25 DIAGNOSIS — R1083 Colic: Secondary | ICD-10-CM

## 2016-12-05 ENCOUNTER — Ambulatory Visit
Admission: RE | Admit: 2016-12-05 | Discharge: 2016-12-05 | Disposition: A | Discharge status: Home / Self Care | Payer: BLUE CROSS/BLUE SHIELD | Source: Ambulatory Visit | Attending: Surgery | Admitting: Surgery

## 2016-12-05 DIAGNOSIS — N200 Calculus of kidney: Secondary | ICD-10-CM | POA: Diagnosis not present

## 2016-12-05 DIAGNOSIS — R1083 Colic: Secondary | ICD-10-CM

## 2016-12-05 MED ORDER — IOPAMIDOL (ISOVUE-300) INJECTION 61%
100.0000 mL | Freq: Once | INTRAVENOUS | Status: AC | PRN
  Administered 2016-12-05: 100 mL via INTRAVENOUS

## 2016-12-08 DIAGNOSIS — J069 Acute upper respiratory infection, unspecified: Secondary | ICD-10-CM | POA: Diagnosis not present

## 2016-12-14 ENCOUNTER — Ambulatory Visit: Payer: Self-pay | Admitting: Skilled Nursing Facility1

## 2016-12-20 DIAGNOSIS — G4733 Obstructive sleep apnea (adult) (pediatric): Secondary | ICD-10-CM | POA: Diagnosis not present

## 2016-12-20 DIAGNOSIS — G4700 Insomnia, unspecified: Secondary | ICD-10-CM | POA: Diagnosis not present

## 2016-12-26 ENCOUNTER — Other Ambulatory Visit: Payer: Self-pay | Admitting: Surgery

## 2016-12-27 ENCOUNTER — Ambulatory Visit (HOSPITAL_COMMUNITY)
Admission: RE | Admit: 2016-12-27 | Discharge: 2016-12-27 | Disposition: A | Discharge status: Home / Self Care | Payer: BLUE CROSS/BLUE SHIELD | Source: Ambulatory Visit | Attending: Surgery | Admitting: Surgery

## 2016-12-27 DIAGNOSIS — E860 Dehydration: Secondary | ICD-10-CM | POA: Diagnosis not present

## 2016-12-27 MED ORDER — M.V.I. ADULT IV INJ
INJECTION | INTRAVENOUS | Status: DC
  Administered 2016-12-27: 12:00:00 via INTRAVENOUS
  Filled 2016-12-27 (×4): qty 1000

## 2016-12-27 MED ORDER — SODIUM CHLORIDE 0.9 % IV BOLUS (SEPSIS)
1000.0000 mL | Freq: Once | INTRAVENOUS | Status: AC
  Administered 2016-12-27: 1000 mL via INTRAVENOUS

## 2016-12-27 MED ORDER — SODIUM CHLORIDE 0.9 % IV SOLN: INTRAVENOUS | Status: DC

## 2016-12-27 NOTE — Discharge Instructions (Signed)
Dehydration, Adult Dehydration is a condition in which there is not enough fluid or water in the body. This happens when you lose more fluids than you take in. Important organs, such as the kidneys, brain, and heart, cannot function without a proper amount of fluids. Any loss of fluids from the body can lead to dehydration. Dehydration can range from mild to severe. This condition should be treated right away to prevent it from becoming severe. What are the causes? This condition may be caused by:  Vomiting.  Diarrhea.  Excessive sweating, such as from heat exposure or exercise.  Not drinking enough fluid, especially: ? When ill. ? While doing activity that requires a lot of energy.  Excessive urination.  Fever.  Infection.  Certain medicines, such as medicines that cause the body to lose excess fluid (diuretics).  Inability to access safe drinking water.  Reduced physical ability to get adequate water and food.  What increases the risk? This condition is more likely to develop in people:  Who have a poorly controlled long-term (chronic) illness, such as diabetes, heart disease, or kidney disease.  Who are age 65 or older.  Who are disabled.  Who live in a place with high altitude.  Who play endurance sports.  What are the signs or symptoms? Symptoms of mild dehydration may include:  Thirst.  Dry lips.  Slightly dry mouth.  Dry, warm skin.  Dizziness. Symptoms of moderate dehydration may include:  Very dry mouth.  Muscle cramps.  Dark urine. Urine may be the color of tea.  Decreased urine production.  Decreased tear production.  Heartbeat that is irregular or faster than normal (palpitations).  Headache.  Light-headedness, especially when you stand up from a sitting position.  Fainting (syncope). Symptoms of severe dehydration may include:  Changes in skin, such as: ? Cold and clammy skin. ? Blotchy (mottled) or pale skin. ? Skin that does  not quickly return to normal after being lightly pinched and released (poor skin turgor).  Changes in body fluids, such as: ? Extreme thirst. ? No tear production. ? Inability to sweat when body temperature is high, such as in hot weather. ? Very little urine production.  Changes in vital signs, such as: ? Weak pulse. ? Pulse that is more than 100 beats a minute when sitting still. ? Rapid breathing. ? Low blood pressure.  Other changes, such as: ? Sunken eyes. ? Cold hands and feet. ? Confusion. ? Lack of energy (lethargy). ? Difficulty waking up from sleep. ? Short-term weight loss. ? Unconsciousness. How is this diagnosed? This condition is diagnosed based on your symptoms and a physical exam. Blood and urine tests may be done to help confirm the diagnosis. How is this treated? Treatment for this condition depends on the severity. Mild or moderate dehydration can often be treated at home. Treatment should be started right away. Do not wait until dehydration becomes severe. Severe dehydration is an emergency and it needs to be treated in a hospital. Treatment for mild dehydration may include:  Drinking more fluids.  Replacing salts and minerals in your blood (electrolytes) that you may have lost. Treatment for moderate dehydration may include:  Drinking an oral rehydration solution (ORS). This is a drink that helps you replace fluids and electrolytes (rehydrate). It can be found at pharmacies and retail stores. Treatment for severe dehydration may include:  Receiving fluids through an IV tube.  Receiving an electrolyte solution through a feeding tube that is passed through your nose   and into your stomach (nasogastric tube, or NG tube).  Correcting any abnormalities in electrolytes.  Treating the underlying cause of dehydration. Follow these instructions at home:  If directed by your health care provider, drink an ORS: ? Make an ORS by following instructions on the  package. ? Start by drinking small amounts, about  cup (120 mL) every 5-10 minutes. ? Slowly increase how much you drink until you have taken the amount recommended by your health care provider.  Drink enough clear fluid to keep your urine clear or pale yellow. If you were told to drink an ORS, finish the ORS first, then start slowly drinking other clear fluids. Drink fluids such as: ? Water. Do not drink only water. Doing that can lead to having too little salt (sodium) in the body (hyponatremia). ? Ice chips. ? Fruit juice that you have added water to (diluted fruit juice). ? Low-calorie sports drinks.  Avoid: ? Alcohol. ? Drinks that contain a lot of sugar. These include high-calorie sports drinks, fruit juice that is not diluted, and soda. ? Caffeine. ? Foods that are greasy or contain a lot of fat or sugar.  Take over-the-counter and prescription medicines only as told by your health care provider.  Do not take sodium tablets. This can lead to having too much sodium in the body (hypernatremia).  Eat foods that contain a healthy balance of electrolytes, such as bananas, oranges, potatoes, tomatoes, and spinach.  Keep all follow-up visits as told by your health care provider. This is important. Contact a health care provider if:  You have abdominal pain that: ? Gets worse. ? Stays in one area (localizes).  You have a rash.  You have a stiff neck.  You are more irritable than usual.  You are sleepier or more difficult to wake up than usual.  You feel weak or dizzy.  You feel very thirsty.  You have urinated only a small amount of very dark urine over 6-8 hours. Get help right away if:  You have symptoms of severe dehydration.  You cannot drink fluids without vomiting.  Your symptoms get worse with treatment.  You have a fever.  You have a severe headache.  You have vomiting or diarrhea that: ? Gets worse. ? Does not go away.  You have blood or green matter  (bile) in your vomit.  You have blood in your stool. This may cause stool to look black and tarry.  You have not urinated in 6-8 hours.  You faint.  Your heart rate while sitting still is over 100 beats a minute.  You have trouble breathing. This information is not intended to replace advice given to you by your health care provider. Make sure you discuss any questions you have with your health care provider. Document Released: 12/27/2004 Document Revised: 07/24/2015 Document Reviewed: 02/20/2015 Elsevier Interactive Patient Education  2018 Elsevier Inc[employer]  

## 2016-12-27 NOTE — Progress Notes (Signed)
Ordering Provider: Jens Som, MD  Procedure: 1L of Normal Saline IV; 1L normal saline, thiamine, folic acid, and multivitamins  Diagnosis Association: Dehydration (E86.0)  Treatment: Patient received a bolus infusion of normal saline and 1 liter of fluids infused with vitamins per order; Pt tolerated well; no complications noted

## 2017-01-05 DIAGNOSIS — M542 Cervicalgia: Secondary | ICD-10-CM | POA: Diagnosis not present

## 2017-01-05 DIAGNOSIS — G4482 Headache associated with sexual activity: Secondary | ICD-10-CM | POA: Diagnosis not present

## 2017-01-05 DIAGNOSIS — G43019 Migraine without aura, intractable, without status migrainosus: Secondary | ICD-10-CM | POA: Diagnosis not present

## 2017-01-05 DIAGNOSIS — G518 Other disorders of facial nerve: Secondary | ICD-10-CM | POA: Diagnosis not present

## 2017-01-05 DIAGNOSIS — M791 Myalgia, unspecified site: Secondary | ICD-10-CM | POA: Diagnosis not present

## 2017-01-18 ENCOUNTER — Encounter: Payer: BLUE CROSS/BLUE SHIELD | Attending: Surgery | Admitting: Skilled Nursing Facility1

## 2017-01-18 ENCOUNTER — Encounter: Payer: Self-pay | Admitting: Skilled Nursing Facility1

## 2017-01-18 DIAGNOSIS — Z713 Dietary counseling and surveillance: Secondary | ICD-10-CM | POA: Diagnosis not present

## 2017-01-18 NOTE — Progress Notes (Signed)
Follow-up visit:  8 Weeks Post-Operative RYGB Surgery  Medical Nutrition Therapy:  Appt start time: 3:20 end time:  4:11.  Primary concerns today: Post-operative Bariatric Surgery Nutrition Management.  Non scale victories: more active  Pt states fat and sugar make her really naueasus.     Pt states she has had to get fluids 2 times since her last visit here. Pt states she has had a migraine for 5 days now. Pt states she can tolerate beef and chicken now. Pt states she dipped her chicken in ketchup.   Surgery date: 05/16/2016 Surgery type: RYGB Start weight at [facility]: 227.3 lbs 02/05/2016 Weight today: 151 lbs Weight change: 9.6 Weight loss goal: less than 150 lbs, learn to eat healthier foods, be more active/present with family  TANITA  BODY COMP RESULTS  07/12/2016 09/14/2016 10/10/2016 11/16/2016 01/19/2016   BMI (kg/m^2) 35.0 31.4 30.6 28.4 26.9   Fat Mass (lbs) 87.4 70.4 63 57 51.4   Fat Free Mass (lbs) 110.4 107 110 103.6 100.4   Total Body Water (lbs) 79.4 76 78 73 70.6    24-hr recall: going long period of times without eating B (AM): regular coffee Snk (AM):half jimmy dean and bacon biscuit Decaf coffee L (PM): 3/4 small Stouffers lasagna  Snk (PM): decaf coffee or propel zero 4pm: yogurt D (PM): grilled chicken 1.5 ounces with salad Snk (PM): protein shake   Fluid intake: 2 decaff coffee, 1 regular coffee, propel zero  Estimated total protein intake: 50g  Medications: See list Supplementation: opurity once a day and tums 3 times a day  Using straws: no Drinking while eating: no Having you been chewing well: yes Chewing/swallowing difficulties: once Changes in vision: no Changes to mood/headaches: no Hair loss/Changes to skin/Changes to nails: no Any difficulty focusing or concentrating: no Sweating: no Dizziness/Lightheaded: no Palpitations: no  Carbonated beverages: no N/V/D/C/GAS: nausea often, some constipation  Abdominal Pain: no Dumping syndrome:  no  Recent physical activity:  Walking 20-30 min, 6x/week  Progress Towards Goal(s):  In progress.   Nutritional Diagnosis:  Inadequate fluid intake As related to bariatric surgery post-op recommendations.  As evidenced by dietary recall of less than 64 oz of fluid daily. .    Intervention:  Nutrition education and counseling. Goals: -Stop eating carbohydrates like biscuits and pasta for 2 weeks -Try premier in your coffee as creamer  For breakfast rest of the protein shake Drink   For snack: yogurt Drink   For lunch: Leftovers Drink  For Dinner: fajita filling Drink  Teaching Method Utilized:  [employer] Hands on  Barriers to learning/adherence to lifestyle change: none  Demonstrated degree of understanding via:  Teach Back   Monitoring/Evaluation:  Dietary intake, exercise, and body weight.

## 2017-01-18 NOTE — Patient Instructions (Addendum)
-  Stop eating carbohydrates like biscuits and pasta for 2 weeks  -Try premier in your coffee as creamer   For breakfast rest of the protein shake Drink   For snack: yogurt Drink   For lunch: Leftovers Drink  For Dinner: fajita filling Drink

## 2017-01-19 ENCOUNTER — Other Ambulatory Visit: Payer: Self-pay

## 2017-01-19 ENCOUNTER — Encounter (HOSPITAL_COMMUNITY): Payer: Self-pay | Admitting: Emergency Medicine

## 2017-01-19 ENCOUNTER — Emergency Department (HOSPITAL_COMMUNITY)
Admission: EM | Admit: 2017-01-19 | Discharge: 2017-01-19 | Disposition: A | Discharge status: Home / Self Care | Payer: BLUE CROSS/BLUE SHIELD | Attending: Emergency Medicine | Admitting: Emergency Medicine

## 2017-01-19 DIAGNOSIS — R51 Headache: Secondary | ICD-10-CM | POA: Diagnosis not present

## 2017-01-19 DIAGNOSIS — Z79899 Other long term (current) drug therapy: Secondary | ICD-10-CM | POA: Diagnosis not present

## 2017-01-19 DIAGNOSIS — G43701 Chronic migraine without aura, not intractable, with status migrainosus: Secondary | ICD-10-CM

## 2017-01-19 DIAGNOSIS — R110 Nausea: Secondary | ICD-10-CM | POA: Diagnosis not present

## 2017-01-19 DIAGNOSIS — Z87891 Personal history of nicotine dependence: Secondary | ICD-10-CM | POA: Diagnosis not present

## 2017-01-19 MED ORDER — DIPHENHYDRAMINE HCL 50 MG/ML IJ SOLN
25.0000 mg | Freq: Once | INTRAMUSCULAR | Status: AC
  Administered 2017-01-19: 25 mg via INTRAVENOUS
  Filled 2017-01-19: qty 1

## 2017-01-19 MED ORDER — SODIUM CHLORIDE 0.9 % IV BOLUS (SEPSIS)
1000.0000 mL | Freq: Once | INTRAVENOUS | Status: AC
  Administered 2017-01-19: 1000 mL via INTRAVENOUS

## 2017-01-19 MED ORDER — KETOROLAC TROMETHAMINE 30 MG/ML IJ SOLN
30.0000 mg | Freq: Once | INTRAMUSCULAR | Status: AC
  Administered 2017-01-19: 30 mg via INTRAVENOUS
  Filled 2017-01-19: qty 1

## 2017-01-19 MED ORDER — METOCLOPRAMIDE HCL 10 MG PO TABS
10.0000 mg | ORAL_TABLET | Freq: Once | ORAL | Status: AC
  Administered 2017-01-19: 10 mg via ORAL
  Filled 2017-01-19: qty 1

## 2017-01-19 NOTE — ED Notes (Signed)
Patient fail last Wednesday about 1 day before the migraine started. Patient denies hitting head only hit left thigh. Patient fail down steps.

## 2017-01-19 NOTE — ED Provider Notes (Signed)
[facility] DEPT Provider Note   CSN: [id number] Arrival date & time: 01/19/17  1215     History   Chief Complaint Chief Complaint  Patient presents with  . Migraine    HPI Margaret Lopez is a 47 y.o. female.  Patient reports headache typical of her migraines x 1 week. She has tried her abortive medication without relief. She has consulted with her headache specialist, who trialed an NSAID, still with no relief.  She denies any recent head trauma. She is nauseated, no emesis. Sensitive to light.   The history is provided by the patient. No language interpreter was used.  Migraine  This is a recurrent problem. The current episode started more than 2 days ago. The problem occurs constantly. The problem has not changed since onset.Associated symptoms include headaches.    Past Medical History:  Diagnosis Date  . Anxiety   . Bipolar disorder ([place])   . Depression   . Deviated nasal septum 02/2011  . GERD (gastroesophageal reflux disease)   . Headache(784.0)    migraines  . Nasal turbinate hypertrophy 02/2011   bilat.  . Neuromuscular disorder [facility])    Dr. Jerilynn Mages. Domingo Cocking- does injections around nerve in her head for prevention of migraines - q 3 months   . Pneumonia    hx of   . PONV (postoperative nausea and vomiting)   . Seasonal allergies   . Sleep apnea    uses BIPAP nightly    Patient Active Problem List   Diagnosis Date Noted  . Morbid obesity ([place]) 05/16/2016  . Bipolar 1 disorder, depressed ([place]) 04/16/2013  . Generalized anxiety disorder 04/16/2013    Past Surgical History:  Procedure Laterality Date  . ABDOMINAL HYSTERECTOMY    . CARPAL TUNNEL RELEASE Right   . DIAGNOSTIC LAPAROSCOPY    . FOOT SURGERY Right    bone removed from great toe   . GASTRIC ROUX-EN-Y N/A 05/16/2016   Procedure: LAPAROSCOPIC ROUX-EN-Y GASTRIC BYPASS WITH UPPER ENDOSCOPY;  Surgeon: Clovis Riley, MD;  Location: [facility];  Service: General;  Laterality:  N/A;  . LAPAROSCOPIC VAGINAL HYSTERECTOMY  03/14/2007  . LIPOMA EXCISION Left 08/26/2016   Procedure: EXCISION LIPOMA FROM LEFT UPPER POSTERIOR THIGH;  Surgeon: Clovis Riley, MD;  Location: [place];  Service: General;  Laterality: Left;  . MYRINGOTOMY WITH TUBE PLACEMENT Bilateral 03/02/2015   Procedure: MYRINGOTOMY WITH T-TUBE PLACEMENT;  Surgeon: Leta Baptist, MD;  Location: [place];  Service: ENT;  Laterality: Bilateral;  . NASAL SEPTOPLASTY W/ TURBINOPLASTY  02/22/2011   Procedure: NASAL SEPTOPLASTY WITH TURBINATE REDUCTION;  Surgeon: Ascencion Dike, MD;  Location: [place];  Service: ENT;  Laterality: Bilateral;  . TUBAL LIGATION  12/16/2004    OB History    No data available       Home Medications    Prior to Admission medications   Medication Sig Start Date End Date Taking? Authorizing Provider  acetaminophen (TYLENOL) 500 MG tablet Take 1,000 mg by mouth every 6 (six) hours as needed (for pain/headaches).     [provider]  albuterol (PROVENTIL HFA;VENTOLIN HFA) 108 (90 Base) MCG/ACT inhaler Inhale 1-2 puffs into the lungs every 6 (six) hours as needed for wheezing or shortness of breath.    [provider]  ALPRAZolam Duanne Moron) 0.5 MG tablet Take 1 tablet (0.5 mg total) by mouth at bedtime as needed for anxiety. 09/15/16   Charlcie Cradle, MD  EPINEPHrine 0.3 mg/0.3 mL  IJ SOAJ injection Inject 0.3 mg into the muscle daily as needed (for anaphylatic reactions.).  09/02/15   [provider]  esomeprazole (NEXIUM) 40 MG capsule Take 40 mg by mouth 2 (two) times daily as needed. For acid reflux/indigestion. 08/10/16   [provider]  hydrOXYzine (ATARAX/VISTARIL) 25 MG tablet Take 25 mg by mouth at bedtime as needed for itching.  09/02/15   [provider]  levocetirizine (XYZAL) 5 MG tablet Take 5 mg by mouth at bedtime.     [provider]  lithium 300 MG tablet Take 1 tablet (300 mg total) 2 (two) times daily  by mouth. 11/17/16 11/17/17  Charlcie Cradle, MD  ondansetron (ZOFRAN-ODT) 4 MG disintegrating tablet Take 4 mg by mouth every 6 (six) hours as needed. For nausea/vomiting. 08/03/16   [provider]  valACYclovir (VALTREX) 500 MG tablet Take 500 mg by mouth daily as needed (FOR [employer]ARE [employer]).  08/26/15   [provider]  valproic acid (DEPAKENE) 250 MG capsule TAKE 2 CAPSULES(500 MG) BY MOUTH TWICE DAILY 11/17/16   Charlcie Cradle, MD  zonisamide (ZONEGRAN) 25 MG capsule Take 75 mg by mouth at bedtime.  09/18/15   [provider]    Family History Family History  Problem Relation Age of Onset  . ADD / ADHD Sister   . Alcohol abuse Maternal Grandmother   . Bipolar disorder Maternal Grandmother   . Breast cancer Maternal Grandmother   . Breast cancer Maternal Aunt   . Suicidality Neg Hx     Social History Social History   Tobacco Use  . Smoking status: Former Smoker    Packs/day: 0.50    Years: 4.00    Pack years: 2.00    Types: Cigarettes    Last attempt to quit: 11/12/2015    Years since quitting: 1.1  . Smokeless tobacco: Never Used  Substance Use Topics  . Alcohol use: No    Alcohol/week: 0.0 oz    Comment: once a month or less  . Drug use: No     Allergies   Doxazosin; Morphine and related; Phenergan [promethazine hcl]; Aloe; Penicillins; and Sulfa antibiotics   Review of Systems Review of Systems  Gastrointestinal: Positive for nausea. Negative for vomiting.  Musculoskeletal: Negative for neck stiffness.  Neurological: Positive for headaches.  All other systems reviewed and are negative.    Physical Exam Updated Vital Signs BP 125/81 (BP Location: Right Arm)   Pulse 62   Temp 97.7 F (36.5 C) (Oral)   Resp 18   Ht 5\' 3"  (1.6 m)   Wt 68.9 kg (151 lb 12.8 oz)   SpO2 100%   BMI 26.89 kg/m   Physical Exam  Constitutional: She is oriented to person, place, and time. She appears well-developed and well-nourished.  HENT:  Head:  Atraumatic.  Eyes: Conjunctivae are normal. Pupils are equal, round, and reactive to light.  Neck: Normal range of motion. Neck supple.  Cardiovascular: Normal rate and regular rhythm.  Pulmonary/Chest: Effort normal and breath sounds normal.  Abdominal: Soft. Bowel sounds are normal. There is no tenderness.  Musculoskeletal: Normal range of motion. She exhibits edema.  Neurological: She is alert and oriented to person, place, and time. She has normal strength. No cranial nerve deficit or sensory deficit. GCS eye subscore is 4. GCS verbal subscore is 5. GCS motor subscore is 6.  Skin: Skin is warm and dry.  Psychiatric: She has a normal mood and affect.  Vitals reviewed.    ED  Treatments / Results  Labs (all labs ordered are listed, but only abnormal results are displayed) Labs Reviewed - No data to display  EKG  EKG Interpretation None       Radiology No results found.  Procedures Procedures (including critical care time)  Medications Ordered in ED Medications  sodium chloride 0.9 % bolus 1,000 mL (1,000 mLs Intravenous New Bag/Given 01/19/17 1523)  ketorolac (TORADOL) 30 MG/ML injection 30 mg (30 mg Intravenous Given 01/19/17 1523)  diphenhydrAMINE (BENADRYL) injection 25 mg (25 mg Intravenous Given 01/19/17 1523)  metoCLOPramide (REGLAN) tablet 10 mg (10 mg Oral Given 01/19/17 1523)     Initial Impression / Assessment and Plan / ED Course  I have reviewed the triage vital signs and the nursing notes.  Pertinent labs & imaging results that were available during my care of the patient were reviewed by me and considered in my medical decision making (see chart for details).    Patient feels better after IV fluids and medications. She states her headache persists, but is greatly improved. She would like to be discharged. Pt HA treated and improved while in ED.  Presentation is like pts typical migraine HA and non concerning for [facility], ICH, Meningitis, or temporal arteritis.  Pt is afebrile with no focal neuro deficits, nuchal rigidity, or change in vision. Pt is to follow up with her PCP and/or headache specialist.  Pt verbalizes understanding and is agreeable with plan to dc.   Final Clinical Impressions(s) / ED Diagnoses   Final diagnoses:  Chronic migraine without aura with status migrainosus, not intractable    ED Discharge Orders    None       Etta Quill, NP 01/19/17 1704    Sherwood Gambler, MD 01/20/17 0006

## 2017-01-19 NOTE — Discharge Instructions (Signed)
Follow-up with your headache specialist.

## 2017-01-19 NOTE — ED Triage Notes (Signed)
Pt complaint of continued migraine for a week with associated light sensitivity and nausea.

## 2017-01-20 DIAGNOSIS — R69 Illness, unspecified: Secondary | ICD-10-CM | POA: Diagnosis not present

## 2017-01-20 DIAGNOSIS — R110 Nausea: Secondary | ICD-10-CM | POA: Diagnosis not present

## 2017-01-20 DIAGNOSIS — Z9884 Bariatric surgery status: Secondary | ICD-10-CM | POA: Diagnosis not present

## 2017-01-31 DIAGNOSIS — J0100 Acute maxillary sinusitis, unspecified: Secondary | ICD-10-CM | POA: Diagnosis not present

## 2017-01-31 DIAGNOSIS — J029 Acute pharyngitis, unspecified: Secondary | ICD-10-CM | POA: Diagnosis not present

## 2017-01-31 DIAGNOSIS — F17200 Nicotine dependence, unspecified, uncomplicated: Secondary | ICD-10-CM | POA: Diagnosis not present

## 2017-02-10 DIAGNOSIS — J0100 Acute maxillary sinusitis, unspecified: Secondary | ICD-10-CM | POA: Diagnosis not present

## 2017-02-21 DIAGNOSIS — R05 Cough: Secondary | ICD-10-CM | POA: Diagnosis not present

## 2017-02-21 DIAGNOSIS — J321 Chronic frontal sinusitis: Secondary | ICD-10-CM | POA: Diagnosis not present

## 2017-02-22 DIAGNOSIS — J3089 Other allergic rhinitis: Secondary | ICD-10-CM | POA: Diagnosis not present

## 2017-02-22 DIAGNOSIS — R05 Cough: Secondary | ICD-10-CM | POA: Diagnosis not present

## 2017-02-22 DIAGNOSIS — J301 Allergic rhinitis due to pollen: Secondary | ICD-10-CM | POA: Diagnosis not present

## 2017-02-22 DIAGNOSIS — J209 Acute bronchitis, unspecified: Secondary | ICD-10-CM | POA: Diagnosis not present

## 2017-02-23 ENCOUNTER — Encounter (HOSPITAL_COMMUNITY): Payer: Self-pay | Admitting: Psychiatry

## 2017-02-23 ENCOUNTER — Ambulatory Visit (INDEPENDENT_AMBULATORY_CARE_PROVIDER_SITE_OTHER): Payer: BLUE CROSS/BLUE SHIELD | Admitting: Psychiatry

## 2017-02-23 ENCOUNTER — Ambulatory Visit
Admission: RE | Admit: 2017-02-23 | Discharge: 2017-02-23 | Disposition: A | Discharge status: Home / Self Care | Payer: BLUE CROSS/BLUE SHIELD | Source: Ambulatory Visit | Attending: Allergy | Admitting: Allergy

## 2017-02-23 ENCOUNTER — Other Ambulatory Visit: Payer: Self-pay | Admitting: Allergy

## 2017-02-23 DIAGNOSIS — F319 Bipolar disorder, unspecified: Secondary | ICD-10-CM | POA: Diagnosis not present

## 2017-02-23 DIAGNOSIS — F411 Generalized anxiety disorder: Secondary | ICD-10-CM

## 2017-02-23 DIAGNOSIS — Z87891 Personal history of nicotine dependence: Secondary | ICD-10-CM

## 2017-02-23 DIAGNOSIS — Z818 Family history of other mental and behavioral disorders: Secondary | ICD-10-CM | POA: Diagnosis not present

## 2017-02-23 DIAGNOSIS — Z811 Family history of alcohol abuse and dependence: Secondary | ICD-10-CM

## 2017-02-23 DIAGNOSIS — J209 Acute bronchitis, unspecified: Secondary | ICD-10-CM

## 2017-02-23 DIAGNOSIS — R05 Cough: Secondary | ICD-10-CM | POA: Diagnosis not present

## 2017-02-23 MED ORDER — ALPRAZOLAM 0.5 MG PO TABS: 0.5000 mg | ORAL_TABLET | Freq: Every evening | ORAL | 2 refills | Status: DC | PRN

## 2017-02-23 MED ORDER — VALPROIC ACID 250 MG PO CAPS: ORAL_CAPSULE | ORAL | 3 refills | Status: DC

## 2017-02-23 MED ORDER — LITHIUM CARBONATE 300 MG PO TABS: 300.0000 mg | ORAL_TABLET | Freq: Two times a day (BID) | ORAL | 3 refills | Status: DC

## 2017-02-23 NOTE — Progress Notes (Signed)
[facility] MD/PA/NP OP Progress Note  02/23/2017 8:52 AM Margaret ROTHENBERGER  MRN:  [id number]  Chief Complaint:  Chief Complaint    Anxiety     HPI:  "I feel good. I think I am doing pretty well". She is now taking Depakene 1500mg  daily and plans to get up to 2000mg  soon. Pt is still irritable so she feels she needs to increase Depakote.  She has been having some depression but now realizes it was stressors more than depression. Pt denies anhedonia and crying spells.  As a result of prolonged sinus infection she has not been feeling physically well and is not sleeping well. Pt is working with her PCP and allergist. Pt is aggravated that she can't do what she wants. Pt denies SI/HI.  Pt denies manic and hypomanic symptoms including periods of decreased need for sleep, increased energy, mood lability, impulsivity, FOI, and excessive spending.  Anxiety is pretty well controlled.   Visit Diagnosis:    ICD-10-CM   1. Generalized anxiety disorder F41.1 ALPRAZolam (XANAX) 0.5 MG tablet  2. Bipolar 1 disorder, depressed ([facility]) F31.9 ALPRAZolam (XANAX) 0.5 MG tablet    lithium 300 MG tablet      Past Psychiatric History:  Anxiety: Yes Bipolar Disorder: Yes Depression: Yes Mania: Yes Psychosis: No Schizophrenia: No Personality Disorder: No Hospitalization for psychiatric illness: No History of Electroconvulsive Shock Therapy: No Prior Suicide Attempts: No   Past Medical History:  Past Medical History:  Diagnosis Date  . Anxiety   . Bipolar disorder ([place])   . Depression   . Deviated nasal septum 02/2011  . GERD (gastroesophageal reflux disease)   . Headache(784.0)    migraines  . Nasal turbinate hypertrophy 02/2011   bilat.  . Neuromuscular disorder [facility])    Dr. Jerilynn Mages. Domingo Cocking- does injections around nerve in her head for prevention of migraines - q 3 months   . Pneumonia    hx of   . PONV (postoperative nausea and vomiting)   . Seasonal allergies   . Sleep apnea    uses BIPAP nightly     Past Surgical History:  Procedure Laterality Date  . ABDOMINAL HYSTERECTOMY    . CARPAL TUNNEL RELEASE Right   . DIAGNOSTIC LAPAROSCOPY    . FOOT SURGERY Right    bone removed from great toe   . GASTRIC ROUX-EN-Y N/A 05/16/2016   Procedure: LAPAROSCOPIC ROUX-EN-Y GASTRIC BYPASS WITH UPPER ENDOSCOPY;  Surgeon: Clovis Riley, MD;  [facility];  Service: General;  Laterality: N/A;  . LAPAROSCOPIC VAGINAL HYSTERECTOMY  03/14/2007  . LIPOMA EXCISION Left 08/26/2016   Procedure: EXCISION LIPOMA FROM LEFT UPPER POSTERIOR THIGH;  Surgeon: Clovis Riley, MD;  Location: [place];  Service: General;  Laterality: Left;  . MYRINGOTOMY WITH TUBE PLACEMENT Bilateral 03/02/2015   Procedure: MYRINGOTOMY WITH T-TUBE PLACEMENT;  Surgeon: Leta Baptist, MD;  Location: [place];  Service: ENT;  Laterality: Bilateral;  . NASAL SEPTOPLASTY W/ TURBINOPLASTY  02/22/2011   Procedure: NASAL SEPTOPLASTY WITH TURBINATE REDUCTION;  Surgeon: Ascencion Dike, MD;  Location: [place];  Service: ENT;  Laterality: Bilateral;  . TUBAL LIGATION  12/16/2004    Family Psychiatric History:  Family History  Problem Relation Age of Onset  . ADD / ADHD Sister   . Alcohol abuse Maternal Grandmother   . Bipolar disorder Maternal Grandmother   . Breast cancer Maternal Grandmother   . Breast cancer Maternal Aunt   . Suicidality Neg Hx  Social History:  Social History   Socioeconomic History  . Marital status: Legally Separated    Spouse name: None  . Number of children: None  . Years of education: None  . Highest education level: None  Social Needs  . Financial resource strain: None  . Food insecurity - worry: None  . Food insecurity - inability: None  . Transportation needs - medical: None  . Transportation needs - non-medical: None  Occupational History  . None  Tobacco Use  . Smoking status: Former Smoker    Packs/day: 0.50    Years: 4.00    Pack years: 2.00    Types: Cigarettes     Last attempt to quit: 11/12/2015    Years since quitting: 1.2  . Smokeless tobacco: Never Used  Substance and Sexual Activity  . Alcohol use: No    Alcohol/week: 0.0 oz    Comment: once a month or less  . Drug use: No  . Sexual activity: Yes    Partners: Male    Birth control/protection: Surgical    Comment: Hysterectomy  Other Topics Concern  . None  Social History Narrative  . None    Allergies:  Allergies  Allergen Reactions  . Doxazosin Nausea And Vomiting  . Morphine And Related Itching  . Phenergan [Promethazine Hcl]     " knocks out for several hours"  . Aloe Hives  . Penicillins Rash    Has patient had a PCN reaction causing immediate rash, facial/tongue/throat swelling, SOB or lightheadedness with hypotension: No Has patient had a PCN reaction causing severe rash involving mucus membranes or skin necrosis: No Has patient had a PCN reaction that required hospitalization No Has patient had a PCN reaction occurring within the last 10 years: No If all of the above answers are "NO", then may proceed with Cephalosporin use.   . Sulfa Antibiotics Rash    Metabolic Disorder Labs: No results found for: HGBA1C, MPG No results found for: PROLACTIN No results found for: CHOL, TRIG, HDL, CHOLHDL, VLDL, LDLCALC No results found for: TSH  Therapeutic Level Labs: No results found for: LITHIUM No results found for: VALPROATE No components found for:  CBMZ  Current Medications: Current Outpatient Medications  Medication Sig Dispense Refill  . acetaminophen (TYLENOL) 500 MG tablet Take 1,000 mg by mouth every 6 (six) hours as needed (for pain/headaches).     [name] albuterol (PROVENTIL HFA;VENTOLIN HFA) 108 (90 Base) MCG/ACT inhaler Inhale 1-2 puffs into the lungs every 6 (six) hours as needed for wheezing or shortness of breath.    . ALPRAZolam (XANAX) 0.5 MG tablet Take 1 tablet (0.5 mg total) by mouth at bedtime as needed for anxiety. 30 tablet 1  . cyclobenzaprine  (FLEXERIL) 10 MG tablet Take 5-10 mg by mouth at bedtime as needed. TMJ  2  . EPINEPHrine 0.3 mg/0.3 mL IJ SOAJ injection Inject 0.3 mg into the muscle daily as needed (for anaphylatic reactions.).   1  . esomeprazole (NEXIUM) 40 MG capsule Take 40 mg by mouth 2 (two) times daily as needed. For acid reflux/indigestion.  6  . hydrOXYzine (ATARAX/VISTARIL) 25 MG tablet Take 25 mg by mouth at bedtime as needed for itching.   3  . ipratropium (ATROVENT) 0.06 % nasal spray Place 2 sprays into both nostrils daily as needed. Allergies, runny nose.  4  . levocetirizine (XYZAL) 5 MG tablet Take 5 mg by mouth at bedtime.     [name] lithium 300 MG tablet Take 1 tablet (300  mg total) 2 (two) times daily by mouth. 60 tablet 3  . Multiple Vitamins-Minerals (OPURITY BYPASS OPTIMIZED) CHEW Chew 1 tablet by mouth daily.    . ondansetron (ZOFRAN-ODT) 4 MG disintegrating tablet Take 4 mg by mouth every 6 (six) hours as needed for nausea or vomiting.   0  . valACYclovir (VALTREX) 500 MG tablet Take 500 mg by mouth See admin instructions. Takes 500mg  daily for 5 days when she has a flare up.  3  . valproic acid (DEPAKENE) 250 MG capsule TAKE 2 CAPSULES(500 MG) BY MOUTH TWICE DAILY (Patient taking differently: Take 500 mg by mouth at bedtime. Working up to taking 2 CAPSULES(500 MG) BY MOUTH TWICE DAILY) 120 capsule 3  . zonisamide (ZONEGRAN) 25 MG capsule Take 50 mg by mouth at bedtime. Working up to 3 tablets at bedtime  2  . diclofenac (CATAFLAM) 50 MG tablet Take 50 mg by mouth See admin instructions. Take 50mg  twice a day for 3 days to break headache cycle.  1   No current facility-administered medications for this visit.      Musculoskeletal: Strength & Muscle Tone: within normal limits Gait & Station: normal Patient leans: N/A  Psychiatric Specialty Exam: Review of Systems  Constitutional: Positive for malaise/fatigue. Negative for chills and fever.  HENT: Positive for congestion, ear pain, sinus pain and sore  throat. Negative for tinnitus.   Neurological: Negative for weakness.    Blood pressure 105/72, pulse 76, height 5\' 3"  (1.6 m), weight 146 lb 12.8 oz (66.6 kg).Body mass index is 26 kg/m.  General Appearance: Fairly Groomed  Eye Contact:  Good  Speech:  Clear and Coherent and Normal Rate  Volume:  Normal  Mood:  Euthymic  Affect:  Full Range  Thought Process:  Goal Directed and Descriptions of Associations: Intact  Orientation:  Full (Time, Place, and Person)  Thought Content: Logical   Suicidal Thoughts:  No  Homicidal Thoughts:  No  Memory:  Immediate;   Good Recent;   Good Remote;   Good  Judgement:  Good  Insight:  Good  Psychomotor Activity:  Normal  Concentration:  Concentration: Good and Attention Span: Good  Recall:  Good  Fund of Knowledge: Good  Language: Good  Akathisia:  No  Handed:  Right  AIMS (if indicated): not done  Assets:  Communication Skills Desire for Improvement Financial Resources/Insurance Housing Transportation Vocational/Educational  ADL's:  Intact  Cognition: WNL  Sleep:  Good   Screenings: PHQ2-9     Nutrition [facility]/12/2016 [facility] Nutrition from 05/09/2016 in [facility] Nutrition from 02/05/2016 in [facility]  PHQ-2 Total Score  0  0  0     I reviewed the information below on 02/23/17 and agree except where noted.   Assessment and Plan: Bipolar I disorder; GAD    Medication management with supportive therapy. Risks/benefits and SE of the medication discussed. Pt verbalized understanding and verbal consent obtained for treatment.  Affirm with the patient that the medications are taken as ordered. Patient expressed understanding of how their medications were to be used.    Meds:  Lithium 300mg  po BID for Bipolar disorder Xanax 0.5mg  po qD prn anxiety Depakene 500mg  po BID for Bipolar disorder     Labs: 08/24/2016 CBC and BMP WNL    Therapy: brief supportive therapy provided. Discussed psychosocial stressors in detail.       Consultations: none   Pt denies SI and is  at an acute low risk for suicide. Patient told to call clinic if any problems occur. Patient advised to go to ER if they should develop SI/HI, side effects, or if symptoms worsen. Has crisis numbers to call if needed. Pt verbalized understanding.   F/up in 3 months or sooner if needed     Charlcie Cradle, MD 02/23/2017, 8:52 AM

## 2017-03-01 ENCOUNTER — Encounter: Payer: BLUE CROSS/BLUE SHIELD | Attending: Surgery | Admitting: Skilled Nursing Facility1

## 2017-03-01 ENCOUNTER — Encounter: Payer: Self-pay | Admitting: Skilled Nursing Facility1

## 2017-03-01 DIAGNOSIS — Z713 Dietary counseling and surveillance: Secondary | ICD-10-CM | POA: Diagnosis not present

## 2017-03-01 DIAGNOSIS — J301 Allergic rhinitis due to pollen: Secondary | ICD-10-CM | POA: Diagnosis not present

## 2017-03-01 NOTE — Progress Notes (Signed)
Follow-up visit:  8 Weeks Post-Operative RYGB Surgery  Medical Nutrition Therapy:  Appt start time: 3:20 end time:  4:11.  Primary concerns today: Post-operative Bariatric Surgery Nutrition Management.  Non scale victories: more active  Pt states fat and sugar make her really naueasus.   Pt states she had one severe mirgrain with the weather change. Pt states she is constipated: was on antibiotics.  Pt states she had diarrhea after hamburger with BBQ sauce and salad.  Pt states she had a sinus infection and bronchitis. Pt states she feels like she is over her nausea hump since eating less fat.   Surgery date: 05/16/2016 Surgery type: RYGB Start weight at [facility]: 227.3 lbs 02/05/2016 Weight today: 146.8 lbs Weight change: 4.2 Weight loss goal: less than 150 lbs, learn to eat healthier foods, be more active/present with family  TANITA  BODY COMP RESULTS  07/12/2016 09/14/2016 10/10/2016 11/16/2016 01/19/2016 03/01/2017   BMI (kg/m^2) 35.0 31.4 30.6 28.4 26.9 26   Fat Mass (lbs) 87.4 70.4 63 57 51.4 48.2   Fat Free Mass (lbs) 110.4 107 110 103.6 100.4 98.6   Total Body Water (lbs) 79.4 76 78 73 70.6 68.8    24-hr recall: going long period of times without eating B (AM): regular coffee Snk (AM):half jimmy dean and bacon and egg no biscuit Decaf coffee L (PM): meat and sauteed vegetables  Snk (PM): decaf coffee or propel zero 4pm: yogurt D (PM): grilled chicken 1.5 ounces with salad Snk (PM): chocolate protein shake   Fluid intake: decaff coffee, 1 regular coffee: 50 ounces of coffee  Estimated total protein intake: 50g  Medications: See list Supplementation: opurity once a day and tums 3 times a day  Using straws: no Drinking while eating: no Having you been chewing well: yes Chewing/swallowing difficulties: once Changes in vision: no Changes to mood/headaches: no Hair loss/Changes to skin/Changes to nails: no Any difficulty focusing or concentrating: no Sweating:  no Dizziness/Lightheaded: no Palpitations: no  Carbonated beverages: no N/V/D/C/GAS: some constipation  Abdominal Pain: no Dumping syndrome: no  Recent physical activity:  Walking 20-30 min, 6x/week  Progress Towards Goal(s):  In progress.   Nutritional Diagnosis:  Inadequate fluid intake As related to bariatric surgery post-op recommendations.  As evidenced by dietary recall of less than 64 oz of fluid daily. .    Intervention:  Nutrition education and counseling. Goals: -Try one ECU cup full of clear liquid like sugar free stuff -Try half a banana with your breakfast   Teaching Method Utilized:  Visual Auditory Hands on  Barriers to learning/adherence to lifestyle change: none  Demonstrated degree of understanding via:  Teach Back   Monitoring/Evaluation:  Dietary intake, exercise, and body weight.

## 2017-03-02 DIAGNOSIS — J3081 Allergic rhinitis due to animal (cat) (dog) hair and dander: Secondary | ICD-10-CM | POA: Diagnosis not present

## 2017-03-02 DIAGNOSIS — J3089 Other allergic rhinitis: Secondary | ICD-10-CM | POA: Diagnosis not present

## 2017-03-15 DIAGNOSIS — J3081 Allergic rhinitis due to animal (cat) (dog) hair and dander: Secondary | ICD-10-CM | POA: Diagnosis not present

## 2017-03-15 DIAGNOSIS — J3089 Other allergic rhinitis: Secondary | ICD-10-CM | POA: Diagnosis not present

## 2017-03-15 DIAGNOSIS — J301 Allergic rhinitis due to pollen: Secondary | ICD-10-CM | POA: Diagnosis not present

## 2017-03-17 DIAGNOSIS — J3081 Allergic rhinitis due to animal (cat) (dog) hair and dander: Secondary | ICD-10-CM | POA: Diagnosis not present

## 2017-03-17 DIAGNOSIS — J3089 Other allergic rhinitis: Secondary | ICD-10-CM | POA: Diagnosis not present

## 2017-03-17 DIAGNOSIS — J301 Allergic rhinitis due to pollen: Secondary | ICD-10-CM | POA: Diagnosis not present

## 2017-03-20 DIAGNOSIS — J301 Allergic rhinitis due to pollen: Secondary | ICD-10-CM | POA: Diagnosis not present

## 2017-03-20 DIAGNOSIS — J3089 Other allergic rhinitis: Secondary | ICD-10-CM | POA: Diagnosis not present

## 2017-03-20 DIAGNOSIS — J3081 Allergic rhinitis due to animal (cat) (dog) hair and dander: Secondary | ICD-10-CM | POA: Diagnosis not present

## 2017-03-29 DIAGNOSIS — J3089 Other allergic rhinitis: Secondary | ICD-10-CM | POA: Diagnosis not present

## 2017-03-29 DIAGNOSIS — J301 Allergic rhinitis due to pollen: Secondary | ICD-10-CM | POA: Diagnosis not present

## 2017-03-29 DIAGNOSIS — J3081 Allergic rhinitis due to animal (cat) (dog) hair and dander: Secondary | ICD-10-CM | POA: Diagnosis not present

## 2017-04-04 DIAGNOSIS — J3089 Other allergic rhinitis: Secondary | ICD-10-CM | POA: Diagnosis not present

## 2017-04-04 DIAGNOSIS — J301 Allergic rhinitis due to pollen: Secondary | ICD-10-CM | POA: Diagnosis not present

## 2017-04-04 DIAGNOSIS — J3081 Allergic rhinitis due to animal (cat) (dog) hair and dander: Secondary | ICD-10-CM | POA: Diagnosis not present

## 2017-04-05 DIAGNOSIS — G43019 Migraine without aura, intractable, without status migrainosus: Secondary | ICD-10-CM | POA: Diagnosis not present

## 2017-04-05 DIAGNOSIS — G518 Other disorders of facial nerve: Secondary | ICD-10-CM | POA: Diagnosis not present

## 2017-04-05 DIAGNOSIS — M542 Cervicalgia: Secondary | ICD-10-CM | POA: Diagnosis not present

## 2017-04-05 DIAGNOSIS — G4482 Headache associated with sexual activity: Secondary | ICD-10-CM | POA: Diagnosis not present

## 2017-04-05 DIAGNOSIS — M791 Myalgia, unspecified site: Secondary | ICD-10-CM | POA: Diagnosis not present

## 2017-04-11 DIAGNOSIS — H6983 Other specified disorders of Eustachian tube, bilateral: Secondary | ICD-10-CM | POA: Diagnosis not present

## 2017-04-11 DIAGNOSIS — H6123 Impacted cerumen, bilateral: Secondary | ICD-10-CM | POA: Diagnosis not present

## 2017-04-11 DIAGNOSIS — H7203 Central perforation of tympanic membrane, bilateral: Secondary | ICD-10-CM | POA: Diagnosis not present

## 2017-04-12 DIAGNOSIS — J3081 Allergic rhinitis due to animal (cat) (dog) hair and dander: Secondary | ICD-10-CM | POA: Diagnosis not present

## 2017-04-12 DIAGNOSIS — J301 Allergic rhinitis due to pollen: Secondary | ICD-10-CM | POA: Diagnosis not present

## 2017-04-12 DIAGNOSIS — J3089 Other allergic rhinitis: Secondary | ICD-10-CM | POA: Diagnosis not present

## 2017-04-18 DIAGNOSIS — J3089 Other allergic rhinitis: Secondary | ICD-10-CM | POA: Diagnosis not present

## 2017-04-18 DIAGNOSIS — J3081 Allergic rhinitis due to animal (cat) (dog) hair and dander: Secondary | ICD-10-CM | POA: Diagnosis not present

## 2017-04-18 DIAGNOSIS — J301 Allergic rhinitis due to pollen: Secondary | ICD-10-CM | POA: Diagnosis not present

## 2017-04-21 DIAGNOSIS — J3089 Other allergic rhinitis: Secondary | ICD-10-CM | POA: Diagnosis not present

## 2017-04-21 DIAGNOSIS — J3081 Allergic rhinitis due to animal (cat) (dog) hair and dander: Secondary | ICD-10-CM | POA: Diagnosis not present

## 2017-04-21 DIAGNOSIS — J301 Allergic rhinitis due to pollen: Secondary | ICD-10-CM | POA: Diagnosis not present

## 2017-05-05 DIAGNOSIS — J3089 Other allergic rhinitis: Secondary | ICD-10-CM | POA: Diagnosis not present

## 2017-05-05 DIAGNOSIS — J3081 Allergic rhinitis due to animal (cat) (dog) hair and dander: Secondary | ICD-10-CM | POA: Diagnosis not present

## 2017-05-05 DIAGNOSIS — J301 Allergic rhinitis due to pollen: Secondary | ICD-10-CM | POA: Diagnosis not present

## 2017-05-08 DIAGNOSIS — J301 Allergic rhinitis due to pollen: Secondary | ICD-10-CM | POA: Diagnosis not present

## 2017-05-08 DIAGNOSIS — J3081 Allergic rhinitis due to animal (cat) (dog) hair and dander: Secondary | ICD-10-CM | POA: Diagnosis not present

## 2017-05-08 DIAGNOSIS — J3089 Other allergic rhinitis: Secondary | ICD-10-CM | POA: Diagnosis not present

## 2017-05-11 DIAGNOSIS — J3081 Allergic rhinitis due to animal (cat) (dog) hair and dander: Secondary | ICD-10-CM | POA: Diagnosis not present

## 2017-05-11 DIAGNOSIS — J3089 Other allergic rhinitis: Secondary | ICD-10-CM | POA: Diagnosis not present

## 2017-05-11 DIAGNOSIS — J301 Allergic rhinitis due to pollen: Secondary | ICD-10-CM | POA: Diagnosis not present

## 2017-05-24 DIAGNOSIS — Z9884 Bariatric surgery status: Secondary | ICD-10-CM | POA: Diagnosis not present

## 2017-05-24 DIAGNOSIS — J3081 Allergic rhinitis due to animal (cat) (dog) hair and dander: Secondary | ICD-10-CM | POA: Diagnosis not present

## 2017-05-24 DIAGNOSIS — R110 Nausea: Secondary | ICD-10-CM | POA: Diagnosis not present

## 2017-05-24 DIAGNOSIS — J3089 Other allergic rhinitis: Secondary | ICD-10-CM | POA: Diagnosis not present

## 2017-05-24 DIAGNOSIS — J301 Allergic rhinitis due to pollen: Secondary | ICD-10-CM | POA: Diagnosis not present

## 2017-05-24 DIAGNOSIS — K5900 Constipation, unspecified: Secondary | ICD-10-CM | POA: Diagnosis not present

## 2017-05-25 ENCOUNTER — Ambulatory Visit (INDEPENDENT_AMBULATORY_CARE_PROVIDER_SITE_OTHER): Payer: BLUE CROSS/BLUE SHIELD | Admitting: Psychiatry

## 2017-05-25 ENCOUNTER — Encounter (HOSPITAL_COMMUNITY): Payer: Self-pay | Admitting: Psychiatry

## 2017-05-25 VITALS — BP 103/66 | HR 65 | Ht 63.6 in | Wt 140.0 lb

## 2017-05-25 DIAGNOSIS — Z5181 Encounter for therapeutic drug level monitoring: Secondary | ICD-10-CM | POA: Diagnosis not present

## 2017-05-25 DIAGNOSIS — Z87891 Personal history of nicotine dependence: Secondary | ICD-10-CM | POA: Diagnosis not present

## 2017-05-25 DIAGNOSIS — F319 Bipolar disorder, unspecified: Secondary | ICD-10-CM | POA: Diagnosis not present

## 2017-05-25 DIAGNOSIS — Z818 Family history of other mental and behavioral disorders: Secondary | ICD-10-CM | POA: Diagnosis not present

## 2017-05-25 DIAGNOSIS — R450 Nervousness: Secondary | ICD-10-CM

## 2017-05-25 DIAGNOSIS — Z811 Family history of alcohol abuse and dependence: Secondary | ICD-10-CM | POA: Diagnosis not present

## 2017-05-25 DIAGNOSIS — F411 Generalized anxiety disorder: Secondary | ICD-10-CM | POA: Diagnosis not present

## 2017-05-25 MED ORDER — ALPRAZOLAM 0.5 MG PO TABS: 0.5000 mg | ORAL_TABLET | Freq: Every evening | ORAL | 1 refills | Status: DC | PRN

## 2017-05-25 MED ORDER — DIVALPROEX SODIUM ER 500 MG PO TB24: 500.0000 mg | ORAL_TABLET | Freq: Every day | ORAL | 1 refills | Status: DC

## 2017-05-25 MED ORDER — LITHIUM CARBONATE 300 MG PO TABS: 300.0000 mg | ORAL_TABLET | Freq: Two times a day (BID) | ORAL | 1 refills | Status: DC

## 2017-05-25 MED ORDER — DIVALPROEX SODIUM ER 250 MG PO TB24: 250.0000 mg | ORAL_TABLET | Freq: Every day | ORAL | 1 refills | Status: DC

## 2017-05-25 NOTE — Progress Notes (Signed)
BH MD/PA/NP OP Progress Note  05/25/2017 9:09 AM Margaret Lopez  MRN:  [id number]  Chief Complaint:  Chief Complaint    Anxiety; Follow-up     HPI: Pt was not able to take Depakene BID due to fatigue. Pt went to her surgeon yesterday who cleared the patient to restart ER meds. Pt states she has been struggling and "spiriling". She states she gets hung up or stuck on situations where she is think about the topic over and over for hours or even a day. She feels anxious, angry and causes insomnia. She feels her response is out of proportion to the general population. It happens several times over the last month. Pt thinks it is due to her meds not being at correct dose. Pt takes 1/4 of tablet about 3x/week. She takes Vistaril 12.5mg  the other nights of the week for sleep. Most nights she gets about 6-7 hrs/night  Pt she hasn't been very depressed lately. She denies crying spells. She has been going out more and trying to find more interesting things to do. Pt denies SI/HI. Pt denies recent manic and hypomanic symptoms including periods of decreased need for sleep, increased energy, mood lability, impulsivity, FOI, and excessive spending.   Pt states-taking meds as prescribed and denies SE.   Visit Diagnosis:    ICD-10-CM   1. Bipolar 1 disorder, depressed (HCC) F31.9 ALPRAZolam (XANAX) 0.5 MG tablet    lithium 300 MG tablet    divalproex (DEPAKOTE ER) 500 MG 24 hr tablet    divalproex (DEPAKOTE ER) 250 MG 24 hr tablet  2. Generalized anxiety disorder F41.1 ALPRAZolam (XANAX) 0.5 MG tablet  3. Encounter for therapeutic drug level monitoring Z51.81 Valproic acid level    Lithium level      Past Psychiatric History:  Anxiety: Yes Bipolar Disorder: Yes Depression: Yes Mania: Yes Psychosis: No Schizophrenia: No Personality Disorder: No Hospitalization for psychiatric illness: No History of Electroconvulsive Shock Therapy: No Prior Suicide Attempts: No   Past Medical History:  Past  Medical History:  Diagnosis Date  . Anxiety   . Bipolar disorder ([place])   . Depression   . Deviated nasal septum 02/2011  . GERD (gastroesophageal reflux disease)   . Headache(784.0)    migraines  . Nasal turbinate hypertrophy 02/2011   bilat.  . Neuromuscular disorder [facility])    Dr. Jerilynn Mages. Domingo Cocking- does injections around nerve in her head for prevention of migraines - q 3 months   . Pneumonia    hx of   . PONV (postoperative nausea and vomiting)   . Seasonal allergies   . Sleep apnea    uses BIPAP nightly    Past Surgical History:  Procedure Laterality Date  . ABDOMINAL HYSTERECTOMY    . CARPAL TUNNEL RELEASE Right   . DIAGNOSTIC LAPAROSCOPY    . FOOT SURGERY Right    bone removed from great toe   . GASTRIC ROUX-EN-Y N/A 05/16/2016   Procedure: LAPAROSCOPIC ROUX-EN-Y GASTRIC BYPASS WITH UPPER ENDOSCOPY;  Surgeon: Clovis Riley, MD;  Location: [facility];  Service: General;  Laterality: N/A;  . LAPAROSCOPIC VAGINAL HYSTERECTOMY  03/14/2007  . LIPOMA EXCISION Left 08/26/2016   Procedure: EXCISION LIPOMA FROM LEFT UPPER POSTERIOR THIGH;  Surgeon: Clovis Riley, MD;  Location: [facility];  Service: General;  Laterality: Left;  . MYRINGOTOMY WITH TUBE PLACEMENT Bilateral 03/02/2015   Procedure: MYRINGOTOMY WITH T-TUBE PLACEMENT;  Surgeon: Leta Baptist, MD;  Location: [facility];  Service: ENT;  Laterality: Bilateral;  .  NASAL SEPTOPLASTY W/ TURBINOPLASTY  02/22/2011   Procedure: NASAL SEPTOPLASTY WITH TURBINATE REDUCTION;  Surgeon: Ascencion Dike, MD;  Location: [facility];  Service: ENT;  Laterality: Bilateral;  . TUBAL LIGATION  12/16/2004    Family Psychiatric History:  Family History  Problem Relation Age of Onset  . ADD / ADHD Sister   . Alcohol abuse Maternal Grandmother   . Bipolar disorder Maternal Grandmother   . Breast cancer Maternal Grandmother   . Breast cancer Maternal Aunt   . Suicidality Neg Hx     Social History:  Social History   Socioeconomic  History  . Marital status: Legally Separated    Spouse name: Not on file  . Number of children: Not on file  . Years of education: Not on file  . Highest education level: Not on file  Occupational History  . Not on file  Social Needs  . Financial resource strain: Not on file  . Food insecurity:    Worry: Not on file    Inability: Not on file  . Transportation needs:    Medical: Not on file    Non-medical: Not on file  Tobacco Use  . Smoking status: Former Smoker    Packs/day: 0.50    Years: 4.00    Pack years: 2.00    Types: Cigarettes    Last attempt to quit: 11/12/2015    Years since quitting: 1.5  . Smokeless tobacco: Never Used  Substance and Sexual Activity  . Alcohol use: No    Alcohol/week: 0.0 oz    Comment: once a month or less  . Drug use: No  . Sexual activity: Yes    Partners: Male    Birth control/protection: Surgical    Comment: Hysterectomy  Lifestyle  . Physical activity:    Days per week: 5 days    Minutes per session: 40 min  . Stress: Only a little  Relationships  . Social connections:    Talks on phone: Not on file    Gets together: Not on file    Attends religious service: Not on file    Active member of club or organization: Not on file    Attends meetings of clubs or organizations: Not on file    Relationship status: Not on file  Other Topics Concern  . Not on file  Social History Narrative  . Not on file    Allergies:  Allergies  Allergen Reactions  . Doxazosin Nausea And Vomiting  . Morphine And Related Itching  . Phenergan [Promethazine Hcl]     " knocks out for several hours"  . Aloe Hives  . Penicillins Rash    Has patient had a PCN reaction causing immediate rash, facial/tongue/throat swelling, SOB or lightheadedness with hypotension: No Has patient had a PCN reaction causing severe rash involving mucus membranes or skin necrosis: No Has patient had a PCN reaction that required hospitalization No Has patient had a PCN  reaction occurring within the last 10 years: No If all of the above answers are "NO", then may proceed with Cephalosporin use.   . Sulfa Antibiotics Rash    Metabolic Disorder Labs: No results found for: HGBA1C, MPG No results found for: PROLACTIN No results found for: CHOL, TRIG, HDL, CHOLHDL, VLDL, LDLCALC No results found for: TSH  Therapeutic Level Labs: No results found for: LITHIUM No results found for: VALPROATE No components found for:  CBMZ  Current Medications: Current Outpatient Medications  Medication Sig Dispense Refill  .  EPINEPHrine 0.3 mg/0.3 mL IJ SOAJ injection Inject 0.3 mg into the muscle daily as needed (for anaphylatic reactions.).   1  . esomeprazole (NEXIUM) 40 MG capsule Take 40 mg by mouth 2 (two) times daily as needed. For acid reflux/indigestion.  6  . hydrOXYzine (ATARAX/VISTARIL) 25 MG tablet Take 25 mg by mouth at bedtime as needed for itching.   3  . ipratropium (ATROVENT) 0.06 % nasal spray Place 2 sprays into both nostrils daily as needed. Allergies, runny nose.  4  . levocetirizine (XYZAL) 5 MG tablet Take 5 mg by mouth at bedtime.     [name] lithium 300 MG tablet Take 1 tablet (300 mg total) by mouth 2 (two) times daily. 60 tablet 1  . Multiple Vitamins-Minerals (OPURITY BYPASS OPTIMIZED) CHEW Chew 1 tablet by mouth daily.    . ondansetron (ZOFRAN-ODT) 4 MG disintegrating tablet Take 4 mg by mouth every 6 (six) hours as needed for nausea or vomiting.   0  . valACYclovir (VALTREX) 500 MG tablet Take 500 mg by mouth See admin instructions. Takes 500mg  daily for 5 days when she has a flare up.  3  . zonisamide (ZONEGRAN) 25 MG capsule Take 50 mg by mouth at bedtime. Working up to 3 tablets at bedtime  2  . acetaminophen (TYLENOL) 500 MG tablet Take 1,000 mg by mouth every 6 (six) hours as needed (for pain/headaches).     [name] albuterol (PROVENTIL HFA;VENTOLIN HFA) 108 (90 Base) MCG/ACT inhaler Inhale 1-2 puffs into the lungs every 6 (six) hours as needed for  wheezing or shortness of breath.    . ALPRAZolam (XANAX) 0.5 MG tablet Take 1 tablet (0.5 mg total) by mouth at bedtime as needed for anxiety. 30 tablet 1  . cyclobenzaprine (FLEXERIL) 10 MG tablet Take 5-10 mg by mouth at bedtime as needed. TMJ  2  . diclofenac (CATAFLAM) 50 MG tablet Take 50 mg by mouth See admin instructions. Take 50mg  twice a day for 3 days to break headache cycle.  1  . divalproex (DEPAKOTE ER) 250 MG 24 hr tablet Take 1 tablet (250 mg total) by mouth daily. 30 tablet 1  . divalproex (DEPAKOTE ER) 500 MG 24 hr tablet Take 1 tablet (500 mg total) by mouth daily. 30 tablet 1   No current facility-administered medications for this visit.      Musculoskeletal: Strength & Muscle Tone: within normal limits Gait & Station: normal Patient leans: N/A  Psychiatric Specialty Exam: Review of Systems  Constitutional: Positive for weight loss. Negative for chills and fever.  Psychiatric/Behavioral: Negative for depression, hallucinations and suicidal ideas. The patient is nervous/anxious. The patient does not have insomnia.     Blood pressure 103/66, pulse 65, height 5' 3.6" (1.615 m), weight 140 lb (63.5 kg), SpO2 100 %.Body mass index is 24.33 kg/m.  General Appearance: Casual  Eye Contact:  Good  Speech:  Clear and Coherent and Normal Rate  Volume:  Normal  Mood:  Anxious  Affect:  Congruent  Thought Process:  Goal Directed and Descriptions of Associations: Intact  Orientation:  Full (Time, Place, and Person)  Thought Content: Logical   Suicidal Thoughts:  No  Homicidal Thoughts:  No  Memory:  Immediate;   Good Recent;   Good Remote;   Good  Judgement:  Good  Insight:  Good  Psychomotor Activity:  Normal  Concentration:  Concentration: Good and Attention Span: Good  Recall:  Good  Fund of Knowledge: Good  Language: Good  Akathisia:  No  Handed:  Right  AIMS (if indicated): not done  Assets:  Communication Skills Desire for Improvement Housing Leisure  Time Physical Health Transportation Vocational/Educational  ADL's:  Intact  Cognition: WNL  Sleep:  Fair   Screenings: PHQ2-9     [facility] Nutrition [facility] 05/09/2016 [facility] Nutrition from 02/05/2016 in [facility]  PHQ-2 Total Score  0  0  0       Assessment and Plan: Bipolar I disorder; GAD    Medication management with supportive therapy. Risks and benefits, side effects and alternative treatment options discussed with patient. Pt was given an opportunity to ask questions about medication, illness, and treatment. All current psychiatric medications have been reviewed and discussed with the patient and adjusted as clinically appropriate. The patient has been provided an accurate and updated list of the medications being now prescribed. Patient expressed understanding of how their medications were to be used.  Pt verbalized understanding and verbal consent obtained for treatment.  The risk of un-intended pregnancy is low based on the fact that pt reports she had a hysterecomy. Pt is aware that these meds carry a teratogenic risk. Pt will discuss plan of action if she does or plans to become pregnant in the future.  Status of current problems: ongoing  Meds: Lithium 300mg  po BID for Bipolar disorder Xanax 0.5mg  po qD prn anxiety Change Depakene to Depakote ER 750mg  po qD for  Bipolar disorder   Labs: ordered Lithium and Depakote level  Therapy: brief supportive therapy provided. Discussed psychosocial stressors in detail.     Consultations: none  Pt denies SI and is at an acute low risk for suicide. Patient told to call clinic if any problems occur. Patient advised to go to ER if they should develop SI/HI, side effects, or if symptoms worsen. Has crisis numbers to call if needed. Pt verbalized understanding.  F/up in 2 months or sooner if needed  The  duration of this appointment visit was 30 minutes of face-to-face time with the patient.  Greater than 50% of this time was spent in counseling, explanation of  diagnosis, planning of further management, and coordination of care   Charlcie Cradle, MD 05/25/2017, 9:09 AM

## 2017-05-31 DIAGNOSIS — J3081 Allergic rhinitis due to animal (cat) (dog) hair and dander: Secondary | ICD-10-CM | POA: Diagnosis not present

## 2017-05-31 DIAGNOSIS — J3089 Other allergic rhinitis: Secondary | ICD-10-CM | POA: Diagnosis not present

## 2017-05-31 DIAGNOSIS — J301 Allergic rhinitis due to pollen: Secondary | ICD-10-CM | POA: Diagnosis not present

## 2017-06-01 ENCOUNTER — Encounter: Payer: BLUE CROSS/BLUE SHIELD | Attending: Surgery | Admitting: Skilled Nursing Facility1

## 2017-06-01 ENCOUNTER — Encounter: Payer: Self-pay | Admitting: Skilled Nursing Facility1

## 2017-06-01 DIAGNOSIS — Z713 Dietary counseling and surveillance: Secondary | ICD-10-CM | POA: Insufficient documentation

## 2017-06-01 DIAGNOSIS — Z9884 Bariatric surgery status: Secondary | ICD-10-CM | POA: Diagnosis not present

## 2017-06-01 NOTE — Progress Notes (Signed)
Post-Operative RYGB Surgery  Medical Nutrition Therapy:  Appt start time: 3:20 end time:  4:11.  Primary concerns today: Post-operative Bariatric Surgery Nutrition Management.  Non scale victories: more active  Pt states fat and sugar make her really naueasus.   Pt states she had one severe mirgrain with the weather change. Pt states she is constipated: was on antibiotics.  Pt states she had diarrhea after hamburger with BBQ sauce and salad.  Pt states she had a sinus infection and bronchitis. Pt states she feels like she is over her nausea hump since eating less fat. Pt states she takes 4-6 tums throughout the day for "acid tummy". Pt states if she eats every 2 hours she feels better stating it is difficult to recuperate when she had a day she did not eat enough.   Pt states she has been feeling tired. Pt states every time she eats a hamburger with the bun she throws up. Pt sates when she works at home it is harder to eat throughout the day. Pt states she sleeps about 7 hours a night still feeling tired when she wakes up. Pt became to cry when describing how tired she is. Pt states she will be getting labs taken in 1 week stating she was waiting because she has not taken her vitamin in 3 days: Dietitian educated the pt on this not needing to be done.   Surgery date: 05/16/2016 Surgery type: RYGB Start weight at [facility]: 227.3 lbs 02/05/2016 Weight today: 139.2 Weight loss goal: less than 150 lbs, learn to eat healthier foods, be more active/present with family  TANITA  BODY COMP RESULTS  07/12/2016 09/14/2016 10/10/2016 11/16/2016 01/19/2016 03/01/2017 06/01/2017   BMI (kg/m^2) 35.0 31.4 30.6 28.4 26.9 26 24.7   Fat Mass (lbs) 87.4 70.4 63 57 51.4 48.2 35.4   Fat Free Mass (lbs) 110.4 107 110 103.6 100.4 98.6 103.8   Total Body Water (lbs) 79.4 76 78 73 70.6 68.8 72.4    24-hr recall: eating every 2-3 hours; 1 day a week not   B (AM): regular coffee chic fila biscuit with no bread with  banana  Snk (AM):  Decaf coffee L (PM): steak mushrooms and vegetables Snk (PM): decaf coffee or propel zero 4pm: pimento cheese and bagel crisps and cucmbers  D (PM): grilled chicken 1.5 ounces with salad Snk (PM): chocolate protein shake   Fluid intake: 2 decaff coffee, 1 bottle of propel, 2 regular coffee: 50 ounces of coffee  Estimated total protein intake: 50g  Medications: See list Supplementation: opurity once a day and tums 3 times a day  Using straws: no Drinking while eating: no Having you been chewing well: yes Chewing/swallowing difficulties: once Changes in vision: no Changes to mood/headaches: no Hair loss/Changes to skin/Changes to nails: no Any difficulty focusing or concentrating: no Sweating: no Dizziness/Lightheaded: no Palpitations: no  Carbonated beverages: no N/V/D/C/GAS: some constipation  Abdominal Pain: no Dumping syndrome: no  Recent physical activity:  Walking 20-30 min, 6x/week   Progress Towards Goal(s):  In progress.   Nutritional Diagnosis:  Inadequate fluid intake As related to bariatric surgery post-op recommendations.  As evidenced by dietary recall of less than 64 oz of fluid daily. .    Intervention:  Nutrition education and counseling. Goals: -Eat 1 carbohydrate choice with each meal every day -Do not take your supplements before your labs Teaching Method Utilized:  Visual Auditory Hands on  Barriers to learning/adherence to lifestyle change: none  Demonstrated degree of understanding via:  Teach Back   Monitoring/Evaluation:  Dietary intake, exercise, and body weight.

## 2017-06-02 DIAGNOSIS — K219 Gastro-esophageal reflux disease without esophagitis: Secondary | ICD-10-CM | POA: Diagnosis not present

## 2017-06-02 DIAGNOSIS — Z13220 Encounter for screening for lipoid disorders: Secondary | ICD-10-CM | POA: Diagnosis not present

## 2017-06-02 DIAGNOSIS — F319 Bipolar disorder, unspecified: Secondary | ICD-10-CM | POA: Diagnosis not present

## 2017-06-02 DIAGNOSIS — G43909 Migraine, unspecified, not intractable, without status migrainosus: Secondary | ICD-10-CM | POA: Diagnosis not present

## 2017-06-02 DIAGNOSIS — R319 Hematuria, unspecified: Secondary | ICD-10-CM | POA: Diagnosis not present

## 2017-06-02 DIAGNOSIS — Z0000 Encounter for general adult medical examination without abnormal findings: Secondary | ICD-10-CM | POA: Diagnosis not present

## 2017-06-02 DIAGNOSIS — K911 Postgastric surgery syndromes: Secondary | ICD-10-CM | POA: Diagnosis not present

## 2017-06-02 DIAGNOSIS — Z9884 Bariatric surgery status: Secondary | ICD-10-CM | POA: Diagnosis not present

## 2017-06-02 DIAGNOSIS — R5383 Other fatigue: Secondary | ICD-10-CM | POA: Diagnosis not present

## 2017-06-07 DIAGNOSIS — J3081 Allergic rhinitis due to animal (cat) (dog) hair and dander: Secondary | ICD-10-CM | POA: Diagnosis not present

## 2017-06-07 DIAGNOSIS — J3089 Other allergic rhinitis: Secondary | ICD-10-CM | POA: Diagnosis not present

## 2017-06-07 DIAGNOSIS — J301 Allergic rhinitis due to pollen: Secondary | ICD-10-CM | POA: Diagnosis not present

## 2017-06-08 ENCOUNTER — Other Ambulatory Visit (HOSPITAL_COMMUNITY): Payer: Self-pay | Admitting: Psychiatry

## 2017-06-08 ENCOUNTER — Telehealth (HOSPITAL_COMMUNITY): Payer: Self-pay

## 2017-06-08 DIAGNOSIS — F319 Bipolar disorder, unspecified: Secondary | ICD-10-CM

## 2017-06-08 MED ORDER — DIVALPROEX SODIUM ER 500 MG PO TB24: 1000.0000 mg | ORAL_TABLET | Freq: Every day | ORAL | 1 refills | Status: DC

## 2017-06-08 NOTE — Telephone Encounter (Signed)
Patient is calling to report her Depakote level is 20, she had it done at PCP. She is worried that it is so low and she can not take a lot of pills due to bariatric surgery, she wants to know how she is going to get her level back up. Please review and advise, thank you

## 2017-06-08 NOTE — Telephone Encounter (Signed)
Spoke with pt who states she takes Depakote ER at bedtime. She is still angry/irritable. Even with recent increase in dose she has not noticed an improvement.   P: increase Depakote ER 1000mg  po qHS

## 2017-06-12 DIAGNOSIS — J3081 Allergic rhinitis due to animal (cat) (dog) hair and dander: Secondary | ICD-10-CM | POA: Diagnosis not present

## 2017-06-12 DIAGNOSIS — J301 Allergic rhinitis due to pollen: Secondary | ICD-10-CM | POA: Diagnosis not present

## 2017-06-12 DIAGNOSIS — J3089 Other allergic rhinitis: Secondary | ICD-10-CM | POA: Diagnosis not present

## 2017-06-14 DIAGNOSIS — J3081 Allergic rhinitis due to animal (cat) (dog) hair and dander: Secondary | ICD-10-CM | POA: Diagnosis not present

## 2017-06-14 DIAGNOSIS — J301 Allergic rhinitis due to pollen: Secondary | ICD-10-CM | POA: Diagnosis not present

## 2017-06-14 DIAGNOSIS — J3089 Other allergic rhinitis: Secondary | ICD-10-CM | POA: Diagnosis not present

## 2017-06-15 ENCOUNTER — Telehealth: Payer: Self-pay | Admitting: Neurology

## 2017-06-15 ENCOUNTER — Encounter: Payer: Self-pay | Admitting: Neurology

## 2017-06-15 ENCOUNTER — Ambulatory Visit (INDEPENDENT_AMBULATORY_CARE_PROVIDER_SITE_OTHER): Payer: BLUE CROSS/BLUE SHIELD | Admitting: Neurology

## 2017-06-15 VITALS — BP 103/68 | HR 72 | Ht 63.0 in | Wt 137.0 lb

## 2017-06-15 DIAGNOSIS — G43711 Chronic migraine without aura, intractable, with status migrainosus: Secondary | ICD-10-CM

## 2017-06-15 NOTE — Progress Notes (Signed)
[facility]    Provider:  Dr Jaynee Eagles Referring Provider: Lavone Orn, MD Primary Care Physician:  Lavone Orn, MD  CC:  migraines  HPI:  SAHIBA Lopez is a 47 y.o. female here as a referral from Dr. Laurann Montana for migraines. PMHx GAD, anxiety, Bipolar disorder, depression, insomnia, OSA, pneumonia, bipolar disorder, tobacco dependence, morbid obesity, sinusitis. Started at age 50-13. Strong FHx migraines. Patient [facility]. No aura. They are triggered by weather and sleeping changes, they have changed in the last few years. Her skin feels very sensitive. Migraines start on the right side, typically start in the back and spread to behind the eye and may spread to the whole head, pulsating, pounding, movement makes it worse, light sensitivity, nausea, sound senistivity and she is getting stabbing over the right.  She has 10 migraine days a month, 15 headache days a month,she has been getting nerve blocks by Dr. Tommi Rumps and not getting better. Out of the 10 migraines days, 6-7 severe affecting her life, putting her in bed. Laying down helps, a dark room helps.They can last 24 hours and even have lasted 5-7 days. She has had a hysterectomy. She has had bypass surgery. No medication overuse. Ongoing for over a year.  Tried: Zonisamide, Cataflam, nerve blocks.flexeril, depakote, zofran, gabapentin, tizanidine, scopolamine, Imitrex not effective, Maxalt chest pain, zoloft, paxil, blood pressure medications contraindicated due to her hypotension, reglan helps, Topamax.  Reviewed notes, labs and imaging from outside physicians, which showed:   Reviewed referring physician's notes.  She reports headache typical of her migraines for 1 week tried all her abortive medication without relief she is consulted with headache specialist who trialed an NSAID still with no relief she denies any recent trauma nausea emesis sensitivity to light.  The episode can last more  than 2 days and is constant and continuous.  No alcohol use.  Former smoker.  She reported nausea but no vomiting, reported neck stiffness and headaches.  Physical and neurologic exam were normal.  Looks like she was given a Toradol injection of Benadryl injection and a Reglan injection for acute management.  Also felt better after IV fluids and medications.  She has been in the emergency room for treatments.  cmp and cbc unremarkable 11/2016  Review of Systems: Patient complains of symptoms per HPI as well as the following symptoms: headache, fatigue, decreased energy. Pertinent negatives and positives per HPI. All others negative.   Social History   Socioeconomic History  . Marital status: Legally Separated    Spouse name: Not on file  . Number of children: 2  . Years of education: Not on file  . Highest education level: Some college, no degree  Occupational History  . Not on file  Social Needs  . Financial resource strain: Not on file  . Food insecurity:    Worry: Not on file    Inability: Not on file  . Transportation needs:    Medical: Not on file    Non-medical: Not on file  Tobacco Use  . Smoking status: Former Smoker    Packs/day: 0.50    Years: 4.00    Pack years: 2.00    Types: Cigarettes    Last attempt to quit: 11/12/2015    Years since quitting: 1.6  . Smokeless tobacco: Never Used  Substance and Sexual Activity  . Alcohol use: No    Alcohol/week: 0.0 oz    Comment: once a month or less  . Drug  use: No  . Sexual activity: Yes    Partners: Male    Birth control/protection: Surgical    Comment: Hysterectomy  Lifestyle  . Physical activity:    Days per week: 5 days    Minutes per session: 40 min  . Stress: Only a little  Relationships  . Social connections:    Talks on phone: Not on file    Gets together: Not on file    Attends religious service: Not on file    Active member of club or organization: Not on file    Attends meetings of clubs or  organizations: Not on file    Relationship status: Not on file  . Intimate partner violence:    Fear of current or ex partner: Not on file    Emotionally abused: Not on file    Physically abused: Not on file    Forced sexual activity: Not on file  Other Topics Concern  . Not on file  Social History Narrative   Lives at home with boyfriend   Right handed   Caffeine: 2 cups daily    Family History  Problem Relation Age of Onset  . ADD / ADHD Sister   . Hypertension Mother   . Sarcoidosis Mother   . Rheum arthritis Mother   . Hypertension Father   . Heart Problems Father   . Alcohol abuse Maternal Grandmother   . Bipolar disorder Maternal Grandmother   . Breast cancer Maternal Grandmother   . Breast cancer Maternal Aunt   . Heart Problems Other        dad's side of family  . Suicidality Neg Hx     Past Medical History:  Diagnosis Date  . Anxiety   . Bipolar disorder ([place])   . Depression   . Deviated nasal septum 02/2011  . Generalized anxiety disorder 04/16/2013  . GERD (gastroesophageal reflux disease)   . Headache(784.0)    migraines  . Insomnia   . Nasal turbinate hypertrophy 02/2011   bilat.  . Neuromuscular disorder [facility])    Dr. Jerilynn Mages. Domingo Cocking- does injections around nerve in her head for prevention of migraines - q 3 months   . OSA (obstructive sleep apnea)   . Pneumonia    hx of   . PONV (postoperative nausea and vomiting)   . Seasonal allergies   . Sleep apnea    uses BIPAP nightly; no longer using bipap, cleared from sleep apnea after bypass surgery    Past Surgical History:  Procedure Laterality Date  . ABDOMINAL HYSTERECTOMY    . CARPAL TUNNEL RELEASE Right   . DIAGNOSTIC LAPAROSCOPY    . FOOT SURGERY Right    bone removed from great toe   . GASTRIC ROUX-EN-Y N/A 05/16/2016   Procedure: LAPAROSCOPIC ROUX-EN-Y GASTRIC BYPASS WITH UPPER ENDOSCOPY;  Surgeon: Clovis Riley, MD;  Location: [facility];  Service: General;  Laterality: N/A;  . LAPAROSCOPIC  VAGINAL HYSTERECTOMY  03/14/2007  . LIPOMA EXCISION Left 08/26/2016   Procedure: EXCISION LIPOMA FROM LEFT UPPER POSTERIOR THIGH;  Surgeon: Clovis Riley, MD;  Location: [facility];  Service: General;  Laterality: Left;  . MYRINGOTOMY WITH TUBE PLACEMENT Bilateral 03/02/2015   Procedure: MYRINGOTOMY WITH T-TUBE PLACEMENT;  Surgeon: Leta Baptist, MD;  Location: [facility];  Service: ENT;  Laterality: Bilateral;  . NASAL SEPTOPLASTY W/ TURBINOPLASTY  02/22/2011   Procedure: NASAL SEPTOPLASTY WITH TURBINATE REDUCTION;  Surgeon: Ascencion Dike, MD;  Location: [facility];  Service: ENT;  Laterality: Bilateral;  . TUBAL LIGATION  12/16/2004    Current Outpatient Medications  Medication Sig Dispense Refill  . acetaminophen (TYLENOL) 500 MG tablet Take 1,000 mg by mouth every 6 (six) hours as needed (for pain/headaches).     [name] albuterol (PROVENTIL HFA;VENTOLIN HFA) 108 (90 Base) MCG/ACT inhaler Inhale 1-2 puffs into the lungs every 6 (six) hours as needed for wheezing or shortness of breath.    . ALPRAZolam (XANAX) 0.5 MG tablet Take 1 tablet (0.5 mg total) by mouth at bedtime as needed for anxiety. 30 tablet 1  . cyclobenzaprine (FLEXERIL) 10 MG tablet Take 5-10 mg by mouth at bedtime as needed. TMJ  2  . divalproex (DEPAKOTE ER) 500 MG 24 hr tablet Take 2 tablets (1,000 mg total) by mouth daily. 60 tablet 1  . hydrOXYzine (ATARAX/VISTARIL) 25 MG tablet Take 25 mg by mouth at bedtime as needed for itching.   3  . ipratropium (ATROVENT) 0.06 % nasal spray Place 2 sprays into both nostrils daily as needed. Allergies, runny nose.  4  . levocetirizine (XYZAL) 5 MG tablet Take 5 mg by mouth at bedtime.     [name] lithium 300 MG tablet Take 1 tablet (300 mg total) by mouth 2 (two) times daily. 60 tablet 1  . Multiple Vitamins-Minerals (OPURITY BYPASS OPTIMIZED) CHEW Chew 1 tablet by mouth daily.    . ondansetron (ZOFRAN-ODT) 4 MG disintegrating tablet Take 4 mg by mouth every 6 (six) hours as  needed for nausea or vomiting.   0  . valACYclovir (VALTREX) 500 MG tablet Take 500 mg by mouth See admin instructions. Takes 500mg  daily for 5 days when she has a flare up.  3  . zonisamide (ZONEGRAN) 25 MG capsule Take 50 mg by mouth at bedtime. Working up to 3 tablets at bedtime  2  . EPINEPHrine 0.3 mg/0.3 mL IJ SOAJ injection Inject 0.3 mg into the muscle daily as needed (for anaphylatic reactions.).   1   No current facility-administered medications for this visit.     Allergies as of 06/15/2017 - Review Complete 06/15/2017  Allergen Reaction Noted  . Doxazosin Nausea And Vomiting   . Morphine and related Itching 05/10/2016  . Phenergan [promethazine hcl]  05/10/2016  . Aloe Hives 02/18/2011  . Penicillins Rash 02/18/2011  . Sulfa antibiotics Rash 02/18/2011    Vitals: BP 103/68 (BP Location: Right Arm, Patient Position: Sitting)   Pulse 72   Ht 5\' 3"  (1.6 m)   Wt 137 lb (62.1 kg)   BMI 24.27 kg/m  Last Weight:  Wt Readings from Last 1 Encounters:  06/15/17 137 lb (62.1 kg)   Last Height:   Ht Readings from Last 1 Encounters:  06/15/17 5\' 3"  (1.6 m)   Physical exam: Exam: Gen: NAD, conversant, well nourised, well groomed                     CV: RRR, no MRG. No Carotid Bruits. No peripheral edema, warm, nontender Eyes: Conjunctivae clear without exudates or hemorrhage  Neuro: Detailed Neurologic Exam  Speech:    Speech is normal; fluent and spontaneous with normal comprehension.  Cognition:    The patient is oriented to person, place, and time;     recent and remote memory intact;     language fluent;     normal attention, concentration,     fund of knowledge Cranial Nerves:    The pupils are equal, round, and reactive to light. The fundi are  normal and spontaneous venous pulsations are present. Visual fields are full to finger confrontation. Extraocular movements are intact. Trigeminal sensation is intact and the muscles of mastication are normal. The face is  symmetric. The palate elevates in the midline. Hearing intact. Voice is normal. Shoulder shrug is normal. The tongue has normal motion without fasciculations.   Coordination:    Normal finger to nose and heel to shin. Normal rapid alternating movements.   Gait:    Heel-toe and tandem gait are normal.   Motor Observation:    No asymmetry, no atrophy, and no involuntary movements noted. Tone:    Normal muscle tone.    Posture:    Posture is normal. normal erect    Strength:    Strength is V/V in the upper and lower limbs.      Sensation: intact to LT     Reflex Exam:  DTR's:    Deep tendon reflexes in the upper and lower extremities are normal bilaterally.   Toes:    The toes are downgoing bilaterally.   Clonus:    Clonus is absent.       Assessment/Plan:  47 year old with chronic intractable migraines. Failed multiple medications, recommend botox for migraines  Tried: Zonisamide, Cataflam, nerve blocks.flexeril, depakote, zofran, gabapentin, tizanidine, scopolamine, Imitrex not effective, Maxalt chest pain, zoloft, paxil, blood pressure medications contraindicated due to her hypotension, reglan helps, Topamax.  Also discussed erenumab **  Discussed: To prevent or relieve headaches, try the following: Cool Compress. Lie down and place a cool compress on your head.  Avoid headache triggers. If certain foods or odors seem to have triggered your migraines in the past, avoid them. A headache diary might help you identify triggers.  Include physical activity in your daily routine. Try a daily walk or other moderate aerobic exercise.  Manage stress. Find healthy ways to cope with the stressors, such as delegating tasks on your to-do list.  Practice relaxation techniques. Try deep breathing, yoga, massage and visualization.  Eat regularly. Eating regularly scheduled meals and maintaining a healthy diet might help prevent headaches. Also, drink plenty of fluids.  Follow a regular  sleep schedule. Sleep deprivation might contribute to headaches Consider biofeedback. With this mind-body technique, you learn to control certain bodily functions - such as muscle tension, heart rate and blood pressure - to prevent headaches or reduce headache pain.    Proceed to emergency room if you experience new or worsening symptoms or symptoms do not resolve, if you have new neurologic symptoms or if headache is severe, or for any concerning symptom.   Provided education and documentation from American headache Society toolbox including articles on: chronic migraine medication overuse headache, chronic migraines, prevention of migraines, behavioral and other nonpharmacologic treatments for headache.   Sarina Ill, MD  [facility] [address] [place], [place] [address]  Phone [phone] Fax [phone]

## 2017-06-15 NOTE — Telephone Encounter (Signed)
Pt scheduled for July 19, 2017 @ 4:30 pm.

## 2017-06-15 NOTE — Telephone Encounter (Signed)
Paperwork submitted to Dr. Jaynee Eagles for signature. [employer] and [employer].

## 2017-06-15 NOTE — Telephone Encounter (Signed)
Per Dr. Jaynee Eagles, ok to schedule at 4:30. Patient would like July 10th at 4:30. Please schedule. Thanks [place]!

## 2017-06-15 NOTE — Patient Instructions (Signed)
Erenumab 140mg  monthly  Erenumab: Patient drug information Copyright [phone] [employer] All rights reserved. (For additional information see "Erenumab: Drug information") Brand Names: [name]  Aimovig  What is this drug used for?   It is used to prevent migraine headaches.  What do I need to tell my doctor BEFORE I take this drug?   If you have an allergy to this drug or any part of this drug.   If you are allergic to any drugs like this one, any other drugs, foods, or other substances. Tell your doctor about the allergy and what signs you had, like rash; hives; itching; shortness of breath; wheezing; cough; swelling of face, lips, tongue, or throat; or any other signs.   This drug may interact with other drugs or health problems.   Tell your doctor and pharmacist about all of your drugs (prescription or OTC, natural products, vitamins) and health problems. You must check to make sure that it is safe for you to take this drug with all of your drugs and health problems. Do not start, stop, or change the dose of any drug without checking with your doctor.  What are some things I need to know or do while I take this drug?   Tell all of your health care providers that you take this drug. This includes your doctors, nurses, pharmacists, and dentists.   If you have a latex allergy, talk with your doctor.   Tell your doctor if you are pregnant or plan on getting pregnant. You will need to talk about the benefits and risks of using this drug while you are pregnant.   Tell your doctor if you are breast-feeding. You will need to talk about any risks to your baby.  What are some side effects that I need to call my doctor about right away?   WARNING/CAUTION: Even though it may be rare, some people may have very bad and sometimes deadly side effects when taking a drug. Tell your doctor or get medical help right away if you have any of the following signs or symptoms that may be related to a very bad  side effect:   Signs of an allergic reaction, like rash; hives; itching; red, swollen, blistered, or peeling skin with or without fever; wheezing; tightness in the chest or throat; trouble breathing, swallowing, or talking; unusual hoarseness; or swelling of the mouth, face, lips, tongue, or throat.  What are some other side effects of this drug?   All drugs may cause side effects. However, many people have no side effects or only have minor side effects. Call your doctor or get medical help if any of these side effects or any other side effects bother you or do not go away:   Redness or swelling where the shot is given.   Pain where the shot was given.   Constipation.   These are not all of the side effects that may occur. If you have questions about side effects, call your doctor. Call your doctor for medical advice about side effects.   You may report side effects to your national health agency.  How is this drug best taken?   Use this drug as ordered by your doctor. Read all information given to you. Follow all instructions closely.   It is given as a shot into the fatty part of the skin on the top of the thigh, belly area, or upper arm.   If you will be giving yourself the shot, your doctor or  nurse will teach you how to give the shot.   Follow how to use as you have been told by the doctor or read the package insert.   If stored in a refrigerator, let this drug come to room temperature before using it. Leave it at room temperature for at least 30 minutes. Do not heat this drug.   Protect from heat and sunlight.   Do not shake.   Do not give into skin that is irritated, bruised, red, infected, or scarred.   Do not use if the solution is cloudy, leaking, or has particles.   Do not use if solution changes color.   Throw away after using. Do not use the device more than 1 time.   Throw away needles in a needle/sharp disposal box. Do not reuse needles or other items. When the box is full,  follow all local rules for getting rid of it. Talk with a doctor or pharmacist if you have any questions.  What do I do if I miss a dose?   Take a missed dose as soon as you think about it.   After taking a missed dose, start a new schedule based on when the dose is taken.  How do I store and/or throw out this drug?   Store in a refrigerator. Do not freeze.   Store in the carton to protect from light.   Do not use if it has been frozen.   If you drop this drug on a hard surface, do not use it.   If needed, you may store at room temperature for up to 7 days. Write down the date you take this drug out of the refrigerator. If stored at room temperature and not used within 7 days, throw this drug away.   Do not put this drug back in the refrigerator after it has been stored at room temperature.   Keep all drugs in a safe place. Keep all drugs out of the reach of children and pets.   Throw away unused or expired drugs. Do not flush down a toilet or pour down a drain unless you are told to do so. Check with your pharmacist if you have questions about the best way to throw out drugs. There may be drug take-back programs in your area.  General drug facts   If your symptoms or health problems do not get better or if they become worse, call your doctor.   Do not share your drugs with others and do not take anyone else's drugs.   Keep a list of all your drugs (prescription, natural products, vitamins, OTC) with you. Give this list to your doctor.   Talk with the doctor before starting any new drug, including prescription or OTC, natural products, or vitamins.   Some drugs may have another patient information leaflet. If you have any questions about this drug, please talk with your doctor, nurse, pharmacist, or other health care provider.   If you think there has been an overdose, call your poison control center or get medical care right away. Be ready to tell or show what was taken, how much, and when it  happened.   

## 2017-06-16 NOTE — Telephone Encounter (Signed)
Faxed paperwork to [employer].

## 2017-06-18 ENCOUNTER — Encounter: Payer: Self-pay | Admitting: Neurology

## 2017-06-18 DIAGNOSIS — G43711 Chronic migraine without aura, intractable, with status migrainosus: Secondary | ICD-10-CM | POA: Insufficient documentation

## 2017-06-19 DIAGNOSIS — J3081 Allergic rhinitis due to animal (cat) (dog) hair and dander: Secondary | ICD-10-CM | POA: Diagnosis not present

## 2017-06-19 DIAGNOSIS — J3089 Other allergic rhinitis: Secondary | ICD-10-CM | POA: Diagnosis not present

## 2017-06-19 DIAGNOSIS — J301 Allergic rhinitis due to pollen: Secondary | ICD-10-CM | POA: Diagnosis not present

## 2017-06-19 NOTE — Telephone Encounter (Signed)
BCBS Auth:  [id number] (exp. 06/16/17 to 12/13/17)  [id number] (exp. 06/16/17 to 12/13/17)

## 2017-06-21 DIAGNOSIS — J3081 Allergic rhinitis due to animal (cat) (dog) hair and dander: Secondary | ICD-10-CM | POA: Diagnosis not present

## 2017-06-21 DIAGNOSIS — J301 Allergic rhinitis due to pollen: Secondary | ICD-10-CM | POA: Diagnosis not present

## 2017-06-21 DIAGNOSIS — J3089 Other allergic rhinitis: Secondary | ICD-10-CM | POA: Diagnosis not present

## 2017-06-22 NOTE — Telephone Encounter (Signed)
I called [employer] to check the status on the Botox. I spoke to [place] and she informed me it is still in insurance verification review.

## 2017-06-26 DIAGNOSIS — J3089 Other allergic rhinitis: Secondary | ICD-10-CM | POA: Diagnosis not present

## 2017-06-26 DIAGNOSIS — J301 Allergic rhinitis due to pollen: Secondary | ICD-10-CM | POA: Diagnosis not present

## 2017-06-26 DIAGNOSIS — J3081 Allergic rhinitis due to animal (cat) (dog) hair and dander: Secondary | ICD-10-CM | POA: Diagnosis not present

## 2017-06-28 DIAGNOSIS — J3081 Allergic rhinitis due to animal (cat) (dog) hair and dander: Secondary | ICD-10-CM | POA: Diagnosis not present

## 2017-06-28 DIAGNOSIS — J301 Allergic rhinitis due to pollen: Secondary | ICD-10-CM | POA: Diagnosis not present

## 2017-06-28 DIAGNOSIS — J3089 Other allergic rhinitis: Secondary | ICD-10-CM | POA: Diagnosis not present

## 2017-06-28 NOTE — Telephone Encounter (Signed)
I called to check status of the patients Botox medication. I spoke with Lorna Few at [facility] [phone]. It is still pending insurance verification. They are marking it a stat request.

## 2017-06-30 DIAGNOSIS — J301 Allergic rhinitis due to pollen: Secondary | ICD-10-CM | POA: Diagnosis not present

## 2017-06-30 DIAGNOSIS — R05 Cough: Secondary | ICD-10-CM | POA: Diagnosis not present

## 2017-06-30 DIAGNOSIS — J209 Acute bronchitis, unspecified: Secondary | ICD-10-CM | POA: Diagnosis not present

## 2017-06-30 DIAGNOSIS — J019 Acute sinusitis, unspecified: Secondary | ICD-10-CM | POA: Diagnosis not present

## 2017-07-03 NOTE — Telephone Encounter (Signed)
I called to check status of the patients medication, it is still pending insurance verification.

## 2017-07-04 NOTE — Telephone Encounter (Signed)
I called [employer] and spoke with Anderson Malta, she stated that the Botox was still pending insurance verification. She transferred me to the insurance department.   The representative that pushed it to a supervisor who stated it would be taken care of.

## 2017-07-05 NOTE — Telephone Encounter (Signed)
Ronalee Belts with [facility] requesting a call at [phone] to schedule delivery for pts Botox

## 2017-07-05 NOTE — Telephone Encounter (Signed)
I called and spoke with the patient regarding her medication. I gave her the phone number to [facility] [phone].

## 2017-07-05 NOTE — Telephone Encounter (Signed)
I called and spoke with Margaret Lopez who stated it was awaiting patient consent. DW

## 2017-07-06 NOTE — Telephone Encounter (Signed)
I called and scheduled delivery for 07/12/17. DW

## 2017-07-11 DIAGNOSIS — H6122 Impacted cerumen, left ear: Secondary | ICD-10-CM | POA: Diagnosis not present

## 2017-07-11 DIAGNOSIS — H9011 Conductive hearing loss, unilateral, right ear, with unrestricted hearing on the contralateral side: Secondary | ICD-10-CM | POA: Diagnosis not present

## 2017-07-11 DIAGNOSIS — H6981 Other specified disorders of Eustachian tube, right ear: Secondary | ICD-10-CM | POA: Diagnosis not present

## 2017-07-11 DIAGNOSIS — B369 Superficial mycosis, unspecified: Secondary | ICD-10-CM | POA: Diagnosis not present

## 2017-07-12 DIAGNOSIS — J3089 Other allergic rhinitis: Secondary | ICD-10-CM | POA: Diagnosis not present

## 2017-07-12 DIAGNOSIS — J301 Allergic rhinitis due to pollen: Secondary | ICD-10-CM | POA: Diagnosis not present

## 2017-07-12 DIAGNOSIS — J3081 Allergic rhinitis due to animal (cat) (dog) hair and dander: Secondary | ICD-10-CM | POA: Diagnosis not present

## 2017-07-14 ENCOUNTER — Telehealth (HOSPITAL_COMMUNITY): Payer: Self-pay

## 2017-07-14 NOTE — Telephone Encounter (Signed)
Patient called requesting to increase her Depakote ER to three  tabs per day. Next appointment is scheduled for 08-03-17. Please advise

## 2017-07-19 ENCOUNTER — Telehealth: Payer: Self-pay | Admitting: Neurology

## 2017-07-19 ENCOUNTER — Ambulatory Visit (INDEPENDENT_AMBULATORY_CARE_PROVIDER_SITE_OTHER): Payer: BLUE CROSS/BLUE SHIELD | Admitting: Neurology

## 2017-07-19 ENCOUNTER — Telehealth (HOSPITAL_COMMUNITY): Payer: Self-pay

## 2017-07-19 VITALS — BP 102/65 | HR 60 | Wt 135.0 lb

## 2017-07-19 DIAGNOSIS — G43711 Chronic migraine without aura, intractable, with status migrainosus: Secondary | ICD-10-CM

## 2017-07-19 DIAGNOSIS — M7918 Myalgia, other site: Secondary | ICD-10-CM

## 2017-07-19 MED ORDER — KETOROLAC TROMETHAMINE 60 MG/2ML IM SOLN
60.0000 mg | Freq: Once | INTRAMUSCULAR | Status: AC
  Administered 2017-07-19: 60 mg via INTRAMUSCULAR

## 2017-07-19 MED ORDER — DIHYDROERGOTAMINE MESYLATE 4 MG/ML NA SOLN: NASAL | 12 refills | Status: DC

## 2017-07-19 NOTE — Telephone Encounter (Signed)
Please call pt to schedule next BOTOX appt per Dr. Jaynee Eagles. Thank you.

## 2017-07-19 NOTE — Telephone Encounter (Signed)
Patient is calling to see if she can take an extra xanax when she flies later this month. Patient is claustrophobic and has never flown before, please review and advise, thank you

## 2017-07-19 NOTE — Progress Notes (Signed)
Toradol 60 mg IM injection administered in RUOQ of buttock. Pt tolerated well. See MAR. Bandaid applied.  Botox- 100 units x 2 vials Lot: [id number] Expiration: 01/2020 NDC: 6659-9357-01  Bacteriostatic 0.9% Sodium Chloride- 14mL total Lot: [id number] Expiration: 05/11/2018 NDC: 3009-2330-07  Dx: M22.633 S/P

## 2017-07-19 NOTE — Progress Notes (Signed)
Consent Form Botulism Toxin Injection For Chronic Migraine  First botox injections. Will also send to PT for dry needling.  Orders Placed This Encounter  Procedures  . Ambulatory referral to Physical Therapy   Reviewed orally with patient, additionally signature is on file:  Botulism toxin has been approved by the Federal drug administration for treatment of chronic migraine. Botulism toxin does not cure chronic migraine and it may not be effective in some patients.  The administration of botulism toxin is accomplished by injecting a small amount of toxin into the muscles of the neck and head. Dosage must be titrated for each individual. Any benefits resulting from botulism toxin tend to wear off after 3 months with a repeat injection required if benefit is to be maintained. Injections are usually done every 3-4 months with maximum effect peak achieved by about 2 or 3 weeks. Botulism toxin is expensive and you should be sure of what costs you will incur resulting from the injection.  The side effects of botulism toxin use for chronic migraine may include:   -Transient, and usually mild, facial weakness with facial injections  -Transient, and usually mild, head or neck weakness with head/neck injections  -Reduction or loss of forehead facial animation due to forehead muscle weakness  -Eyelid drooping  -Dry eye  -Pain at the site of injection or bruising at the site of injection  -Double vision  -Potential unknown long term risks  Contraindications: You should not have Botox if you are pregnant, nursing, allergic to albumin, have an infection, skin condition, or muscle weakness at the site of the injection, or have myasthenia gravis, Lambert-Eaton syndrome, or ALS.  It is also possible that as with any injection, there may be an allergic reaction or no effect from the medication. Reduced effectiveness after repeated injections is sometimes seen and rarely infection at the injection site  may occur. All care will be taken to prevent these side effects. If therapy is given over a long time, atrophy and wasting in the muscle injected may occur. Occasionally the patient's become refractory to treatment because they develop antibodies to the toxin. In this event, therapy needs to be modified.  I have read the above information and consent to the administration of botulism toxin.    BOTOX PROCEDURE NOTE FOR MIGRAINE HEADACHE    Contraindications and precautions discussed with patient(above). Aseptic procedure was observed and patient tolerated procedure. Procedure performed by Dr. Georgia Dom  The condition has existed for more than 6 months, and pt does not have a diagnosis of ALS, Myasthenia Gravis or Lambert-Eaton Syndrome.  Risks and benefits of injections discussed and pt agrees to proceed with the procedure.  Written consent obtained  These injections are medically necessary. Pt  receives good benefits from these injections. These injections do not cause sedations or hallucinations which the oral therapies may cause.  Indication/Diagnosis: chronic migraine BOTOX(J0585) injection was performed according to protocol by [employer]. 200 units of BOTOX was dissolved into 4 cc NS.   NDC: [address]-03   Description of procedure:  The patient was placed in a sitting position. The standard protocol was used for Botox as follows, with 5 units of Botox injected at each site:   -Procerus muscle, midline injection  -Corrugator muscle, bilateral injection  -Frontalis muscle, bilateral injection, with 2 sites each side, medial injection was performed in the upper one third of the frontalis muscle, in the region vertical from the medial inferior edge of the superior orbital rim. The lateral  injection was again in the upper one third of the forehead vertically above the lateral limbus of the cornea, 1.5 cm lateral to the medial injection site.  -Temporalis muscle injection, 4 sites,  bilaterally. The first injection was 3 cm above the tragus of the ear, second injection site was 1.5 cm to 3 cm up from the first injection site in line with the tragus of the ear. The third injection site was 1.5-3 cm forward between the first 2 injection sites. The fourth injection site was 1.5 cm posterior to the second injection site.  -Occipitalis muscle injection, 3 sites, bilaterally. The first injection was done one half way between the occipital protuberance and the tip of the mastoid process behind the ear. The second injection site was done lateral and superior to the first, 1 fingerbreadth from the first injection. The third injection site was 1 fingerbreadth superiorly and medially from the first injection site.  -Cervical paraspinal muscle injection, 2 sites, bilateral knee first injection site was 1 cm from the midline of the cervical spine, 3 cm inferior to the lower border of the occipital protuberance. The second injection site was 1.5 cm superiorly and laterally to the first injection site.  -Trapezius muscle injection was performed at 3 sites, bilaterally. The first injection site was in the upper trapezius muscle halfway between the inflection point of the neck, and the acromion. The second injection site was one half way between the acromion and the first injection site. The third injection was done between the first injection site and the inflection point of the neck.   Will return for repeat injection in 3 months.   A 200 unit sof Botox was used, 155 units were injected, the rest of the Botox was wasted. The patient tolerated the procedure well, there were no complications of the above procedure.

## 2017-07-20 NOTE — Telephone Encounter (Signed)
We will discuss this at her scheduled appt

## 2017-07-20 NOTE — Telephone Encounter (Signed)
Yes that is fine

## 2017-07-21 DIAGNOSIS — H6983 Other specified disorders of Eustachian tube, bilateral: Secondary | ICD-10-CM | POA: Diagnosis not present

## 2017-07-21 DIAGNOSIS — H6523 Chronic serous otitis media, bilateral: Secondary | ICD-10-CM | POA: Diagnosis not present

## 2017-07-21 HISTORY — PX: TYMPANOSTOMY TUBE PLACEMENT: SHX32

## 2017-07-21 NOTE — Telephone Encounter (Signed)
Called patient and let her know it was okay

## 2017-07-24 NOTE — Telephone Encounter (Signed)
Thank you Shirley.

## 2017-07-24 NOTE — Telephone Encounter (Signed)
Scheduled patient with Dr. Jaynee Eagles for Botox on 10-25-17 @ 3:30pm check in at 3pm.

## 2017-07-24 NOTE — Telephone Encounter (Signed)
I called to schedule the patient but she did not answer so I left a VM asking her to call me back.  

## 2017-08-03 ENCOUNTER — Encounter (HOSPITAL_COMMUNITY): Payer: Self-pay | Admitting: Psychiatry

## 2017-08-03 ENCOUNTER — Ambulatory Visit (INDEPENDENT_AMBULATORY_CARE_PROVIDER_SITE_OTHER): Payer: BLUE CROSS/BLUE SHIELD | Admitting: Psychiatry

## 2017-08-03 DIAGNOSIS — F319 Bipolar disorder, unspecified: Secondary | ICD-10-CM | POA: Diagnosis not present

## 2017-08-03 DIAGNOSIS — Z79899 Other long term (current) drug therapy: Secondary | ICD-10-CM | POA: Diagnosis not present

## 2017-08-03 DIAGNOSIS — F411 Generalized anxiety disorder: Secondary | ICD-10-CM | POA: Diagnosis not present

## 2017-08-03 MED ORDER — LITHIUM CARBONATE ER 300 MG PO TBCR: 600.0000 mg | EXTENDED_RELEASE_TABLET | Freq: Every evening | ORAL | 2 refills | Status: DC

## 2017-08-03 MED ORDER — DIVALPROEX SODIUM ER 500 MG PO TB24: 1500.0000 mg | ORAL_TABLET | Freq: Every day | ORAL | 2 refills | Status: DC

## 2017-08-03 NOTE — Progress Notes (Signed)
[facility] MD/PA/NP OP Progress Note  08/03/2017 10:03 AM ONITA PFLUGER  MRN:  [id number]  Chief Complaint:  Chief Complaint    Follow-up     HPI: "Something has to be done.  I cannot continue like this.  It is affecting my relationships."    Patient states that nothing has really changed since the last visit.  She is sleeping okay but is chronically tired.  She feels as though the depression is always in the background and she is trying to keep it from getting worse.  She is now started to have random crying spells.  She continues to force herself to go out and be active but overall she has low motivation.  She does enjoy herself when she goes out.  Raylea denies SI/HI.  She denies manic and hypomanic-like symptoms.  She reports that she continues to have random periods of anxiety.  It causes irritability.  She has racing thoughts that seem out of proportion to the situation.  Despite this she rarely takes Xanax and states that she still has a large amount left over.  Patient is only been taking lithium at bedtime as she states the morning dose causes her to feel overly fatigued.  She is taking her Depakote at bedtime as well.  She is able to tolerate taking these medications at bedtime.  At this point in time she would like to increase the dose and thinks that she may be able to take part of the dose at dinnertime and then the rest at bedtime.   Visit Diagnosis:    ICD-10-CM   1. Bipolar 1 disorder, depressed (HCC) F31.9 lithium carbonate (LITHOBID) 300 MG CR tablet    divalproex (DEPAKOTE ER) 500 MG 24 hr tablet      Past Psychiatric History:  Anxiety:Yes Bipolar Disorder:Yes Depression:Yes Mania:Yes Psychosis:No Schizophrenia:No Personality Disorder:No Hospitalization for psychiatric illness:No History of Electroconvulsive Shock Therapy:No Prior Suicide Attempts:No    Past Medical History:  Past Medical History:  Diagnosis Date  . Anxiety   . Bipolar disorder ([place])   .  Depression   . Deviated nasal septum 02/2011  . Generalized anxiety disorder 04/16/2013  . GERD (gastroesophageal reflux disease)   . Headache(784.0)    migraines  . Insomnia   . Nasal turbinate hypertrophy 02/2011   bilat.  . Neuromuscular disorder [facility])    Dr. Jerilynn Mages. Domingo Cocking- does injections around nerve in her head for prevention of migraines - q 3 months   . OSA (obstructive sleep apnea)   . Pneumonia    hx of   . PONV (postoperative nausea and vomiting)   . Seasonal allergies   . Sleep apnea    uses BIPAP nightly; no longer using bipap, cleared from sleep apnea after bypass surgery    Past Surgical History:  Procedure Laterality Date  . ABDOMINAL HYSTERECTOMY    . CARPAL TUNNEL RELEASE Right   . DIAGNOSTIC LAPAROSCOPY    . FOOT SURGERY Right    bone removed from great toe   . GASTRIC ROUX-EN-Y N/A 05/16/2016   Procedure: LAPAROSCOPIC ROUX-EN-Y GASTRIC BYPASS WITH UPPER ENDOSCOPY;  Surgeon: Clovis Riley, MD;  Location: [facility];  Service: General;  Laterality: N/A;  . LAPAROSCOPIC VAGINAL HYSTERECTOMY  03/14/2007  . LIPOMA EXCISION Left 08/26/2016   Procedure: EXCISION LIPOMA FROM LEFT UPPER POSTERIOR THIGH;  Surgeon: Clovis Riley, MD;  Location: [place];  Service: General;  Laterality: Left;  . MYRINGOTOMY WITH TUBE PLACEMENT Bilateral 03/02/2015   Procedure: MYRINGOTOMY WITH  T-TUBE PLACEMENT;  Surgeon: Leta Baptist, MD;  Location: [place];  Service: ENT;  Laterality: Bilateral;  . NASAL SEPTOPLASTY W/ TURBINOPLASTY  02/22/2011   Procedure: NASAL SEPTOPLASTY WITH TURBINATE REDUCTION;  Surgeon: Ascencion Dike, MD;  Location: [place];  Service: ENT;  Laterality: Bilateral;  . TUBAL LIGATION  12/16/2004  . TYMPANOSTOMY TUBE PLACEMENT Bilateral 07/21/2017    Family Psychiatric History:  Family History  Problem Relation Age of Onset  . ADD / ADHD Sister   . Hypertension Mother   . Sarcoidosis Mother   . Rheum arthritis Mother   . Hypertension Father   .  Heart Problems Father   . Alcohol abuse Maternal Grandmother   . Bipolar disorder Maternal Grandmother   . Breast cancer Maternal Grandmother   . Breast cancer Maternal Aunt   . Heart Problems Other        dad's side of family  . Suicidality Neg Hx     Social History:  Social History   Socioeconomic History  . Marital status: Legally Separated    Spouse name: Not on file  . Number of children: 2  . Years of education: Not on file  . Highest education level: Some college, no degree  Occupational History  . Not on file  Social Needs  . Financial resource strain: Not on file  . Food insecurity:    Worry: Not on file    Inability: Not on file  . Transportation needs:    Medical: Not on file    Non-medical: Not on file  Tobacco Use  . Smoking status: Former Smoker    Packs/day: 0.50    Years: 4.00    Pack years: 2.00    Types: Cigarettes    Last attempt to quit: 11/12/2015    Years since quitting: 1.7  . Smokeless tobacco: Never Used  Substance and Sexual Activity  . Alcohol use: No    Alcohol/week: 0.0 oz    Comment: once a month or less  . Drug use: No  . Sexual activity: Yes    Partners: Male    Birth control/protection: Surgical    Comment: Hysterectomy  Lifestyle  . Physical activity:    Days per week: 5 days    Minutes per session: 40 min  . Stress: Only a little  Relationships  . Social connections:    Talks on phone: Not on file    Gets together: Not on file    Attends religious service: Not on file    Active member of club or organization: Not on file    Attends meetings of clubs or organizations: Not on file    Relationship status: Not on file  Other Topics Concern  . Not on file  Social History Narrative   Lives at home with boyfriend   Right handed   Caffeine: 2 cups daily    Allergies:  Allergies  Allergen Reactions  . Doxazosin Nausea And Vomiting  . Morphine And Related Itching  . Phenergan [Promethazine Hcl]     " knocks out for  several hours"  . Aloe Hives  . Penicillins Rash    Has patient had a PCN reaction causing immediate rash, facial/tongue/throat swelling, SOB or lightheadedness with hypotension: No Has patient had a PCN reaction causing severe rash involving mucus membranes or skin necrosis: No Has patient had a PCN reaction that required hospitalization No Has patient had a PCN reaction occurring within the last 10 years: No If all  of the above answers are "NO", then may proceed with Cephalosporin use.   . Sulfa Antibiotics Rash    Metabolic Disorder Labs: No results found for: HGBA1C, MPG No results found for: PROLACTIN No results found for: CHOL, TRIG, HDL, CHOLHDL, VLDL, LDLCALC No results found for: TSH  Therapeutic Level Labs: No results found for: LITHIUM No results found for: VALPROATE No components found for:  CBMZ  Current Medications: Current Outpatient Medications  Medication Sig Dispense Refill  . acetaminophen (TYLENOL) 500 MG tablet Take 1,000 mg by mouth every 6 (six) hours as needed (for pain/headaches).     [name] albuterol (PROVENTIL HFA;VENTOLIN HFA) 108 (90 Base) MCG/ACT inhaler Inhale 1-2 puffs into the lungs every 6 (six) hours as needed for wheezing or shortness of breath.    . ALPRAZolam (XANAX) 0.5 MG tablet Take 1 tablet (0.5 mg total) by mouth at bedtime as needed for anxiety. 30 tablet 1  . cyclobenzaprine (FLEXERIL) 10 MG tablet Take 5-10 mg by mouth at bedtime as needed. TMJ  2  . dihydroergotamine (MIGRANAL) 4 MG/ML nasal spray 1 spray into each nostril. Don't tilt your head back, sniff through your nose, or blow your nose. May repeat in 15 minutes. Max 4 sprays/day. 8 mL 12  . divalproex (DEPAKOTE ER) 500 MG 24 hr tablet Take 3 tablets (1,500 mg total) by mouth daily. 90 tablet 2  . EPINEPHrine 0.3 mg/0.3 mL IJ SOAJ injection Inject 0.3 mg into the muscle daily as needed (for anaphylatic reactions.).   1  . hydrOXYzine (ATARAX/VISTARIL) 25 MG tablet Take 25 mg by mouth at  bedtime as needed for itching.   3  . ipratropium (ATROVENT) 0.06 % nasal spray Place 2 sprays into both nostrils daily as needed. Allergies, runny nose.  4  . levocetirizine (XYZAL) 5 MG tablet Take 5 mg by mouth at bedtime.     . Multiple Vitamins-Minerals (OPURITY BYPASS OPTIMIZED) CHEW Chew 1 tablet by mouth daily.    . ondansetron (ZOFRAN-ODT) 4 MG disintegrating tablet Take 4 mg by mouth every 6 (six) hours as needed for nausea or vomiting.   0  . valACYclovir (VALTREX) 500 MG tablet Take 500 mg by mouth See admin instructions. Takes 500mg  daily for 5 days when she has a flare up.  3  . zonisamide (ZONEGRAN) 25 MG capsule Take 50 mg by mouth at bedtime. Working up to 3 tablets at bedtime  2  . lithium carbonate (LITHOBID) 300 MG CR tablet Take 2 tablets (600 mg total) by mouth every evening. 60 tablet 2   No current facility-administered medications for this visit.      Musculoskeletal: Strength & Muscle Tone: within normal limits Gait & Station: normal Patient leans: N/A  Psychiatric Specialty Exam: Review of Systems  Gastrointestinal: Positive for abdominal pain. Negative for nausea and vomiting.  Psychiatric/Behavioral: Positive for depression. Negative for hallucinations. The patient does not have insomnia.     Blood pressure 126/74, pulse 88, height 5\' 3"  (1.6 m), weight 137 lb (62.1 kg).Body mass index is 24.27 kg/m.  General Appearance: Fairly Groomed  Eye Contact:  Good  Speech:  Clear and Coherent and Normal Rate  Volume:  Normal  Mood:  Anxious and Depressed  Affect:  Congruent  Thought Process:  Goal Directed and Descriptions of Associations: Intact  Orientation:  Full (Time, Place, and Person)  Thought Content: Logical   Suicidal Thoughts:  No  Homicidal Thoughts:  No  Memory:  Immediate;   Good Recent;  Good Remote;   Good  Judgement:  Fair  Insight:  Good  Psychomotor Activity:  Normal  Concentration:  Concentration: Good and Attention Span: Good   Recall:  Good  Fund of Knowledge: Good  Language: Good  Akathisia:  No  Handed:  Right  AIMS (if indicated): not done  Assets:  Communication Skills Desire for Improvement Financial Resources/Insurance Housing Intimacy Social Support Talents/Skills Transportation Vocational/Educational  ADL's:  Intact  Cognition: WNL  Sleep:  Good   Screenings: PHQ2-9     N[facility] Nutrition [facility] 05/09/2016 [facility] Nutrition from 02/05/2016 in [facility]  PHQ-2 Total Score  0  0  0      I reviewed the information below on 08/03/2017 and agree except where noted/changed Assessment and Plan: Bipolar I disorder; GAD    Medication management with supportive therapy. Risks and benefits, side effects and alternative treatment options discussed with patient. Pt was given an opportunity to ask questions about medication, illness, and treatment. All current psychiatric medications have been reviewed and discussed with the patient and adjusted as clinically appropriate. The patient has been provided an accurate and updated list of the medications being now prescribed. Patient expressed understanding of how their medications were to be used.  Pt verbalized understanding and verbal consent obtained for treatment.  The risk of un-intended pregnancy is low based on the fact that pt reports she had a hysterecomy. Pt is aware that these meds carry a teratogenic risk. Pt will discuss plan of action if she does or plans to become pregnant in the future.  Status of current problems: ongoing  Meds: change to Lithium ER 600mg  po qPM for Bipolar disorder Xanax 0.5mg  po qD prn anxiety  increase Depakote ER 1500mg  po qD for  Bipolar disorder- at bedtime    Labs: reviewed labs done 06/06/2017 Lithium 0.3 and Depakote level 21- results will be scanned into her chart  Therapy: brief  supportive therapy provided. Discussed psychosocial stressors in detail.     Consultations: none  Pt denies SI and is at an acute low risk for suicide. Patient told to call clinic if any problems occur. Patient advised to go to ER if they should develop SI/HI, side effects, or if symptoms worsen. Has crisis numbers to call if needed. Pt verbalized understanding.  F/up in 2 months or sooner if needed  The duration of this appointment visit was 36minutes of face-to-face time with the patient. Greater than 50% of this time was spent in counseling, explanation of diagnosis, planning of further management, and coordination of care   Charlcie Cradle, MD 08/03/2017, 10:03 AM

## 2017-08-07 ENCOUNTER — Encounter: Payer: Self-pay | Admitting: Physical Therapy

## 2017-08-07 ENCOUNTER — Ambulatory Visit: Payer: BLUE CROSS/BLUE SHIELD | Attending: Neurology | Admitting: Physical Therapy

## 2017-08-07 DIAGNOSIS — J301 Allergic rhinitis due to pollen: Secondary | ICD-10-CM | POA: Diagnosis not present

## 2017-08-07 DIAGNOSIS — M542 Cervicalgia: Secondary | ICD-10-CM | POA: Diagnosis not present

## 2017-08-07 DIAGNOSIS — G44221 Chronic tension-type headache, intractable: Secondary | ICD-10-CM | POA: Insufficient documentation

## 2017-08-07 DIAGNOSIS — J3089 Other allergic rhinitis: Secondary | ICD-10-CM | POA: Diagnosis not present

## 2017-08-07 DIAGNOSIS — J3081 Allergic rhinitis due to animal (cat) (dog) hair and dander: Secondary | ICD-10-CM | POA: Diagnosis not present

## 2017-08-07 NOTE — Therapy (Signed)
[place], [place], [address] Phone: [phone]   Fax:  [phone]  Physical Therapy Evaluation  Patient Details  Name: Margaret Lopez MRN: [id number] Date of Birth: 24-Jan-1970 Referring Provider: Jaynee Eagles   Encounter Date: 08/07/2017  PT End of Session - 08/07/17 1735    Visit Number  1    Date for PT Re-Evaluation  10/08/17    PT Start Time  3893    PT Stop Time  1750    PT Time Calculation (min)  60 min    Activity Tolerance  Patient tolerated treatment well    Behavior During Therapy  [facility] for tasks assessed/performed       Past Medical History:  Diagnosis Date  . Anxiety   . Bipolar disorder ([place])   . Depression   . Deviated nasal septum 02/2011  . Generalized anxiety disorder 04/16/2013  . GERD (gastroesophageal reflux disease)   . Headache(784.0)    migraines  . Insomnia   . Nasal turbinate hypertrophy 02/2011   bilat.  . Neuromuscular disorder [facility])    Dr. Jerilynn Mages. Domingo Cocking- does injections around nerve in her head for prevention of migraines - q 3 months   . OSA (obstructive sleep apnea)   . Pneumonia    hx of   . PONV (postoperative nausea and vomiting)   . Seasonal allergies   . Sleep apnea    uses BIPAP nightly; no longer using bipap, cleared from sleep apnea after bypass surgery    Past Surgical History:  Procedure Laterality Date  . ABDOMINAL HYSTERECTOMY    . CARPAL TUNNEL RELEASE Right   . DIAGNOSTIC LAPAROSCOPY    . FOOT SURGERY Right    bone removed from great toe   . GASTRIC ROUX-EN-Y N/A 05/16/2016   Procedure: LAPAROSCOPIC ROUX-EN-Y GASTRIC BYPASS WITH UPPER ENDOSCOPY;  Surgeon: Clovis Riley, MD;  [facility];  Service: General;  Laterality: N/A;  . LAPAROSCOPIC VAGINAL HYSTERECTOMY  03/14/2007  . LIPOMA EXCISION Left 08/26/2016   Procedure: EXCISION LIPOMA FROM LEFT UPPER POSTERIOR THIGH;  Surgeon: Clovis Riley, MD;  Location: [place];  Service: General;   Laterality: Left;  . MYRINGOTOMY WITH TUBE PLACEMENT Bilateral 03/02/2015   Procedure: MYRINGOTOMY WITH T-TUBE PLACEMENT;  Surgeon: Leta Baptist, MD;  Location: [place];  Service: ENT;  Laterality: Bilateral;  . NASAL SEPTOPLASTY W/ TURBINOPLASTY  02/22/2011   Procedure: NASAL SEPTOPLASTY WITH TURBINATE REDUCTION;  Surgeon: Ascencion Dike, MD;  Location: [place];  Service: ENT;  Laterality: Bilateral;  . TUBAL LIGATION  12/16/2004  . TYMPANOSTOMY TUBE PLACEMENT Bilateral 07/21/2017    There were no vitals filed for this visit.   Subjective Assessment - 08/07/17 1652    Subjective  Patient with c/o headaches and migraine for years, reports that she has had much worse over the past year.  REports weekly HA's that last 4+ days.  REports that she knows that she clinches her jaw both day and night.  She has had injections in the scalp, tried some medications    Patient Stated Goals  hurts, reports when she has the HA it really limits her function    Currently in Pain?  Yes    Pain Score  2     Pain Location  Head    Pain Orientation  Posterior    Pain Descriptors / Indicators  Aching;Pounding    Pain Type  Acute pain;Chronic pain  Pain Radiating Towards  denies    Pain Onset  More than a month ago    Pain Frequency  Constant    Aggravating Factors   loud noises. clenching jaw pain can be up to 8/10    Pain Relieving Factors  rest, some medications, dark room    Effect of Pain on Daily Activities  limits everything when it is bad         [facility] PT Assessment - 08/07/17 0001      Assessment   Medical Diagnosis  HA    Referring Provider  Jaynee Eagles    Onset Date/Surgical Date  07/08/17    Prior Therapy  no      Precautions   Precautions  None      Balance Screen   Has the patient fallen in the past 6 months  No    Has the patient had a decrease in activity level because of a fear of falling?   No    Is the patient reluctant to leave their home because of a  fear of falling?   No      Home Environment   Additional Comments  Does light housework      Prior Function   Level of Independence  Independent    Vocation  Part time employment    Vocation Requirements  at computer 2 days at office, 2 days at home      Posture/Postural Control   Posture Comments  fwd head, rounded shoulders      ROM / Strength   AROM / PROM / Strength  AROM;Strength      AROM   Overall AROM Comments  cervical flexion WNL as was rotation, side bending and extension was decreased 25% with c/o tightness      Palpation   Palpation comment  she is very tight and tener in the upper trap and the cervical mms, she is tender in the jaw as well bilaterally in the mms                Objective measurements completed on examination: See above findings.      [facility] Adult PT Treatment/Exercise - 08/07/17 0001      Modalities   Modalities  Electrical Stimulation;Moist Heat      Moist Heat Therapy   Number Minutes Moist Heat  15 Minutes    Moist Heat Location  Cervical      Electrical Stimulation   Electrical Stimulation Location  right upper trap and cervical area    Electrical Stimulation Action  IFC    Electrical Stimulation Parameters  supine    Electrical Stimulation Goals  Pain       Trigger Point Dry Needling - 08/07/17 1735    Consent Given?  Yes    Education Handout Provided  Yes    Muscles Treated Upper Body  Upper trapezius    Upper Trapezius Response  Twitch reponse elicited;Palpable increased muscle length           PT Education - 08/07/17 1735    Education Details  gave HEP for cervical and scapular retraction, shoulder shrugs and stretches    Person(s) Educated  Patient    Methods  Explanation;Demonstration;Handout    Comprehension  Verbalized understanding       PT Short Term Goals - 08/07/17 1746      PT SHORT TERM GOAL #1   Title  independent with initial HEP    Time  2    Period  Weeks    Status  New        PT  Long Term Goals - 08/07/17 1748      PT LONG TERM GOAL #1   Title  understand proper posture and body mechanics    Time  8    Period  Weeks    Status  New      PT LONG TERM GOAL #2   Title  report HA frequency decreased 25%    Time  8    Period  Weeks    Status  New      PT LONG TERM GOAL #3   Title  report HA intensity decreased 25%    Time  8    Period  Weeks    Status  New      PT LONG TERM GOAL #4   Title  independent with advanced HEP    Time  8    Period  Weeks    Status  New             Plan - 08/07/17 1736    Clinical Impression Statement  Patient reports HA's for many years, she has had injections in the scalp to help and tried medications, she reports that the HA's have gotten worse over the past 8 months, she has a HA weekly and it can last up to 4 days.  She reports that she clenches her jaw.  She is very tender in the bilateral jaw mms, the upper traps,and the cervical mms, right is worse than the left.  She has mild limitation in cervical ROM    Clinical Presentation  Stable    Clinical Decision Making  Low    Rehab Potential  Good    PT Frequency  2x / week    PT Duration  8 weeks    PT Treatment/Interventions  ADLs/Self Care Home Management;Cryotherapy;Electrical Stimulation;Moist Heat;Traction;Ultrasound;Patient/family education;Manual techniques;Dry needling    PT Next Visit Plan  start some exercise, see if DN helped    Consulted and Agree with Plan of Care  Patient       Patient will benefit from skilled therapeutic intervention in order to improve the following deficits and impairments:  Decreased range of motion, Increased fascial restricitons, Increased muscle spasms, Pain, Improper body mechanics, Postural dysfunction  Visit Diagnosis: Cervicalgia - Plan: PT plan of care cert/re-cert  Chronic tension-type headache, intractable - Plan: PT plan of care cert/re-cert     Problem List Patient Active Problem List   Diagnosis Date Noted  .  Chronic migraine without aura, with intractable migraine, so stated, with status migrainosus 06/18/2017  . GAD (generalized anxiety disorder) 04/16/2013  . Bipolar 1 disorder, depressed ([place]) 04/16/2013    Sumner Boast., PT 08/07/2017, 5:54 PM  [address] [address] [place], [place], [address] Phone: [phone]   Fax:  [phone]  Name: Margaret Lopez MRN: [id number] Date of Birth: 1970/02/06

## 2017-08-07 NOTE — Patient Instructions (Signed)

## 2017-08-08 ENCOUNTER — Telehealth: Payer: Self-pay | Admitting: Neurology

## 2017-08-08 MED ORDER — PROCHLORPERAZINE MALEATE 10 MG PO TABS: 10.0000 mg | ORAL_TABLET | Freq: Three times a day (TID) | ORAL | 3 refills | Status: DC | PRN

## 2017-08-08 MED ORDER — DIHYDROERGOTAMINE MESYLATE 4 MG/ML NA SOLN: NASAL | 12 refills | Status: DC

## 2017-08-08 NOTE — Telephone Encounter (Signed)
Faxed Migranal prescription to [facility] on [address] Received a receipt of confirmation.

## 2017-08-08 NOTE — Telephone Encounter (Signed)
Pt's had a HA since Wednesday or Thursday of last week. She used dihydroergotamine (MIGRANAL) 4 MG/ML nasal spray on Thursday and was able to get thru the day. She will use it again today, she has something she needs to get somethings done today. Pt is wanting to come tomorrow for a cocktail if possible. Please call to advise.

## 2017-08-08 NOTE — Telephone Encounter (Signed)
Pt aware prescriptions for migranal and compazine were sent to pharmacy and that Dr.Ahern said she can do less botox in her forehead next time. Pt also asked if she could take some Tylenol if needed. Pt advised ok today if needed as long as no other contraindications from her other physicians. Benadryl ok too, small doses of 12.5 mg or 25 mg as this can cause sedation especially when combined with Compazine. Pt verbalized understanding and appreciation. She will call back tomorrow with an update and possibly discuss migraine infusion.

## 2017-08-08 NOTE — Telephone Encounter (Addendum)
Per Dr. Jaynee Eagles, pt can take Compazine 10 mg TID PRN oral tablets, #30, refills 3. Also pt can take the Migranal spray 4 times daily.   Discussed with pt. She was not aware that a prescription was written for the Migranal spray. She would like this sent to her pharmacy. Pt can try Benadryl. Also discussed pt can try Compazine 10 mg TID PRN that can be used for the nausea/vomiting. She would like to only try this at night though d/t risk of drowsiness and dizziness. Also discussed other side effects including dry mouth, anxiety, insomnia. Advised pt not to drive until she knows how this medication affects her. Pt stated she would try this at night and save the Zofran for if needed during the day when she is out. Encouraged pt to call back tomorrow morning to update [name] and to discuss possible migraine cocktail if still needed. Pt appreciative. She will come today to pickup the migranal copay card. She also stated her forehead was very tight after the botox. RN advised this would wear off over time.    Migranal prescription printed and ready for MD signature.

## 2017-08-09 ENCOUNTER — Ambulatory Visit: Payer: Self-pay | Admitting: Physical Therapy

## 2017-08-09 NOTE — Telephone Encounter (Addendum)
Patient called back today stating her headache feels like it may be worse and her eyes feel heavy and hard to open. She would like a migraine cocktail. She has had several in the past at the hospital. She denies other neurological symptoms (ex. Numbness, tingling, slurred speech). She has not picked up the Compazine yet. Confirmed patient ok with being given Toradol IV, Solumedrol IV, Depacon IV, and Compazine IV. She will be here at 12:00 pm (per Otila Kluver RN in infusion) for her appointment and she reported she will have a driver.    Dr. Jaynee Eagles gave verbal order for migraine cocktail infusion today. Copy of insurance card and allergies printed.

## 2017-08-09 NOTE — Telephone Encounter (Signed)
The below message was discussed at patients 08/03/17 appointment

## 2017-08-10 ENCOUNTER — Ambulatory Visit: Payer: BLUE CROSS/BLUE SHIELD | Admitting: Physical Therapy

## 2017-08-11 DIAGNOSIS — J3081 Allergic rhinitis due to animal (cat) (dog) hair and dander: Secondary | ICD-10-CM | POA: Diagnosis not present

## 2017-08-11 DIAGNOSIS — J3089 Other allergic rhinitis: Secondary | ICD-10-CM | POA: Diagnosis not present

## 2017-08-11 DIAGNOSIS — J301 Allergic rhinitis due to pollen: Secondary | ICD-10-CM | POA: Diagnosis not present

## 2017-08-11 NOTE — Telephone Encounter (Signed)
Pt said [facility] is unable to fill migranal, it is not covered under her insurance. She said [facility] will fax over information regarding this, she is not sure if it is a PA. At this time she has nothing to take for a migraine. Please call to advise at [phone]. Pt is aware the clinic closes at noon today. She is requesting a call back today please.

## 2017-08-11 NOTE — Telephone Encounter (Signed)
Pt has called back to let RN Romelle Starcher know she has activated the card and is asking this be resolved as  Soon as possible

## 2017-08-11 NOTE — Telephone Encounter (Signed)
Called pt. She forgot to pickup the copay card from office. Pt given number to call to activate card and the BIN, PCN, GRP, & ID numbers from card. Pt will call and activate then call office back.

## 2017-08-11 NOTE — Telephone Encounter (Addendum)
Called [facility] and gave them the information from the card. They tried to use the card several times but continued to receive block stating PA needed.   PA completed on Cover My Meds requesting urgent review. KEY: [id number]. Pt aware and will be in touch with pharmacy today & this weekend to check status. Pt appreciative.

## 2017-08-14 ENCOUNTER — Ambulatory Visit: Payer: Self-pay | Admitting: Physical Therapy

## 2017-08-14 DIAGNOSIS — J301 Allergic rhinitis due to pollen: Secondary | ICD-10-CM | POA: Diagnosis not present

## 2017-08-14 DIAGNOSIS — J3081 Allergic rhinitis due to animal (cat) (dog) hair and dander: Secondary | ICD-10-CM | POA: Diagnosis not present

## 2017-08-14 DIAGNOSIS — J3089 Other allergic rhinitis: Secondary | ICD-10-CM | POA: Diagnosis not present

## 2017-08-14 NOTE — Telephone Encounter (Signed)
Pt has called for an update re: her Migranal.  RN Romelle Starcher was not available to speak with pt due to being with another pt.  RN Romelle Starcher asked that pt be told that she needed to speak with Dr Jaynee Eagles and that they said she would need to try injectable DHE before nasal.  Pt will await a call back once RN has spoken with Dr Jaynee Eagles

## 2017-08-14 NOTE — Telephone Encounter (Signed)
Migranal PA denied. Also called pharmacy and spoke with [place]. She confirmed copay card will not work. Medication is $2,700.

## 2017-08-16 ENCOUNTER — Encounter: Payer: Self-pay | Admitting: Physical Therapy

## 2017-08-16 ENCOUNTER — Other Ambulatory Visit: Payer: Self-pay | Admitting: Neurology

## 2017-08-16 ENCOUNTER — Ambulatory Visit: Payer: BLUE CROSS/BLUE SHIELD | Attending: Neurology | Admitting: Physical Therapy

## 2017-08-16 DIAGNOSIS — M542 Cervicalgia: Secondary | ICD-10-CM | POA: Insufficient documentation

## 2017-08-16 DIAGNOSIS — G44221 Chronic tension-type headache, intractable: Secondary | ICD-10-CM | POA: Diagnosis not present

## 2017-08-16 NOTE — Telephone Encounter (Signed)
Will check with Dr. Jaynee Eagles.

## 2017-08-16 NOTE — Telephone Encounter (Signed)
Pt called needing to status of the next step for treatment of migraines. Please call asap  Thank you

## 2017-08-16 NOTE — Addendum Note (Signed)
Addended by: Gildardo Griffes on: 08/16/2017 04:26 PM   Modules accepted: Orders

## 2017-08-16 NOTE — Telephone Encounter (Signed)
Called pt and LVM (ok per DPR) asking for call back to discuss starting Cambia, a nonsteroidal anti-inflammatory medication) for acute headache management. Asked pt to look up information and then give [name] a call back and let us know if she'd like to try it. It is a powder that mixes into water/liquid. Advised pt to make sure she is not taking acute medications more than 10x per month as it can cause rebound/medication overuse headaches. She would receive about 9 packs per month. Left office number and hours in message.

## 2017-08-16 NOTE — Telephone Encounter (Signed)
Patient returned call and stated that she looked up the [facility] and would like to try it. She stated she has basically had a constant headache for the last two weeks. It came back after her migraine cocktail last week. All she has is Tylenol to take and she takes that once a day. She is also interested in a steroid taper pack or anything that she can take as she is struggling with daily headaches right now.

## 2017-08-16 NOTE — Therapy (Signed)
[place], [place], [address] Phone: [phone]   Fax:  [phone]  Physical Therapy Treatment  Patient Details  Name: Margaret Lopez MRN: [id number] Date of Birth: 28-Mar-1970 Referring Provider: Jaynee Eagles   Encounter Date: 08/16/2017  PT End of Session - 08/16/17 1557    Visit Number  2    Date for PT Re-Evaluation  10/08/17    PT Start Time  1165    PT Stop Time  1610    PT Time Calculation (min)  55 min    Activity Tolerance  Patient tolerated treatment well    Behavior During Therapy  [facility] for tasks assessed/performed       Past Medical History:  Diagnosis Date  . Anxiety   . Bipolar disorder ([place])   . Depression   . Deviated nasal septum 02/2011  . Generalized anxiety disorder 04/16/2013  . GERD (gastroesophageal reflux disease)   . Headache(784.0)    migraines  . Insomnia   . Nasal turbinate hypertrophy 02/2011   bilat.  . Neuromuscular disorder [facility])    Dr. Jerilynn Mages. Domingo Cocking- does injections around nerve in her head for prevention of migraines - q 3 months   . OSA (obstructive sleep apnea)   . Pneumonia    hx of   . PONV (postoperative nausea and vomiting)   . Seasonal allergies   . Sleep apnea    uses BIPAP nightly; no longer using bipap, cleared from sleep apnea after bypass surgery    Past Surgical History:  Procedure Laterality Date  . ABDOMINAL HYSTERECTOMY    . CARPAL TUNNEL RELEASE Right   . DIAGNOSTIC LAPAROSCOPY    . FOOT SURGERY Right    bone removed from great toe   . GASTRIC ROUX-EN-Y N/A 05/16/2016   Procedure: LAPAROSCOPIC ROUX-EN-Y GASTRIC BYPASS WITH UPPER ENDOSCOPY;  Surgeon: Clovis Riley, MD;  [facility];  Service: General;  Laterality: N/A;  . LAPAROSCOPIC VAGINAL HYSTERECTOMY  03/14/2007  . LIPOMA EXCISION Left 08/26/2016   Procedure: EXCISION LIPOMA FROM LEFT UPPER POSTERIOR THIGH;  Surgeon: Clovis Riley, MD;  Location: [place];  Service: General;   Laterality: Left;  . MYRINGOTOMY WITH TUBE PLACEMENT Bilateral 03/02/2015   Procedure: MYRINGOTOMY WITH T-TUBE PLACEMENT;  Surgeon: Leta Baptist, MD;  Location: [place];  Service: ENT;  Laterality: Bilateral;  . NASAL SEPTOPLASTY W/ TURBINOPLASTY  02/22/2011   Procedure: NASAL SEPTOPLASTY WITH TURBINATE REDUCTION;  Surgeon: Ascencion Dike, MD;  Location: [place];  Service: ENT;  Laterality: Bilateral;  . TUBAL LIGATION  12/16/2004  . TYMPANOSTOMY TUBE PLACEMENT Bilateral 07/21/2017    There were no vitals filed for this visit.  Subjective Assessment - 08/16/17 1516    Subjective  Pt reports that's he felt ok after last treatment, but around 9:30 that night she experienced a dull headache that became very sharp all of a sudden    Currently in Pain?  Yes    Pain Score  5     Pain Location  Head    Pain Orientation  Posterior                       [facility] Adult PT Treatment/Exercise - 08/16/17 0001      Exercises   Exercises  Neck      Neck Exercises: Machines for Strengthening   UBE (Upper Arm Bike)  L1 63min each way  Neck Exercises: Standing   Other Standing Exercises  shoulder ext, er, flex,  and rowx 2x10       Neck Exercises: Supine   Neck Retraction  10 reps;3 secs      Moist Heat Therapy   Number Minutes Moist Heat  15 Minutes    Moist Heat Location  Cervical      Electrical Stimulation   Electrical Stimulation Location  right upper trap and cervical area    Electrical Stimulation Action  IFC    Electrical Stimulation Parameters  supine    Electrical Stimulation Goals  Pain      Manual Therapy   Manual Therapy  Soft tissue mobilization;Passive ROM;Manual Traction    Soft tissue mobilization  posterior cervical para spinales    Passive ROM  cervical spine full AROM    Manual Traction  cervical spine 5x10'' some with rotation               PT Short Term Goals - 08/07/17 1746      PT SHORT TERM GOAL #1   Title   independent with initial HEP    Time  2    Period  Weeks    Status  New        PT Long Term Goals - 08/07/17 1748      PT LONG TERM GOAL #1   Title  understand proper posture and body mechanics    Time  8    Period  Weeks    Status  New      PT LONG TERM GOAL #2   Title  report HA frequency decreased 25%    Time  8    Period  Weeks    Status  New      PT LONG TERM GOAL #3   Title  report HA intensity decreased 25%    Time  8    Period  Weeks    Status  New      PT LONG TERM GOAL #4   Title  independent with advanced HEP    Time  8    Period  Weeks    Status  New            Plan - 08/16/17 1558    Clinical Impression Statement  Pt tolerated an initial progression to TE well. Pt had a positive response to exercises reporting a improvement with her headache. Full PROM noted with cervical spine. Postural cues needed with standing rows. reports some discomfort with occipital release.    Rehab Potential  Good    PT Frequency  2x / week    PT Duration  8 weeks    PT Next Visit Plan  postural strengthening and STM       Patient will benefit from skilled therapeutic intervention in order to improve the following deficits and impairments:  Decreased range of motion, Increased fascial restricitons, Increased muscle spasms, Pain, Improper body mechanics, Postural dysfunction  Visit Diagnosis: Cervicalgia  Chronic tension-type headache, intractable     Problem List Patient Active Problem List   Diagnosis Date Noted  . Chronic migraine without aura, with intractable migraine, so stated, with status migrainosus 06/18/2017  . GAD (generalized anxiety disorder) 04/16/2013  . Bipolar 1 disorder, depressed ([address]) 04/16/2013    Scot Jun, PTA 08/16/2017, 4:00 PM  [address] [address] [place], [place], [address] Phone: [phone]   Fax:  [phone]  Name: Margaret  ANNAELLE Lopez MRN:  [id number] Date of Birth: 09/22/1970

## 2017-08-16 NOTE — Telephone Encounter (Signed)
Spoke with Dr. Jaynee Eagles, pt needs to come into office to be seen. We have an opening tomorrow @ 3:30. Called pt and LVM (ok per DPR) asking for call back to get her scheduled so things can be discussed as she is having headaches despite being on several meds. Left office number and hours in message.

## 2017-08-16 NOTE — Telephone Encounter (Signed)
Let's try cambia acutely, please remind her she should not be taking acute medications more than 10x a month to avoid rebound/overuse headache

## 2017-08-16 NOTE — Telephone Encounter (Signed)
Does she need it now? We can give her a few samples as a backup but Is she having headaches? She should not be taking analgesics daily due to rebound, how much is she taking and needing?

## 2017-08-17 ENCOUNTER — Encounter (INDEPENDENT_AMBULATORY_CARE_PROVIDER_SITE_OTHER): Payer: Self-pay

## 2017-08-17 ENCOUNTER — Ambulatory Visit (INDEPENDENT_AMBULATORY_CARE_PROVIDER_SITE_OTHER): Payer: BLUE CROSS/BLUE SHIELD | Admitting: Neurology

## 2017-08-17 ENCOUNTER — Encounter: Payer: Self-pay | Admitting: Neurology

## 2017-08-17 VITALS — BP 104/59 | HR 77 | Ht 63.0 in | Wt 133.0 lb

## 2017-08-17 DIAGNOSIS — G43711 Chronic migraine without aura, intractable, with status migrainosus: Secondary | ICD-10-CM | POA: Diagnosis not present

## 2017-08-17 MED ORDER — DIHYDROERGOTAMINE MESYLATE 1 MG/ML IJ SOLN
1.0000 mg | Freq: Once | INTRAMUSCULAR | Status: AC
  Administered 2017-08-17: 1 mg via INTRAMUSCULAR

## 2017-08-17 MED ORDER — ERENUMAB-AOOE 140 MG/ML ~~LOC~~ SOAJ: 140.0000 mg | SUBCUTANEOUS | 11 refills | Status: DC

## 2017-08-17 MED ORDER — DIHYDROERGOTAMINE MESYLATE 1 MG/ML IJ SOLN: 1.0000 mg | Freq: Once | INTRAMUSCULAR | Status: DC

## 2017-08-17 NOTE — Progress Notes (Signed)
[facility]    Provider:  Dr Jaynee Eagles Referring Provider: Lavone Orn, MD Primary Care Physician:  Lavone Orn, MD  CC:  Migraines  Interval history: Patient has had a migraine for over a week, severe, had infusion last week and still didn't help. Discussed options today, will perform nerve block, continue botox, and discussed aimovig today.  Performed by Dr. Jaynee Eagles M.D.  All procedures a documented blood were medically necessary, reasonable and appropriate based on the patient's history, medical diagnosis and physician opinion. Verbal informed consent was obtained from the patient, patient was informed of potential risk of procedure, including bruising, bleeding, hematoma formation, infection, muscle weakness, muscle pain, numbness, transient hypertension, transient hyperglycemia and transient insomnia among others. All areas injected were topically clean with isopropyl rubbing alcohol. Nonsterile nonlatex gloves were worn during the procedure.  1. Greater occipital nerve block [phone]). The greater occipital nerve site was identified at the nuchal line medial to the occipital artery. Medication was injected into the left and right occipital nerve areas and suboccipital areas. Patient's condition is associated with inflammation of the greater occipital nerve and associated multiple groups. Injection was deemed medically necessary, reasonable and appropriate. Injection represents a separate and unique surgical service.  2. Lesser occipital nerve block [phone]). The lesser occipital nerve site was identified approximately 2 cm lateral to the greater occipital nerve. Occasion was injected into the left and right occipital nerve areas. Patient's condition is associated with inflammation of the lesser occipital nerve and associated muscle groups. Injection was deemed medically necessary, reasonable and appropriate. Injection represents a separate and unique surgical service.   3.  Auriculotemporal nerve block ([id number]): The Auriculotemporal nerve site was identified along the posterior margin of the sternocleidomastoid muscle toward the base of the ear. Medication was injected into the left and right radicular temporal nerve areas. Patient's condition is associated with inflammation of the Auriculotemporal Nerve and associated muscle groups. Injection was deemed medically necessary, reasonable and appropriate. Injection represents a separate and unique surgical service.  4. Supraorbital nerve block ([id number]): Supraorbital nerve site was identified along the incision of the frontal bone on the orbital/supraorbital ridge. Medication was injected into the left and right supraorbital nerve areas. Patient's condition is associated with inflammation of the supraorbital and associated muscle groups. Injection was deemed medically necessary, reasonable and appropriate. Injection represents a separate and unique surgical service.    HPI:  Margaret Lopez is a 47 y.o. female here as a referral from Dr. Laurann Montana for migraines. PMHx GAD, anxiety, Bipolar disorder, depression, insomnia, OSA, pneumonia, bipolar disorder, tobacco dependence, morbid obesity, sinusitis. Started at age 4-13. Strong FHx migraines. Patient [facility]. No aura. They are triggered by weather and sleeping changes, they have changed in the last few years. Her skin feels very sensitive. Migraines start on the right side, typically start in the back and spread to behind the eye and may spread to the whole head, pulsating, pounding, movement makes it worse, light sensitivity, nausea, sound senistivity and she is getting stabbing over the right.  She has 10 migraine days a month, 15 headache days a month,she has been getting nerve blocks by Dr. Tommi Rumps and not getting better. Out of the 10 migraines days, 6-7 severe affecting her life, putting her in bed. Laying down helps, a dark room helps.They can last  24 hours and even have lasted 5-7 days. She has had a hysterectomy. She has had bypass surgery. No medication overuse. Ongoing for over  a year.  Tried: Zonisamide, Cataflam, nerve blocks.flexeril, depakote, zofran, gabapentin, tizanidine, scopolamine, Imitrex not effective, Maxalt chest pain, zoloft, paxil, blood pressure medications contraindicated due to her hypotension, reglan helps, Topamax.  Reviewed notes, labs and imaging from outside physicians, which showed:   Reviewed referring physician's notes.  She reports headache typical of her migraines for 1 week tried all her abortive medication without relief she is consulted with headache specialist who trialed an NSAID still with no relief she denies any recent trauma nausea emesis sensitivity to light.  The episode can last more than 2 days and is constant and continuous.  No alcohol use.  Former smoker.  She reported nausea but no vomiting, reported neck stiffness and headaches.  Physical and neurologic exam were normal.  Looks like she was given a Toradol injection of Benadryl injection and a Reglan injection for acute management.  Also felt better after IV fluids and medications.  She has been in the emergency room for treatments.  cmp and cbc unremarkable 11/2016  Review of Systems: Patient complains of symptoms per HPI as well as the following symptoms: headache, fatigue, decreased energy. Pertinent negatives and positives per HPI. All others negative.   Social History   Socioeconomic History  . Marital status: Legally Separated    Spouse name: Not on file  . Number of children: 2  . Years of education: Not on file  . Highest education level: Some college, no degree  Occupational History  . Not on file  Social Needs  . Financial resource strain: Not on file  . Food insecurity:    Worry: Not on file    Inability: Not on file  . Transportation needs:    Medical: Not on file    Non-medical: Not on file  Tobacco Use  . Smoking  status: Former Smoker    Packs/day: 0.50    Years: 4.00    Pack years: 2.00    Types: Cigarettes    Last attempt to quit: 11/12/2015    Years since quitting: 1.7  . Smokeless tobacco: Never Used  Substance and Sexual Activity  . Alcohol use: No    Alcohol/week: 0.0 standard drinks    Comment: once a month or less  . Drug use: No  . Sexual activity: Yes    Partners: Male    Birth control/protection: Surgical    Comment: Hysterectomy  Lifestyle  . Physical activity:    Days per week: 5 days    Minutes per session: 40 min  . Stress: Only a little  Relationships  . Social connections:    Talks on phone: Not on file    Gets together: Not on file    Attends religious service: Not on file    Active member of club or organization: Not on file    Attends meetings of clubs or organizations: Not on file    Relationship status: Not on file  . Intimate partner violence:    Fear of current or ex partner: Not on file    Emotionally abused: Not on file    Physically abused: Not on file    Forced sexual activity: Not on file  Other Topics Concern  . Not on file  Social History Narrative   Lives at home with boyfriend   Right handed   Caffeine: 2 cups daily    Family History  Problem Relation Age of Onset  . ADD / ADHD Sister   . Hypertension Mother   . Sarcoidosis Mother   .  Rheum arthritis Mother   . Hypertension Father   . Heart Problems Father   . Alcohol abuse Maternal Grandmother   . Bipolar disorder Maternal Grandmother   . Breast cancer Maternal Grandmother   . Breast cancer Maternal Aunt   . Heart Problems Other        dad's side of family  . Suicidality Neg Hx     Past Medical History:  Diagnosis Date  . Anxiety   . Bipolar disorder ([place])   . Depression   . Deviated nasal septum 02/2011  . Generalized anxiety disorder 04/16/2013  . GERD (gastroesophageal reflux disease)   . Headache(784.0)    migraines  . Insomnia   . Nasal turbinate hypertrophy 02/2011    bilat.  . Neuromuscular disorder [facility])    Dr. Jerilynn Mages. Domingo Cocking- does injections around nerve in her head for prevention of migraines - q 3 months   . OSA (obstructive sleep apnea)   . Pneumonia    hx of   . PONV (postoperative nausea and vomiting)   . Seasonal allergies   . Sleep apnea    uses BIPAP nightly; no longer using bipap, cleared from sleep apnea after bypass surgery    Past Surgical History:  Procedure Laterality Date  . ABDOMINAL HYSTERECTOMY    . CARPAL TUNNEL RELEASE Right   . DIAGNOSTIC LAPAROSCOPY    . FOOT SURGERY Right    bone removed from great toe   . GASTRIC ROUX-EN-Y N/A 05/16/2016   Procedure: LAPAROSCOPIC ROUX-EN-Y GASTRIC BYPASS WITH UPPER ENDOSCOPY;  Surgeon: Clovis Riley, MD;  Location: [facility];  Service: General;  Laterality: N/A;  . LAPAROSCOPIC VAGINAL HYSTERECTOMY  03/14/2007  . LIPOMA EXCISION Left 08/26/2016   Procedure: EXCISION LIPOMA FROM LEFT UPPER POSTERIOR THIGH;  Surgeon: Clovis Riley, MD;  Location: [place];  Service: General;  Laterality: Left;  . MYRINGOTOMY WITH TUBE PLACEMENT Bilateral 03/02/2015   Procedure: MYRINGOTOMY WITH T-TUBE PLACEMENT;  Surgeon: Leta Baptist, MD;  Location: [place];  Service: ENT;  Laterality: Bilateral;  . NASAL SEPTOPLASTY W/ TURBINOPLASTY  02/22/2011   Procedure: NASAL SEPTOPLASTY WITH TURBINATE REDUCTION;  Surgeon: Ascencion Dike, MD;  Location: [place];  Service: ENT;  Laterality: Bilateral;  . TUBAL LIGATION  12/16/2004  . TYMPANOSTOMY TUBE PLACEMENT Bilateral 07/21/2017    Current Outpatient Medications  Medication Sig Dispense Refill  . ALPRAZolam (XANAX) 0.5 MG tablet Take 1 tablet (0.5 mg total) by mouth at bedtime as needed for anxiety. 30 tablet 1  . divalproex (DEPAKOTE ER) 500 MG 24 hr tablet Take 3 tablets (1,500 mg total) by mouth daily. 90 tablet 2  . ipratropium (ATROVENT) 0.06 % nasal spray Place 2 sprays into both nostrils daily as needed. Allergies, runny nose.  4  .  levocetirizine (XYZAL) 5 MG tablet Take 5 mg by mouth at bedtime.     [name] lithium carbonate (LITHOBID) 300 MG CR tablet Take 2 tablets (600 mg total) by mouth every evening. 60 tablet 2  . Multiple Vitamins-Minerals (OPURITY BYPASS OPTIMIZED) CHEW Chew 1 tablet by mouth daily.    [name] zonisamide (ZONEGRAN) 25 MG capsule Take 50 mg by mouth at bedtime. Working up to 3 tablets at bedtime  2  . acetaminophen (TYLENOL) 500 MG tablet Take 1,000 mg by mouth every 6 (six) hours as needed (for pain/headaches).     [name] albuterol (PROVENTIL HFA;VENTOLIN HFA) 108 (90 Base) MCG/ACT inhaler Inhale 1-2 puffs into the lungs every 6 (six) hours  as needed for wheezing or shortness of breath.    . cyclobenzaprine (FLEXERIL) 10 MG tablet Take 5-10 mg by mouth at bedtime as needed. TMJ  2  . dihydroergotamine (MIGRANAL) 4 MG/ML nasal spray 1 spray into each nostril. Don't tilt your head back, sniff through your nose, or blow your nose. May repeat in 15 minutes. Max 4 sprays/day. (Patient not taking: Reported on 08/17/2017) 8 mL 12  . EPINEPHrine 0.3 mg/0.3 mL IJ SOAJ injection Inject 0.3 mg into the muscle daily as needed (for anaphylatic reactions.).   1  . hydrOXYzine (ATARAX/VISTARIL) 25 MG tablet Take 25 mg by mouth at bedtime as needed for itching.   3  . ondansetron (ZOFRAN-ODT) 4 MG disintegrating tablet Take 4 mg by mouth every 6 (six) hours as needed for nausea or vomiting.   0  . prochlorperazine (COMPAZINE) 10 MG tablet Take 1 tablet (10 mg total) by mouth 3 (three) times daily as needed for nausea or vomiting (migraine). MAY CAUSE SEDATION. 30 tablet 3  . valACYclovir (VALTREX) 500 MG tablet Take 500 mg by mouth See admin instructions. Takes 500mg  daily for 5 days when she has a flare up.  3   No current facility-administered medications for this visit.     Allergies as of 08/17/2017 - Review Complete 08/17/2017  Allergen Reaction Noted  . Doxazosin Nausea And Vomiting   . Morphine and related Itching 05/10/2016    . Phenergan [promethazine hcl]  05/10/2016  . Aloe Hives 02/18/2011  . Penicillins Rash 02/18/2011  . Sulfa antibiotics Rash 02/18/2011    Vitals: BP (!) 104/59 (BP Location: Right Arm, Patient Position: Sitting)   Pulse 77   Ht 5\' 3"  (1.6 m)   Wt 133 lb (60.3 kg)   BMI 23.56 kg/m  Last Weight:  Wt Readings from Last 1 Encounters:  08/17/17 133 lb (60.3 kg)   Last Height:   Ht Readings from Last 1 Encounters:  08/17/17 5\' 3"  (1.6 m)   Physical exam: Exam: Gen: NAD, conversant, well nourised, well groomed                     CV: RRR, no MRG. No Carotid Bruits. No peripheral edema, warm, nontender Eyes: Conjunctivae clear without exudates or hemorrhage  Neuro: Detailed Neurologic Exam  Speech:    Speech is normal; fluent and spontaneous with normal comprehension.  Cognition:    The patient is oriented to person, place, and time;     recent and remote memory intact;     language fluent;     normal attention, concentration,     fund of knowledge Cranial Nerves:    The pupils are equal, round, and reactive to light. The fundi are normal and spontaneous venous pulsations are present. Visual fields are full to finger confrontation. Extraocular movements are intact. Trigeminal sensation is intact and the muscles of mastication are normal. The face is symmetric. The palate elevates in the midline. Hearing intact. Voice is normal. Shoulder shrug is normal. The tongue has normal motion without fasciculations.   Coordination:    Normal finger to nose and heel to shin. Normal rapid alternating movements.   Gait:    Heel-toe and tandem gait are normal.   Motor Observation:    No asymmetry, no atrophy, and no involuntary movements noted. Tone:    Normal muscle tone.    Posture:    Posture is normal. normal erect    Strength:    Strength is V/V in  the upper and lower limbs.      Sensation: intact to LT     Reflex Exam:  DTR's:    Deep tendon reflexes in the upper and  lower extremities are normal bilaterally.   Toes:    The toes are downgoing bilaterally.   Clonus:    Clonus is absent.       Assessment/Plan:  47 year old with chronic intractable migraines. Failed multiple medications, recommend botox for migraines  Tried: Zonisamide, Cataflam, nerve blocks.flexeril, depakote, zofran, gabapentin, tizanidine, scopolamine, Imitrex not effective, Maxalt chest pain, zoloft, paxil, blood pressure medications contraindicated due to her hypotension, reglan helps, Topamax.  Also discussed erenumab, will start today  Discussed: To prevent or relieve headaches, try the following: Cool Compress. Lie down and place a cool compress on your head.  Avoid headache triggers. If certain foods or odors seem to have triggered your migraines in the past, avoid them. A headache diary might help you identify triggers.  Include physical activity in your daily routine. Try a daily walk or other moderate aerobic exercise.  Manage stress. Find healthy ways to cope with the stressors, such as delegating tasks on your to-do list.  Practice relaxation techniques. Try deep breathing, yoga, massage and visualization.  Eat regularly. Eating regularly scheduled meals and maintaining a healthy diet might help prevent headaches. Also, drink plenty of fluids.  Follow a regular sleep schedule. Sleep deprivation might contribute to headaches Consider biofeedback. With this mind-body technique, you learn to control certain bodily functions - such as muscle tension, heart rate and blood pressure - to prevent headaches or reduce headache pain.    Proceed to emergency room if you experience new or worsening symptoms or symptoms do not resolve, if you have new neurologic symptoms or if headache is severe, or for any concerning symptom.   Provided education and documentation from American headache Society toolbox including articles on: chronic migraine medication overuse headache, chronic  migraines, prevention of migraines, behavioral and other nonpharmacologic treatments for headache.    Sarina Ill, MD  [facility] [address] [place], [place] [address]  Phone [phone] Fax [phone]  A total of 15 minutes was spent face-to-face with this patient. Over half this time was spent on counseling patient on the migraine diagnosis and different diagnostic and therapeutic options, counseling and coordination of care, risks ans benefits of management, compliance, or risk factor reduction and education.  This does not include time spent on nerve blocks.

## 2017-08-17 NOTE — Telephone Encounter (Signed)
Note pt has been scheduled today @ 3:30 pm.

## 2017-08-17 NOTE — Progress Notes (Signed)
Nerve block w/o steroid: Pt signed consent  0.5% Bupivocaine 6 mL LOT: [id number] EXP: 09/22  2% Lidocaine 6 mL LOT: [id number] EXP: 02/11/2019  Pt given DHE 1 mg/ 1 mL split into 0.5 mg doses. See MAR.  16:15 1st injection  16:25 BP 118/68 HR 70 16:27 2nd injection 16:36 BP 115/73 HR 68  Pt pain 0/10 from 5-6/10. Patient felt much better.

## 2017-08-18 ENCOUNTER — Encounter: Payer: Self-pay | Admitting: Physical Therapy

## 2017-08-18 ENCOUNTER — Ambulatory Visit: Payer: BLUE CROSS/BLUE SHIELD | Admitting: Physical Therapy

## 2017-08-18 DIAGNOSIS — M542 Cervicalgia: Secondary | ICD-10-CM

## 2017-08-18 DIAGNOSIS — G44221 Chronic tension-type headache, intractable: Secondary | ICD-10-CM | POA: Diagnosis not present

## 2017-08-18 NOTE — Therapy (Signed)
[facility] [place], [place], [address] Phone: [phone]   Fax:  [phone]  Physical Therapy Treatment  Patient Details  Name: Margaret Lopez MRN: [id number] Date of Birth: 10-05-1970 Referring Provider: Jaynee Eagles   Encounter Date: 08/18/2017  PT End of Session - 08/18/17 0813    Visit Number  3    Date for PT Re-Evaluation  10/08/17    PT Start Time  0813   in late   PT Stop Time  0853    PT Time Calculation (min)  40 min    Activity Tolerance  Patient tolerated treatment well       Past Medical History:  Diagnosis Date  . Anxiety   . Bipolar disorder ([place])   . Depression   . Deviated nasal septum 02/2011  . Generalized anxiety disorder 04/16/2013  . GERD (gastroesophageal reflux disease)   . Headache(784.0)    migraines  . Insomnia   . Nasal turbinate hypertrophy 02/2011   bilat.  . Neuromuscular disorder [facility])    Dr. Jerilynn Mages. Domingo Cocking- does injections around nerve in her head for prevention of migraines - q 3 months   . OSA (obstructive sleep apnea)   . Pneumonia    hx of   . PONV (postoperative nausea and vomiting)   . Seasonal allergies   . Sleep apnea    uses BIPAP nightly; no longer using bipap, cleared from sleep apnea after bypass surgery    Past Surgical History:  Procedure Laterality Date  . ABDOMINAL HYSTERECTOMY    . CARPAL TUNNEL RELEASE Right   . DIAGNOSTIC LAPAROSCOPY    . FOOT SURGERY Right    bone removed from great toe   . GASTRIC ROUX-EN-Y N/A 05/16/2016   Procedure: LAPAROSCOPIC ROUX-EN-Y GASTRIC BYPASS WITH UPPER ENDOSCOPY;  Surgeon: Clovis Riley, MD;  [facility];  Service: General;  Laterality: N/A;  . LAPAROSCOPIC VAGINAL HYSTERECTOMY  03/14/2007  . LIPOMA EXCISION Left 08/26/2016   Procedure: EXCISION LIPOMA FROM LEFT UPPER POSTERIOR THIGH;  Surgeon: Clovis Riley, MD;  Location: [place];  Service: General;  Laterality: Left;  . MYRINGOTOMY WITH TUBE PLACEMENT  Bilateral 03/02/2015   Procedure: MYRINGOTOMY WITH T-TUBE PLACEMENT;  Surgeon: Leta Baptist, MD;  Location: [place];  Service: ENT;  Laterality: Bilateral;  . NASAL SEPTOPLASTY W/ TURBINOPLASTY  02/22/2011   Procedure: NASAL SEPTOPLASTY WITH TURBINATE REDUCTION;  Surgeon: Ascencion Dike, MD;  Location: [place];  Service: ENT;  Laterality: Bilateral;  . TUBAL LIGATION  12/16/2004  . TYMPANOSTOMY TUBE PLACEMENT Bilateral 07/21/2017    There were no vitals filed for this visit.  Subjective Assessment - 08/18/17 0813    Subjective  Pt reports that ever since the DN she has had increased headaches and overall pina.  Saw MD and had injections yesterday she has some relief with these    Patient Stated Goals  hurts, reports when she has the HA it really limits her function    Currently in Pain?  Yes    Pain Score  4     Pain Location  Head    Pain Descriptors / Indicators  Pressure    Pain Type  Chronic pain;Acute pain    Pain Onset  More than a month ago    Pain Frequency  Constant    Aggravating Factors   DN, clenching    Pain Relieving Factors  injections  [place] Adult PT Treatment/Exercise - 08/18/17 0001      Exercises   Exercises  Neck      Neck Exercises: Supine   Cervical Isometrics  Flexion;10 reps;5 secs    Other Supine Exercise  10 reps shoulder presses 5 sec holds      Neck Exercises: Prone   Axial Exension  10 reps      Modalities   Modalities  Electrical Stimulation;Moist Heat      Moist Heat Therapy   Number Minutes Moist Heat  15 Minutes    Moist Heat Location  Cervical      Electrical Stimulation   Electrical Stimulation Location  cervical    Electrical Stimulation Action  IFC    Electrical Stimulation Parameters  to tolerance supine position    Electrical Stimulation Goals  Tone;Pain      Manual Therapy   Manual Therapy  Soft tissue mobilization;Joint mobilization    Joint Mobilization  cervical  and thoracic CPA, bilat UPA grade II, then grade III at C3 CPA and left UPA    Soft tissue mobilization  STM to bilat cervical paraspinals in prone, TPR to perioccipital muscles.       Neck Exercises: Stretches   Other Neck Stretches  chest/thoracic openers over half pool noodle             PT Education - 08/18/17 0832    Education Details  HEP self thoracic mob over towel    Person(s) Educated  Patient    Methods  Explanation;Demonstration;Handout    Comprehension  Returned demonstration;Verbalized understanding       PT Short Term Goals - 08/07/17 1746      PT SHORT TERM GOAL #1   Title  independent with initial HEP    Time  2    Period  Weeks    Status  New        PT Long Term Goals - 08/07/17 1748      PT LONG TERM GOAL #1   Title  understand proper posture and body mechanics    Time  8    Period  Weeks    Status  New      PT LONG TERM GOAL #2   Title  report HA frequency decreased 25%    Time  8    Period  Weeks    Status  New      PT LONG TERM GOAL #3   Title  report HA intensity decreased 25%    Time  8    Period  Weeks    Status  New      PT LONG TERM GOAL #4   Title  independent with advanced HEP    Time  8    Period  Weeks    Status  New            Plan - 08/18/17 0840    Clinical Impression Statement  Pt with pain at C3 with cervical mobs, this decreased by 50% after mobilization.  She continues with tight muscles periocciput and had increase thoracic curvature placing stress on the neck.  Did well with todays treatment.     Rehab Potential  Good    PT Frequency  2x / week    PT Duration  8 weeks    PT Treatment/Interventions  ADLs/Self Care Home Management;Cryotherapy;Electrical Stimulation;Moist Heat;Traction;Ultrasound;Patient/family education;Manual techniques;Dry needling    PT Next Visit Plan  cont with manual work to neck, chest and thoracic openers along  with postural strengthening    Consulted and Agree with Plan of Care   Patient       Patient will benefit from skilled therapeutic intervention in order to improve the following deficits and impairments:  Decreased range of motion, Increased fascial restricitons, Increased muscle spasms, Pain, Improper body mechanics, Postural dysfunction  Visit Diagnosis: Chronic tension-type headache, intractable  Cervicalgia     Problem List Patient Active Problem List   Diagnosis Date Noted  . Chronic migraine without aura, with intractable migraine, so stated, with status migrainosus 06/18/2017  . GAD (generalized anxiety disorder) 04/16/2013  . Bipolar 1 disorder, depressed ([place]) 04/16/2013    Jeral Pinch PT  08/18/2017, 8:42 AM  [facility] [place], [place], [address] Phone: [phone]   Fax:  [phone]  Name: Margaret Lopez MRN: [id number] Date of Birth: 1970-05-24

## 2017-08-21 ENCOUNTER — Encounter: Payer: Self-pay | Admitting: Physical Therapy

## 2017-08-21 ENCOUNTER — Ambulatory Visit: Payer: BLUE CROSS/BLUE SHIELD | Admitting: Physical Therapy

## 2017-08-21 DIAGNOSIS — G44221 Chronic tension-type headache, intractable: Secondary | ICD-10-CM

## 2017-08-21 DIAGNOSIS — M542 Cervicalgia: Secondary | ICD-10-CM | POA: Diagnosis not present

## 2017-08-21 NOTE — Therapy (Signed)
[place], [place], [address] Phone: [phone]   Fax:  [phone]  Physical Therapy Treatment  Patient Details  Name: RASHIA MCKESSON MRN: [id number] Date of Birth: [birth year] Referring Provider: Jaynee Eagles   Encounter Date: 08/21/2017  PT End of Session - 08/21/17 1647    Visit Number  4    Date for PT Re-Evaluation  10/08/17    PT Start Time  1615    PT Stop Time  1703    PT Time Calculation (min)  48 min    Activity Tolerance  Patient tolerated treatment well    Behavior During Therapy  [facility] for tasks assessed/performed       Past Medical History:  Diagnosis Date  . Anxiety   . Bipolar disorder ([place])   . Depression   . Deviated nasal septum 02/2011  . Generalized anxiety disorder 04/16/2013  . GERD (gastroesophageal reflux disease)   . Headache(784.0)    migraines  . Insomnia   . Nasal turbinate hypertrophy 02/2011   bilat.  . Neuromuscular disorder [facility])    Dr. Jerilynn Mages. Domingo Cocking- does injections around nerve in her head for prevention of migraines - q 3 months   . OSA (obstructive sleep apnea)   . Pneumonia    hx of   . PONV (postoperative nausea and vomiting)   . Seasonal allergies   . Sleep apnea    uses BIPAP nightly; no longer using bipap, cleared from sleep apnea after bypass surgery    Past Surgical History:  Procedure Laterality Date  . ABDOMINAL HYSTERECTOMY    . CARPAL TUNNEL RELEASE Right   . DIAGNOSTIC LAPAROSCOPY    . FOOT SURGERY Right    bone removed from great toe   . GASTRIC ROUX-EN-Y N/A 05/16/2016   Procedure: LAPAROSCOPIC ROUX-EN-Y GASTRIC BYPASS WITH UPPER ENDOSCOPY;  Surgeon: Clovis Riley, MD;  [facility];  Service: General;  Laterality: N/A;  . LAPAROSCOPIC VAGINAL HYSTERECTOMY  03/14/2007  . LIPOMA EXCISION Left 08/26/2016   Procedure: EXCISION LIPOMA FROM LEFT UPPER POSTERIOR THIGH;  Surgeon: Clovis Riley, MD;  Location: [place];  Service: General;   Laterality: Left;  . MYRINGOTOMY WITH TUBE PLACEMENT Bilateral 03/02/2015   Procedure: MYRINGOTOMY WITH T-TUBE PLACEMENT;  Surgeon: Leta Baptist, MD;  Location: [place];  Service: ENT;  Laterality: Bilateral;  . NASAL SEPTOPLASTY W/ TURBINOPLASTY  02/22/2011   Procedure: NASAL SEPTOPLASTY WITH TURBINATE REDUCTION;  Surgeon: Ascencion Dike, MD;  Location: [place];  Service: ENT;  Laterality: Bilateral;  . TUBAL LIGATION  12/16/2004  . TYMPANOSTOMY TUBE PLACEMENT Bilateral 07/21/2017    There were no vitals filed for this visit.  Subjective Assessment - 08/21/17 1619    Subjective  Patient reports that she has continued to have pain and HA.  She liked the opeing up exercises    Currently in Pain?  Yes    Pain Score  3     Pain Location  Head    Pain Orientation  Posterior    Aggravating Factors   clenching                       [facility] Adult PT Treatment/Exercise - 08/21/17 0001      Exercises   Exercises  Neck      Neck Exercises: Machines for Strengthening   UBE (Upper Arm Bike)  L1 37min each way  Neck Exercises: Theraband   Shoulder Extension  20 reps;Red    Rows  20 reps;Red    Shoulder External Rotation  20 reps;Red      Neck Exercises: Standing   Other Standing Exercises  shrugs with 3# with upper trap and levator streches    Other Standing Exercises  wand exercised with PT overpressure for extension       Neck Exercises: Seated   W Back  20 reps    W Back Limitations  done in standing      Neck Exercises: Supine   Cervical Isometrics  20 reps    Cervical Isometrics Limitations  head on ball      Modalities   Modalities  Electrical Stimulation;Moist Heat      Moist Heat Therapy   Number Minutes Moist Heat  15 Minutes    Moist Heat Location  Cervical      Electrical Stimulation   Electrical Stimulation Location  cervical    Electrical Stimulation Action  IFC    Electrical Stimulation Parameters  supine    Electrical  Stimulation Goals  Tone;Pain      Manual Therapy   Manual Therapy  Soft tissue mobilization;Joint mobilization    Soft tissue mobilization  STM to bilat cervical paraspinals in prone, TPR to perioccipital muscles.  and to bilateral TMJ    Manual Traction  cervical spine 5x10'' some with rotation               PT Short Term Goals - 08/21/17 1648      PT SHORT TERM GOAL #1   Title  independent with initial HEP    Status  Achieved        PT Long Term Goals - 08/07/17 1748      PT LONG TERM GOAL #1   Title  understand proper posture and body mechanics    Time  8    Period  Weeks    Status  New      PT LONG TERM GOAL #2   Title  report HA frequency decreased 25%    Time  8    Period  Weeks    Status  New      PT LONG TERM GOAL #3   Title  report HA intensity decreased 25%    Time  8    Period  Weeks    Status  New      PT LONG TERM GOAL #4   Title  independent with advanced HEP    Time  8    Period  Weeks    Status  New            Plan - 08/21/17 1647    Clinical Impression Statement  Patient feels that the current treatment is good and she needs more strength, tolerates the activity well but does fatigue quickly, needs a lot of cues to go slow and not elevate the shoulders, she is very tender with knots in the upper traps and in the left TMJ    PT Next Visit Plan  cont with manual work to neck, chest and thoracic openers along with postural strengthening    Consulted and Agree with Plan of Care  Patient       Patient will benefit from skilled therapeutic intervention in order to improve the following deficits and impairments:  Decreased range of motion, Increased fascial restricitons, Increased muscle spasms, Pain, Improper body mechanics, Postural dysfunction  Visit Diagnosis: Chronic tension-type headache, intractable  Cervicalgia     Problem List Patient Active Problem List   Diagnosis Date Noted  . Chronic migraine without aura, with  intractable migraine, so stated, with status migrainosus 06/18/2017  . GAD (generalized anxiety disorder) 04/16/2013  . Bipolar 1 disorder, depressed ([place]) 04/16/2013    Sumner Boast., PT 08/21/2017, 4:49 PM  [place] [address] [place], [place], [address] Phone: [phone]   Fax:  [phone]  Name: DARREN NODAL MRN: [id number] Date of Birth: September 18, 1970

## 2017-08-23 ENCOUNTER — Telehealth: Payer: Self-pay | Admitting: *Deleted

## 2017-08-23 NOTE — Telephone Encounter (Signed)
PA approved for [employer] through 11/20/2017.  BCBS [id number].  Member TV#[id number].  Covermymeds key# [id number].

## 2017-08-24 ENCOUNTER — Encounter: Payer: Self-pay | Admitting: Physical Therapy

## 2017-08-24 ENCOUNTER — Ambulatory Visit: Payer: BLUE CROSS/BLUE SHIELD | Admitting: Physical Therapy

## 2017-08-24 DIAGNOSIS — J3089 Other allergic rhinitis: Secondary | ICD-10-CM | POA: Diagnosis not present

## 2017-08-24 DIAGNOSIS — J301 Allergic rhinitis due to pollen: Secondary | ICD-10-CM | POA: Diagnosis not present

## 2017-08-24 DIAGNOSIS — G44221 Chronic tension-type headache, intractable: Secondary | ICD-10-CM

## 2017-08-24 DIAGNOSIS — M542 Cervicalgia: Secondary | ICD-10-CM

## 2017-08-24 DIAGNOSIS — J3081 Allergic rhinitis due to animal (cat) (dog) hair and dander: Secondary | ICD-10-CM | POA: Diagnosis not present

## 2017-08-24 MED ORDER — DICLOFENAC POTASSIUM(MIGRAINE) 50 MG PO PACK: PACK | ORAL | 3 refills | Status: DC

## 2017-08-24 NOTE — Addendum Note (Signed)
Addended by: Gildardo Griffes on: 08/24/2017 05:18 PM   Modules accepted: Orders

## 2017-08-24 NOTE — Therapy (Signed)
[place], [place], [address] Phone: [phone]   Fax:  [phone]  Physical Therapy Treatment  Patient Details  Name: Margaret Lopez MRN: [id number] Date of Birth: [birth year] Referring Provider: Jaynee Eagles   Encounter Date: 08/24/2017  PT End of Session - 08/24/17 1110    Visit Number  5    Date for PT Re-Evaluation  10/08/17    PT Start Time  1110    PT Stop Time  1208    PT Time Calculation (min)  58 min    Activity Tolerance  Patient tolerated treatment well       Past Medical History:  Diagnosis Date  . Anxiety   . Bipolar disorder ([place])   . Depression   . Deviated nasal septum 02/2011  . Generalized anxiety disorder 04/16/2013  . GERD (gastroesophageal reflux disease)   . Headache(784.0)    migraines  . Insomnia   . Nasal turbinate hypertrophy 02/2011   bilat.  . Neuromuscular disorder [facility])    Dr. Jerilynn Mages. Domingo Cocking- does injections around nerve in her head for prevention of migraines - q 3 months   . OSA (obstructive sleep apnea)   . Pneumonia    hx of   . PONV (postoperative nausea and vomiting)   . Seasonal allergies   . Sleep apnea    uses BIPAP nightly; no longer using bipap, cleared from sleep apnea after bypass surgery    Past Surgical History:  Procedure Laterality Date  . ABDOMINAL HYSTERECTOMY    . CARPAL TUNNEL RELEASE Right   . DIAGNOSTIC LAPAROSCOPY    . FOOT SURGERY Right    bone removed from great toe   . GASTRIC ROUX-EN-Y N/A 05/16/2016   Procedure: LAPAROSCOPIC ROUX-EN-Y GASTRIC BYPASS WITH UPPER ENDOSCOPY;  Surgeon: Clovis Riley, MD;  [facility];  Service: General;  Laterality: N/A;  . LAPAROSCOPIC VAGINAL HYSTERECTOMY  03/14/2007  . LIPOMA EXCISION Left 08/26/2016   Procedure: EXCISION LIPOMA FROM LEFT UPPER POSTERIOR THIGH;  Surgeon: Clovis Riley, MD;  Location: [place];  Service: General;  Laterality: Left;  . MYRINGOTOMY WITH TUBE PLACEMENT Bilateral  03/02/2015   Procedure: MYRINGOTOMY WITH T-TUBE PLACEMENT;  Surgeon: Leta Baptist, MD;  Location: [place];  Service: ENT;  Laterality: Bilateral;  . NASAL SEPTOPLASTY W/ TURBINOPLASTY  02/22/2011   Procedure: NASAL SEPTOPLASTY WITH TURBINATE REDUCTION;  Surgeon: Ascencion Dike, MD;  Location: [place];  Service: ENT;  Laterality: Bilateral;  . TUBAL LIGATION  12/16/2004  . TYMPANOSTOMY TUBE PLACEMENT Bilateral 07/21/2017    There were no vitals filed for this visit.  Subjective Assessment - 08/24/17 1111    Subjective  Still having headaches, slept hard last night    Currently in Pain?  Yes    Pain Score  4     Pain Location  Head    Pain Orientation  Posterior    Pain Descriptors / Indicators  Nagging    Pain Type  Chronic pain;Acute pain    Pain Onset  More than a month ago    Pain Frequency  Constant                       [facility] Adult PT Treatment/Exercise - 08/24/17 0001      Neck Exercises: Machines for Strengthening   UBE (Upper Arm Bike)  L1x3' alt directions      Neck Exercises: Supine  Other Supine Exercise  on whole bolster, overhead stertch with yellow band, then horizontal abduction      Modalities   Modalities  Electrical Stimulation;Moist Heat      Moist Heat Therapy   Number Minutes Moist Heat  15 Minutes    Moist Heat Location  Cervical      Electrical Stimulation   Electrical Stimulation Location  cervical/thoracic    Electrical Stimulation Action  IFC    Electrical Stimulation Parameters  supine, to tolerance    Electrical Stimulation Goals  Tone;Pain      Manual Therapy   Manual Therapy  Joint mobilization;Soft tissue mobilization    Joint Mobilization  cervical and thoracic CPA  and bilat UPA mobs grade III    Soft tissue mobilization  STM to bilat cervical paraspinals in prone, TPR to perioccipital muscles.  and to bilateral TMJ      Neck Exercises: Stretches   Other Neck Stretches  on whole bolster, chest  opener stretches    Other Neck Stretches  thoracic openers over a med roll with arms in different positions followed by assist stretching.                PT Short Term Goals - 08/21/17 1648      PT SHORT TERM GOAL #1   Title  independent with initial HEP    Status  Achieved        PT Long Term Goals - 08/07/17 1748      PT LONG TERM GOAL #1   Title  understand proper posture and body mechanics    Time  8    Period  Weeks    Status  New      PT LONG TERM GOAL #2   Title  report HA frequency decreased 25%    Time  8    Period  Weeks    Status  New      PT LONG TERM GOAL #3   Title  report HA intensity decreased 25%    Time  8    Period  Weeks    Status  New      PT LONG TERM GOAL #4   Title  independent with advanced HEP    Time  8    Period  Weeks    Status  New            Plan - 08/24/17 1157    Clinical Impression Statement  Chapel had increased moblity in her cervical spine, was tight and tender at T4-9, this improved after mobilizations. She would benefit from more cervical stabilization and improved thoracic mobilty.     Rehab Potential  Good    PT Frequency  2x / week    PT Duration  8 weeks    PT Treatment/Interventions  ADLs/Self Care Home Management;Cryotherapy;Electrical Stimulation;Moist Heat;Traction;Ultrasound;Patient/family education;Manual techniques;Dry needling    PT Next Visit Plan  cont with manual work to neck, chest and thoracic openers along with postural strengthening    Consulted and Agree with Plan of Care  Patient       Patient will benefit from skilled therapeutic intervention in order to improve the following deficits and impairments:  Decreased range of motion, Increased fascial restricitons, Increased muscle spasms, Pain, Improper body mechanics, Postural dysfunction  Visit Diagnosis: Chronic tension-type headache, intractable  Cervicalgia     Problem List Patient Active Problem List   Diagnosis Date Noted  .  Chronic migraine without aura, with intractable migraine, so stated,  with status migrainosus 06/18/2017  . GAD (generalized anxiety disorder) 04/16/2013  . Bipolar 1 disorder, depressed ([place]) 04/16/2013    Jeral Pinch PT  08/24/2017, 11:59 AM  [place], [place], [address] Phone: [phone]   Fax:  [phone]  Name: Margaret Lopez MRN: [id number] Date of Birth: 09-Mar-1970

## 2017-08-24 NOTE — Telephone Encounter (Signed)
Reviewed Cambia 50 mg order verbally with Dr. Jaynee Eagles and prescribed. Printed and ready for signature.

## 2017-08-25 ENCOUNTER — Telehealth: Payer: Self-pay | Admitting: *Deleted

## 2017-08-25 NOTE — Telephone Encounter (Signed)
Cambia 50 mg prescription faxed to [facility] for patient to be able to participate in program. Received a receipt of confirmation.

## 2017-08-29 ENCOUNTER — Ambulatory Visit: Payer: BLUE CROSS/BLUE SHIELD | Admitting: Physical Therapy

## 2017-08-31 ENCOUNTER — Ambulatory Visit: Payer: BLUE CROSS/BLUE SHIELD | Admitting: Physical Therapy

## 2017-08-31 DIAGNOSIS — G44221 Chronic tension-type headache, intractable: Secondary | ICD-10-CM

## 2017-08-31 DIAGNOSIS — M542 Cervicalgia: Secondary | ICD-10-CM | POA: Diagnosis not present

## 2017-08-31 DIAGNOSIS — J3089 Other allergic rhinitis: Secondary | ICD-10-CM | POA: Diagnosis not present

## 2017-08-31 DIAGNOSIS — J301 Allergic rhinitis due to pollen: Secondary | ICD-10-CM | POA: Diagnosis not present

## 2017-08-31 DIAGNOSIS — J3081 Allergic rhinitis due to animal (cat) (dog) hair and dander: Secondary | ICD-10-CM | POA: Diagnosis not present

## 2017-08-31 NOTE — Therapy (Signed)
[place] [address] [place], [place], [address] Phone: [phone]   Fax:  [phone]  Physical Therapy Treatment  Patient Details  Name: SELEN SMUCKER MRN: [id number] Date of Birth: 05/22/1970 Referring Provider: Jaynee Eagles   Encounter Date: 08/31/2017  PT End of Session - 08/31/17 1643    Visit Number  6    Date for PT Re-Evaluation  10/08/17    PT Start Time  1550    PT Stop Time  1652    PT Time Calculation (min)  62 min    Activity Tolerance  Patient tolerated treatment well    Behavior During Therapy  [facility] for tasks assessed/performed       Past Medical History:  Diagnosis Date  . Anxiety   . Bipolar disorder ([place])   . Depression   . Deviated nasal septum 02/2011  . Generalized anxiety disorder 04/16/2013  . GERD (gastroesophageal reflux disease)   . Headache(784.0)    migraines  . Insomnia   . Nasal turbinate hypertrophy 02/2011   bilat.  . Neuromuscular disorder [facility])    Dr. Jerilynn Mages. Domingo Cocking- does injections around nerve in her head for prevention of migraines - q 3 months   . OSA (obstructive sleep apnea)   . Pneumonia    hx of   . PONV (postoperative nausea and vomiting)   . Seasonal allergies   . Sleep apnea    uses BIPAP nightly; no longer using bipap, cleared from sleep apnea after bypass surgery    Past Surgical History:  Procedure Laterality Date  . ABDOMINAL HYSTERECTOMY    . CARPAL TUNNEL RELEASE Right   . DIAGNOSTIC LAPAROSCOPY    . FOOT SURGERY Right    bone removed from great toe   . GASTRIC ROUX-EN-Y N/A 05/16/2016   Procedure: LAPAROSCOPIC ROUX-EN-Y GASTRIC BYPASS WITH UPPER ENDOSCOPY;  Surgeon: Clovis Riley, MD;  [facility];  Service: General;  Laterality: N/A;  . LAPAROSCOPIC VAGINAL HYSTERECTOMY  03/14/2007  . LIPOMA EXCISION Left 08/26/2016   Procedure: EXCISION LIPOMA FROM LEFT UPPER POSTERIOR THIGH;  Surgeon: Clovis Riley, MD;  Location: [place];  Service: General;   Laterality: Left;  . MYRINGOTOMY WITH TUBE PLACEMENT Bilateral 03/02/2015   Procedure: MYRINGOTOMY WITH T-TUBE PLACEMENT;  Surgeon: Leta Baptist, MD;  Location: [place];  Service: ENT;  Laterality: Bilateral;  . NASAL SEPTOPLASTY W/ TURBINOPLASTY  02/22/2011   Procedure: NASAL SEPTOPLASTY WITH TURBINATE REDUCTION;  Surgeon: Ascencion Dike, MD;  Location: [place];  Service: ENT;  Laterality: Bilateral;  . TUBAL LIGATION  12/16/2004  . TYMPANOSTOMY TUBE PLACEMENT Bilateral 07/21/2017    There were no vitals filed for this visit.  Subjective Assessment - 08/31/17 1548    Subjective  "the headaches are better" Pt reports that she twisted hurting her lower back Monday    Currently in Pain?  Yes    Pain Score  4     Pain Location  Back    Pain Orientation  Left                       [facility] Adult PT Treatment/Exercise - 08/31/17 0001      Exercises   Exercises  Neck      Neck Exercises: Machines for Strengthening   UBE (Upper Arm Bike)  L2.5 x3' alt directions    Nustep  L4 x4 min       Neck Exercises:  Standing   Other Standing Exercises  shoulder ext, er, red and flex 2lb 2x10     Other Standing Exercises  shrugs 5lb 2x10        Neck Exercises: Seated   Other Seated Exercise  Rows green tband 2x10       Neck Exercises: Supine   Other Supine Exercise  LE passive stretching, HS, Single and double K2C      Modalities   Modalities  Electrical Stimulation;Moist Heat      Moist Heat Therapy   Number Minutes Moist Heat  15 Minutes    Moist Heat Location  Cervical;Lumbar Spine      Electrical Stimulation   Electrical Stimulation Location  cervical/lumbar    Electrical Stimulation Action  pre mod    Electrical Stimulation Parameters  supine to pt tolerance    Electrical Stimulation Goals  Pain      Manual Therapy   Manual Therapy  Joint mobilization;Soft tissue mobilization    Soft tissue mobilization  posterior cervical para spinales     Passive ROM  cervical spine full AROM    Manual Traction  cervical spine 5x10'' some with rotation               PT Short Term Goals - 08/21/17 1648      PT SHORT TERM GOAL #1   Title  independent with initial HEP    Status  Achieved        PT Long Term Goals - 08/31/17 1644      PT LONG TERM GOAL #1   Title  understand proper posture and body mechanics    Status  Achieved      PT LONG TERM GOAL #2   Title  report HA frequency decreased 25%    Status  Partially Met      PT LONG TERM GOAL #4   Title  independent with advanced HEP    Status  On-going            Plan - 08/31/17 1644    Clinical Impression Statement  Pt enters clinic reporting increase LBP and tightness, but stated that her headaches have improved. She did well with today's exercises without increase pain. Good strength and ROM with all exercises. Positive response to MT. progressing towards all goals.    Rehab Potential  Good    PT Frequency  2x / week    PT Duration  8 weeks    PT Treatment/Interventions  ADLs/Self Care Home Management;Cryotherapy;Electrical Stimulation;Moist Heat;Traction;Ultrasound;Patient/family education;Manual techniques;Dry needling    PT Next Visit Plan  cont with manual work to neck, chest and thoracic openers along with postural strengthening       Patient will benefit from skilled therapeutic intervention in order to improve the following deficits and impairments:  Decreased range of motion, Increased fascial restricitons, Increased muscle spasms, Pain, Improper body mechanics, Postural dysfunction  Visit Diagnosis: Cervicalgia  Chronic tension-type headache, intractable     Problem List Patient Active Problem List   Diagnosis Date Noted  . Chronic migraine without aura, with intractable migraine, so stated, with status migrainosus 06/18/2017  . GAD (generalized anxiety disorder) 04/16/2013  . Bipolar 1 disorder, depressed ([place]) 04/16/2013    Scot Jun, PTA 08/31/2017, 4:50 PM  [place], [place], [address] Phone: [phone]   Fax:  [phone]  Name: DENIQUA PERRY MRN: [id number] Date of Birth: 1970-07-23

## 2017-09-04 ENCOUNTER — Ambulatory Visit: Payer: BLUE CROSS/BLUE SHIELD | Admitting: Physical Therapy

## 2017-09-04 ENCOUNTER — Encounter: Payer: Self-pay | Admitting: Physical Therapy

## 2017-09-04 DIAGNOSIS — M542 Cervicalgia: Secondary | ICD-10-CM

## 2017-09-04 DIAGNOSIS — G44221 Chronic tension-type headache, intractable: Secondary | ICD-10-CM | POA: Diagnosis not present

## 2017-09-04 NOTE — Therapy (Signed)
[facility] [address] , [place], [address] Phone: [phone]   Fax:  [phone]  Physical Therapy Treatment  Patient Details  Name: Margaret Lopez MRN: [id number] Date of Birth: 10/25/1970 Referring Provider: Ahern   Encounter Date: 09/04/2017  PT End of Session - 09/04/17 1656    Visit Number  7    Date for PT Re-Evaluation  10/08/17    PT Start Time  1620    PT Stop Time  1700    PT Time Calculation (min)  40 min    Activity Tolerance  Patient tolerated treatment well    Behavior During Therapy  WFL for tasks assessed/performed       Past Medical History:  Diagnosis Date  . Anxiety   . Bipolar disorder (HCC)   . Depression   . Deviated nasal septum 02/2011  . Generalized anxiety disorder 04/16/2013  . GERD (gastroesophageal reflux disease)   . Headache(784.0)    migraines  . Insomnia   . Nasal turbinate hypertrophy 02/2011   bilat.  . Neuromuscular disorder (HCC)    Dr. M. Freeman- does injections around nerve in her head for prevention of migraines - q 3 months   . OSA (obstructive sleep apnea)   . Pneumonia    hx of   . PONV (postoperative nausea and vomiting)   . Seasonal allergies   . Sleep apnea    uses BIPAP nightly; no longer using bipap, cleared from sleep apnea after bypass surgery    Past Surgical History:  Procedure Laterality Date  . ABDOMINAL HYSTERECTOMY    . CARPAL TUNNEL RELEASE Right   . DIAGNOSTIC LAPAROSCOPY    . FOOT SURGERY Right    bone removed from great toe   . GASTRIC ROUX-EN-Y N/A 05/16/2016   Procedure: LAPAROSCOPIC ROUX-EN-Y GASTRIC BYPASS WITH UPPER ENDOSCOPY;  Surgeon: Connor, Chelsea A, MD;  Location: [facility];  Service: General;  Laterality: N/A;  . LAPAROSCOPIC VAGINAL HYSTERECTOMY  03/14/2007  . LIPOMA EXCISION Left 08/26/2016   Procedure: EXCISION LIPOMA FROM LEFT UPPER POSTERIOR THIGH;  Surgeon: Connor, Chelsea A, MD;  Location[facility];  Service: General;   Laterality: Left;  . MYRINGOTOMY WITH TUBE PLACEMENT Bilateral 03/02/2015   Procedure: MYRINGOTOMY WITH T-TUBE PLACEMENT;  Surgeon: Su Teoh, MD;  Location: [facility];  Service: ENT;  Laterality: Bilateral;  . NASAL SEPTOPLASTY W/ TURBINOPLASTY  02/22/2011   Procedure: NASAL SEPTOPLASTY WITH TURBINATE REDUCTION;  Surgeon: Sui W Teoh, MD;  Location: [facility];  Service: ENT;  Laterality: Bilateral;  . TUBAL LIGATION  12/16/2004  . TYMPANOSTOMY TUBE PLACEMENT Bilateral 07/21/2017    There were no vitals filed for this visit.  Subjective Assessment - 09/04/17 1621    Subjective  Patient reports that her back is feeling better, reports that she had some HA's over the weekend.    Currently in Pain?  Yes    Pain Score  2     Pain Location  Head    Aggravating Factors   "weather?"                       [facility] Adult PT Treatment/Exercise - 09/04/17 0001      Neck Exercises: Machines for Strengthening   Nustep  L4 x5 min       Neck Exercises: Standing   Other Standing Exercises  shoulder ext, er, red and flex 2lb 2x10       Other Standing Exercises  shrugs 5lb 2x10   with upper trap and levator stretches, weighted ball overhead reach      Neck Exercises: Seated   Other Seated Exercise  Rows green tband 2x10       Neck Exercises: Supine   Other Supine Exercise  on whole bolster, overhead stertch with yellow band, then horizontal abduction    Other Supine Exercise  LE passive stretching, HS, Single and double K2C      Manual Therapy   Manual Therapy  Soft tissue mobilization;Passive ROM;Manual Traction;Neural Stretch    Soft tissue mobilization  posterior cervical para spinales    Passive ROM  cervical spine full AROM    Manual Traction  cervical spine 5x10'' some with rotation               PT Short Term Goals - 08/21/17 1648      PT SHORT TERM GOAL #1   Title  independent with initial HEP    Status  Achieved        PT Long  Term Goals - 08/31/17 1644      PT LONG TERM GOAL #1   Title  understand proper posture and body mechanics    Status  Achieved      PT LONG TERM GOAL #2   Title  report HA frequency decreased 25%    Status  Partially Met      PT LONG TERM GOAL #4   Title  independent with advanced HEP    Status  On-going            Plan - 09/04/17 1657    Clinical Impression Statement  Patient had some increased HA's over the weekend, feels like the exercises are helping, has questions about gym exercises, she is able to relax more and reports that she is watching her posture at work    PT Next Visit Plan  start more strengthening in the gym    Consulted and Agree with Plan of Care  Patient       Patient will benefit from skilled therapeutic intervention in order to improve the following deficits and impairments:  Decreased range of motion, Increased fascial restricitons, Increased muscle spasms, Pain, Improper body mechanics, Postural dysfunction  Visit Diagnosis: Cervicalgia  Chronic tension-type headache, intractable     Problem List Patient Active Problem List   Diagnosis Date Noted  . Chronic migraine without aura, with intractable migraine, so stated, with status migrainosus 06/18/2017  . GAD (generalized anxiety disorder) 04/16/2013  . Bipolar 1 disorder, depressed (HCC) 04/16/2013    , W., PT 09/04/2017, 5:05 PM  [facility] [address] [place], [place], [address] Phone: [phone]   Fax:  [phone]  Name: Margaret Lopez MRN: [id number] Date of Birth: 09/29/1970   

## 2017-09-06 DIAGNOSIS — J301 Allergic rhinitis due to pollen: Secondary | ICD-10-CM | POA: Diagnosis not present

## 2017-09-06 DIAGNOSIS — J3081 Allergic rhinitis due to animal (cat) (dog) hair and dander: Secondary | ICD-10-CM | POA: Diagnosis not present

## 2017-09-06 DIAGNOSIS — J3089 Other allergic rhinitis: Secondary | ICD-10-CM | POA: Diagnosis not present

## 2017-09-07 ENCOUNTER — Encounter: Payer: BLUE CROSS/BLUE SHIELD | Attending: Surgery | Admitting: Skilled Nursing Facility1

## 2017-09-07 ENCOUNTER — Ambulatory Visit: Payer: BLUE CROSS/BLUE SHIELD | Admitting: Physical Therapy

## 2017-09-07 ENCOUNTER — Encounter: Payer: Self-pay | Admitting: Physical Therapy

## 2017-09-07 ENCOUNTER — Encounter: Payer: Self-pay | Admitting: Skilled Nursing Facility1

## 2017-09-07 DIAGNOSIS — Z9884 Bariatric surgery status: Secondary | ICD-10-CM | POA: Diagnosis not present

## 2017-09-07 DIAGNOSIS — Z713 Dietary counseling and surveillance: Secondary | ICD-10-CM | POA: Diagnosis not present

## 2017-09-07 DIAGNOSIS — M542 Cervicalgia: Secondary | ICD-10-CM | POA: Diagnosis not present

## 2017-09-07 DIAGNOSIS — E669 Obesity, unspecified: Secondary | ICD-10-CM

## 2017-09-07 DIAGNOSIS — G44221 Chronic tension-type headache, intractable: Secondary | ICD-10-CM | POA: Diagnosis not present

## 2017-09-07 NOTE — Patient Instructions (Addendum)
-  Try to have fruit every day such as grapes  -Take you probiotic every day for 6 weeks  -Keep working on getting your goal of 64 fluid ounces  -Keep ur awesome physical activity!!

## 2017-09-07 NOTE — Progress Notes (Signed)
Post-Operative RYGB Surgery  Medical Nutrition Therapy:  Appt start time: 3:20 end time:  4:11.  Primary concerns today: Post-operative Bariatric Surgery Nutrition Management.  Non scale victories: more active  Pt states fat and sugar make her really naueasus.   Pt states she has been tired thinking it is from a schedule change. Pt states he has otherwise been feeling pretty good. Pt states she has been waking up during the night. Pt states he carries a crustable peanut butter sandwich with her for times when she did not have food. Pt state she knows she struggles to get in enough of her fluid. Pt states she feels she alternated between constiaption and diarrhea. Pt states her headache doctor referred her to PT for strength training. Pt states her nausea has gotten a Lot better and states if she eats and drinks regularly she will not have issues.  Pt states if anything comes up physically she will call sooner than her 6 month appointment.   Surgery date: 05/16/2016 Surgery type: RYGB Start weight at [facility]: 227.3 lbs 02/05/2016 Weight today: 132 Weight loss goal: less than 150 lbs, learn to eat healthier foods, be more active/present with family  TANITA  BODY COMP RESULTS  01/19/2016 03/01/2017 06/01/2017 09/07/2017   BMI (kg/m^2) 26.9 26 24.7 23.4   Fat Mass (lbs) 51.4 48.2 35.4 32.8   Fat Free Mass (lbs) 100.4 98.6 103.8 99.2   Total Body Water (lbs) 70.6 68.8 72.4 68.8    24-hr recall: eating every 2-3 hours; 1 day a week not ; 1 protein shake most days of the week B (AM): regular coffee chic fila biscuit with no bread with banana or bacon and egg on cressoint (frozen: sometimes not eating the bread) sometimes with a small banana  Snk (AM):  Decaf coffee or propel L (PM): chicken pot pie or beef stew  Snk (PM): decaf coffee or propel zero 4pm (early dinner): pimento cheese and bagel crisps and cucmbers or hamburgers and mac n cheese and salad  D (PM): grilled chicken 1.5 ounces with  salad or popcorn  Snk (PM): chocolate protein shake (for dessert)  Fluid intake: 2 decaff coffee, 1 bottle of propel, sugar free koolaid 2 regular coffee: 40-50 ounces of coffee  Estimated total protein intake: 60g  Medications: See list Supplementation: opurity once a day and tums 3 times a day  Using straws: no Drinking while eating: no Having you been chewing well: yes Chewing/swallowing difficulties: once Changes in vision: no Changes to mood/headaches: no Hair loss/Changes to skin/Changes to nails: no Any difficulty focusing or concentrating: no Sweating: no Dizziness/Lightheaded: no Palpitations: no  Carbonated beverages: no N/V/D/C/GAS: some constipation  Abdominal Pain: no Dumping syndrome: no  Recent physical activity:  Walking 20-30 min, 6x/week; strength training with PT  Progress Towards Goal(s):  In progress.   Nutritional Diagnosis:  Inadequate fluid intake As related to bariatric surgery post-op recommendations.  As evidenced by dietary recall of less than 64 oz of fluid daily. .    Intervention:  Nutrition education and counseling. Goals: -Try to have fruit every day such as grapes -Take you probiotic every day for 6 weeks -Keep working on getting your goal of 64 fluid ounces  -Keep ur awesome physical activity!!  Teaching Method Utilized:  Visual Auditory Hands on  Barriers to learning/adherence to lifestyle change: none  Demonstrated degree of understanding via:  Teach Back   Monitoring/Evaluation:  Dietary intake, exercise, and body weight.

## 2017-09-07 NOTE — Therapy (Signed)
[place] [place], [place], [address] Phone: [phone]   Fax:  [phone]  Physical Therapy Treatment  Patient Details  Name: Margaret Lopez MRN: [id number] Date of Birth: 1970-12-29 Referring Provider: Jaynee Eagles   Encounter Date: 09/07/2017  PT End of Session - 09/07/17 1106    Visit Number  8    Date for PT Re-Evaluation  10/08/17    PT Start Time  1029    PT Stop Time  1119    PT Time Calculation (min)  50 min    Activity Tolerance  Patient tolerated treatment well    Behavior During Therapy  [facility] for tasks assessed/performed       Past Medical History:  Diagnosis Date  . Anxiety   . Bipolar disorder ([place])   . Depression   . Deviated nasal septum 02/2011  . Generalized anxiety disorder 04/16/2013  . GERD (gastroesophageal reflux disease)   . Headache(784.0)    migraines  . Insomnia   . Nasal turbinate hypertrophy 02/2011   bilat.  . Neuromuscular disorder [facility])    Dr. Jerilynn Mages. Domingo Cocking- does injections around nerve in her head for prevention of migraines - q 3 months   . OSA (obstructive sleep apnea)   . Pneumonia    hx of   . PONV (postoperative nausea and vomiting)   . Seasonal allergies   . Sleep apnea    uses BIPAP nightly; no longer using bipap, cleared from sleep apnea after bypass surgery    Past Surgical History:  Procedure Laterality Date  . ABDOMINAL HYSTERECTOMY    . CARPAL TUNNEL RELEASE Right   . DIAGNOSTIC LAPAROSCOPY    . FOOT SURGERY Right    bone removed from great toe   . GASTRIC ROUX-EN-Y N/A 05/16/2016   Procedure: LAPAROSCOPIC ROUX-EN-Y GASTRIC BYPASS WITH UPPER ENDOSCOPY;  Surgeon: Clovis Riley, MD;  [facility];  Service: General;  Laterality: N/A;  . LAPAROSCOPIC VAGINAL HYSTERECTOMY  03/14/2007  . LIPOMA EXCISION Left 08/26/2016   Procedure: EXCISION LIPOMA FROM LEFT UPPER POSTERIOR THIGH;  Surgeon: Clovis Riley, MD;  Location: [place];  Service: General;   Laterality: Left;  . MYRINGOTOMY WITH TUBE PLACEMENT Bilateral 03/02/2015   Procedure: MYRINGOTOMY WITH T-TUBE PLACEMENT;  Surgeon: Leta Baptist, MD;  Location: [place];  Service: ENT;  Laterality: Bilateral;  . NASAL SEPTOPLASTY W/ TURBINOPLASTY  02/22/2011   Procedure: NASAL SEPTOPLASTY WITH TURBINATE REDUCTION;  Surgeon: Ascencion Dike, MD;  Location: [place];  Service: ENT;  Laterality: Bilateral;  . TUBAL LIGATION  12/16/2004  . TYMPANOSTOMY TUBE PLACEMENT Bilateral 07/21/2017    There were no vitals filed for this visit.  Subjective Assessment - 09/07/17 1030    Subjective  "Good" "I had a headache yesterdays, It was probably like a 6 I had to take something for it"    Currently in Pain?  No/denies    Pain Score  0-No pain                       [facility] Adult PT Treatment/Exercise - 09/07/17 0001      Exercises   Exercises  Neck      Neck Exercises: Machines for Strengthening   UBE (Upper Arm Bike)  L4 66mn eax[address] way     Other Machines for Strengthening  Rows and lats 20lb 2x10      Neck Exercises: Standing   Other  Standing Exercises  shoulder ext green, er rex 2x10       Moist Heat Therapy   Number Minutes Moist Heat  15 Minutes    Moist Heat Location  Cervical;Lumbar Spine      Electrical Stimulation   Electrical Stimulation Location  cervical/lumbar    Electrical Stimulation Action  pre mod    Electrical Stimulation Parameters  supine to pt tolerance    Electrical Stimulation Goals  Pain      Manual Therapy   Manual Therapy  Soft tissue mobilization;Passive ROM;Manual Traction;Neural Stretch    Soft tissue mobilization  posterior cervical para spinales    Passive ROM  cervical spine full AROM    Manual Traction  cervical spine 5x10'' some with rotation               PT Short Term Goals - 08/21/17 1648      PT SHORT TERM GOAL #1   Title  independent with initial HEP    Status  Achieved        PT Long Term  Goals - 09/07/17 1109      PT LONG TERM GOAL #2   Title  report HA frequency decreased 25%    Status  Partially Met      PT LONG TERM GOAL #3   Title  report HA intensity decreased 25%    Status  Partially Met            Plan - 09/07/17 1107    Clinical Impression Statement  Pt ~ 14 minutes late for today's treatment session. She did well with all of today's exercises increasing her strength with standing shoulder extension. Also added some machine level exercises without issue. She did require cues to keep shoulders down with shoulder extensions. positive response to MT. Reports little tightness in upper R trap.     Rehab Potential  Good    PT Duration  8 weeks    PT Treatment/Interventions  ADLs/Self Care Home Management;Cryotherapy;Electrical Stimulation;Moist Heat;Traction;Ultrasound;Patient/family education;Manual techniques;Dry needling    PT Next Visit Plan  start more strengthening in the gym       Patient will benefit from skilled therapeutic intervention in order to improve the following deficits and impairments:  Decreased range of motion, Increased fascial restricitons, Increased muscle spasms, Pain, Improper body mechanics, Postural dysfunction  Visit Diagnosis: Chronic tension-type headache, intractable  Cervicalgia     Problem List Patient Active Problem List   Diagnosis Date Noted  . Chronic migraine without aura, with intractable migraine, so stated, with status migrainosus 06/18/2017  . GAD (generalized anxiety disorder) 04/16/2013  . Bipolar 1 disorder, depressed ([place]) 04/16/2013    Scot Jun, PTA 09/07/2017, 11:10 AM  [place], [place], [address] Phone: [phone]   Fax:  [phone]  Name: Margaret Lopez MRN: [id number] Date of Birth: [birth year]

## 2017-09-12 DIAGNOSIS — J301 Allergic rhinitis due to pollen: Secondary | ICD-10-CM | POA: Diagnosis not present

## 2017-09-12 DIAGNOSIS — J3089 Other allergic rhinitis: Secondary | ICD-10-CM | POA: Diagnosis not present

## 2017-09-12 DIAGNOSIS — J3081 Allergic rhinitis due to animal (cat) (dog) hair and dander: Secondary | ICD-10-CM | POA: Diagnosis not present

## 2017-09-14 ENCOUNTER — Ambulatory Visit: Payer: BLUE CROSS/BLUE SHIELD | Admitting: Physical Therapy

## 2017-09-14 DIAGNOSIS — R05 Cough: Secondary | ICD-10-CM | POA: Diagnosis not present

## 2017-09-14 DIAGNOSIS — J019 Acute sinusitis, unspecified: Secondary | ICD-10-CM | POA: Diagnosis not present

## 2017-09-14 DIAGNOSIS — J301 Allergic rhinitis due to pollen: Secondary | ICD-10-CM | POA: Diagnosis not present

## 2017-09-14 DIAGNOSIS — J209 Acute bronchitis, unspecified: Secondary | ICD-10-CM | POA: Diagnosis not present

## 2017-09-18 ENCOUNTER — Ambulatory Visit: Payer: BLUE CROSS/BLUE SHIELD | Admitting: Physical Therapy

## 2017-09-18 ENCOUNTER — Other Ambulatory Visit (HOSPITAL_COMMUNITY): Payer: Self-pay | Admitting: Psychiatry

## 2017-09-18 DIAGNOSIS — F319 Bipolar disorder, unspecified: Secondary | ICD-10-CM

## 2017-09-20 ENCOUNTER — Ambulatory Visit: Payer: BLUE CROSS/BLUE SHIELD | Attending: Neurology | Admitting: Physical Therapy

## 2017-09-20 ENCOUNTER — Encounter: Payer: Self-pay | Admitting: Physical Therapy

## 2017-09-20 DIAGNOSIS — G44221 Chronic tension-type headache, intractable: Secondary | ICD-10-CM

## 2017-09-20 DIAGNOSIS — M542 Cervicalgia: Secondary | ICD-10-CM | POA: Diagnosis not present

## 2017-09-20 NOTE — Therapy (Signed)
[place], [place], [address] Phone: [phone]   Fax:  [phone]  Physical Therapy Treatment  Patient Details  Name: Margaret Lopez MRN: [id number] Date of Birth: March 15, 1970 Referring Provider: Jaynee Eagles   Encounter Date: 09/20/2017  PT End of Session - 09/20/17 1655    Visit Number  9    Date for PT Re-Evaluation  10/08/17    PT Start Time  1615    PT Stop Time  1655    PT Time Calculation (min)  40 min    Activity Tolerance  Patient tolerated treatment well    Behavior During Therapy  [facility] for tasks assessed/performed       Past Medical History:  Diagnosis Date  . Anxiety   . Bipolar disorder ([place])   . Depression   . Deviated nasal septum 02/2011  . Generalized anxiety disorder 04/16/2013  . GERD (gastroesophageal reflux disease)   . Headache(784.0)    migraines  . Insomnia   . Nasal turbinate hypertrophy 02/2011   bilat.  . Neuromuscular disorder [facility])    Dr. Jerilynn Mages. Domingo Cocking- does injections around nerve in her head for prevention of migraines - q 3 months   . OSA (obstructive sleep apnea)   . Pneumonia    hx of   . PONV (postoperative nausea and vomiting)   . Seasonal allergies   . Sleep apnea    uses BIPAP nightly; no longer using bipap, cleared from sleep apnea after bypass surgery    Past Surgical History:  Procedure Laterality Date  . ABDOMINAL HYSTERECTOMY    . CARPAL TUNNEL RELEASE Right   . DIAGNOSTIC LAPAROSCOPY    . FOOT SURGERY Right    bone removed from great toe   . GASTRIC ROUX-EN-Y N/A 05/16/2016   Procedure: LAPAROSCOPIC ROUX-EN-Y GASTRIC BYPASS WITH UPPER ENDOSCOPY;  Surgeon: Clovis Riley, MD;  [facility];  Service: General;  Laterality: N/A;  . LAPAROSCOPIC VAGINAL HYSTERECTOMY  03/14/2007  . LIPOMA EXCISION Left 08/26/2016   Procedure: EXCISION LIPOMA FROM LEFT UPPER POSTERIOR THIGH;  Surgeon: Clovis Riley, MD;  Location: [place];  Service: General;   Laterality: Left;  . MYRINGOTOMY WITH TUBE PLACEMENT Bilateral 03/02/2015   Procedure: MYRINGOTOMY WITH T-TUBE PLACEMENT;  Surgeon: Leta Baptist, MD;  Location: [place];  Service: ENT;  Laterality: Bilateral;  . NASAL SEPTOPLASTY W/ TURBINOPLASTY  02/22/2011   Procedure: NASAL SEPTOPLASTY WITH TURBINATE REDUCTION;  Surgeon: Ascencion Dike, MD;  Location: [place];  Service: ENT;  Laterality: Bilateral;  . TUBAL LIGATION  12/16/2004  . TYMPANOSTOMY TUBE PLACEMENT Bilateral 07/21/2017    There were no vitals filed for this visit.  Subjective Assessment - 09/20/17 1627    Subjective  Reports that she has had less HA's, feels like she wants to try more gym stuff to become stronger    Currently in Pain?  No/denies                       [facility] Adult PT Treatment/Exercise - 09/20/17 0001      Neck Exercises: Machines for Strengthening   Nustep  L5 x5 min     Other Machines for Strengthening  leg ext 10#, leg curls 25#, leg press 30#, triceps 25#, biceps 10# all 2x10, hip extension and abduction 5#      Neck Exercises: Supine   Other Supine Exercise  feet on ball bridges and  then abdominal isometrics               PT Short Term Goals - 08/21/17 1648      PT SHORT TERM GOAL #1   Title  independent with initial HEP    Status  Achieved        PT Long Term Goals - 09/07/17 1109      PT LONG TERM GOAL #2   Title  report HA frequency decreased 25%    Status  Partially Met      PT LONG TERM GOAL #3   Title  report HA intensity decreased 25%    Status  Partially Met            Plan - 09/20/17 1655    Clinical Impression Statement  Patient very fatigued with activity, I asked her to stay above 80 steps /minute on the Nustep she did it but almost stopped.  She did well with other exercises at times needing some cues to relax her jaw and shoulders    PT Next Visit Plan  start more strengthening in the gym try arms next    Consulted  and Agree with Plan of Care  Patient       Patient will benefit from skilled therapeutic intervention in order to improve the following deficits and impairments:  Decreased range of motion, Increased fascial restricitons, Increased muscle spasms, Pain, Improper body mechanics, Postural dysfunction  Visit Diagnosis: Chronic tension-type headache, intractable  Cervicalgia     Problem List Patient Active Problem List   Diagnosis Date Noted  . Chronic migraine without aura, with intractable migraine, so stated, with status migrainosus 06/18/2017  . GAD (generalized anxiety disorder) 04/16/2013  . Bipolar 1 disorder, depressed ([place]) 04/16/2013    Sumner Boast., PT 09/20/2017, 4:57 PM  [place] [address] [place], [place], [address] Phone: [phone]   Fax:  [phone]  Name: Margaret Lopez MRN: [id number] Date of Birth: [birth year]

## 2017-09-21 ENCOUNTER — Ambulatory Visit: Payer: BLUE CROSS/BLUE SHIELD | Admitting: Physical Therapy

## 2017-09-21 ENCOUNTER — Encounter: Payer: Self-pay | Admitting: Physical Therapy

## 2017-09-21 DIAGNOSIS — M542 Cervicalgia: Secondary | ICD-10-CM

## 2017-09-21 DIAGNOSIS — G44221 Chronic tension-type headache, intractable: Secondary | ICD-10-CM | POA: Diagnosis not present

## 2017-09-21 NOTE — Therapy (Signed)
[place] [address] [place], [place], [address] Phone: [phone]   Fax:  [phone] Progress Note Reporting Period 08/07/17 to 09/21/17 for the first 10 visits  See note below for Objective Data and Assessment of Progress/Goals.      Physical Therapy Treatment  Patient Details  Name: Margaret Lopez MRN: [id number] Date of Birth: [birth year] Referring Provider: Jaynee Eagles   Encounter Date: 09/21/2017  PT End of Session - 09/21/17 1053    Visit Number  10    Date for PT Re-Evaluation  10/08/17    PT Start Time  1014    PT Stop Time  1054    PT Time Calculation (min)  40 min    Activity Tolerance  Patient tolerated treatment well    Behavior During Therapy  [facility] for tasks assessed/performed       Past Medical History:  Diagnosis Date  . Anxiety   . Bipolar disorder ([place])   . Depression   . Deviated nasal septum 02/2011  . Generalized anxiety disorder 04/16/2013  . GERD (gastroesophageal reflux disease)   . Headache(784.0)    migraines  . Insomnia   . Nasal turbinate hypertrophy 02/2011   bilat.  . Neuromuscular disorder [facility])    Dr. Jerilynn Mages. Domingo Cocking- does injections around nerve in her head for prevention of migraines - q 3 months   . OSA (obstructive sleep apnea)   . Pneumonia    hx of   . PONV (postoperative nausea and vomiting)   . Seasonal allergies   . Sleep apnea    uses BIPAP nightly; no longer using bipap, cleared from sleep apnea after bypass surgery    Past Surgical History:  Procedure Laterality Date  . ABDOMINAL HYSTERECTOMY    . CARPAL TUNNEL RELEASE Right   . DIAGNOSTIC LAPAROSCOPY    . FOOT SURGERY Right    bone removed from great toe   . GASTRIC ROUX-EN-Y N/A 05/16/2016   Procedure: LAPAROSCOPIC ROUX-EN-Y GASTRIC BYPASS WITH UPPER ENDOSCOPY;  Surgeon: Clovis Riley, MD;  [facility];  Service: General;  Laterality: N/A;  . LAPAROSCOPIC VAGINAL HYSTERECTOMY  03/14/2007  . LIPOMA  EXCISION Left 08/26/2016   Procedure: EXCISION LIPOMA FROM LEFT UPPER POSTERIOR THIGH;  Surgeon: Clovis Riley, MD;  Location: [place];  Service: General;  Laterality: Left;  . MYRINGOTOMY WITH TUBE PLACEMENT Bilateral 03/02/2015   Procedure: MYRINGOTOMY WITH T-TUBE PLACEMENT;  Surgeon: Leta Baptist, MD;  Location: [place];  Service: ENT;  Laterality: Bilateral;  . NASAL SEPTOPLASTY W/ TURBINOPLASTY  02/22/2011   Procedure: NASAL SEPTOPLASTY WITH TURBINATE REDUCTION;  Surgeon: Ascencion Dike, MD;  Location: [place];  Service: ENT;  Laterality: Bilateral;  . TUBAL LIGATION  12/16/2004  . TYMPANOSTOMY TUBE PLACEMENT Bilateral 07/21/2017    There were no vitals filed for this visit.  Subjective Assessment - 09/21/17 1024    Subjective  Patient reports that she was very tired after the last treatment, but she is not sore, "slept great"    Currently in Pain?  No/denies                       [facility] Adult PT Treatment/Exercise - 09/21/17 0001      Self-Care   Self-Care  Other Self-Care Comments    Other Self-Care Comments   started education on tband at home for arms and legs      Neck Exercises: Machines  for Strengthening   Nustep  L5 x5 min     Other Machines for Strengthening  chest press 5#, seated row 20#, lats 20#, triceps 25#      Neck Exercises: Theraband   Shoulder Extension  20 reps;Red    Rows  20 reps;Red    Shoulder External Rotation  20 reps;Red      Neck Exercises: Standing   Other Standing Exercises  gait around building with 3# biceps and overhead presses    Other Standing Exercises  shrugs 5lb 2x10   with upper trap and levator stretches, weighted ball overhead reach               PT Short Term Goals - 08/21/17 1648      PT SHORT TERM GOAL #1   Title  independent with initial HEP    Status  Achieved        PT Long Term Goals - 09/21/17 1055      PT LONG TERM GOAL #2   Title  report HA frequency decreased 25%     Status  Partially Met      PT LONG TERM GOAL #3   Title  report HA intensity decreased 25%    Status  Partially Met            Plan - 09/21/17 1054    Clinical Impression Statement  Started the education process on HEP with tband for arms and legs and overall fitness.  She was able to tolerate an increase in exercises the last two days without any added Ha or neck pain, does need education on form to not elevate the shoulders    PT Next Visit Plan  start more strengthening in the gym try arms next    Consulted and Agree with Plan of Care  Patient       Patient will benefit from skilled therapeutic intervention in order to improve the following deficits and impairments:  Decreased range of motion, Increased fascial restricitons, Increased muscle spasms, Pain, Improper body mechanics, Postural dysfunction  Visit Diagnosis: Chronic tension-type headache, intractable  Cervicalgia     Problem List Patient Active Problem List   Diagnosis Date Noted  . Chronic migraine without aura, with intractable migraine, so stated, with status migrainosus 06/18/2017  . GAD (generalized anxiety disorder) 04/16/2013  . Bipolar 1 disorder, depressed ([place]) 04/16/2013    Margaret Boast., PT 09/21/2017, 10:56 AM  [address] [place], [place], [address] Phone: [phone]   Fax:  [phone]  Name: Margaret Lopez MRN: [id number] Date of Birth: April 23, 1970

## 2017-09-26 ENCOUNTER — Encounter: Payer: Self-pay | Admitting: Physical Therapy

## 2017-09-26 ENCOUNTER — Ambulatory Visit: Payer: BLUE CROSS/BLUE SHIELD | Admitting: Physical Therapy

## 2017-09-26 DIAGNOSIS — G44221 Chronic tension-type headache, intractable: Secondary | ICD-10-CM | POA: Diagnosis not present

## 2017-09-26 DIAGNOSIS — M542 Cervicalgia: Secondary | ICD-10-CM

## 2017-09-26 NOTE — Therapy (Signed)
[place] [address] [place], [place], [address] Phone: [phone]   Fax:  [phone]  Physical Therapy Treatment  Patient Details  Name: Margaret Lopez MRN: [id number] Date of Birth: 07-Nov-1970 Referring Provider: Jaynee Eagles   Encounter Date: 09/26/2017  PT End of Session - 09/26/17 1654    Visit Number  11    Date for PT Re-Evaluation  10/08/17    PT Start Time  1614    PT Stop Time  1711    PT Time Calculation (min)  57 min    Activity Tolerance  Patient tolerated treatment well    Behavior During Therapy  [facility] for tasks assessed/performed       Past Medical History:  Diagnosis Date  . Anxiety   . Bipolar disorder ([place])   . Depression   . Deviated nasal septum 02/2011  . Generalized anxiety disorder 04/16/2013  . GERD (gastroesophageal reflux disease)   . Headache(784.0)    migraines  . Insomnia   . Nasal turbinate hypertrophy 02/2011   bilat.  . Neuromuscular disorder [facility])    Dr. Jerilynn Mages. Domingo Cocking- does injections around nerve in her head for prevention of migraines - q 3 months   . OSA (obstructive sleep apnea)   . Pneumonia    hx of   . PONV (postoperative nausea and vomiting)   . Seasonal allergies   . Sleep apnea    uses BIPAP nightly; no longer using bipap, cleared from sleep apnea after bypass surgery    Past Surgical History:  Procedure Laterality Date  . ABDOMINAL HYSTERECTOMY    . CARPAL TUNNEL RELEASE Right   . DIAGNOSTIC LAPAROSCOPY    . FOOT SURGERY Right    bone removed from great toe   . GASTRIC ROUX-EN-Y N/A 05/16/2016   Procedure: LAPAROSCOPIC ROUX-EN-Y GASTRIC BYPASS WITH UPPER ENDOSCOPY;  Surgeon: Clovis Riley, MD;  [facility];  Service: General;  Laterality: N/A;  . LAPAROSCOPIC VAGINAL HYSTERECTOMY  03/14/2007  . LIPOMA EXCISION Left 08/26/2016   Procedure: EXCISION LIPOMA FROM LEFT UPPER POSTERIOR THIGH;  Surgeon: Clovis Riley, MD;  Location: [place];  Service: General;   Laterality: Left;  . MYRINGOTOMY WITH TUBE PLACEMENT Bilateral 03/02/2015   Procedure: MYRINGOTOMY WITH T-TUBE PLACEMENT;  Surgeon: Leta Baptist, MD;  Location: [place];  Service: ENT;  Laterality: Bilateral;  . NASAL SEPTOPLASTY W/ TURBINOPLASTY  02/22/2011   Procedure: NASAL SEPTOPLASTY WITH TURBINATE REDUCTION;  Surgeon: Ascencion Dike, MD;  Location: [place];  Service: ENT;  Laterality: Bilateral;  . TUBAL LIGATION  12/16/2004  . TYMPANOSTOMY TUBE PLACEMENT Bilateral 07/21/2017    There were no vitals filed for this visit.  Subjective Assessment - 09/26/17 1624    Subjective  Patient reports that she did well after the increased exercises, she reports that she was helping someone move over the weekend and was reaching into cabinets a lot and is having some increased neck pain    Currently in Pain?  Yes    Pain Score  4     Pain Location  Neck    Aggravating Factors   reaching                       [facility] Adult PT Treatment/Exercise - 09/26/17 0001      Neck Exercises: Machines for Strengthening   Nustep  L5 x6 min     Other Machines for Strengthening  chest press 5#, seated row 20#, lats 20#, triceps 25#      Neck Exercises: Standing   Other Standing Exercises  3# UE circuit      Moist Heat Therapy   Number Minutes Moist Heat  15 Minutes    Moist Heat Location  Cervical      Electrical Stimulation   Electrical Stimulation Location  rhomboids and cervical area    Electrical Stimulation Action  IFC    Electrical Stimulation Parameters  supine    Electrical Stimulation Goals  Pain      Manual Therapy   Manual Therapy  Soft tissue mobilization;Passive ROM;Manual Traction;Neural Stretch    Soft tissue mobilization  posterior cervical para spinales    Passive ROM  cervical spine full AROM    Manual Traction  cervical spine 5x10'' some with rotation               PT Short Term Goals - 08/21/17 1648      PT SHORT TERM GOAL #1    Title  independent with initial HEP    Status  Achieved        PT Long Term Goals - 09/21/17 1055      PT LONG TERM GOAL #2   Title  report HA frequency decreased 25%    Status  Partially Met      PT LONG TERM GOAL #3   Title  report HA intensity decreased 25%    Status  Partially Met            Plan - 09/26/17 1654    Clinical Impression Statement  Patient overall doing well, she has had some increased neck and upper trap tension with helping a friend move and having to reach into cabinet often.  She has some spasms in the upper trap and rhomboid.  Tolerating exercises well wihtout clenching jaw    PT Next Visit Plan  gave info about TENS, continue to work on what she can do on own    Consulted and Agree with Plan of Care  Patient       Patient will benefit from skilled therapeutic intervention in order to improve the following deficits and impairments:  Decreased range of motion, Increased fascial restricitons, Increased muscle spasms, Pain, Improper body mechanics, Postural dysfunction  Visit Diagnosis: Chronic tension-type headache, intractable  Cervicalgia     Problem List Patient Active Problem List   Diagnosis Date Noted  . Chronic migraine without aura, with intractable migraine, so stated, with status migrainosus 06/18/2017  . GAD (generalized anxiety disorder) 04/16/2013  . Bipolar 1 disorder, depressed ([place]) 04/16/2013    [place]ner Boast., PT 09/26/2017, 4:56 PM  [address] [place], [place], [address] Phone: [phone]   Fax:  [phone]  Name: KATHE WIRICK MRN: [id number] Date of Birth: [birth year]

## 2017-09-28 ENCOUNTER — Ambulatory Visit: Payer: BLUE CROSS/BLUE SHIELD | Admitting: Physical Therapy

## 2017-09-28 DIAGNOSIS — Z01419 Encounter for gynecological examination (general) (routine) without abnormal findings: Secondary | ICD-10-CM | POA: Diagnosis not present

## 2017-09-28 DIAGNOSIS — Z1231 Encounter for screening mammogram for malignant neoplasm of breast: Secondary | ICD-10-CM | POA: Diagnosis not present

## 2017-09-28 DIAGNOSIS — N951 Menopausal and female climacteric states: Secondary | ICD-10-CM | POA: Diagnosis not present

## 2017-09-28 DIAGNOSIS — Z6823 Body mass index (BMI) 23.0-23.9, adult: Secondary | ICD-10-CM | POA: Diagnosis not present

## 2017-10-03 ENCOUNTER — Ambulatory Visit: Payer: BLUE CROSS/BLUE SHIELD | Admitting: Physical Therapy

## 2017-10-05 ENCOUNTER — Encounter: Payer: Self-pay | Admitting: Physical Therapy

## 2017-10-05 ENCOUNTER — Encounter (HOSPITAL_COMMUNITY): Payer: Self-pay | Admitting: Psychiatry

## 2017-10-05 ENCOUNTER — Ambulatory Visit (INDEPENDENT_AMBULATORY_CARE_PROVIDER_SITE_OTHER): Payer: BLUE CROSS/BLUE SHIELD | Admitting: Psychiatry

## 2017-10-05 ENCOUNTER — Ambulatory Visit: Payer: BLUE CROSS/BLUE SHIELD | Admitting: Physical Therapy

## 2017-10-05 DIAGNOSIS — Z87891 Personal history of nicotine dependence: Secondary | ICD-10-CM | POA: Diagnosis not present

## 2017-10-05 DIAGNOSIS — F319 Bipolar disorder, unspecified: Secondary | ICD-10-CM | POA: Diagnosis not present

## 2017-10-05 DIAGNOSIS — G44221 Chronic tension-type headache, intractable: Secondary | ICD-10-CM | POA: Diagnosis not present

## 2017-10-05 DIAGNOSIS — F411 Generalized anxiety disorder: Secondary | ICD-10-CM | POA: Diagnosis not present

## 2017-10-05 DIAGNOSIS — M542 Cervicalgia: Secondary | ICD-10-CM

## 2017-10-05 MED ORDER — DIVALPROEX SODIUM ER 500 MG PO TB24: 2000.0000 mg | ORAL_TABLET | Freq: Every day | ORAL | 2 refills | Status: DC

## 2017-10-05 MED ORDER — LITHIUM CARBONATE ER 300 MG PO TBCR: 600.0000 mg | EXTENDED_RELEASE_TABLET | Freq: Every evening | ORAL | 2 refills | Status: DC

## 2017-10-05 MED ORDER — ALPRAZOLAM 0.5 MG PO TABS: 0.5000 mg | ORAL_TABLET | Freq: Two times a day (BID) | ORAL | 2 refills | Status: DC | PRN

## 2017-10-05 NOTE — Therapy (Signed)
[address] [place], [place], [address] Phone: [phone]   Fax:  [phone]  Physical Therapy Treatment  Patient Details  Name: Margaret Lopez MRN: [id number] Date of Birth: [birth year] Referring Provider: Jaynee Eagles   Encounter Date: 10/05/2017  PT End of Session - 10/05/17 1142    Visit Number  12    Date for PT Re-Evaluation  10/08/17    PT Start Time  1100    PT Stop Time  1156    PT Time Calculation (min)  56 min    Activity Tolerance  Patient tolerated treatment well    Behavior During Therapy  [facility] for tasks assessed/performed       Past Medical History:  Diagnosis Date  . Anxiety   . Bipolar disorder ([place])   . Depression   . Deviated nasal septum 02/2011  . Generalized anxiety disorder 04/16/2013  . GERD (gastroesophageal reflux disease)   . Headache(784.0)    migraines  . Insomnia   . Nasal turbinate hypertrophy 02/2011   bilat.  . Neuromuscular disorder [facility])    Dr. Jerilynn Mages. Domingo Cocking- does injections around nerve in her head for prevention of migraines - q 3 months   . OSA (obstructive sleep apnea)   . Pneumonia    hx of   . PONV (postoperative nausea and vomiting)   . Seasonal allergies   . Sleep apnea    uses BIPAP nightly; no longer using bipap, cleared from sleep apnea after bypass surgery    Past Surgical History:  Procedure Laterality Date  . ABDOMINAL HYSTERECTOMY    . CARPAL TUNNEL RELEASE Right   . DIAGNOSTIC LAPAROSCOPY    . FOOT SURGERY Right    bone removed from great toe   . GASTRIC ROUX-EN-Y N/A 05/16/2016   Procedure: LAPAROSCOPIC ROUX-EN-Y GASTRIC BYPASS WITH UPPER ENDOSCOPY;  Surgeon: Clovis Riley, MD;  [facility];  Service: General;  Laterality: N/A;  . LAPAROSCOPIC VAGINAL HYSTERECTOMY  03/14/2007  . LIPOMA EXCISION Left 08/26/2016   Procedure: EXCISION LIPOMA FROM LEFT UPPER POSTERIOR THIGH;  Surgeon: Clovis Riley, MD;  Location: [place];  Service: General;   Laterality: Left;  . MYRINGOTOMY WITH TUBE PLACEMENT Bilateral 03/02/2015   Procedure: MYRINGOTOMY WITH T-TUBE PLACEMENT;  Surgeon: Leta Baptist, MD;  Location: [place];  Service: ENT;  Laterality: Bilateral;  . NASAL SEPTOPLASTY W/ TURBINOPLASTY  02/22/2011   Procedure: NASAL SEPTOPLASTY WITH TURBINATE REDUCTION;  Surgeon: Ascencion Dike, MD;  Location: [place];  Service: ENT;  Laterality: Bilateral;  . TUBAL LIGATION  12/16/2004  . TYMPANOSTOMY TUBE PLACEMENT Bilateral 07/21/2017    There were no vitals filed for this visit.  Subjective Assessment - 10/05/17 1102    Subjective  "Feeling all right"some tightness on L side of neck    Currently in Pain?  No/denies                       [facility] Adult PT Treatment/Exercise - 10/05/17 0001      Neck Exercises: Machines for Strengthening   Nustep  L5 x6 min     Other Machines for Strengthening  chest press 5#, seated row 20#, lats 20#, triceps 25#      Neck Exercises: Theraband   Shoulder Extension  20 reps;Red    Rows  20 reps;Red      Moist Heat Therapy   Number Minutes Moist Heat  15 Minutes  Moist Heat Location  Cervical      Electrical Stimulation   Electrical Stimulation Location  rhomboids and cervical area    Electrical Stimulation Action  IFC    Electrical Stimulation Parameters  supine    Electrical Stimulation Goals  Pain      Manual Therapy   Manual Therapy  Soft tissue mobilization;Passive ROM;Manual Traction;Neural Stretch    Soft tissue mobilization  posterior cervical para spinales    Passive ROM  cervical spine full AROM    Manual Traction  cervical spine 5x10'' some with rotation               PT Short Term Goals - 08/21/17 1648      PT SHORT TERM GOAL #1   Title  independent with initial HEP    Status  Achieved        PT Long Term Goals - 09/21/17 1055      PT LONG TERM GOAL #2   Title  report HA frequency decreased 25%    Status  Partially Met       PT LONG TERM GOAL #3   Title  report HA intensity decreased 25%    Status  Partially Met            Plan - 10/05/17 1143    Clinical Impression Statement  Overall pt is continuing to do well, she reports some tightness in her neck as well as some life stresses. No weights increased but she reports that they feel heavier, Postural cues needed with standing rows and extension to keep head up and shoulder back.     Rehab Potential  Good    PT Treatment/Interventions  ADLs/Self Care Home Management;Cryotherapy;Electrical Stimulation;Moist Heat;Traction;Ultrasound;Patient/family education;Manual techniques;Dry needling    PT Next Visit Plan  continue to work on what she can do on own       Patient will benefit from skilled therapeutic intervention in order to improve the following deficits and impairments:  Decreased range of motion, Increased fascial restricitons, Increased muscle spasms, Pain, Improper body mechanics, Postural dysfunction  Visit Diagnosis: Chronic tension-type headache, intractable  Cervicalgia     Problem List Patient Active Problem List   Diagnosis Date Noted  . Chronic migraine without aura, with intractable migraine, so stated, with status migrainosus 06/18/2017  . GAD (generalized anxiety disorder) 04/16/2013  . Bipolar 1 disorder, depressed ([place]) 04/16/2013    Scot Jun, PTA 10/05/2017, 11:45 AM  [place] [address], [place], [address] Phone: [phone]   Fax:  [phone]  Name: Margaret Lopez MRN: [id number] Date of Birth: 28-Nov-1970

## 2017-10-05 NOTE — Progress Notes (Signed)
BH MD/PA/NP OP Progress Note  10/05/2017 9:38 AM Margaret Lopez  MRN:  [id number]  Chief Complaint:  Chief Complaint    Anxiety     HPI: Patient reports that she is having severe anxiety.  She has near constant racing thoughts, irritability, restlessness, palpitations.  Patient feels as though she never accomplishes the things that she needs to on a daily basis.  She feels constantly overwhelmed and unmotivated.  She is denying depression.  She notes that she is cried for 5 times since her last visit and that she has a desire to isolate but is not doing so.  She states her sleep is poor unless she takes her Xanax or Vistaril at bedtime.  She denies any manic or hypomanic-like symptoms since her last visit.  She denies SI/HI.  Patient has been taking 1 tablet of lithium and 1 tablet of Depakote at 6:30 PM and takes the other medication around 9:30pm before she goes to bed.  She states that her medication still does not feel as though is the right combination.  I had the patient engage in a list making activity and at the end of it her anxiety visibly decreased and she breathed a sigh of relief.     Visit Diagnosis:    ICD-10-CM   1. Generalized anxiety disorder F41.1 ALPRAZolam (XANAX) 0.5 MG tablet  2. Bipolar 1 disorder, depressed (HCC) F31.9 ALPRAZolam (XANAX) 0.5 MG tablet    lithium carbonate (LITHOBID) 300 MG CR tablet    divalproex (DEPAKOTE ER) 500 MG 24 hr tablet      Past Psychiatric History:  Anxiety:Yes Bipolar Disorder:Yes Depression:Yes Mania:Yes Psychosis:No Schizophrenia:No Personality Disorder:No Hospitalization for psychiatric illness:No History of Electroconvulsive Shock Therapy:No Prior Suicide Attempts:No    Past Medical History:  Past Medical History:  Diagnosis Date  . Anxiety   . Bipolar disorder ([place])   . Depression   . Deviated nasal septum 02/2011  . Generalized anxiety disorder 04/16/2013  . GERD (gastroesophageal reflux disease)   .  Headache(784.0)    migraines  . Insomnia   . Nasal turbinate hypertrophy 02/2011   bilat.  . Neuromuscular disorder [facility])    Dr. Jerilynn Mages. Domingo Cocking- does injections around nerve in her head for prevention of migraines - q 3 months   . OSA (obstructive sleep apnea)   . Pneumonia    hx of   . PONV (postoperative nausea and vomiting)   . Seasonal allergies   . Sleep apnea    uses BIPAP nightly; no longer using bipap, cleared from sleep apnea after bypass surgery    Past Surgical History:  Procedure Laterality Date  . ABDOMINAL HYSTERECTOMY    . CARPAL TUNNEL RELEASE Right   . DIAGNOSTIC LAPAROSCOPY    . FOOT SURGERY Right    bone removed from great toe   . GASTRIC ROUX-EN-Y N/A 05/16/2016   Procedure: LAPAROSCOPIC ROUX-EN-Y GASTRIC BYPASS WITH UPPER ENDOSCOPY;  Surgeon: Clovis Riley, MD;  [facility];  Service: General;  Laterality: N/A;  . LAPAROSCOPIC VAGINAL HYSTERECTOMY  03/14/2007  . LIPOMA EXCISION Left 08/26/2016   Procedure: EXCISION LIPOMA FROM LEFT UPPER POSTERIOR THIGH;  Surgeon: Clovis Riley, MD;  Location: [place];  Service: General;  Laterality: Left;  . MYRINGOTOMY WITH TUBE PLACEMENT Bilateral 03/02/2015   Procedure: MYRINGOTOMY WITH T-TUBE PLACEMENT;  Surgeon: Leta Baptist, MD;  Location: [place];  Service: ENT;  Laterality: Bilateral;  . NASAL SEPTOPLASTY W/ TURBINOPLASTY  02/22/2011   Procedure: NASAL SEPTOPLASTY  WITH TURBINATE REDUCTION;  Surgeon: Ascencion Dike, MD;  Location: [place];  Service: ENT;  Laterality: Bilateral;  . TUBAL LIGATION  12/16/2004  . TYMPANOSTOMY TUBE PLACEMENT Bilateral 07/21/2017    Family Psychiatric History:  Family History  Problem Relation Age of Onset  . ADD / ADHD Sister   . Hypertension Mother   . Sarcoidosis Mother   . Rheum arthritis Mother   . Hypertension Father   . Heart Problems Father   . Alcohol abuse Maternal Grandmother   . Bipolar disorder Maternal Grandmother   . Breast cancer Maternal  Grandmother   . Breast cancer Maternal Aunt   . Heart Problems Other        dad's side of family  . Suicidality Neg Hx     Social History:  Social History   Socioeconomic History  . Marital status: Legally Separated    Spouse name: Not on file  . Number of children: 2  . Years of education: Not on file  . Highest education level: Some college, no degree  Occupational History  . Not on file  Social Needs  . Financial resource strain: Not on file  . Food insecurity:    Worry: Not on file    Inability: Not on file  . Transportation needs:    Medical: Not on file    Non-medical: Not on file  Tobacco Use  . Smoking status: Former Smoker    Packs/day: 0.50    Years: 4.00    Pack years: 2.00    Types: Cigarettes    Last attempt to quit: 11/12/2015    Years since quitting: 1.8  . Smokeless tobacco: Never Used  Substance and Sexual Activity  . Alcohol use: No    Alcohol/week: 0.0 standard drinks    Comment: once a month or less  . Drug use: No  . Sexual activity: Yes    Partners: Male    Birth control/protection: Surgical    Comment: Hysterectomy  Lifestyle  . Physical activity:    Days per week: 5 days    Minutes per session: 40 min  . Stress: Only a little  Relationships  . Social connections:    Talks on phone: Not on file    Gets together: Not on file    Attends religious service: Not on file    Active member of club or organization: Not on file    Attends meetings of clubs or organizations: Not on file    Relationship status: Not on file  Other Topics Concern  . Not on file  Social History Narrative   Lives at home with boyfriend   Right handed   Caffeine: 2 cups daily    Allergies:  Allergies  Allergen Reactions  . Doxazosin Nausea And Vomiting  . Morphine And Related Itching  . Phenergan [Promethazine Hcl]     " knocks out for several hours"  . Aloe Hives  . Penicillins Rash    Has patient had a PCN reaction causing immediate rash,  facial/tongue/throat swelling, SOB or lightheadedness with hypotension: No Has patient had a PCN reaction causing severe rash involving mucus membranes or skin necrosis: No Has patient had a PCN reaction that required hospitalization No Has patient had a PCN reaction occurring within the last 10 years: No If all of the above answers are "NO", then may proceed with Cephalosporin use.   . Sulfa Antibiotics Rash    Metabolic Disorder Labs: No results found for: HGBA1C, MPG No  results found for: PROLACTIN No results found for: CHOL, TRIG, HDL, CHOLHDL, VLDL, LDLCALC No results found for: TSH  Therapeutic Level Labs: No results found for: LITHIUM No results found for: VALPROATE No components found for:  CBMZ  Current Medications: Current Outpatient Medications  Medication Sig Dispense Refill  . acetaminophen (TYLENOL) 500 MG tablet Take 1,000 mg by mouth every 6 (six) hours as needed (for pain/headaches).     [name] albuterol (PROVENTIL HFA;VENTOLIN HFA) 108 (90 Base) MCG/ACT inhaler Inhale 1-2 puffs into the lungs every 6 (six) hours as needed for wheezing or shortness of breath.    . ALPRAZolam (XANAX) 0.5 MG tablet Take 1 tablet (0.5 mg total) by mouth 2 (two) times daily as needed for anxiety. 60 tablet 2  . cyclobenzaprine (FLEXERIL) 10 MG tablet Take 5-10 mg by mouth at bedtime as needed. TMJ  2  . Diclofenac Potassium (CAMBIA) 50 MG PACK Mix 1 pack (50 mg) with 1-2 oz of water and drink. May repeat in 12 hours if needed. Max 2 packs in 24 hours and max 9 packs per month. 9 each 3  . divalproex (DEPAKOTE ER) 500 MG 24 hr tablet Take 4 tablets (2,000 mg total) by mouth daily. 120 tablet 2  . EPINEPHrine 0.3 mg/0.3 mL IJ SOAJ injection Inject 0.3 mg into the muscle daily as needed (for anaphylatic reactions.).   1  . Erenumab-aooe (AIMOVIG) 140 MG/ML SOAJ Inject 140 mg into the skin every 30 (thirty) days. 1 pen 11  . hydrOXYzine (ATARAX/VISTARIL) 25 MG tablet Take 25 mg by mouth at bedtime  as needed for itching.   3  . ipratropium (ATROVENT) 0.06 % nasal spray Place 2 sprays into both nostrils daily as needed. Allergies, runny nose.  4  . levocetirizine (XYZAL) 5 MG tablet Take 5 mg by mouth at bedtime.     [name] lithium carbonate (LITHOBID) 300 MG CR tablet Take 2 tablets (600 mg total) by mouth every evening. 60 tablet 2  . Multiple Vitamins-Minerals (OPURITY BYPASS OPTIMIZED) CHEW Chew 1 tablet by mouth daily.    . ondansetron (ZOFRAN-ODT) 4 MG disintegrating tablet Take 4 mg by mouth every 6 (six) hours as needed for nausea or vomiting.   0  . prochlorperazine (COMPAZINE) 10 MG tablet Take 1 tablet (10 mg total) by mouth 3 (three) times daily as needed for nausea or vomiting (migraine). MAY CAUSE SEDATION. 30 tablet 3  . valACYclovir (VALTREX) 500 MG tablet Take 500 mg by mouth See admin instructions. Takes 500mg  daily for 5 days when she has a flare up.  3  . dihydroergotamine (MIGRANAL) 4 MG/ML nasal spray 1 spray into each nostril. Don't tilt your head back, sniff through your nose, or blow your nose. May repeat in 15 minutes. Max 4 sprays/day. (Patient not taking: Reported on 08/17/2017) 8 mL 12  . zonisamide (ZONEGRAN) 25 MG capsule Take 50 mg by mouth at bedtime. Working up to 3 tablets at bedtime  2   No current facility-administered medications for this visit.      Musculoskeletal: Strength & Muscle Tone: within normal limits Gait & Station: normal Patient leans: N/A  Psychiatric Specialty Exam: Review of Systems  Constitutional: Positive for malaise/fatigue. Negative for chills and fever.  Cardiovascular: Positive for palpitations. Negative for chest pain and leg swelling.    Blood pressure 100/64, pulse 70, height 5' 3.5" (1.613 m), weight 134 lb (60.8 kg), SpO2 94 %.Body mass index is 23.36 kg/m.  General Appearance: Casual  Eye Contact:  Good  Speech:  Clear and Coherent and pushed  Volume:  Normal  Mood:  Anxious  Affect:  Appropriate and Congruent  Thought  Process:  Coherent and Descriptions of Associations: Intact  Orientation:  Full (Time, Place, and Person)  Thought Content:  Logical  Suicidal Thoughts:  No  Homicidal Thoughts:  No  Memory:  Immediate;   Good  Judgement:  Good  Insight:  Good  Psychomotor Activity:  Restlessness  Concentration:  Concentration: Good  Recall:  Good  Fund of Knowledge:  Good  Language:  Good  Akathisia:  No  Handed:  Right  AIMS (if indicated):     Assets:  Communication Skills Desire for Improvement Financial Resources/Insurance Housing Intimacy Social Support Talents/Skills Transportation  ADL's:  Intact  Cognition:  WNL  Sleep:   fair      Screenings: PHQ2-9     Nutrition from 07/12/2016 [facility] Nutrition from 05/09/2016 in [facility] Nutrition from 02/05/2016 in [facility]  PHQ-2 Total Score  0  0  0      I reviewed the information below on 10/05/2017 and have updated it Assessment and Plan: Bipolar I disorder; GAD    Medication management with supportive therapy. Risks and benefits, side effects and alternative treatment options discussed with patient. Pt was given an opportunity to ask questions about medication, illness, and treatment. All current psychiatric medications have been reviewed and discussed with the patient and adjusted as clinically appropriate. The patient has been provided an accurate and updated list of the medications being now prescribed. Patient expressed understanding of how their medications were to be used.  Pt verbalized understanding and verbal consent obtained for treatment.  The risk of un-intended pregnancy is low based on the fact that pt reports she had a hysterecomy. Pt is aware that these meds carry a teratogenic risk. Pt will discuss plan of action if she does or plans to become pregnant in the future.  Status of current problems: worsening  anxiety  Meds: Lithium CR 600mg  po qPM for Bipolar disorder Increase Xanax 0.5mg  po BID prn anxiety  increase Depakote ER 2000mg  po qD for  Bipolar disorder- at bedtime    Labs: will do Depakote level at next visit  Therapy: brief supportive therapy provided. Discussed psychosocial stressors in detail.   Reviewed ways to manage anxiety and had patient make a list of her activities today and check off several things  Consultations: none  Pt denies SI and is at an acute low risk for suicide. Patient told to call clinic if any problems occur. Patient advised to go to ER if they should develop SI/HI, side effects, or if symptoms worsen. Has crisis numbers to call if needed. Pt verbalized understanding.  F/up in 2 months or sooner if needed  The duration of this appointment visit was 39minutes of face-to-face time with the patient. Greater than 50% of this time was spent in counseling, explanation of diagnosis, planning of further management, and coordination of care   Charlcie Cradle, MD 10/05/2017, 9:38 AM

## 2017-10-11 ENCOUNTER — Ambulatory Visit: Payer: BLUE CROSS/BLUE SHIELD | Attending: Neurology | Admitting: Physical Therapy

## 2017-10-11 ENCOUNTER — Encounter: Payer: Self-pay | Admitting: Physical Therapy

## 2017-10-11 DIAGNOSIS — M542 Cervicalgia: Secondary | ICD-10-CM | POA: Diagnosis not present

## 2017-10-11 DIAGNOSIS — G44221 Chronic tension-type headache, intractable: Secondary | ICD-10-CM | POA: Diagnosis not present

## 2017-10-11 NOTE — Therapy (Signed)
[place] [address], [place], [address] Phone: [phone]   Fax:  [phone]  Physical Therapy Treatment  Patient Details  Name: Margaret Lopez MRN: [id number] Date of Birth: 1970/04/02 Referring Provider (PT): Jaynee Eagles   Encounter Date: 10/11/2017  PT End of Session - 10/11/17 1726    Visit Number  13    Date for PT Re-Evaluation  11/11/17    PT Start Time  1646    PT Stop Time  1742    PT Time Calculation (min)  56 min    Activity Tolerance  Patient tolerated treatment well    Behavior During Therapy  [facility] for tasks assessed/performed       Past Medical History:  Diagnosis Date  . Anxiety   . Bipolar disorder ([place])   . Depression   . Deviated nasal septum 02/2011  . Generalized anxiety disorder 04/16/2013  . GERD (gastroesophageal reflux disease)   . Headache(784.0)    migraines  . Insomnia   . Nasal turbinate hypertrophy 02/2011   bilat.  . Neuromuscular disorder [facility])    Dr. Jerilynn Mages. Domingo Cocking- does injections around nerve in her head for prevention of migraines - q 3 months   . OSA (obstructive sleep apnea)   . Pneumonia    hx of   . PONV (postoperative nausea and vomiting)   . Seasonal allergies   . Sleep apnea    uses BIPAP nightly; no longer using bipap, cleared from sleep apnea after bypass surgery    Past Surgical History:  Procedure Laterality Date  . ABDOMINAL HYSTERECTOMY    . CARPAL TUNNEL RELEASE Right   . DIAGNOSTIC LAPAROSCOPY    . FOOT SURGERY Right    bone removed from great toe   . GASTRIC ROUX-EN-Y N/A 05/16/2016   Procedure: LAPAROSCOPIC ROUX-EN-Y GASTRIC BYPASS WITH UPPER ENDOSCOPY;  Surgeon: Clovis Riley, MD;  [facility];  Service: General;  Laterality: N/A;  . LAPAROSCOPIC VAGINAL HYSTERECTOMY  03/14/2007  . LIPOMA EXCISION Left 08/26/2016   Procedure: EXCISION LIPOMA FROM LEFT UPPER POSTERIOR THIGH;  Surgeon: Clovis Riley, MD;  Location: [place];  Service: General;   Laterality: Left;  . MYRINGOTOMY WITH TUBE PLACEMENT Bilateral 03/02/2015   Procedure: MYRINGOTOMY WITH T-TUBE PLACEMENT;  Surgeon: Leta Baptist, MD;  Location: [place];  Service: ENT;  Laterality: Bilateral;  . NASAL SEPTOPLASTY W/ TURBINOPLASTY  02/22/2011   Procedure: NASAL SEPTOPLASTY WITH TURBINATE REDUCTION;  Surgeon: Ascencion Dike, MD;  Location: [place];  Service: ENT;  Laterality: Bilateral;  . TUBAL LIGATION  12/16/2004  . TYMPANOSTOMY TUBE PLACEMENT Bilateral 07/21/2017    There were no vitals filed for this visit.  Subjective Assessment - 10/11/17 1650    Subjective  Reports had a migraine Th, F, Sat.  Feels like it was the storm front that came through.  She reports that she was able to do yardwork with just mm soreness, she reports that she feels stronger and theat she would not have been able to do this prior to starting PT    Currently in Pain?  No/denies                       [facility] Adult PT Treatment/Exercise - 10/11/17 0001      Neck Exercises: Machines for Strengthening   UBE (Upper Arm Bike)  constant work 15 watts 4 minutes    Nustep  L5 x6  min     Other Machines for Strengthening  leg extension 10#, 25# HS curls all 3x10, leg press 40# 3x10, single legs 20#      Neck Exercises: Supine   Other Supine Exercise  blue tband clamshell bridges    Other Supine Exercise  feet on ball bridges and then abdominal isometrics      Moist Heat Therapy   Number Minutes Moist Heat  10 Minutes    Moist Heat Location  Cervical      Electrical Stimulation   Electrical Stimulation Location  rhomboids and cervical area    Electrical Stimulation Action  IFC    Electrical Stimulation Parameters  supine    Electrical Stimulation Goals  Pain      Manual Therapy   Manual Therapy  Soft tissue mobilization;Passive ROM;Manual Traction;Neural Stretch    Passive ROM  cervical spine full AROM    Manual Traction  cervical spine 5x10'' some with  rotation               PT Short Term Goals - 08/21/17 1648      PT SHORT TERM GOAL #1   Title  independent with initial HEP    Status  Achieved        PT Long Term Goals - 10/11/17 1729      PT LONG TERM GOAL #3   Title  report HA intensity decreased 25%    Status  Partially Met      PT LONG TERM GOAL #4   Title  independent with advanced HEP    Status  Partially Met            Plan - 10/11/17 1727    Clinical Impression Statement  Patient had a migraine last week but attributed it to the weather.  She reports that she was able to do some yardwork this week, reports that prior to starting PT she could not do it due to lack of mms.      PT Next Visit Plan  continue to work on what she can do on own at home with advanced HEP or at a gym    Consulted and Agree with Plan of Care  Patient       Patient will benefit from skilled therapeutic intervention in order to improve the following deficits and impairments:  Decreased range of motion, Increased fascial restricitons, Increased muscle spasms, Pain, Improper body mechanics, Postural dysfunction  Visit Diagnosis: Chronic tension-type headache, intractable  Cervicalgia     Problem List Patient Active Problem List   Diagnosis Date Noted  . Chronic migraine without aura, with intractable migraine, so stated, with status migrainosus 06/18/2017  . GAD (generalized anxiety disorder) 04/16/2013  . Bipolar 1 disorder, depressed (HCC) 04/16/2013    , W., PT 10/11/2017, 5:30 PM  [facility] [address] [place], [place], [address] Phone: [phone]   Fax:  [phone]  Name: Margaret Lopez MRN: [id number] Date of Birth: 07/09/1970   

## 2017-10-16 DIAGNOSIS — J301 Allergic rhinitis due to pollen: Secondary | ICD-10-CM | POA: Diagnosis not present

## 2017-10-16 DIAGNOSIS — J3081 Allergic rhinitis due to animal (cat) (dog) hair and dander: Secondary | ICD-10-CM | POA: Diagnosis not present

## 2017-10-16 DIAGNOSIS — J3089 Other allergic rhinitis: Secondary | ICD-10-CM | POA: Diagnosis not present

## 2017-10-16 DIAGNOSIS — R05 Cough: Secondary | ICD-10-CM | POA: Diagnosis not present

## 2017-10-17 ENCOUNTER — Telehealth: Payer: Self-pay | Admitting: Neurology

## 2017-10-17 NOTE — Telephone Encounter (Signed)
I called and requested a refill on the patients Botox with Prime [id number].

## 2017-10-18 ENCOUNTER — Telehealth: Payer: Self-pay | Admitting: Neurology

## 2017-10-18 NOTE — Telephone Encounter (Signed)
Ok to fit in for a nerve block if she calls thanks

## 2017-10-18 NOTE — Telephone Encounter (Signed)
Called patient and advised her that unfortunately we do not have any appointments available today. Informed her that tomorrow is full as well however encouraged her to call back tomorrow morning to see if we have any openings. Pt stated that she would do that. Pt also advised that if she gets worse we advise her to go to the ED. She has taken Cambia. She is aware that she can take this BID (12 hours apart). She verbalized appreciation and understanding.

## 2017-10-18 NOTE — Telephone Encounter (Signed)
Noted  

## 2017-10-18 NOTE — Telephone Encounter (Signed)
Pt requesting a call stating due to h/a she would like to discuss coming in for an injection- stating "the one that numbed my head". Please advise

## 2017-10-18 NOTE — Telephone Encounter (Signed)
Patient calling again stating has a bad headache and was hoping she could come in today for an injection to numb her head.

## 2017-10-19 ENCOUNTER — Ambulatory Visit: Payer: BLUE CROSS/BLUE SHIELD | Admitting: Physical Therapy

## 2017-10-19 ENCOUNTER — Encounter (INDEPENDENT_AMBULATORY_CARE_PROVIDER_SITE_OTHER): Payer: Self-pay

## 2017-10-19 ENCOUNTER — Ambulatory Visit (INDEPENDENT_AMBULATORY_CARE_PROVIDER_SITE_OTHER): Payer: BLUE CROSS/BLUE SHIELD | Admitting: Neurology

## 2017-10-19 VITALS — BP 101/59 | HR 58

## 2017-10-19 DIAGNOSIS — G43011 Migraine without aura, intractable, with status migrainosus: Secondary | ICD-10-CM

## 2017-10-19 DIAGNOSIS — R51 Headache: Secondary | ICD-10-CM | POA: Diagnosis not present

## 2017-10-19 DIAGNOSIS — R519 Headache, unspecified: Secondary | ICD-10-CM

## 2017-10-19 NOTE — Telephone Encounter (Signed)
Pt requesting a call stating the is still have a terrible h/a would like to discuss coming in today. Please advise

## 2017-10-19 NOTE — Progress Notes (Signed)
Nerve block w/o steroid: Pt signed consent  0.5% Bupivocaine 9 mL LOT: [id number] EXP: 06/11/2019 NDC: 17127-871-83  2% Lidocaine 9 mL LOT: [id number] EXP: 02/11/2019 NDC: 6725-5001-64

## 2017-10-19 NOTE — Telephone Encounter (Signed)
Called pt and offered her appt for 3:00 pm today arrival time 2:45. Pt verbalized appreciation. She stated this was the same headache as yesterday.   Pt scheduled.

## 2017-10-19 NOTE — Progress Notes (Signed)
  Performed by Dr. Jaynee Eagles M.D.  All procedures a documented blood were medically necessary, reasonable and appropriate based on the patient's history, medical diagnosis and physician opinion. Verbal informed consent was obtained from the patient, patient was informed of potential risk of procedure, including bruising, bleeding, hematoma formation, infection, muscle weakness, muscle pain, numbness, transient hypertension, transient hyperglycemia and transient insomnia among others. All areas injected were topically clean with isopropyl rubbing alcohol. Nonsterile nonlatex gloves were worn during the procedure.  1. Greater occipital nerve block [phone]). The greater occipital nerve site was identified at the nuchal line medial to the occipital artery. Medication was injected into the left and right occipital nerve areas and suboccipital areas. Patient's condition is associated with inflammation of the greater occipital nerve and associated multiple groups. Injection was deemed medically necessary, reasonable and appropriate. Injection represents a separate and unique surgical service.  2. Lesser occipital nerve block [phone]). The lesser occipital nerve site was identified approximately 2 cm lateral to the greater occipital nerve. Occasion was injected into the left and right occipital nerve areas. Patient's condition is associated with inflammation of the lesser occipital nerve and associated muscle groups. Injection was deemed medically necessary, reasonable and appropriate. Injection represents a separate and unique surgical service.   3. Auriculotemporal nerve block ([id number]): The Auriculotemporal nerve site was identified along the posterior margin of the sternocleidomastoid muscle toward the base of the ear. Medication was injected into the left and right radicular temporal nerve areas. Patient's condition is associated with inflammation of the Auriculotemporal Nerve and associated muscle groups. Injection was  deemed medically necessary, reasonable and appropriate. Injection represents a separate and unique surgical service.  4. Supraorbital nerve block ([id number]): Supraorbital nerve site was identified along the incision of the frontal bone on the orbital/supraorbital ridge. Medication was injected into the left and right supraorbital nerve areas. Patient's condition is associated with inflammation of the supraorbital and associated muscle groups. Injection was deemed medically necessary, reasonable and appropriate. Injection represents a separate and unique surgical service.

## 2017-10-23 ENCOUNTER — Ambulatory Visit: Payer: BLUE CROSS/BLUE SHIELD | Admitting: Physical Therapy

## 2017-10-23 ENCOUNTER — Encounter: Payer: Self-pay | Admitting: Physical Therapy

## 2017-10-23 DIAGNOSIS — M542 Cervicalgia: Secondary | ICD-10-CM

## 2017-10-23 DIAGNOSIS — G44221 Chronic tension-type headache, intractable: Secondary | ICD-10-CM

## 2017-10-23 NOTE — Therapy (Signed)
[place] [address] [place], [place], [address] Phone: [phone]   Fax:  [phone]  Physical Therapy Treatment  Patient Details  Name: Margaret Lopez MRN: [id number] Date of Birth: 1970-08-18 Referring Provider (PT): Forrestine Him Date: 10/23/2017  PT End of Session - 10/23/17 1722    Visit Number  14    Date for PT Re-Evaluation  11/11/17    PT Start Time  7341    PT Stop Time  1734    PT Time Calculation (min)  44 min    Activity Tolerance  Patient tolerated treatment well    Behavior During Therapy  [facility] for tasks assessed/performed       Past Medical History:  Diagnosis Date  . Anxiety   . Bipolar disorder ([place])   . Depression   . Deviated nasal septum 02/2011  . Generalized anxiety disorder 04/16/2013  . GERD (gastroesophageal reflux disease)   . Headache(784.0)    migraines  . Insomnia   . Nasal turbinate hypertrophy 02/2011   bilat.  . Neuromuscular disorder [facility])    Dr. Jerilynn Mages. Domingo Cocking- does injections around nerve in her head for prevention of migraines - q 3 months   . OSA (obstructive sleep apnea)   . Pneumonia    hx of   . PONV (postoperative nausea and vomiting)   . Seasonal allergies   . Sleep apnea    uses BIPAP nightly; no longer using bipap, cleared from sleep apnea after bypass surgery    Past Surgical History:  Procedure Laterality Date  . ABDOMINAL HYSTERECTOMY    . CARPAL TUNNEL RELEASE Right   . DIAGNOSTIC LAPAROSCOPY    . FOOT SURGERY Right    bone removed from great toe   . GASTRIC ROUX-EN-Y N/A 05/16/2016   Procedure: LAPAROSCOPIC ROUX-EN-Y GASTRIC BYPASS WITH UPPER ENDOSCOPY;  Surgeon: Clovis Riley, MD;  [facility];  Service: General;  Laterality: N/A;  . LAPAROSCOPIC VAGINAL HYSTERECTOMY  03/14/2007  . LIPOMA EXCISION Left 08/26/2016   Procedure: EXCISION LIPOMA FROM LEFT UPPER POSTERIOR THIGH;  Surgeon: Clovis Riley, MD;  Location: [place];  Service:  General;  Laterality: Left;  . MYRINGOTOMY WITH TUBE PLACEMENT Bilateral 03/02/2015   Procedure: MYRINGOTOMY WITH T-TUBE PLACEMENT;  Surgeon: Leta Baptist, MD;  Location: [place];  Service: ENT;  Laterality: Bilateral;  . NASAL SEPTOPLASTY W/ TURBINOPLASTY  02/22/2011   Procedure: NASAL SEPTOPLASTY WITH TURBINATE REDUCTION;  Surgeon: Ascencion Dike, MD;  Location: [place];  Service: ENT;  Laterality: Bilateral;  . TUBAL LIGATION  12/16/2004  . TYMPANOSTOMY TUBE PLACEMENT Bilateral 07/21/2017    There were no vitals filed for this visit.  Subjective Assessment - 10/23/17 1653    Subjective  Patient reports that she is having migraine HA again, unsure of cause, reports that it did rain and it may have an effect.    Currently in Pain?  Yes    Pain Score  4     Pain Location  Neck    Pain Descriptors / Indicators  Spasm                       [facility] Adult PT Treatment/Exercise - 10/23/17 0001      Neck Exercises: Machines for Strengthening   Nustep  L5 x6 min       Moist Heat Therapy   Number Minutes Moist Heat  15 Minutes  Moist Heat Location  Cervical      Electrical Stimulation   Electrical Stimulation Location  rhomboids and cervical area    Electrical Stimulation Action  IFC    Electrical Stimulation Parameters  supine    Electrical Stimulation Goals  Pain      Manual Therapy   Manual Therapy  Soft tissue mobilization;Passive ROM;Manual Traction;Neural Stretch    Soft tissue mobilization  rhomboids, upper traps and the cervical parapsinals, very tender today    Passive ROM  cervical spine full AROM    Manual Traction  cervical spine 5x10'' some with rotation               PT Short Term Goals - 08/21/17 1648      PT SHORT TERM GOAL #1   Title  independent with initial HEP    Status  Achieved        PT Long Term Goals - 10/11/17 1729      PT LONG TERM GOAL #3   Title  report HA intensity decreased 25%    Status   Partially Met      PT LONG TERM GOAL #4   Title  independent with advanced HEP    Status  Partially Met            Plan - 10/23/17 1723    Clinical Impression Statement  Patient with another report of migraine, also much more tender and sensitive to palpation inthe rhomboids and the upper traps, had significant knots in the upper traps.    PT Next Visit Plan  see how the treatment did with her trigger points, she declined DN saying that she was very sore the time that we did it.    Consulted and Agree with Plan of Care  Patient       Patient will benefit from skilled therapeutic intervention in order to improve the following deficits and impairments:     Visit Diagnosis: Chronic tension-type headache, intractable  Cervicalgia     Problem List Patient Active Problem List   Diagnosis Date Noted  . Chronic migraine without aura, with intractable migraine, so stated, with status migrainosus 06/18/2017  . GAD (generalized anxiety disorder) 04/16/2013  . Bipolar 1 disorder, depressed ([place]) 04/16/2013    Sumner Boast., PT 10/23/2017, 5:24 PM  [address] [place], [place], [address] Phone: [phone]   Fax:  [phone]  Name: Margaret Lopez MRN: [id number] Date of Birth: 1970/09/09

## 2017-10-23 NOTE — Telephone Encounter (Signed)
I called to check status of the patient medication. The pharmacy had already scheduled it for delivery for tomorrow but no one verified the benefits, therefor the process will have to start all over. They have marked it stat.

## 2017-10-24 DIAGNOSIS — B369 Superficial mycosis, unspecified: Secondary | ICD-10-CM | POA: Diagnosis not present

## 2017-10-25 ENCOUNTER — Ambulatory Visit (INDEPENDENT_AMBULATORY_CARE_PROVIDER_SITE_OTHER): Payer: BLUE CROSS/BLUE SHIELD | Admitting: Neurology

## 2017-10-25 DIAGNOSIS — G43711 Chronic migraine without aura, intractable, with status migrainosus: Secondary | ICD-10-CM

## 2017-10-25 MED ORDER — SUMATRIPTAN SUCCINATE 3 MG/0.5ML ~~LOC~~ SOAJ: 3.0000 mg | SUBCUTANEOUS | 11 refills | Status: DC

## 2017-10-25 NOTE — Progress Notes (Signed)
Botox- 100 units x 2 vials Lot: [id number] Expiration: 04/2020 NDC: 6701-4103-01  Bacteriostatic 0.9% Sodium Chloride- 66mL total Lot: [id number] Expiration: 10/11/2018 NDC: 8757-9728-20  Dx: U01.561 S/P

## 2017-10-25 NOTE — Progress Notes (Signed)
Consent Form Botulism Toxin Injection For Chronic Migraine  This is her second injection. Baseline 15 headache days a month and 10 migraine days a month. Excellent response, > 50% reduction in migraines and headaches monthly. Discussed acute management gave zomig samples and prescribed imitrex injection. Meds ordered this encounter  Medications  . SUMAtriptan Succinate (ZEMBRACE SYMTOUCH) 3 MG/0.5ML SOAJ    Sig: Inject 3 mg into the skin every 15 (fifteen) minutes. May repeat again in 2 hours Maximum 4 in one day.    Dispense:  12 pen    Refill:  11    Patient has a $0 copay card regardless of insurance coverage. She will bring it to pick up.   Patient felt her forehead and eyebrows were uncomfortable due to inability to move them, reduced injections 1/2 injections in the corrigators and procerus, did the frontal very high, +10 each masseter, +5 each lateral pterygoid, +5 bilat levator scapulae. +temples.  Reviewed orally with patient, additionally signature is on file:  Botulism toxin has been approved by the Federal drug administration for treatment of chronic migraine. Botulism toxin does not cure chronic migraine and it may not be effective in some patients.  The administration of botulism toxin is accomplished by injecting a small amount of toxin into the muscles of the neck and head. Dosage must be titrated for each individual. Any benefits resulting from botulism toxin tend to wear off after 3 months with a repeat injection required if benefit is to be maintained. Injections are usually done every 3-4 months with maximum effect peak achieved by about 2 or 3 weeks. Botulism toxin is expensive and you should be sure of what costs you will incur resulting from the injection.  The side effects of botulism toxin use for chronic migraine may include:   -Transient, and usually mild, facial weakness with facial injections  -Transient, and usually mild, head or neck weakness with head/neck  injections  -Reduction or loss of forehead facial animation due to forehead muscle weakness  -Eyelid drooping  -Dry eye  -Pain at the site of injection or bruising at the site of injection  -Double vision  -Potential unknown long term risks  Contraindications: You should not have Botox if you are pregnant, nursing, allergic to albumin, have an infection, skin condition, or muscle weakness at the site of the injection, or have myasthenia gravis, Lambert-Eaton syndrome, or ALS.  It is also possible that as with any injection, there may be an allergic reaction or no effect from the medication. Reduced effectiveness after repeated injections is sometimes seen and rarely infection at the injection site may occur. All care will be taken to prevent these side effects. If therapy is given over a long time, atrophy and wasting in the muscle injected may occur. Occasionally the patient's become refractory to treatment because they develop antibodies to the toxin. In this event, therapy needs to be modified.  I have read the above information and consent to the administration of botulism toxin.    BOTOX PROCEDURE NOTE FOR MIGRAINE HEADACHE    Contraindications and precautions discussed with patient(above). Aseptic procedure was observed and patient tolerated procedure. Procedure performed by Dr. Georgia Dom  The condition has existed for more than 6 months, and pt does not have a diagnosis of ALS, Myasthenia Gravis or Lambert-Eaton Syndrome.  Risks and benefits of injections discussed and pt agrees to proceed with the procedure.  Written consent obtained  These injections are medically necessary. Pt  receives good benefits  from these injections. These injections do not cause sedations or hallucinations which the oral therapies may cause.  Indication/Diagnosis: chronic migraine BOTOX(J0585) injection was performed according to protocol by [employer]. 200 units of BOTOX was dissolved into 4 cc NS.   NDC:  [address]-22   Description of procedure:  The patient was placed in a sitting position. The standard protocol was used for Botox as follows, with 5 units of Botox injected at each site:   -Procerus muscle, midline injection 2.5 units   -Corrugator muscle, bilateral injection 2.5 units each  -Frontalis muscle, bilateral injection, with 2 sites each side, medial injection was performed in the upper one third of the frontalis muscle, in the region vertical from the medial inferior edge of the superior orbital rim. The lateral injection was again in the upper one third of the forehead vertically above the lateral limbus of the cornea, 1.5 cm lateral to the medial injection site.  - Levator Scapulae: 5 units bilaterally   -Temporalis muscle injection, 5 sites, bilaterally. The first injection was 3 cm above the tragus of the ear, second injection site was 1.5 cm to 3 cm up from the first injection site in line with the tragus of the ear. The third injection site was 1.5-3 cm forward between the first 2 injection sites. The fourth injection site was 1.5 cm posterior to the second injection site. 5th site laterally in the temporalis at the level of the outer canthus.  - Patient feels her clenching is a trigger for headaches. +10 units masseter bilaterally and +5 units bilater lateral pterygoids.  -Occipitalis muscle injection, 3 sites, bilaterally. The first injection was done one half way between the occipital protuberance and the tip of the mastoid process behind the ear. The second injection site was done lateral and superior to the first, 1 fingerbreadth from the first injection. The third injection site was 1 fingerbreadth superiorly and medially from the first injection site.  -Cervical paraspinal muscle injection, 2 sites, bilateral knee first injection site was 1 cm from the midline of the cervical spine, 3 cm inferior to the lower border of the occipital protuberance. The second injection  site was 1.5 cm superiorly and laterally to the first injection site.  -Trapezius muscle injection was performed at 3 sites, bilaterally. The first injection site was in the upper trapezius muscle halfway between the inflection point of the neck, and the acromion. The second injection site was one half way between the acromion and the first injection site. The third injection was done between the first injection site and the inflection point of the neck.   Will return for repeat injection in 3 months.   198 unit sof Botox was used, 198 units were injected, the rest of the Botox was wasted. The patient tolerated the procedure well, there were no complications of the above procedure.

## 2017-10-26 ENCOUNTER — Ambulatory Visit: Payer: BLUE CROSS/BLUE SHIELD | Admitting: Physical Therapy

## 2017-10-26 ENCOUNTER — Telehealth: Payer: Self-pay | Admitting: *Deleted

## 2017-10-26 ENCOUNTER — Encounter: Payer: Self-pay | Admitting: Physical Therapy

## 2017-10-26 DIAGNOSIS — J3089 Other allergic rhinitis: Secondary | ICD-10-CM | POA: Diagnosis not present

## 2017-10-26 DIAGNOSIS — M542 Cervicalgia: Secondary | ICD-10-CM | POA: Diagnosis not present

## 2017-10-26 DIAGNOSIS — G44221 Chronic tension-type headache, intractable: Secondary | ICD-10-CM

## 2017-10-26 DIAGNOSIS — J301 Allergic rhinitis due to pollen: Secondary | ICD-10-CM | POA: Diagnosis not present

## 2017-10-26 DIAGNOSIS — J3081 Allergic rhinitis due to animal (cat) (dog) hair and dander: Secondary | ICD-10-CM | POA: Diagnosis not present

## 2017-10-26 NOTE — Telephone Encounter (Signed)
Spoke with patient. She is aware that the platinum pass copay card she received should still allow her to get the [facility] for free even though insurance denied. She was advised to make sure card is activated and then take it to the pharmacy. She verbalized appreciation. She will call back if she is unable to get the medication.  Denial received for Zembrace d/t patient needing to have tried another generic triptan such as naratriptan and sumatriptan first. Two alternative medications were needed. Pt tried one, generic rizatriptan.   If we should choose to appeal this decision we can fax to [facility] Dept Level 1 @ [phone].

## 2017-10-26 NOTE — Telephone Encounter (Signed)
Wanda @ [facility] has called to inform that the [employer] has been denied due to alternative medications not being tried yet.  Mariann Laster stated this information has also been faxed over.  Their contact # is [phone]  option 3 then option 1

## 2017-10-26 NOTE — Therapy (Signed)
[place], [place], [address] Phone: [phone]   Fax:  [phone]  Physical Therapy Treatment  Patient Details  Name: Margaret Lopez MRN: [id number] Date of Birth: 10/06/1970 Referring Provider (PT): Jaynee Eagles   Encounter Date: 10/26/2017  PT End of Session - 10/26/17 1109    Visit Number  15    Date for PT Re-Evaluation  11/11/17    PT Start Time  1024    PT Stop Time  1115    PT Time Calculation (Margaret)  51 Margaret    Activity Tolerance  Patient tolerated treatment well    Behavior During Therapy  [facility] for tasks assessed/performed       Past Medical History:  Diagnosis Date  . Anxiety   . Bipolar disorder ([place])   . Depression   . Deviated nasal septum 02/2011  . Generalized anxiety disorder 04/16/2013  . GERD (gastroesophageal reflux disease)   . Headache(784.0)    migraines  . Insomnia   . Nasal turbinate hypertrophy 02/2011   bilat.  . Neuromuscular disorder [facility])    Dr. Jerilynn Mages. Domingo Cocking- does injections around nerve in her head for prevention of migraines - q 3 months   . OSA (obstructive sleep apnea)   . Pneumonia    hx of   . PONV (postoperative nausea and vomiting)   . Seasonal allergies   . Sleep apnea    uses BIPAP nightly; no longer using bipap, cleared from sleep apnea after bypass surgery    Past Surgical History:  Procedure Laterality Date  . ABDOMINAL HYSTERECTOMY    . CARPAL TUNNEL RELEASE Right   . DIAGNOSTIC LAPAROSCOPY    . FOOT SURGERY Right    bone removed from great toe   . GASTRIC ROUX-EN-Y N/A 05/16/2016   Procedure: LAPAROSCOPIC ROUX-EN-Y GASTRIC BYPASS WITH UPPER ENDOSCOPY;  Surgeon: Clovis Riley, MD;  [facility];  Service: General;  Laterality: N/A;  . LAPAROSCOPIC VAGINAL HYSTERECTOMY  03/14/2007  . LIPOMA EXCISION Left 08/26/2016   Procedure: EXCISION LIPOMA FROM LEFT UPPER POSTERIOR THIGH;  Surgeon: Clovis Riley, MD;  Location: [place];  Service:  General;  Laterality: Left;  . MYRINGOTOMY WITH TUBE PLACEMENT Bilateral 03/02/2015   Procedure: MYRINGOTOMY WITH T-TUBE PLACEMENT;  Surgeon: Leta Baptist, MD;  Location: [place];  Service: ENT;  Laterality: Bilateral;  . NASAL SEPTOPLASTY W/ TURBINOPLASTY  02/22/2011   Procedure: NASAL SEPTOPLASTY WITH TURBINATE REDUCTION;  Surgeon: Ascencion Dike, MD;  Location: [place];  Service: ENT;  Laterality: Bilateral;  . TUBAL LIGATION  12/16/2004  . TYMPANOSTOMY TUBE PLACEMENT Bilateral 07/21/2017    There were no vitals filed for this visit.  Subjective Assessment - 10/26/17 1025    Subjective  Pt stated that she is feeling good today. Got Botox injections in head, neck, shoulders, and mouth    Currently in Pain?  Yes    Pain Score  3     Pain Location  --   head and neck                      [facility] Adult PT Treatment/Exercise - 10/26/17 0001      Neck Exercises: Machines for Strengthening   Nustep  L5 x6 Margaret       Modalities   Modalities  Electrical Stimulation;Moist Heat;Ultrasound      Moist Heat Therapy   Number Minutes Moist Heat  15  Minutes    Moist Heat Location  Cervical      Electrical Stimulation   Electrical Stimulation Location  rhomboids and cervical area    Electrical Stimulation Action  IFC    Electrical Stimulation Parameters  supine    Electrical Stimulation Goals  Pain      Ultrasound   Ultrasound Location  upper R trap    Ultrasound Parameters  [place] 1.1w/cm2    Ultrasound Goals  Pain      Manual Therapy   Manual Therapy  Soft tissue mobilization;Passive ROM;Manual Traction;Neural Stretch    Passive ROM  cervical spine full AROM    Manual Traction  cervical spine 5x10'' some with rotation               PT Short Term Goals - 08/21/17 1648      PT SHORT TERM GOAL #1   Title  independent with initial HEP    Status  Achieved        PT Long Term Goals - 10/11/17 1729      PT LONG TERM GOAL #3   Title   report HA intensity decreased 25%    Status  Partially Met      PT LONG TERM GOAL #4   Title  independent with advanced HEP    Status  Partially Met            Plan - 10/26/17 1111    Clinical Impression Statement  Pt reports that she has not had another migraine, positive response to today's treatment interventions. Knot felt in the middle of R scapula.    Rehab Potential  Good    PT Frequency  2x / week    PT Duration  8 weeks    PT Treatment/Interventions  ADLs/Self Care Home Management;Cryotherapy;Electrical Stimulation;Moist Heat;Traction;Ultrasound;Patient/family education;Manual techniques;Dry needling    PT Next Visit Plan  see how the treatment did with her trigger points       Patient will benefit from skilled therapeutic intervention in order to improve the following deficits and impairments:  Decreased range of motion, Increased fascial restricitons, Increased muscle spasms, Pain, Improper body mechanics, Postural dysfunction  Visit Diagnosis: Chronic tension-type headache, intractable  Cervicalgia     Problem List Patient Active Problem List   Diagnosis Date Noted  . Chronic migraine without aura, with intractable migraine, so stated, with status migrainosus 06/18/2017  . GAD (generalized anxiety disorder) 04/16/2013  . Bipolar 1 disorder, depressed ([place]) 04/16/2013    Scot Jun 10/26/2017, 11:18 AM  [place], [place], [address] Phone: [phone]   Fax:  [phone]  Name: Margaret Lopez MRN: [id number] Date of Birth: [birth year]

## 2017-10-26 NOTE — Telephone Encounter (Signed)
Zembrace Symtouch 3 mg PA completed on Cover My Meds. KEY: [id number]. Spoke with patient on phone who stated she has taken Imitrex prior and also Rizatriptan. Anticipate determination within 3 business days.  "If [employer] [place] has not responded in 3 business days or if you have any questions about your submission, contact [place] at [phone]."

## 2017-10-30 ENCOUNTER — Ambulatory Visit: Payer: BLUE CROSS/BLUE SHIELD | Admitting: Physical Therapy

## 2017-10-30 ENCOUNTER — Encounter: Payer: Self-pay | Admitting: Physical Therapy

## 2017-10-30 DIAGNOSIS — M542 Cervicalgia: Secondary | ICD-10-CM

## 2017-10-30 DIAGNOSIS — G44221 Chronic tension-type headache, intractable: Secondary | ICD-10-CM | POA: Diagnosis not present

## 2017-10-30 NOTE — Therapy (Signed)
[address] [place], [place], [address] Phone: [phone]   Fax:  [phone]  Physical Therapy Treatment  Patient Details  Name: Margaret Lopez MRN: [id number] Date of Birth: [birth year] Referring Provider (PT): Jaynee Eagles   Encounter Date: 10/30/2017  PT End of Session - 10/30/17 1732    Visit Number  16    Date for PT Re-Evaluation  11/11/17    PT Start Time  1700    PT Stop Time  1750    PT Time Calculation (min)  50 min    Activity Tolerance  Patient tolerated treatment well    Behavior During Therapy  [facility] for tasks assessed/performed       Past Medical History:  Diagnosis Date  . Anxiety   . Bipolar disorder ([place])   . Depression   . Deviated nasal septum 02/2011  . Generalized anxiety disorder 04/16/2013  . GERD (gastroesophageal reflux disease)   . Headache(784.0)    migraines  . Insomnia   . Nasal turbinate hypertrophy 02/2011   bilat.  . Neuromuscular disorder [facility])    Dr. Jerilynn Mages. Domingo Cocking- does injections around nerve in her head for prevention of migraines - q 3 months   . OSA (obstructive sleep apnea)   . Pneumonia    hx of   . PONV (postoperative nausea and vomiting)   . Seasonal allergies   . Sleep apnea    uses BIPAP nightly; no longer using bipap, cleared from sleep apnea after bypass surgery    Past Surgical History:  Procedure Laterality Date  . ABDOMINAL HYSTERECTOMY    . CARPAL TUNNEL RELEASE Right   . DIAGNOSTIC LAPAROSCOPY    . FOOT SURGERY Right    bone removed from great toe   . GASTRIC ROUX-EN-Y N/A 05/16/2016   Procedure: LAPAROSCOPIC ROUX-EN-Y GASTRIC BYPASS WITH UPPER ENDOSCOPY;  Surgeon: Clovis Riley, MD;  Location: [facility];  Service: General;  Laterality: N/A;  . LAPAROSCOPIC VAGINAL HYSTERECTOMY  03/14/2007  . LIPOMA EXCISION Left 08/26/2016   Procedure: EXCISION LIPOMA FROM LEFT UPPER POSTERIOR THIGH;  Surgeon: Clovis Riley, MD;  Location: [place];  Service:  General;  Laterality: Left;  . MYRINGOTOMY WITH TUBE PLACEMENT Bilateral 03/02/2015   Procedure: MYRINGOTOMY WITH T-TUBE PLACEMENT;  Surgeon: Leta Baptist, MD;  Location: [place];  Service: ENT;  Laterality: Bilateral;  . NASAL SEPTOPLASTY W/ TURBINOPLASTY  02/22/2011   Procedure: NASAL SEPTOPLASTY WITH TURBINATE REDUCTION;  Surgeon: Ascencion Dike, MD;  Location: [place];  Service: ENT;  Laterality: Bilateral;  . TUBAL LIGATION  12/16/2004  . TYMPANOSTOMY TUBE PLACEMENT Bilateral 07/21/2017    There were no vitals filed for this visit.  Subjective Assessment - 10/30/17 1703    Subjective  Reports that she has been feeling better.  Reports that she went away over the weekend and having to carry some of her bags has caused some increased soreness and tightness in the neck the upper traps and the rhomboids    Currently in Pain?  Yes    Pain Score  5     Pain Location  Neck    Pain Descriptors / Indicators  Spasm;Tightness    Aggravating Factors   carrying things    Pain Relieving Factors  the treatments help some                       [facility] Adult PT Treatment/Exercise - 10/30/17  0001      Neck Exercises: Machines for Strengthening   Nustep  L5 x6 min       Neck Exercises: Theraband   Shoulder Extension  20 reps;Red    Rows  20 reps;Green      Neck Exercises: Standing   Other Standing Exercises  W backs, weighted ball overhead lifts      Modalities   Modalities  Electrical Stimulation;Moist Heat;Ultrasound      Moist Heat Therapy   Number Minutes Moist Heat  15 Minutes    Moist Heat Location  Cervical      Electrical Stimulation   Electrical Stimulation Location  rhomboids and cervical area    Electrical Stimulation Action  IFC    Electrical Stimulation Parameters  supine    Electrical Stimulation Goals  Pain      Ultrasound   Ultrasound Location  upper trap and rhomboid    Ultrasound Parameters  combo    Ultrasound Goals  Pain       Manual Therapy   Manual Therapy  Soft tissue mobilization;Passive ROM;Manual Traction;Neural Stretch    Soft tissue mobilization  rhomboids, upper traps and the cervical parapsinals               PT Short Term Goals - 08/21/17 1648      PT SHORT TERM GOAL #1   Title  independent with initial HEP    Status  Achieved        PT Long Term Goals - 10/30/17 1735      PT LONG TERM GOAL #2   Title  report HA frequency decreased 25%    Status  Partially Met      PT LONG TERM GOAL #3   Title  report HA intensity decreased 25%    Status  Partially Met      PT LONG TERM GOAL #4   Title  independent with advanced HEP    Status  Partially Met            Plan - 10/30/17 1733    Clinical Impression Statement  Patient reports that she was away over the weekend and lifting and carrying her bags has caused some increase of pain and tightness, she does have significant knots in the bilateral upper traps that are very tender today.    PT Next Visit Plan  may need to try the DN again    Consulted and Agree with Plan of Care  Patient       Patient will benefit from skilled therapeutic intervention in order to improve the following deficits and impairments:  Decreased range of motion, Increased fascial restricitons, Increased muscle spasms, Pain, Improper body mechanics, Postural dysfunction  Visit Diagnosis: Chronic tension-type headache, intractable  Cervicalgia     Problem List Patient Active Problem List   Diagnosis Date Noted  . Chronic migraine without aura, with intractable migraine, so stated, with status migrainosus 06/18/2017  . GAD (generalized anxiety disorder) 04/16/2013  . Bipolar 1 disorder, depressed ([place]) 04/16/2013    Sumner Boast., PT 10/30/2017, 5:36 PM  [address] [place], [place], [address] Phone: [phone]   Fax:  [phone]  Name: LINEA CALLES MRN:  [id number] Date of Birth: 07-07-1970

## 2017-11-02 ENCOUNTER — Ambulatory Visit: Payer: BLUE CROSS/BLUE SHIELD | Admitting: Physical Therapy

## 2017-11-03 DIAGNOSIS — J3089 Other allergic rhinitis: Secondary | ICD-10-CM | POA: Diagnosis not present

## 2017-11-03 DIAGNOSIS — J3081 Allergic rhinitis due to animal (cat) (dog) hair and dander: Secondary | ICD-10-CM | POA: Diagnosis not present

## 2017-11-03 DIAGNOSIS — J301 Allergic rhinitis due to pollen: Secondary | ICD-10-CM | POA: Diagnosis not present

## 2017-11-06 DIAGNOSIS — J301 Allergic rhinitis due to pollen: Secondary | ICD-10-CM | POA: Diagnosis not present

## 2017-11-06 DIAGNOSIS — J3089 Other allergic rhinitis: Secondary | ICD-10-CM | POA: Diagnosis not present

## 2017-11-06 DIAGNOSIS — J3081 Allergic rhinitis due to animal (cat) (dog) hair and dander: Secondary | ICD-10-CM | POA: Diagnosis not present

## 2017-11-09 ENCOUNTER — Ambulatory Visit: Payer: BLUE CROSS/BLUE SHIELD | Admitting: Physical Therapy

## 2017-11-13 DIAGNOSIS — J3089 Other allergic rhinitis: Secondary | ICD-10-CM | POA: Diagnosis not present

## 2017-11-13 DIAGNOSIS — J301 Allergic rhinitis due to pollen: Secondary | ICD-10-CM | POA: Diagnosis not present

## 2017-11-13 DIAGNOSIS — J3081 Allergic rhinitis due to animal (cat) (dog) hair and dander: Secondary | ICD-10-CM | POA: Diagnosis not present

## 2017-11-20 ENCOUNTER — Telehealth: Payer: Self-pay | Admitting: Neurology

## 2017-11-20 NOTE — Telephone Encounter (Signed)
Completed PA on Cover My Meds. KEY: [id number]. Anticipate determination within 3 days.   If [employer] has not responded in 3 business days or if you have any questions about your submission, contact [place] at [phone].

## 2017-11-20 NOTE — Telephone Encounter (Signed)
Katie with Cover my meds requesting a call at [phone] to discuss PA for Erenumab-aooe (AIMOVIG) 140 MG/ML SOAJ

## 2017-11-21 NOTE — Telephone Encounter (Addendum)
Aimovig has been denied due to patient also being on Botox. Reference # [id number]  "Aimovig will not be approved when it is taken with botulinum toxin or when the member has received botulinum toxin within the last 3 months"  Patient has savings card to continue getting this for one year starting 08/17/17.   If we should choose to appeal this decision, fax to [facility] Dept Level 1, fax # [phone] or write to the department at [place], [facility] [address].

## 2017-11-28 DIAGNOSIS — J209 Acute bronchitis, unspecified: Secondary | ICD-10-CM | POA: Diagnosis not present

## 2017-11-28 DIAGNOSIS — J320 Chronic maxillary sinusitis: Secondary | ICD-10-CM | POA: Diagnosis not present

## 2017-11-30 ENCOUNTER — Ambulatory Visit (INDEPENDENT_AMBULATORY_CARE_PROVIDER_SITE_OTHER): Payer: BLUE CROSS/BLUE SHIELD | Admitting: Psychiatry

## 2017-11-30 ENCOUNTER — Encounter (HOSPITAL_COMMUNITY): Payer: Self-pay | Admitting: Psychiatry

## 2017-11-30 DIAGNOSIS — F411 Generalized anxiety disorder: Secondary | ICD-10-CM

## 2017-11-30 DIAGNOSIS — F319 Bipolar disorder, unspecified: Secondary | ICD-10-CM

## 2017-11-30 MED ORDER — DIVALPROEX SODIUM ER 500 MG PO TB24: 2000.0000 mg | ORAL_TABLET | Freq: Every day | ORAL | 2 refills | Status: DC

## 2017-11-30 MED ORDER — LITHIUM CARBONATE ER 300 MG PO TBCR: 600.0000 mg | EXTENDED_RELEASE_TABLET | Freq: Every evening | ORAL | 2 refills | Status: DC

## 2017-11-30 MED ORDER — ALPRAZOLAM 0.5 MG PO TABS: 0.5000 mg | ORAL_TABLET | Freq: Two times a day (BID) | ORAL | 2 refills | Status: DC | PRN

## 2017-11-30 NOTE — Progress Notes (Signed)
[facility] MD/PA/NP OP Progress Note  11/30/2017 10:37 AM Margaret Lopez  MRN:  [id number]  Chief Complaint:  Chief Complaint    Follow-up; Medication Refill     HPI: Patient reports that she is in general feeling better.  Her anxiety has decreased and she feels less irritable.  She is denying any overt manic or hypomanic-like symptoms.  Her depression seems to be well controlled.  Sleep is fair.  She is denying any suicidal or homicidal ideations.  Due to the structure and volume of her stomach since the gastric bypass she is not able to take in large quantities of food or medications.  She is taking 1 Depakote and all of her other medications at 6:30 PM.  After that she will take the other 3 tablets of her Depakote at 930 before going to bed because it makes her very sleepy.  At times she thinks about changing her medication from Depakote to something else but reports good effect so wants to continue.  She was not able to tolerate or use the Depakote liquid due to the volume or the Depakote sprinkles due to the number of tablets.  Visit Diagnosis:    ICD-10-CM   1. Generalized anxiety disorder F41.1 ALPRAZolam (XANAX) 0.5 MG tablet  2. Bipolar 1 disorder, depressed ([facility]) F31.9 ALPRAZolam (XANAX) 0.5 MG tablet    divalproex (DEPAKOTE ER) 500 MG 24 hr tablet    lithium carbonate (LITHOBID) 300 MG CR tablet      Past Psychiatric History:  Anxiety:Yes Bipolar Disorder:Yes Depression:Yes Mania:Yes Psychosis:No Schizophrenia:No Personality Disorder:No Hospitalization for psychiatric illness:No History of Electroconvulsive Shock Therapy:No Prior Suicide Attempts:No    Past Medical History:  Past Medical History:  Diagnosis Date  . Anxiety   . Bipolar disorder ([place])   . Depression   . Deviated nasal septum 02/2011  . Generalized anxiety disorder 04/16/2013  . GERD (gastroesophageal reflux disease)   . Headache(784.0)    migraines  . Insomnia   . Nasal turbinate hypertrophy  02/2011   bilat.  . Neuromuscular disorder [facility])    Dr. Jerilynn Mages. Domingo Cocking- does injections around nerve in her head for prevention of migraines - q 3 months   . OSA (obstructive sleep apnea)   . Pneumonia    hx of   . PONV (postoperative nausea and vomiting)   . Seasonal allergies   . Sleep apnea    uses BIPAP nightly; no longer using bipap, cleared from sleep apnea after bypass surgery    Past Surgical History:  Procedure Laterality Date  . ABDOMINAL HYSTERECTOMY    . CARPAL TUNNEL RELEASE Right   . DIAGNOSTIC LAPAROSCOPY    . FOOT SURGERY Right    bone removed from great toe   . GASTRIC ROUX-EN-Y N/A 05/16/2016   Procedure: LAPAROSCOPIC ROUX-EN-Y GASTRIC BYPASS WITH UPPER ENDOSCOPY;  Surgeon: Clovis Riley, MD;  [facility];  Service: General;  Laterality: N/A;  . LAPAROSCOPIC VAGINAL HYSTERECTOMY  03/14/2007  . LIPOMA EXCISION Left 08/26/2016   Procedure: EXCISION LIPOMA FROM LEFT UPPER POSTERIOR THIGH;  Surgeon: Clovis Riley, MD;  Location: [place];  Service: General;  Laterality: Left;  . MYRINGOTOMY WITH TUBE PLACEMENT Bilateral 03/02/2015   Procedure: MYRINGOTOMY WITH T-TUBE PLACEMENT;  Surgeon: Leta Baptist, MD;  Location: [place];  Service: ENT;  Laterality: Bilateral;  . NASAL SEPTOPLASTY W/ TURBINOPLASTY  02/22/2011   Procedure: NASAL SEPTOPLASTY WITH TURBINATE REDUCTION;  Surgeon: Ascencion Dike, MD;  Location: [place];  Service: ENT;  Laterality: Bilateral;  . TUBAL LIGATION  12/16/2004  . TYMPANOSTOMY TUBE PLACEMENT Bilateral 07/21/2017    Family Psychiatric History:  Family History  Problem Relation Age of Onset  . ADD / ADHD Sister   . Hypertension Mother   . Sarcoidosis Mother   . Rheum arthritis Mother   . Hypertension Father   . Heart Problems Father   . Alcohol abuse Maternal Grandmother   . Bipolar disorder Maternal Grandmother   . Breast cancer Maternal Grandmother   . Breast cancer Maternal Aunt   . Heart Problems Other         dad's side of family  . Suicidality Neg Hx     Social History:  Social History   Socioeconomic History  . Marital status: Legally Separated    Spouse name: Not on file  . Number of children: 2  . Years of education: Not on file  . Highest education level: Some college, no degree  Occupational History  . Not on file  Social Needs  . Financial resource strain: Not on file  . Food insecurity:    Worry: Not on file    Inability: Not on file  . Transportation needs:    Medical: Not on file    Non-medical: Not on file  Tobacco Use  . Smoking status: Former Smoker    Packs/day: 0.50    Years: 4.00    Pack years: 2.00    Types: Cigarettes    Last attempt to quit: 11/12/2015    Years since quitting: 2.0  . Smokeless tobacco: Never Used  Substance and Sexual Activity  . Alcohol use: No    Alcohol/week: 0.0 standard drinks    Comment: once a month or less  . Drug use: No  . Sexual activity: Yes    Partners: Male    Birth control/protection: Surgical    Comment: Hysterectomy  Lifestyle  . Physical activity:    Days per week: 5 days    Minutes per session: 40 min  . Stress: Only a little  Relationships  . Social connections:    Talks on phone: Not on file    Gets together: Not on file    Attends religious service: Not on file    Active member of club or organization: Not on file    Attends meetings of clubs or organizations: Not on file    Relationship status: Not on file  Other Topics Concern  . Not on file  Social History Narrative   Lives at home with boyfriend   Right handed   Caffeine: 2 cups daily    Allergies:  Allergies  Allergen Reactions  . Doxazosin Nausea And Vomiting  . Morphine And Related Itching  . Phenergan [Promethazine Hcl]     " knocks out for several hours"  . Aloe Hives  . Penicillins Rash    Has patient had a PCN reaction causing immediate rash, facial/tongue/throat swelling, SOB or lightheadedness with hypotension: No Has patient had  a PCN reaction causing severe rash involving mucus membranes or skin necrosis: No Has patient had a PCN reaction that required hospitalization No Has patient had a PCN reaction occurring within the last 10 years: No If all of the above answers are "NO", then may proceed with Cephalosporin use.   . Sulfa Antibiotics Rash    Metabolic Disorder Labs: No results found for: HGBA1C, MPG No results found for: PROLACTIN No results found for: CHOL, TRIG, HDL, CHOLHDL, VLDL, LDLCALC No results  found for: TSH  Therapeutic Level Labs: No results found for: LITHIUM No results found for: VALPROATE No components found for:  CBMZ  Current Medications: Current Outpatient Medications  Medication Sig Dispense Refill  . acetaminophen (TYLENOL) 500 MG tablet Take 1,000 mg by mouth every 6 (six) hours as needed (for pain/headaches).     [name] albuterol (PROVENTIL HFA;VENTOLIN HFA) 108 (90 Base) MCG/ACT inhaler Inhale 1-2 puffs into the lungs every 6 (six) hours as needed for wheezing or shortness of breath.    . ALPRAZolam (XANAX) 0.5 MG tablet Take 1 tablet (0.5 mg total) by mouth 2 (two) times daily as needed for anxiety. 60 tablet 2  . cyclobenzaprine (FLEXERIL) 10 MG tablet Take 5-10 mg by mouth at bedtime as needed. TMJ  2  . Diclofenac Potassium (CAMBIA) 50 MG PACK Mix 1 pack (50 mg) with 1-2 oz of water and drink. May repeat in 12 hours if needed. Max 2 packs in 24 hours and max 9 packs per month. 9 each 3  . divalproex (DEPAKOTE ER) 500 MG 24 hr tablet Take 4 tablets (2,000 mg total) by mouth daily. 120 tablet 2  . EPINEPHrine 0.3 mg/0.3 mL IJ SOAJ injection Inject 0.3 mg into the muscle daily as needed (for anaphylatic reactions.).   1  . Erenumab-aooe (AIMOVIG) 140 MG/ML SOAJ Inject 140 mg into the skin every 30 (thirty) days. 1 pen 11  . hydrOXYzine (ATARAX/VISTARIL) 25 MG tablet Take 25 mg by mouth at bedtime as needed for itching.   3  . ipratropium (ATROVENT) 0.06 % nasal spray Place 2 sprays into  both nostrils daily as needed. Allergies, runny nose.  4  . levocetirizine (XYZAL) 5 MG tablet Take 5 mg by mouth at bedtime.     [name] lithium carbonate (LITHOBID) 300 MG CR tablet Take 2 tablets (600 mg total) by mouth every evening. 60 tablet 2  . Multiple Vitamins-Minerals (OPURITY BYPASS OPTIMIZED) CHEW Chew 1 tablet by mouth daily.    . ondansetron (ZOFRAN-ODT) 4 MG disintegrating tablet Take 4 mg by mouth every 6 (six) hours as needed for nausea or vomiting.   0  . prochlorperazine (COMPAZINE) 10 MG tablet Take 1 tablet (10 mg total) by mouth 3 (three) times daily as needed for nausea or vomiting (migraine). MAY CAUSE SEDATION. 30 tablet 3  . SUMAtriptan Succinate (ZEMBRACE SYMTOUCH) 3 MG/0.5ML SOAJ Inject 3 mg into the skin every 15 (fifteen) minutes. May repeat again in 2 hours Maximum 4 in one day. 12 pen 11  . valACYclovir (VALTREX) 500 MG tablet Take 500 mg by mouth See admin instructions. Takes 500mg  daily for 5 days when she has a flare up.  3  . zonisamide (ZONEGRAN) 25 MG capsule Take 50 mg by mouth at bedtime. Working up to 3 tablets at bedtime  2   No current facility-administered medications for this visit.      Musculoskeletal: Strength & Muscle Tone: within normal limits Gait & Station: normal Patient leans: N/A   Psychiatric Specialty Exam: Review of Systems  Constitutional: Negative for chills, diaphoresis and fever.  Respiratory: Positive for cough, sputum production and shortness of breath.     Blood pressure 118/68, height 5\' 3"  (1.6 m), weight 132 lb (59.9 kg).Body mass index is 23.38 kg/m.  General Appearance: Casual  Eye Contact:  Good  Speech:  Clear and Coherent and Normal Rate  Volume:  Normal  Mood:  Euthymic  Affect:  anxious  Thought Process:  Goal Directed and Descriptions of  Associations: Intact  Orientation:  Full (Time, Place, and Person)  Thought Content:  Logical  Suicidal Thoughts:  No  Homicidal Thoughts:  No  Memory:  Immediate;   Good   Judgement:  Good  Insight:  Good  Psychomotor Activity:  Normal  Concentration:  Concentration: Good  Recall:  Good  Fund of Knowledge:  Good  Language:  Good  Akathisia:  No  Handed:  Right  AIMS (if indicated):     Assets:  Communication Skills Desire for Improvement Financial Resources/Insurance Housing Intimacy Resilience Social Support Talents/Skills Transportation Vocational/Educational  ADL's:  Intact  Cognition:  WNL  Sleep:   good       Screenings: PHQ2-9     Nutrition from [facility]/12/2016 [facility] Nutrition from 05/09/2016 in [facility] Nutrition from 02/05/2016 in [facility]  PHQ-2 Total Score  0  0  0      I reviewed the information below on 11/30/2017 and have updated it Assessment and Plan: Bipolar I disorder; GAD    Medication management with supportive therapy. Risks and benefits, side effects and alternative treatment options discussed with patient. Pt was given an opportunity to ask questions about medication, illness, and treatment. All current psychiatric medications have been reviewed and discussed with the patient and adjusted as clinically appropriate. The patient has been provided an accurate and updated list of the medications being now prescribed. Patient expressed understanding of how their medications were to be used.  Pt verbalized understanding and verbal consent obtained for treatment.  The risk of un-intended pregnancy is low based on the fact that pt reports she had a hysterecomy. Pt is aware that these meds carry a teratogenic risk. Pt will discuss plan of action if she does or plans to become pregnant in the future.  Status of current problems: improved  Meds: Lithium CR 600mg  po qPM for Bipolar disorder Xanax 0.5mg  po BID prn anxiety-she is only taking once a week Depakote ER 2000mg  po qD for  Bipolar disorder- at bedtime Patient has not  been able to tolerate liquid Depakote and Depakote sprinkles.  Due to the size of the tablets she is having to break up the dose over several hours in the evening.  I recommended that patient talk with the pharmacist regarding the size of the pill and see if there are smaller size pills available   Labs: ordered Depakote level, CBC (platelets), CMP, Lithium level, TSH.  States that due to recent illness she has not been taking her full dose of Depakote.  She will come in next week and have her labs drawn   Therapy: brief supportive therapy provided. Discussed psychosocial stressors in detail.   Reviewed ways to manage anxiety and had patient make a list of her activities today and check off several things  Consultations: none  Pt denies SI and is at an acute low risk for suicide. Patient told to call clinic if any problems occur. Patient advised to go to ER if they should develop SI/HI, side effects, or if symptoms worsen. Has crisis numbers to call if needed. Pt verbalized understanding.  F/up in 3 months or sooner if needed  The duration of this appointment visit was 40minutes of face-to-face time with the patient. Greater than 50% of this time was spent in counseling, explanation of diagnosis, planning of further management, and coordination of care   Charlcie Cradle, MD 11/30/2017, 10:37 AM

## 2017-12-26 DIAGNOSIS — J309 Allergic rhinitis, unspecified: Secondary | ICD-10-CM | POA: Diagnosis not present

## 2017-12-26 DIAGNOSIS — J45901 Unspecified asthma with (acute) exacerbation: Secondary | ICD-10-CM | POA: Diagnosis not present

## 2018-01-04 DIAGNOSIS — J209 Acute bronchitis, unspecified: Secondary | ICD-10-CM | POA: Diagnosis not present

## 2018-01-05 ENCOUNTER — Other Ambulatory Visit (HOSPITAL_COMMUNITY): Payer: Self-pay

## 2018-01-05 DIAGNOSIS — F319 Bipolar disorder, unspecified: Secondary | ICD-10-CM

## 2018-01-05 DIAGNOSIS — Z5181 Encounter for therapeutic drug level monitoring: Secondary | ICD-10-CM

## 2018-01-06 LAB — COMPREHENSIVE METABOLIC PANEL
ALBUMIN: 3.8 g/dL (ref 3.5–5.5)
ALK PHOS: 45 IU/L (ref 39–117)
ALT: 9 IU/L (ref 0–32)
AST: 16 IU/L (ref 0–40)
Albumin/Globulin Ratio: 2.4 — ABNORMAL HIGH (ref 1.2–2.2)
BUN / CREAT RATIO: 17 (ref 9–23)
BUN: 10 mg/dL (ref 6–24)
Bilirubin Total: 0.2 mg/dL (ref 0.0–1.2)
CO2: 24 mmol/L (ref 20–29)
Calcium: 9.4 mg/dL (ref 8.7–10.2)
Chloride: 101 mmol/L (ref 96–106)
Creatinine, Ser: 0.58 mg/dL (ref 0.57–1.00)
GFR calc Af Amer: 129 mL/min/{1.73_m2} (ref >59)
GFR calc non Af Amer: 112 mL/min/{1.73_m2} (ref >59)
GLOBULIN, TOTAL: 1.6 g/dL (ref 1.5–4.5)
Glucose: 80 mg/dL (ref 65–99)
Potassium: 5 mmol/L (ref 3.5–5.2)
Sodium: 140 mmol/L (ref 134–144)
Total Protein: 5.4 g/dL — ABNORMAL LOW (ref 6.0–8.5)

## 2018-01-06 LAB — CBC WITH DIFFERENTIAL/PLATELET
Basophils Absolute: 0 10*3/uL (ref 0.0–0.2)
Basos: 0 %
EOS (ABSOLUTE): 0.1 10*3/uL (ref 0.0–0.4)
Eos: 1 %
Hematocrit: 34.4 % (ref 34.0–46.6)
Hemoglobin: 11.1 g/dL (ref 11.1–15.9)
IMMATURE GRANULOCYTES: 0 %
Immature Grans (Abs): 0 10*3/uL (ref 0.0–0.1)
Lymphocytes Absolute: 2.4 10*3/uL (ref 0.7–3.1)
Lymphs: 26 %
MCH: 31.3 pg (ref 26.6–33.0)
MCHC: 32.3 g/dL (ref 31.5–35.7)
MCV: 97 fL (ref 79–97)
MONOS ABS: 0.5 10*3/uL (ref 0.1–0.9)
Monocytes: 5 %
NEUTROS PCT: 68 %
Neutrophils Absolute: 6.2 10*3/uL (ref 1.4–7.0)
Platelets: 210 10*3/uL (ref 150–450)
RBC: 3.55 x10E6/uL — AB (ref 3.77–5.28)
RDW: 11.2 % — ABNORMAL LOW (ref 12.3–15.4)
WBC: 9.2 10*3/uL (ref 3.4–10.8)

## 2018-01-06 LAB — LITHIUM LEVEL: Lithium Lvl: 0.6 mmol/L (ref 0.6–1.2)

## 2018-01-06 LAB — TSH: TSH: 5.61 u[IU]/mL — AB (ref 0.450–4.500)

## 2018-01-06 LAB — VALPROIC ACID LEVEL: Valproic Acid Lvl: 82 ug/mL (ref 50–100)

## 2018-01-08 NOTE — Telephone Encounter (Signed)
Received another PA for Zembrace. Pt was already given a platinum pass card for free Zembrace for a year. PA will not be repeated at this time. Pt was previously advised to please call if she is unable to get her medication.

## 2018-01-23 ENCOUNTER — Telehealth: Payer: Self-pay | Admitting: Neurology

## 2018-01-23 ENCOUNTER — Ambulatory Visit: Payer: BLUE CROSS/BLUE SHIELD | Admitting: Neurology

## 2018-01-23 DIAGNOSIS — G43711 Chronic migraine without aura, intractable, with status migrainosus: Secondary | ICD-10-CM

## 2018-01-23 MED ORDER — FROVATRIPTAN SUCCINATE 2.5 MG PO TABS: 2.5000 mg | ORAL_TABLET | ORAL | 6 refills | Status: DC | PRN

## 2018-01-23 NOTE — Progress Notes (Signed)
Consent Form Botulism Toxin Injection For Chronic Migraine  Interval history 01/23/2018: This is her fourth injection. Baseline 15 headache days a month and 10 migraine days a month. Excellent response, she has > 70% reduction in migraines and headaches monthly. Discussed acute management prescribed frova, zembrace works quickly but the migraines rebound Frova has a long half life. If insurance problems with the Frova can try Amerge.   No orders of the defined types were placed in this encounter.  Patient felt her forehead and eyebrows were uncomfortable due to inability to move them, reduced injections 1/2 injections in the corrigators and procerus, did the frontal very high, +10 each masseter, +5 each lateral pterygoid, +5 bilat levator scapulae. +temples.  Reviewed orally with patient, additionally signature is on file:  Botulism toxin has been approved by the Federal drug administration for treatment of chronic migraine. Botulism toxin does not cure chronic migraine and it may not be effective in some patients.  The administration of botulism toxin is accomplished by injecting a small amount of toxin into the muscles of the neck and head. Dosage must be titrated for each individual. Any benefits resulting from botulism toxin tend to wear off after 3 months with a repeat injection required if benefit is to be maintained. Injections are usually done every 3-4 months with maximum effect peak achieved by about 2 or 3 weeks. Botulism toxin is expensive and you should be sure of what costs you will incur resulting from the injection.  The side effects of botulism toxin use for chronic migraine may include:   -Transient, and usually mild, facial weakness with facial injections  -Transient, and usually mild, head or neck weakness with head/neck injections  -Reduction or loss of forehead facial animation due to forehead muscle weakness  -Eyelid drooping  -Dry eye  -Pain at the site of injection  or bruising at the site of injection  -Double vision  -Potential unknown long term risks  Contraindications: You should not have Botox if you are pregnant, nursing, allergic to albumin, have an infection, skin condition, or muscle weakness at the site of the injection, or have myasthenia gravis, Lambert-Eaton syndrome, or ALS.  It is also possible that as with any injection, there may be an allergic reaction or no effect from the medication. Reduced effectiveness after repeated injections is sometimes seen and rarely infection at the injection site may occur. All care will be taken to prevent these side effects. If therapy is given over a long time, atrophy and wasting in the muscle injected may occur. Occasionally the patient's become refractory to treatment because they develop antibodies to the toxin. In this event, therapy needs to be modified.  I have read the above information and consent to the administration of botulism toxin.    BOTOX PROCEDURE NOTE FOR MIGRAINE HEADACHE    Contraindications and precautions discussed with patient(above). Aseptic procedure was observed and patient tolerated procedure. Procedure performed by Dr. Georgia Dom  The condition has existed for more than 6 months, and pt does not have a diagnosis of ALS, Myasthenia Gravis or Lambert-Eaton Syndrome.  Risks and benefits of injections discussed and pt agrees to proceed with the procedure.  Written consent obtained  These injections are medically necessary. Pt  receives good benefits from these injections. These injections do not cause sedations or hallucinations which the oral therapies may cause.  Indication/Diagnosis: chronic migraine BOTOX(J0585) injection was performed according to protocol by [employer]. 200 units of BOTOX was dissolved into 4 cc  NS.   NDC: [address]-17   Description of procedure:  The patient was placed in a sitting position. The standard protocol was used for Botox as follows, with 5  units of Botox injected at each site:   -Procerus muscle, midline injection 2.5 units   -Corrugator muscle, bilateral injection 2.5 units each  -Frontalis muscle, bilateral injection, with 2 sites each side, medial injection was performed in the upper one third of the frontalis muscle, in the region vertical from the medial inferior edge of the superior orbital rim. The lateral injection was again in the upper one third of the forehead vertically above the lateral limbus of the cornea, 1.5 cm lateral to the medial injection site.  - Levator Scapulae: 5 units bilaterally   -Temporalis muscle injection, 5 sites, bilaterally. The first injection was 3 cm above the tragus of the ear, second injection site was 1.5 cm to 3 cm up from the first injection site in line with the tragus of the ear. The third injection site was 1.5-3 cm forward between the first 2 injection sites. The fourth injection site was 1.5 cm posterior to the second injection site. 5th site laterally in the temporalis at the level of the outer canthus.  - Patient feels her clenching is a trigger for headaches. +10 units masseter bilaterally and +5 units bilater lateral pterygoids.  -Occipitalis muscle injection, 3 sites, bilaterally. The first injection was done one half way between the occipital protuberance and the tip of the mastoid process behind the ear. The second injection site was done lateral and superior to the first, 1 fingerbreadth from the first injection. The third injection site was 1 fingerbreadth superiorly and medially from the first injection site.  -Cervical paraspinal muscle injection, 2 sites, bilateral knee first injection site was 1 cm from the midline of the cervical spine, 3 cm inferior to the lower border of the occipital protuberance. The second injection site was 1.5 cm superiorly and laterally to the first injection site.  -Trapezius muscle injection was performed at 3 sites, bilaterally. The first  injection site was in the upper trapezius muscle halfway between the inflection point of the neck, and the acromion. The second injection site was one half way between the acromion and the first injection site. The third injection was done between the first injection site and the inflection point of the neck.   Will return for repeat injection in 3 months.   198 unit sof Botox was used, 198 units were injected, the rest of the Botox was wasted. The patient tolerated the procedure well, there were no complications of the above procedure.

## 2018-01-23 NOTE — Progress Notes (Signed)
Botox- 100 units x 2 vials SBB:[id number] Expiration: 06/2020 NDC: 2300-9794-99  Bacteriostatic 0.9% Sodium Chloride- 26mL total Lot: [id number] Expiration: 10/11/2018 NDC: 9068-9340-68  Dx: E03.353 B/B

## 2018-01-23 NOTE — Telephone Encounter (Signed)
12 WEEK BTX

## 2018-01-24 ENCOUNTER — Telehealth: Payer: Self-pay | Admitting: *Deleted

## 2018-01-24 NOTE — Telephone Encounter (Signed)
Frovatriptan 2.5 mg tablet PA completed on Cover My Meds. KEY: [id number].  " If [employer] [place] has not responded in 3 business days or if you have any questions about your submission, contact [place] at [phone]."

## 2018-01-25 ENCOUNTER — Encounter: Payer: Self-pay | Admitting: *Deleted

## 2018-01-25 NOTE — Telephone Encounter (Addendum)
Per Cover My Meds, Frovatriptan has been approved. Effective from 01/24/2018 through 01/22/2021.  Sent patient a message through [employer]. Called [facility] and notified Margaret Lopez of approval period. She was not able to get it to to go through ins yet but will try again later.

## 2018-01-29 NOTE — Telephone Encounter (Signed)
We received the approval letter for Frovatriptan indicating same date range as noted on 01/25/2018. Letter faxed to [facility] on [address]. Received a receipt of confirmation.

## 2018-01-30 NOTE — Telephone Encounter (Signed)
I called and scheduled the patient. DW  °

## 2018-01-31 ENCOUNTER — Other Ambulatory Visit: Payer: Self-pay | Admitting: *Deleted

## 2018-01-31 MED ORDER — NARATRIPTAN HCL 2.5 MG PO TABS: ORAL_TABLET | ORAL | 6 refills | Status: DC

## 2018-02-14 IMAGING — RF DG UGI W/ KUB
15 of 16 series · 15 of 16 positions shown · non-contrast
Comparison: None.

CLINICAL DATA: Morbid obesity. Pre-op evaluation for bariatric
surgery.

EXAM:
UPPER GI SERIES WITH KUB
TECHNIQUE: After obtaining a scout radiograph a routine upper GI series was
performed using thin barium.
FLUOROSCOPY TIME:  Fluoroscopy Time: 2 minutes 0 seconds ; low dose
pulsed fluoroscopy
Radiation Exposure Index (if provided by the fluoroscopic device):
37.9 mGy
Number of Acquired Spot Images: 0

[Series 2: t abdomen supine · 0.15mm/px · 1 of 1 slices shown]
[im 1/1]
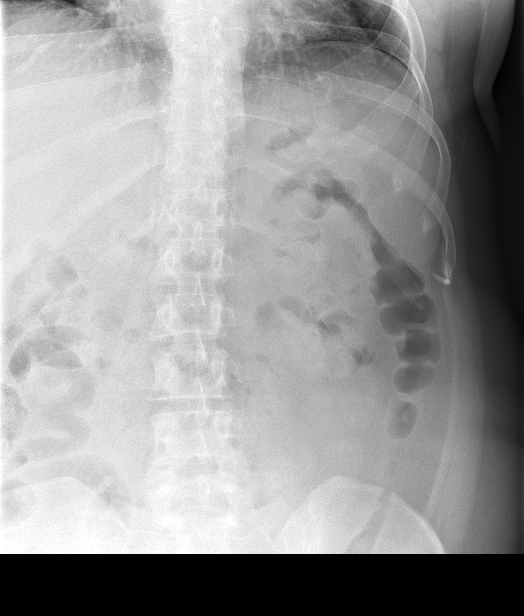

[Series 3: cp_standard · 0.28mm/px · 1 of 1 slices shown (1 of 14)]
[im 1/1]
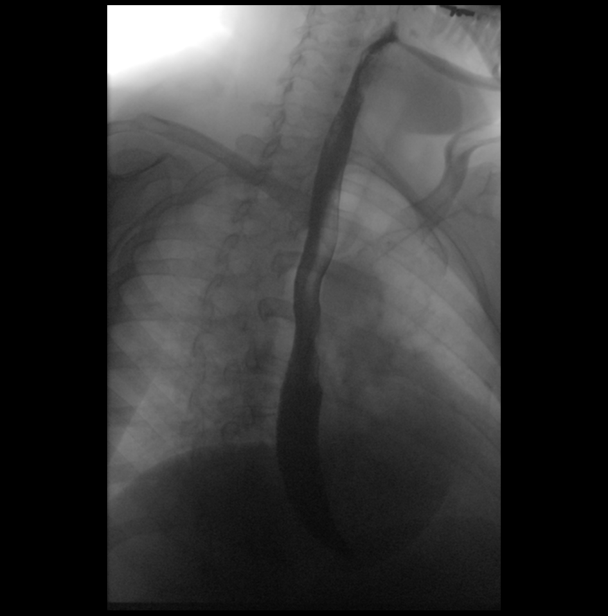

[Series 4: cp_standard · 0.28mm/px · 1 of 1 slices shown (2 of 14)]
[im 1/1]
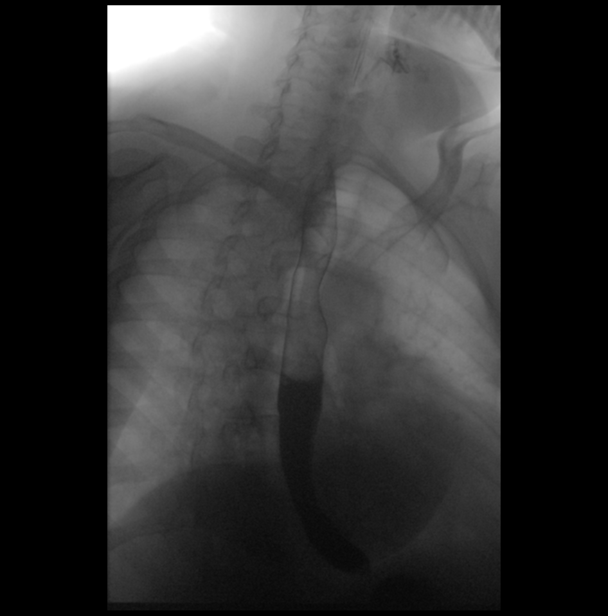

[Series 5: cp_standard · 0.28mm/px · 1 of 1 slices shown (3 of 14)]
[im 1/1]
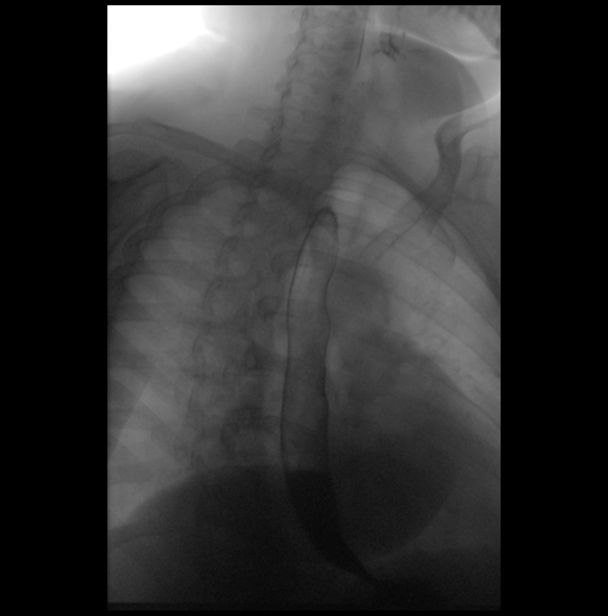

[Series 6: cp_standard · 0.19mm/px · 1 of 1 slices shown (4 of 14)]
[im 1/1]
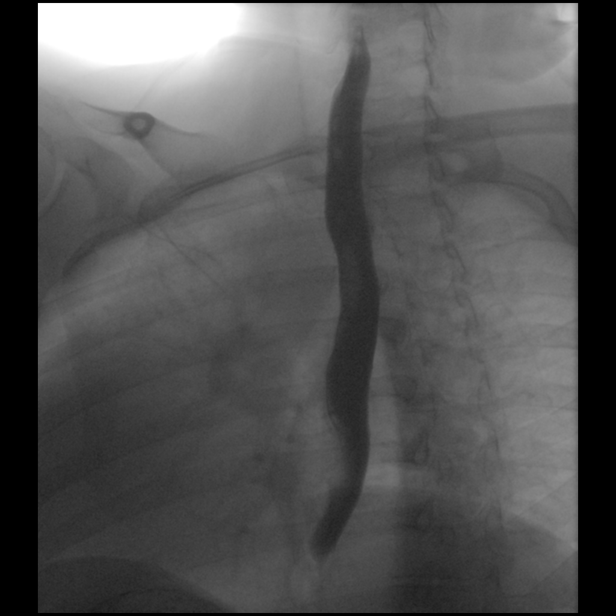

[Series 7: cp_standard · 0.19mm/px · 1 of 1 slices shown (5 of 14)]
[im 1/1]
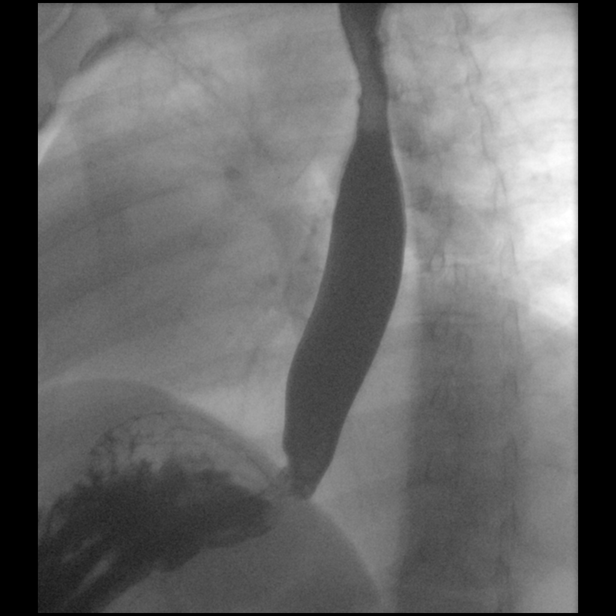

[Series 8: cp_standard · 0.19mm/px · 1 of 1 slices shown (6 of 14)]
[im 1/1]
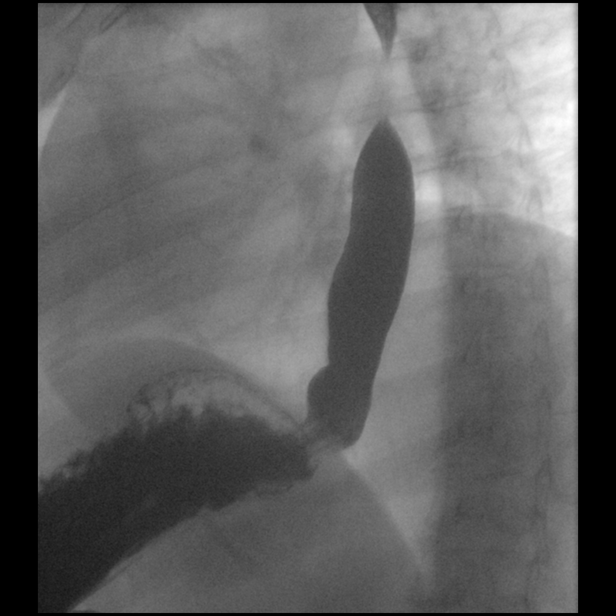

[Series 10: cp_standard · 0.19mm/px · 1 of 1 slices shown (7 of 14)]
[im 1/1]
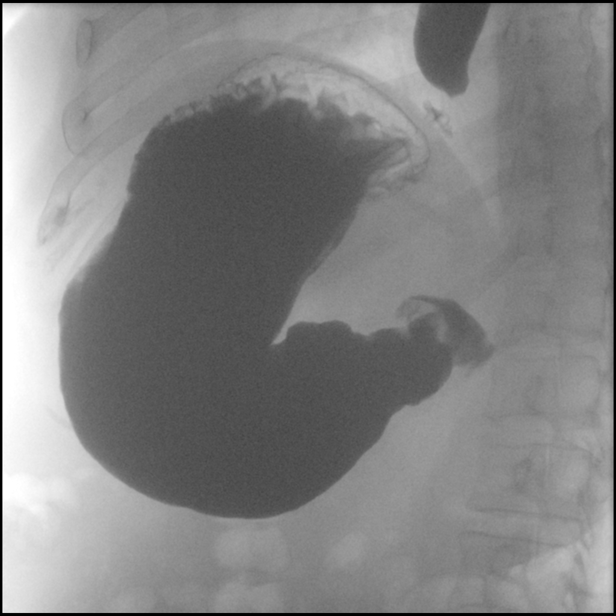

[Series 11: cp_standard · 0.19mm/px · 1 of 1 slices shown (8 of 14)]
[im 1/1]
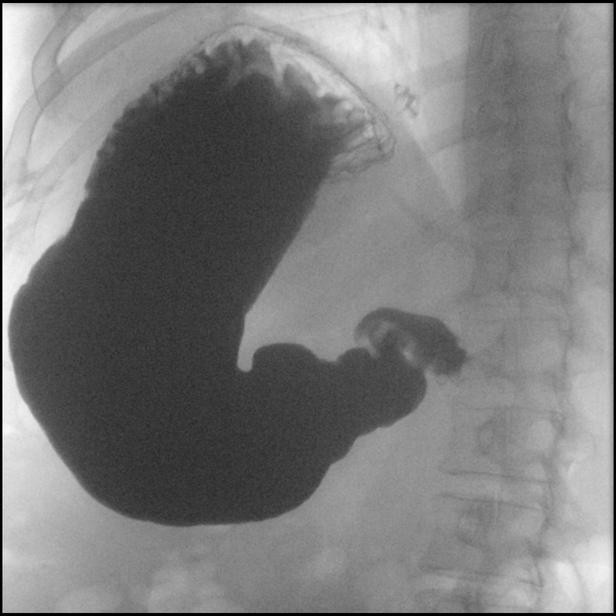

[Series 12: cp_standard · 0.19mm/px · 1 of 1 slices shown (9 of 14)]
[im 1/1]
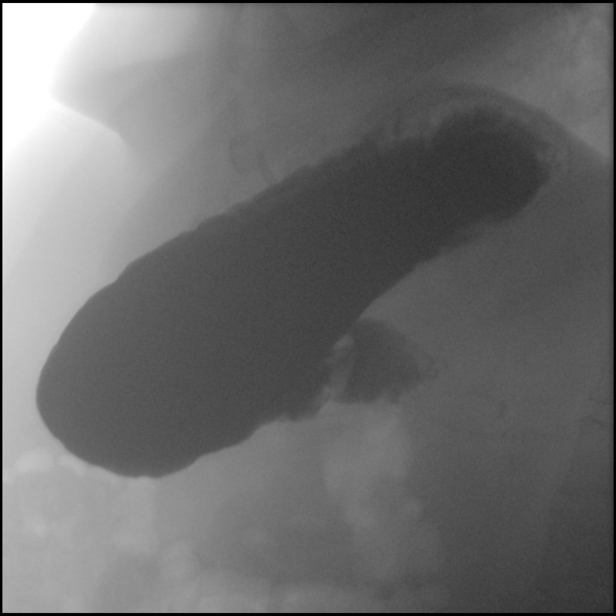

[Series 13: cp_standard · 0.19mm/px · 1 of 1 slices shown (10 of 14)]
[im 1/1]
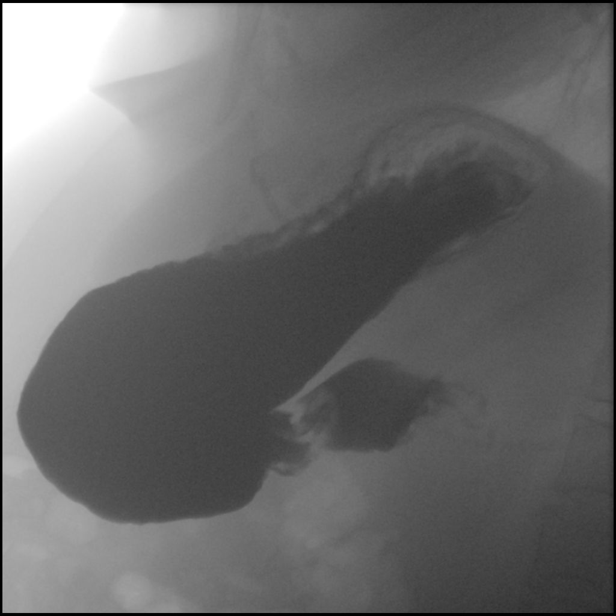

[Series 14: cp_standard · 0.19mm/px · 1 of 1 slices shown (11 of 14)]
[im 1/1]
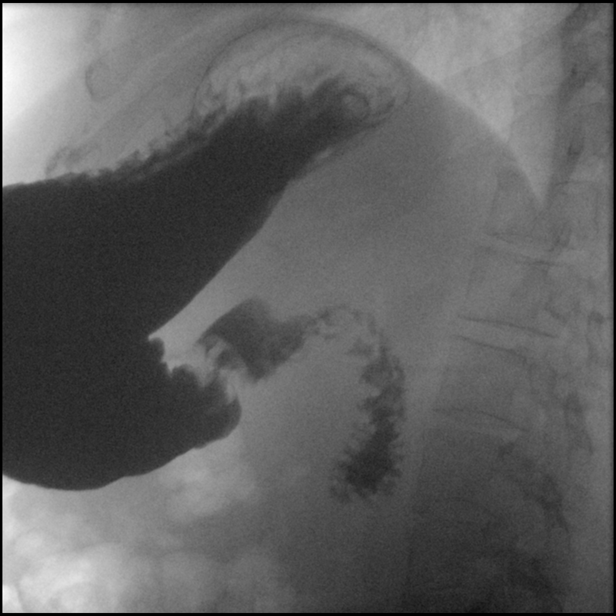

[Series 15: cp_standard · 0.29mm/px · 1 of 1 slices shown (12 of 14)]
[im 1/1]
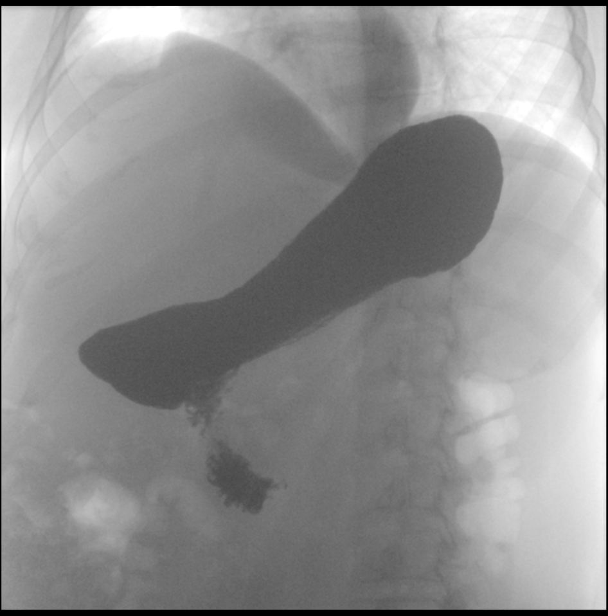

[Series 16: cp_standard · 0.19mm/px · 1 of 1 slices shown (13 of 14)]
[im 1/1]
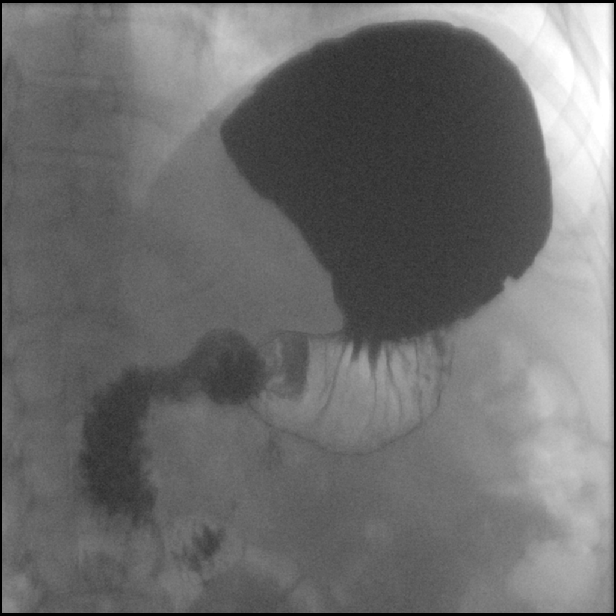

[Series 17: cp_standard · 0.19mm/px · 1 of 1 slices shown (14 of 14)]
[im 1/1]
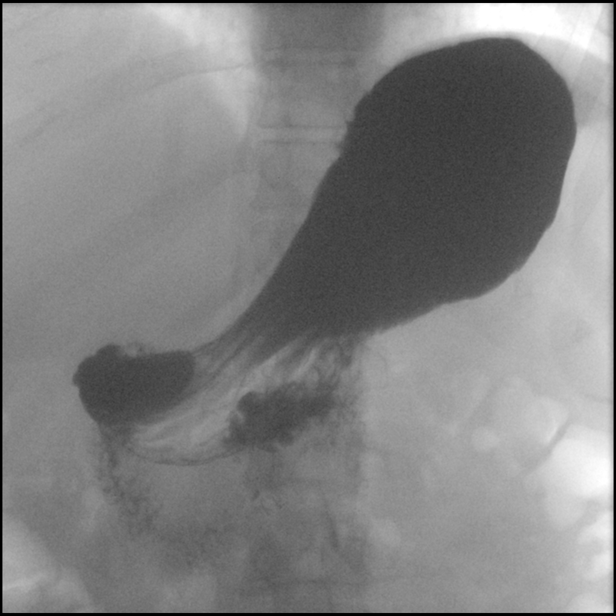

[15 of 16 positions shown; findings below may reference images not displayed]

FINDINGS: Scout radiograph:  Unremarkable bowel gas pattern.

Esophagus: No evidence of esophageal mass or stricture. Motility is
within normal limits. No gastroesophageal reflux observed.

Stomach: No hiatal hernia visualized. No evidence of gastric mass or
ulcer.

Duodenum: No ulcer or other significant abnormality seen involving
duodenal bulb or sweep.

Other:  None.
IMPRESSION: Negative upper GI series.

## 2018-02-15 ENCOUNTER — Encounter: Payer: Self-pay | Admitting: *Deleted

## 2018-02-21 DIAGNOSIS — M25552 Pain in left hip: Secondary | ICD-10-CM | POA: Diagnosis not present

## 2018-02-22 ENCOUNTER — Ambulatory Visit (INDEPENDENT_AMBULATORY_CARE_PROVIDER_SITE_OTHER): Payer: BLUE CROSS/BLUE SHIELD | Admitting: Psychiatry

## 2018-02-22 ENCOUNTER — Encounter (HOSPITAL_COMMUNITY): Payer: Self-pay | Admitting: Psychiatry

## 2018-02-22 ENCOUNTER — Telehealth: Payer: Self-pay | Admitting: *Deleted

## 2018-02-22 ENCOUNTER — Other Ambulatory Visit (HOSPITAL_COMMUNITY): Payer: Self-pay

## 2018-02-22 DIAGNOSIS — F319 Bipolar disorder, unspecified: Secondary | ICD-10-CM | POA: Diagnosis not present

## 2018-02-22 DIAGNOSIS — F411 Generalized anxiety disorder: Secondary | ICD-10-CM | POA: Diagnosis not present

## 2018-02-22 MED ORDER — ALPRAZOLAM 0.5 MG PO TABS: 0.5000 mg | ORAL_TABLET | Freq: Two times a day (BID) | ORAL | 2 refills | Status: DC | PRN

## 2018-02-22 MED ORDER — DIVALPROEX SODIUM ER 500 MG PO TB24: 2000.0000 mg | ORAL_TABLET | Freq: Every day | ORAL | 2 refills | Status: DC

## 2018-02-22 MED ORDER — LITHIUM CARBONATE ER 300 MG PO TBCR: 600.0000 mg | EXTENDED_RELEASE_TABLET | Freq: Every evening | ORAL | 2 refills | Status: DC

## 2018-02-22 NOTE — Progress Notes (Signed)
[facility] MD/PA/NP OP Progress Note  02/22/2018 10:48 AM Margaret Lopez  MRN:  [id number]  Chief Complaint:  Chief Complaint    Abnormal Lab; Follow-up     HPI: Margaret Lopez tells me that she has been doing well.  Her depression is well controlled.  She usually does not notice it much.  She has been motivated and social.  She went out of town for 1 week and got off her medication schedule.  She missed some partial doses of her Depakote.  This week she notes that she has been a little bit more irritable.  She is getting back on her normal medication regime.  Sleep is good.  She denies any suicidal or homicidal ideations.  She is denying any manic or hypomanic-like symptoms.  She tells me that she is having some anxiety.  While on vacation she hurt her leg and is experiencing a lot of pain.  She went to see a doctor yesterday and he told her that she is having "degloving" where the muscle detaches from the fascia and blood feels his face.  That pain could be contributing to her irritability as well.  Overall she feels that this is a good combination of medication that she wants to continue.  We did discuss her elevated TSH level that could be related to lithium side effects.  Despite that she would prefer to continue lithium at her current dose.  Visit Diagnosis:    ICD-10-CM   1. Bipolar 1 disorder, depressed (HCC) F31.9 divalproex (DEPAKOTE ER) 500 MG 24 hr tablet    lithium carbonate (LITHOBID) 300 MG CR tablet    ALPRAZolam (XANAX) 0.5 MG tablet  2. Generalized anxiety disorder F41.1 ALPRAZolam (XANAX) 0.5 MG tablet      Past Psychiatric History:  Anxiety:Yes Bipolar Disorder:Yes Depression:Yes Mania:Yes Psychosis:No Schizophrenia:No Personality Disorder:No Hospitalization for psychiatric illness:No History of Electroconvulsive Shock Therapy:No Prior Suicide Attempts:No    Past Medical History:  Past Medical History:  Diagnosis Date  . Anxiety   . Bipolar disorder ([place])   .  Depression   . Deviated nasal septum 02/2011  . Generalized anxiety disorder 04/16/2013  . GERD (gastroesophageal reflux disease)   . Headache(784.0)    migraines  . Insomnia   . Nasal turbinate hypertrophy 02/2011   bilat.  . Neuromuscular disorder [facility])    Dr. Jerilynn Mages. Domingo Cocking- does injections around nerve in her head for prevention of migraines - q 3 months   . OSA (obstructive sleep apnea)   . Pneumonia    hx of   . PONV (postoperative nausea and vomiting)   . Seasonal allergies   . Sleep apnea    uses BIPAP nightly; no longer using bipap, cleared from sleep apnea after bypass surgery    Past Surgical History:  Procedure Laterality Date  . ABDOMINAL HYSTERECTOMY    . CARPAL TUNNEL RELEASE Right   . DIAGNOSTIC LAPAROSCOPY    . FOOT SURGERY Right    bone removed from great toe   . GASTRIC ROUX-EN-Y N/A 05/16/2016   Procedure: LAPAROSCOPIC ROUX-EN-Y GASTRIC BYPASS WITH UPPER ENDOSCOPY;  Surgeon: Clovis Riley, MD;  Location: [facility];  Service: General;  Laterality: N/A;  . LAPAROSCOPIC VAGINAL HYSTERECTOMY  03/14/2007  . LIPOMA EXCISION Left 08/26/2016   Procedure: EXCISION LIPOMA FROM LEFT UPPER POSTERIOR THIGH;  Surgeon: Clovis Riley, MD;  Location: [place];  Service: General;  Laterality: Left;  . MYRINGOTOMY WITH TUBE PLACEMENT Bilateral 03/02/2015   Procedure: MYRINGOTOMY WITH T-TUBE PLACEMENT;  Surgeon: Leta Baptist, MD;  Location: [place];  Service: ENT;  Laterality: Bilateral;  . NASAL SEPTOPLASTY W/ TURBINOPLASTY  02/22/2011   Procedure: NASAL SEPTOPLASTY WITH TURBINATE REDUCTION;  Surgeon: Ascencion Dike, MD;  Location: [place];  Service: ENT;  Laterality: Bilateral;  . TUBAL LIGATION  12/16/2004  . TYMPANOSTOMY TUBE PLACEMENT Bilateral 07/21/2017    Family Psychiatric History:  Family History  Problem Relation Age of Onset  . ADD / ADHD Sister   . Hypertension Mother   . Sarcoidosis Mother   . Rheum arthritis Mother   . Hypertension Father   .  Heart Problems Father   . Alcohol abuse Maternal Grandmother   . Bipolar disorder Maternal Grandmother   . Breast cancer Maternal Grandmother   . Breast cancer Maternal Aunt   . Heart Problems Other        dad's side of family  . Suicidality Neg Hx     Social History:  Social History   Socioeconomic History  . Marital status: Legally Separated    Spouse name: Not on file  . Number of children: 2  . Years of education: Not on file  . Highest education level: Some college, no degree  Occupational History  . Not on file  Social Needs  . Financial resource strain: Not on file  . Food insecurity:    Worry: Not on file    Inability: Not on file  . Transportation needs:    Medical: Not on file    Non-medical: Not on file  Tobacco Use  . Smoking status: Former Smoker    Packs/day: 0.50    Years: 4.00    Pack years: 2.00    Types: Cigarettes    Last attempt to quit: 11/12/2015    Years since quitting: 2.2  . Smokeless tobacco: Never Used  Substance and Sexual Activity  . Alcohol use: No    Alcohol/week: 0.0 standard drinks    Comment: once a month or less  . Drug use: No  . Sexual activity: Yes    Partners: Male    Birth control/protection: Surgical    Comment: Hysterectomy  Lifestyle  . Physical activity:    Days per week: 5 days    Minutes per session: 40 min  . Stress: Only a little  Relationships  . Social connections:    Talks on phone: Not on file    Gets together: Not on file    Attends religious service: Not on file    Active member of club or organization: Not on file    Attends meetings of clubs or organizations: Not on file    Relationship status: Not on file  Other Topics Concern  . Not on file  Social History Narrative   Lives at home with boyfriend   Right handed   Caffeine: 2 cups daily    Allergies:  Allergies  Allergen Reactions  . Doxazosin Nausea And Vomiting  . Morphine And Related Itching  . Phenergan [Promethazine Hcl]     " knocks  out for several hours"  . Aloe Hives  . Penicillins Rash    Has patient had a PCN reaction causing immediate rash, facial/tongue/throat swelling, SOB or lightheadedness with hypotension: No Has patient had a PCN reaction causing severe rash involving mucus membranes or skin necrosis: No Has patient had a PCN reaction that required hospitalization No Has patient had a PCN reaction occurring within the last 10 years: No If all of the  above answers are "NO", then may proceed with Cephalosporin use.   . Sulfa Antibiotics Rash    Metabolic Disorder Labs: No results found for: HGBA1C, MPG No results found for: PROLACTIN No results found for: CHOL, TRIG, HDL, CHOLHDL, VLDL, LDLCALC Lab Results  Component Value Date   TSH 5.610 (H) 01/05/2018    Therapeutic Level Labs: Lab Results  Component Value Date   LITHIUM 0.6 01/05/2018   Lab Results  Component Value Date   VALPROATE 82 01/05/2018   No components found for:  CBMZ  Current Medications: Current Outpatient Medications  Medication Sig Dispense Refill  . acetaminophen (TYLENOL) 500 MG tablet Take 1,000 mg by mouth every 6 (six) hours as needed (for pain/headaches).     [name] albuterol (PROVENTIL HFA;VENTOLIN HFA) 108 (90 Base) MCG/ACT inhaler Inhale 1-2 puffs into the lungs every 6 (six) hours as needed for wheezing or shortness of breath.    . ALPRAZolam (XANAX) 0.5 MG tablet Take 1 tablet (0.5 mg total) by mouth 2 (two) times daily as needed for anxiety. 60 tablet 2  . cyclobenzaprine (FLEXERIL) 10 MG tablet Take 5-10 mg by mouth at bedtime as needed. TMJ  2  . Diclofenac Potassium (CAMBIA) 50 MG PACK Mix 1 pack (50 mg) with 1-2 oz of water and drink. May repeat in 12 hours if needed. Max 2 packs in 24 hours and max 9 packs per month. 9 each 3  . divalproex (DEPAKOTE ER) 500 MG 24 hr tablet Take 4 tablets (2,000 mg total) by mouth daily. 120 tablet 2  . EPINEPHrine 0.3 mg/0.3 mL IJ SOAJ injection Inject 0.3 mg into the muscle daily  as needed (for anaphylatic reactions.).   1  . Erenumab-aooe (AIMOVIG) 140 MG/ML SOAJ Inject 140 mg into the skin every 30 (thirty) days. 1 pen 11  . hydrOXYzine (ATARAX/VISTARIL) 25 MG tablet Take 25 mg by mouth at bedtime as needed for itching.   3  . levocetirizine (XYZAL) 5 MG tablet Take 5 mg by mouth at bedtime.     [name] lithium carbonate (LITHOBID) 300 MG CR tablet Take 2 tablets (600 mg total) by mouth every evening. 60 tablet 2  . Multiple Vitamins-Minerals (OPURITY BYPASS OPTIMIZED) CHEW Chew 1 tablet by mouth daily.    . naratriptan (AMERGE) 2.5 MG tablet Take one (1) tablet as needed for headache or migraine. Do not exceed two doses of any triptan in one day. 10 tablet 6  . ondansetron (ZOFRAN-ODT) 4 MG disintegrating tablet Take 4 mg by mouth every 6 (six) hours as needed for nausea or vomiting.   0  . prochlorperazine (COMPAZINE) 10 MG tablet Take 1 tablet (10 mg total) by mouth 3 (three) times daily as needed for nausea or vomiting (migraine). MAY CAUSE SEDATION. 30 tablet 3  . SUMAtriptan Succinate (ZEMBRACE SYMTOUCH) 3 MG/0.5ML SOAJ Inject 3 mg into the skin every 15 (fifteen) minutes. May repeat again in 2 hours Maximum 4 in one day. 12 pen 11  . valACYclovir (VALTREX) 500 MG tablet Take 500 mg by mouth See admin instructions. Takes 500mg  daily for 5 days when she has a flare up.  3  . zonisamide (ZONEGRAN) 25 MG capsule Take 50 mg by mouth at bedtime. Working up to 3 tablets at bedtime  2  . ipratropium (ATROVENT) 0.06 % nasal spray Place 2 sprays into both nostrils daily as needed. Allergies, runny nose.  4   No current facility-administered medications for this visit.      Musculoskeletal: Strength &  Muscle Tone: within normal limits Gait & Station: normal Patient leans: N/A  Psychiatric Specialty Exam: Review of Systems  Musculoskeletal: Negative for back pain, joint pain and neck pain.       Degloving on left leg- pain   Neurological: Positive for sensory change.  Negative for tingling and speech change.    Blood pressure 117/71, pulse 66, height 5\' 3"  (1.6 m), weight 136 lb (61.7 kg), SpO2 99 %.Body mass index is 24.09 kg/m.  General Appearance: Fairly Groomed  Eye Contact:  Good  Speech:  Clear and Coherent and Normal Rate  Volume:  Normal  Mood:  Euthymic  Affect:  Full Range  Thought Process:  Goal Directed and Descriptions of Associations: Intact  Orientation:  Full (Time, Place, and Person)  Thought Content:  Logical  Suicidal Thoughts:  No  Homicidal Thoughts:  No  Memory:  Immediate;   Good  Judgement:  Good  Insight:  Good  Psychomotor Activity:  Normal  Concentration:  Concentration: Good  Recall:  Good  Fund of Knowledge:  Good  Language:  Good  Akathisia:  No  Handed:  Right  AIMS (if indicated):     Assets:  Communication Skills Desire for Improvement Financial Resources/Insurance Housing Leisure Time Physical Health Resilience Social Support Talents/Skills Transportation Vocational/Educational  ADL's:  Intact  Cognition:  WNL  Sleep: good          Screenings: PHQ2-9     Nutrition from 07/12/2016 in [facility] Nutrition from 05/09/2016 in [facility] Nutrition from 02/05/2016 in [facility]  PHQ-2 Total Score  0  0  0      I have reviewed the information below on 02/22/2018 and have updated it Assessment and Plan: Bipolar I disorder; GAD    Medication management with supportive therapy. Risks and benefits, side effects and alternative treatment options discussed with patient. Pt was given an opportunity to ask questions about medication, illness, and treatment. All current psychiatric medications have been reviewed and discussed with the patient and adjusted as clinically appropriate. The patient has been provided an accurate and updated list of the medications being now prescribed. Patient expressed understanding of  how their medications were to be used.  Pt verbalized understanding and verbal consent obtained for treatment.  The risk of un-intended pregnancy is low based on the fact that pt reports she had a hysterecomy. Pt is aware that these meds carry a teratogenic risk. Pt will discuss plan of action if she does or plans to become pregnant in the future.  Status of current problems:  Stable  Meds: Lithium CR 600mg  po qPM for Bipolar disorder Xanax 0.5mg  po BID prn anxiety-she is only taking once a week Depakote ER 2000mg  po qD for  Bipolar disorder- at bedtime Patient has not been able to tolerate liquid Depakote and Depakote sprinkles.  Due to the size of the tablets she is having to break up the dose over several hours in the evening.  I recommended that patient talk with the pharmacist regarding the size of the pill and see if there are smaller size pills available   Labs: Reviewed labs done on 01/05/2018- CMP shows decrease in total protein; CBC shows decrease and RBCs and RDW; lithium 0.6; valproic acid 82; TSH elevated at 5.6.  I have ordered a repeat TSH, T3 uptake and free and T4 levels   Therapy: brief supportive therapy provided. Discussed psychosocial stressors in detail.   Reviewed  ways to manage anxiety and had patient make a list of her activities today and check off several things  Consultations: Dr. Coral Spikes at [facility] is PCP  Pt denies SI and is at an acute low risk for suicide. Patient told to call clinic if any problems occur. Patient advised to go to ER if they should develop SI/HI, side effects, or if symptoms worsen. Has crisis numbers to call if needed. Pt verbalized understanding.  F/up in 3 months or sooner if needed  The duration of this appointment visit was 65minutes of face-to-face time with the patient. Greater than 50% of this time was spent in counseling, explanation of diagnosis, planning of further management, and coordination of  care   Charlcie Cradle, MD 02/22/2018, 10:48 AM

## 2018-02-22 NOTE — Telephone Encounter (Signed)
PA for [facility] completed via CoverMyMeds. Key# [id number]. Dx: Chronic Migraine (G43.709). Tried/failed meds: Cambia, Naratriptan, Migranal nasal spray, oral Imitrex, Frova/fim

## 2018-02-23 LAB — TSH+T4F+T3FREE
Free T4: 0.98 ng/dL (ref 0.82–1.77)
T3, Free: 2.4 pg/mL (ref 2.0–4.4)
TSH: 2.86 u[IU]/mL (ref 0.450–4.500)

## 2018-02-23 LAB — T3 UPTAKE: T3 Uptake Ratio: 23 % — ABNORMAL LOW (ref 24–39)

## 2018-02-27 ENCOUNTER — Encounter: Payer: Self-pay | Admitting: *Deleted

## 2018-02-27 NOTE — Telephone Encounter (Signed)
Zembrace 3 mg injection has been approved from 02/22/2018 through 02/20/2021. Emailed pt through [employer] to let her know and also faxed letter to [facility]. Received a receipt of confirmation.

## 2018-03-02 ENCOUNTER — Encounter: Payer: Self-pay | Admitting: Plastic Surgery

## 2018-03-02 ENCOUNTER — Ambulatory Visit: Payer: BC Managed Care – PPO | Admitting: Plastic Surgery

## 2018-03-02 DIAGNOSIS — S8012XA Contusion of left lower leg, initial encounter: Secondary | ICD-10-CM

## 2018-03-02 DIAGNOSIS — S8010XA Contusion of unspecified lower leg, initial encounter: Secondary | ICD-10-CM | POA: Insufficient documentation

## 2018-03-02 NOTE — Progress Notes (Signed)
Patient ID: Margaret Lopez, female    DOB: 1970-11-04, 48 y.o.   MRN: [id number]   Chief Complaint  Patient presents with  . Advice Only    for (L) thigh injury    The patient is a 48 year old female here for evaluation of her left leg.  She was in [place] when she was injured.  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   She had some discomfort at the time.  Over the last two weeks she has gotten more uncomfortable. The pain seems to get worse with activity.  It is tender to touch.  It is 10 x 20 cm.  It is firm and bruised.  Rest makes it better.  She is otherwise in good health.  She has been wrapping the area.     Review of Systems  Constitutional: Positive for activity change. Negative for appetite change.  HENT: Negative.   Eyes: Negative.   Respiratory: Negative.  Negative for chest tightness and shortness of breath.   Cardiovascular: Positive for leg swelling.  Endocrine: Negative.   Genitourinary: Negative.   Musculoskeletal: Positive for gait problem.  Skin: Positive for color change.  Psychiatric/Behavioral: Negative.     Past Medical History:  Diagnosis Date  . Anxiety   . Bipolar disorder ([place])   . Depression   . Deviated nasal septum 02/2011  . Generalized anxiety disorder 04/16/2013  . GERD (gastroesophageal reflux disease)   . Headache(784.0)    migraines  . Insomnia   . Nasal turbinate hypertrophy 02/2011   bilat.  . Neuromuscular disorder [facility])    Dr. Jerilynn Mages. Domingo Cocking- does injections around nerve in her head for prevention of migraines - q 3 months   . OSA (obstructive sleep apnea)   . Pneumonia    hx of   . PONV (postoperative nausea and vomiting)   . Seasonal allergies   . Sleep apnea    uses BIPAP nightly; no longer using bipap, cleared from sleep apnea after bypass surgery    Past Surgical History:  Procedure Laterality Date  . ABDOMINAL HYSTERECTOMY    . CARPAL TUNNEL RELEASE Right   . DIAGNOSTIC LAPAROSCOPY     . FOOT SURGERY Right    bone removed from great toe   . GASTRIC ROUX-EN-Y N/A 05/16/2016   Procedure: LAPAROSCOPIC ROUX-EN-Y GASTRIC BYPASS WITH UPPER ENDOSCOPY;  Surgeon: Clovis Riley, MD;  Location: [facility];  Service: General;  Laterality: N/A;  . LAPAROSCOPIC VAGINAL HYSTERECTOMY  03/14/2007  . LIPOMA EXCISION Left 08/26/2016   Procedure: EXCISION LIPOMA FROM LEFT UPPER POSTERIOR THIGH;  Surgeon: Clovis Riley, MD;  Location: [place];  Service: General;  Laterality: Left;  . MYRINGOTOMY WITH TUBE PLACEMENT Bilateral 03/02/2015   Procedure: MYRINGOTOMY WITH T-TUBE PLACEMENT;  Surgeon: Leta Baptist, MD;  Location: [place];  Service: ENT;  Laterality: Bilateral;  . NASAL SEPTOPLASTY W/ TURBINOPLASTY  02/22/2011   Procedure: NASAL SEPTOPLASTY WITH TURBINATE REDUCTION;  Surgeon: Ascencion Dike, MD;  Location: [place];  Service: ENT;  Laterality: Bilateral;  . TUBAL LIGATION  12/16/2004  . TYMPANOSTOMY TUBE PLACEMENT Bilateral 07/21/2017      Current Outpatient Medications:  .  albuterol (PROVENTIL HFA;VENTOLIN HFA) 108 (90 Base) MCG/ACT inhaler, Inhale 1-2 puffs into the lungs every 6 (six) hours as needed for wheezing or shortness of breath., Disp: , Rfl:  .  ALPRAZolam (XANAX) 0.5 MG tablet, Take 1 tablet (  0.5 mg total) by mouth 2 (two) times daily as needed for anxiety., Disp: 60 tablet, Rfl: 2 .  cyclobenzaprine (FLEXERIL) 10 MG tablet, Take 5-10 mg by mouth at bedtime as needed. TMJ, Disp: , Rfl: 2 .  Diclofenac Potassium (CAMBIA) 50 MG PACK, Mix 1 pack (50 mg) with 1-2 oz of water and drink. May repeat in 12 hours if needed. Max 2 packs in 24 hours and max 9 packs per month., Disp: 9 each, Rfl: 3 .  divalproex (DEPAKOTE ER) 500 MG 24 hr tablet, Take 4 tablets (2,000 mg total) by mouth daily., Disp: 120 tablet, Rfl: 2 .  EPINEPHrine 0.3 mg/0.3 mL IJ SOAJ injection, Inject 0.3 mg into the muscle daily as needed (for anaphylatic reactions.). , Disp: , Rfl: 1 .   Erenumab-aooe (AIMOVIG) 140 MG/ML SOAJ, Inject 140 mg into the skin every 30 (thirty) days., Disp: 1 pen, Rfl: 11 .  hydrOXYzine (ATARAX/VISTARIL) 25 MG tablet, Take 25 mg by mouth at bedtime as needed for itching. , Disp: , Rfl: 3 .  ipratropium (ATROVENT) 0.06 % nasal spray, Place 2 sprays into both nostrils daily as needed. Allergies, runny nose., Disp: , Rfl: 4 .  levocetirizine (XYZAL) 5 MG tablet, Take 5 mg by mouth at bedtime. , Disp: , Rfl:  .  lithium carbonate (LITHOBID) 300 MG CR tablet, Take 2 tablets (600 mg total) by mouth every evening., Disp: 60 tablet, Rfl: 2 .  Multiple Vitamins-Minerals (OPURITY BYPASS OPTIMIZED) CHEW, Chew 1 tablet by mouth daily., Disp: , Rfl:  .  naratriptan (AMERGE) 2.5 MG tablet, Take one (1) tablet as needed for headache or migraine. Do not exceed two doses of any triptan in one day., Disp: 10 tablet, Rfl: 6 .  ondansetron (ZOFRAN-ODT) 4 MG disintegrating tablet, Take 4 mg by mouth every 6 (six) hours as needed for nausea or vomiting. , Disp: , Rfl: 0 .  prochlorperazine (COMPAZINE) 10 MG tablet, Take 1 tablet (10 mg total) by mouth 3 (three) times daily as needed for nausea or vomiting (migraine). MAY CAUSE SEDATION., Disp: 30 tablet, Rfl: 3 .  SUMAtriptan Succinate (ZEMBRACE SYMTOUCH) 3 MG/0.5ML SOAJ, Inject 3 mg into the skin every 15 (fifteen) minutes. May repeat again in 2 hours Maximum 4 in one day., Disp: 12 pen, Rfl: 11 .  valACYclovir (VALTREX) 500 MG tablet, Take 500 mg by mouth See admin instructions. Takes 500mg  daily for 5 days when she has a flare up., Disp: , Rfl: 3   Objective:   Vitals:   03/02/18 0922  BP: 113/74  Pulse: 72  Temp: 97.6 F (36.4 C)  SpO2: 100%    Physical Exam Vitals signs and nursing note reviewed.  Constitutional:      Appearance: Normal appearance.  HENT:     Head: Normocephalic and atraumatic.  Cardiovascular:     Rate and Rhythm: Normal rate.  Pulmonary:     Effort: Pulmonary effort is normal. No  respiratory distress.  Musculoskeletal:       Legs:  Neurological:     Mental Status: She is alert.  Psychiatric:        Mood and Affect: Mood normal.        Thought Content: Thought content normal.     Assessment & Plan:  Hematoma of left lower extremity, initial encounter  The hematoma was drained of 120 cc of blood. There does not appear to be active bleeding.  There was an area at the lateral inferior aspect that was firm.  Recommend ice to the area on and off today and keep it wrapped.  I would like to see her back in 1 week. [place], DO

## 2018-03-06 ENCOUNTER — Encounter: Payer: Self-pay | Admitting: Plastic Surgery

## 2018-03-06 ENCOUNTER — Ambulatory Visit (INDEPENDENT_AMBULATORY_CARE_PROVIDER_SITE_OTHER): Payer: BLUE CROSS/BLUE SHIELD | Admitting: Plastic Surgery

## 2018-03-06 VITALS — BP 110/67 | HR 64 | Temp 98.0°F | Ht 63.0 in | Wt 136.0 lb

## 2018-03-06 DIAGNOSIS — S8012XA Contusion of left lower leg, initial encounter: Secondary | ICD-10-CM | POA: Diagnosis not present

## 2018-03-06 NOTE — Progress Notes (Signed)
   Subjective:    Patient ID: Margaret Lopez, female    DOB: Apr 30, 1970, 48 y.o.   MRN: [id number]  The patient is a 48 year old female here for follow-up on a hematoma of her left leg.  Last week we got about 120 cc out she did feel better afterwards.  She started noticing it feeling back up.  It is a little bit tender but overall she is doing better.  It does not appear to be infected.  The surrounding tissue and bruise has improved.  She denies any fever.     Review of Systems  Constitutional: Positive for activity change.  HENT: Negative.   Eyes: Negative.   Respiratory: Negative.   Genitourinary: Negative.   Skin: Positive for color change.       Objective:   Physical Exam Vitals signs and nursing note reviewed.  HENT:     Head: Normocephalic.  Cardiovascular:     Rate and Rhythm: Normal rate and regular rhythm.  Musculoskeletal:       Legs:  Neurological:     Mental Status: She is alert.  Psychiatric:        Mood and Affect: Mood normal.        Thought Content: Thought content normal.        Judgment: Judgment normal.        Assessment & Plan:  Hematoma of left lower extremity, initial encounter  We were able to drain 70 cc of sanguinous fluid from the area.  There is still a firm area that is distal at the edge.  If that continues we may have to evacuate clot.  We have agreed to see her back in a week and reevaluate.

## 2018-03-09 ENCOUNTER — Ambulatory Visit: Payer: Self-pay | Admitting: Plastic Surgery

## 2018-03-13 ENCOUNTER — Ambulatory Visit (INDEPENDENT_AMBULATORY_CARE_PROVIDER_SITE_OTHER): Payer: BLUE CROSS/BLUE SHIELD | Admitting: Plastic Surgery

## 2018-03-13 ENCOUNTER — Encounter: Payer: Self-pay | Admitting: Plastic Surgery

## 2018-03-13 VITALS — BP 108/69 | HR 80 | Temp 97.9°F | Ht 63.0 in | Wt 136.0 lb

## 2018-03-13 DIAGNOSIS — S8012XA Contusion of left lower leg, initial encounter: Secondary | ICD-10-CM | POA: Diagnosis not present

## 2018-03-13 NOTE — Progress Notes (Signed)
   Subjective:    Patient ID: Margaret Lopez, female    DOB: 08/22/1970, 47 y.o.   MRN: [id number]  The patient is a 48 year old female here for follow-up on her left leg hematoma.  Overall she is doing better.  The bruising and swelling has improved.  There is the presence of hematoma noted again today.  It does not appear to be infected in any way.  There is likely an area at the distal portion that has some fat necrosis this may need to be excised but I highly recommend massage for now.   Review of Systems  Constitutional: Negative.   HENT: Negative.   Eyes: Negative.   Respiratory: Negative.   Cardiovascular: Negative.   Gastrointestinal: Negative.   Musculoskeletal: Negative.   Psychiatric/Behavioral: Negative.        Objective:   Physical Exam Vitals signs and nursing note reviewed.  Constitutional:      Appearance: Normal appearance.  HENT:     Head: Normocephalic and atraumatic.  Cardiovascular:     Rate and Rhythm: Normal rate.  Pulmonary:     Effort: Pulmonary effort is normal.  Neurological:     Mental Status: She is alert.  Psychiatric:        Mood and Affect: Mood normal.        Thought Content: Thought content normal.        Judgment: Judgment normal.       Assessment & Plan:  Hematoma of left lower extremity, initial encounter  The hematoma was drained again today and we got 30 cc of serous sanguinous fluid it did not appear to be infected.  She should continue with compression wrapping and follow-up in 10 days.

## 2018-03-14 ENCOUNTER — Telehealth: Payer: Self-pay | Admitting: *Deleted

## 2018-03-14 DIAGNOSIS — M25552 Pain in left hip: Secondary | ICD-10-CM | POA: Diagnosis not present

## 2018-03-14 NOTE — Telephone Encounter (Signed)
Completed PA for Aimovig 140 mg on Cover My Meds. KEY: [id number].   "If [employer] [place] has not responded in 3 business days or if you have any questions about your submission, contact [place] at [phone]."

## 2018-03-15 ENCOUNTER — Encounter: Payer: BLUE CROSS/BLUE SHIELD | Attending: Surgery | Admitting: Skilled Nursing Facility1

## 2018-03-15 DIAGNOSIS — E669 Obesity, unspecified: Secondary | ICD-10-CM | POA: Insufficient documentation

## 2018-03-15 NOTE — Progress Notes (Signed)
Post-Operative RYGB Surgery  Medical Nutrition Therapy:  Appt start time: 3:20 end time:  4:11.  Primary concerns today: Post-operative Bariatric Surgery Nutrition Management.  Non scale victories: more active  Pt states she has been feeling much better with sustained energy since adding in more carbohydrates. Pt states if she gest stressed or has been traveling she will not have a bowel movement. Pt states sometimes she will not have a bowel movement 4 days in a row. Pt states she does not drink plain water because it does not taste good. Pt states she is at the point she wants unhealthy food. Pt asked about sausage balls. Pt states she is disappointed she has not had any changes with her medications from losing the weight: Dietitian educated th ept on the fact the medications she takes are for disorders not caused by excess weight Pt states he has had some reflux.  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   Surgery date: 05/16/2016 Surgery type: RYGB Start weight at [facility]: 227.3 lbs 02/05/2016 Weight today: 136 Weight loss goal: less than 150 lbs, learn to eat healthier foods, be more active/present with family  Body Composition Scale  Total Body Fat: 26.4 %  Visceral Fat: 6  Fat-Free Mass: 73.5  %   Total Body Water: 51.2  %  Muscle-Mass: 51.2  lbs  Body Fat Displacement:  Torso:  22.3 lbs Left Leg: 4.4  lbs Right Leg: 4.4 lbs Left Arm: 2.2  lbs Right Arm: 2.2  lbs   24-hr recall: eating every 2-3 hours; 1 day a week not ; 1 protein shake most days of the week B (AM): regular coffee chic fila fried chicken in sauce  Snk (AM):  Decaf coffee or propel L (PM): chicken thigh with cream cheese shredded cheese and bacon with green onion and mac n cheese  Snk (PM): sometimes yogurt or uncrustable  4pm (early dinner):  pimento cheese and bagel crisps and cucmbers or hamburgers and mac n cheese and salad  D (PM): ground hamburger with ketchup on hotdog bun with cheese and lettuce and tater tots  Snk (PM): popcorn   Fluid intake: 2 decaff coffee, 1 bottle of propel, sugar free koolaid in water, and caffinated coffee: not tracking  Estimated total protein intake: 60g  Medications: See list Supplementation: opurity once a day and tums 3 times a day  Using straws: no Drinking while eating: no Having you been chewing well: yes Chewing/swallowing difficulties: once Changes in vision: no Changes to mood/headaches: no Hair loss/Changes to skin/Changes to nails: no Any difficulty focusing or concentrating: no Sweating: no Dizziness/Lightheaded: no Palpitations: no  Carbonated beverages: no N/V/D/C/GAS: no Abdominal Pain: no Dumping syndrome: no  Recent physical activity:  Walking 4 days a week 20-30 minutes   Progress Towards Goal(s):  In progress.     Intervention:  Nutrition education and counseling. Goals: -Only use half a packet of your flavorings in your water -Aim to walk a minimum of 5 days a week -Aim to get in 64 fluid ounces a day -Be sure to have non starchy vegetables with every lunch and dinner -Have one carbohydrate choice with your meals for example: bread or potato  -Take your time when eating, eating until satisfaction not fullness -Slow everything down   -Try meditation on Youtube   Teaching Method  Utilized:  [employer] Hands on  Barriers to learning/adherence to lifestyle change: none  Demonstrated degree of understanding via:  Teach Back   Monitoring/Evaluation:  Dietary intake, exercise, and body weight. Return in 3 months.

## 2018-03-15 NOTE — Patient Instructions (Addendum)
-  Only use half a packet of your flavorings in your water  -Aim to walk a minimum of 5 days a week  -Aim to get in 64 fluid ounces a day  -Be sure to have non starchy vegetables with every lunch and dinner  -Have one carbohydrate choice with your meals for example: bread or potato   -Take your time when eating, eating until satisfaction not fullness  -Slow everything down    -Try meditation on Youtube

## 2018-03-19 DIAGNOSIS — J3089 Other allergic rhinitis: Secondary | ICD-10-CM | POA: Diagnosis not present

## 2018-03-19 DIAGNOSIS — J0101 Acute recurrent maxillary sinusitis: Secondary | ICD-10-CM | POA: Diagnosis not present

## 2018-03-22 NOTE — Telephone Encounter (Signed)
PA was canceled by [facility] so I completed another PA for Aimovig 140 mg. [id number]. Waiting to hear back from plan. Suspect it will be denied d/t concomitant botox use.

## 2018-03-26 ENCOUNTER — Other Ambulatory Visit: Payer: Self-pay

## 2018-03-26 ENCOUNTER — Encounter: Payer: Self-pay | Admitting: Plastic Surgery

## 2018-03-26 ENCOUNTER — Ambulatory Visit (INDEPENDENT_AMBULATORY_CARE_PROVIDER_SITE_OTHER): Payer: BLUE CROSS/BLUE SHIELD | Admitting: Plastic Surgery

## 2018-03-26 VITALS — BP 114/65 | HR 76 | Temp 98.6°F | Ht 63.0 in | Wt 137.0 lb

## 2018-03-26 DIAGNOSIS — S8012XA Contusion of left lower leg, initial encounter: Secondary | ICD-10-CM

## 2018-03-26 NOTE — Telephone Encounter (Signed)
We received a determination from [place]. Aimovig has been denied because it is being taken with Botox. Med is denied when it is taken with Botox or when patient has received Botox in the last 3 months.   If we should choose to appeal, fax to [phone].  Savings card is available. Mychart message sent to patient.

## 2018-03-26 NOTE — Progress Notes (Signed)
   Subjective:    Patient ID: Margaret Lopez, female    DOB: 07-01-1970, 48 y.o.   MRN: [id number]  The patient is a 48 year old female who is here for follow-up after injury to her left leg.  She had a hematoma and has been drained several times.  The bruising and swelling is markedly improved.  She has an area of 4 x 8 cm of firmness.  This is most likely fat necrosis.  She is using an Ace wrap and trying to massage the area.  It seems to be getting harder instead of softer.  No sign of infection.   Review of Systems  Constitutional: Negative.  Negative for activity change and appetite change.  HENT: Negative.   Eyes: Negative.   Respiratory: Negative.   Cardiovascular: Positive for leg swelling.  Gastrointestinal: Negative.   Genitourinary: Negative.   Musculoskeletal: Negative.   Hematological: Negative.   Psychiatric/Behavioral: Negative.        Objective:   Physical Exam Vitals signs reviewed.  Constitutional:      Appearance: Normal appearance.  HENT:     Head: Normocephalic and atraumatic.  Cardiovascular:     Rate and Rhythm: Normal rate.  Pulmonary:     Effort: Pulmonary effort is normal.  Musculoskeletal:       Legs:  Neurological:     General: No focal deficit present.     Mental Status: She is alert.  Psychiatric:        Mood and Affect: Mood normal.        Thought Content: Thought content normal.        Judgment: Judgment normal.        Assessment & Plan:  Hematoma of left lower extremity, initial encounter  We try to drain any more seroma and got 10 cc out of serous fluid.  The rest is firm and most likely fat necrosis.  She was given the option for waiting and massage versus intervention and she would like to proceed with excision.

## 2018-04-06 ENCOUNTER — Ambulatory Visit (INDEPENDENT_AMBULATORY_CARE_PROVIDER_SITE_OTHER): Payer: BLUE CROSS/BLUE SHIELD | Admitting: Family Medicine

## 2018-04-06 ENCOUNTER — Other Ambulatory Visit: Payer: Self-pay

## 2018-04-06 ENCOUNTER — Encounter: Payer: Self-pay | Admitting: Family Medicine

## 2018-04-06 DIAGNOSIS — G43711 Chronic migraine without aura, intractable, with status migrainosus: Secondary | ICD-10-CM

## 2018-04-06 MED ORDER — CYCLOBENZAPRINE HCL 10 MG PO TABS: 5.0000 mg | ORAL_TABLET | Freq: Every evening | ORAL | 2 refills | Status: DC | PRN

## 2018-04-06 MED ORDER — GABAPENTIN 100 MG PO CAPS: 100.0000 mg | ORAL_CAPSULE | Freq: Three times a day (TID) | ORAL | 11 refills | Status: DC

## 2018-04-06 NOTE — Telephone Encounter (Signed)
Spoke with pt on phone briefly to update her allergies, medications, etc regarding history. She stated nothing has changed. Advised Amy NP will call her soon for her telephone appt. She verbalized appreciation.

## 2018-04-06 NOTE — Progress Notes (Signed)
PATIENT: Margaret Lopez DOB: Jun 01, 1970  REASON FOR VISIT: follow up HISTORY FROM: patient  Virtual Visit via Telephone Note  I connected with Margaret Lopez on 04/06/18 at 11:00 AM EDT by telephone and verified that I am speaking with the correct person using two identifiers.   I discussed the limitations, risks, security and privacy concerns of performing an evaluation and management service by telephone and the availability of in person appointments. I also discussed with the patient that there may be a patient responsible charge related to this service. The patient expressed understanding and agreed to proceed.   History of Present Illness:  04/06/18 Margaret Lopez is a 48 y.o. female for follow up.  Reports that over the last couple of months her headaches have worsened.  Specifically over the last month have become available.  She feels that this is related to weather changes and stress.  She reports that migraines are typically unilateral.  She has noted some tingling sensation of her head recently.  This usually occurs with exacerbation of TMJ previously treated by her ENT provider.  She is currently taking Depakote 2000 mg daily for mood stabilization.  She is receiving Botox treatments every 3 months.  She just started Aimovig about 5 days ago.  She is using is in brace for therapy.  She was taking Flexeril 5 to 10 mg at night for TMJ did seem to help with migraine treatment as well.  She has been out of this medication since December due to no recent follow-up with provider.  She is taking gabapentin in the past prescribed by her psychiatrist for mood.  She reports tolerating this well.   Observations/Objective:  Generalized: Well developed, in no acute distress  Mentation: Alert oriented to time, place, history taking. Follows all commands speech and language fluent   Assessment and Plan:  48 y.o. year old female  has a past medical history of Anxiety, Bipolar disorder ([place]),  Depression, Deviated nasal septum (02/2011), Generalized anxiety disorder (04/16/2013), GERD (gastroesophageal reflux disease), Headache(784.0), Insomnia, Nasal turbinate hypertrophy (02/2011), Neuromuscular disorder ([place]), OSA (obstructive sleep apnea), Pneumonia, PONV (postoperative nausea and vomiting), Seasonal allergies, and Sleep apnea. with    ICD-10-CM   1. Chronic migraine without aura, with intractable migraine, so stated, with status migrainosus G43.711    Unfortunately running has been suffering with worsening migraines over the last couple of months.  She was able to get Aimovig through her pharmacy and first dose was 5 days ago.  She continues to have tightness in her jaw and a tingling sensation in her head.  We will restart Flexeril 5 to 10 mg at night as needed.  I have also called in gabapentin 100 mg capsules up to 3 times per day as needed.  She was educated on how to titrate up medication and verbalizes understanding.  She was advised to touch base with me on Monday via my chart for a progress report.  At that time we will consider either steroid Dosepak or possible nerve block pending response to medications over the weekend.  I would like to avoid prednisone if possible due to potential worsening of mood.  She verbalizes understanding and agreement with the plan.  She will reach out to [name] with any new or worsening symptoms.  No orders of the defined types were placed in this encounter.   Meds ordered this encounter  Medications  . gabapentin (NEURONTIN) 100 MG capsule    Sig: Take 1 capsule (100  mg total) by mouth 3 (three) times daily.    Dispense:  90 capsule    Refill:  11    Order Specific Question:   Supervising Provider    Answer:   Melvenia Beam [id number]  . cyclobenzaprine (FLEXERIL) 10 MG tablet    Sig: Take 0.5-1 tablets (5-10 mg total) by mouth at bedtime as needed. TMJ    Dispense:  30 tablet    Refill:  2    Order Specific Question:   Supervising Provider     Answer:   Melvenia Beam [id number]     Follow Up Instructions:  I discussed the assessment and treatment plan with the patient. The patient was provided an opportunity to ask questions and all were answered. The patient agreed with the plan and demonstrated an understanding of the instructions.   The patient was advised to call back or seek an in-person evaluation if the symptoms worsen or if the condition fails to improve as anticipated.  I provided 25 minutes of non-face-to-face time during this encounter.  Patient reports being visit.  Provider at her place of residence.  Margaret Martinez,  RN facilitated this visit.   Debbora Presto, NP

## 2018-04-08 NOTE — Progress Notes (Signed)
Made any corrections needed, and agree with history, physical, neuro exam,assessment and plan as stated.     Kiko Ripp, MD [facility]  

## 2018-04-25 ENCOUNTER — Telehealth: Payer: Self-pay | Admitting: Neurology

## 2018-05-03 ENCOUNTER — Ambulatory Visit: Payer: BLUE CROSS/BLUE SHIELD | Admitting: Neurology

## 2018-05-10 NOTE — Telephone Encounter (Signed)
I called and spoke with the patient regarding changing her apt due to COVID-19. I confirmed that she has not shown any new symptoms, been exposed to the virus nor been running a fever. I also informed her she would need to wear a mask and gloves.  °

## 2018-05-21 ENCOUNTER — Telehealth: Payer: Self-pay | Admitting: Neurology

## 2018-05-21 NOTE — Telephone Encounter (Signed)
Called pt to schedule 3 mo f/u w Debbora Presto, NP and pt stated that she has apt w Dr. Jaynee Eagles on 05/22/18 and w ask her if she needs to schedule the apt or not

## 2018-05-22 ENCOUNTER — Other Ambulatory Visit: Payer: Self-pay

## 2018-05-22 ENCOUNTER — Ambulatory Visit (INDEPENDENT_AMBULATORY_CARE_PROVIDER_SITE_OTHER): Payer: BLUE CROSS/BLUE SHIELD | Admitting: Neurology

## 2018-05-22 DIAGNOSIS — G43711 Chronic migraine without aura, intractable, with status migrainosus: Secondary | ICD-10-CM

## 2018-05-22 DIAGNOSIS — M7918 Myalgia, other site: Secondary | ICD-10-CM

## 2018-05-22 MED ORDER — UBROGEPANT 50 MG PO TABS: 50.0000 mg | ORAL_TABLET | ORAL | 6 refills | Status: DC | PRN

## 2018-05-22 MED ORDER — METHYLPREDNISOLONE ACETATE 80 MG/ML IJ SUSP
40.0000 mg | Freq: Once | INTRAMUSCULAR | Status: AC
  Administered 2018-05-22: 40 mg via INTRAMUSCULAR

## 2018-05-22 MED ORDER — KETOROLAC TROMETHAMINE 60 MG/2ML IM SOLN
60.0000 mg | Freq: Once | INTRAMUSCULAR | Status: AC
  Administered 2018-05-22: 60 mg via INTRAMUSCULAR

## 2018-05-22 NOTE — Progress Notes (Signed)
Botox- 100 units x 2 vials Lot: [id number] Expiration: 09/2020 NDC: 2094-7096-28  Bacteriostatic 0.9% Sodium Chloride- 72mL total Lot: [id number] Expiration: 10/11/2018 NDC: 7654-6503-54  Dx: S56.812 B/B  Pt given IM Toradol 60 mg x 1 & Depomedrol 40 mg IM per v.o. Dr. Jaynee Eagles. Pt tolerated well. Aseptic technique used & bandaids applied.

## 2018-05-22 NOTE — Progress Notes (Signed)
Consent Form Botulism Toxin Injection For Chronic Migraine  Interval history 01/23/2018: This is her fourth injection. Baseline 15 headache days a month and 10 migraine days a month. Excellent response, she has > 70% reduction in migraines and headaches monthly. Discussed acute management prescribed frova, zembrace works quickly but the migraines rebound Frova has a long half life. If insurance problems with the Frova can try Amerge.  Patient felt her forehead and eyebrows were uncomfortable due to inability to move them, reduced injections 1/2 injections in the corrigators and procerus, did the frontal very high and was much better. +10 each masseter, +5 each lateral pterygoid, +5 bilat levator scapulae. +temples.  Lum Babe at [facility] Therapy at [facility] patient requests for Cervical Myofascial Pain syndrome. Dry needling gave her a very bad migraine will not order that again.   Frova was approved but too expensive. Using sumatriptan injection and then amerge if needed sue to having a longer 1/2 life. Reordered PT for cervico-myofasial pain syndrome and Ubrelvy as adjunct to acute management.    Meds ordered this encounter  Medications  . methylPREDNISolone acetate (DEPO-MEDROL) injection 40 mg  . ketorolac (TORADOL) injection 60 mg  . Ubrogepant (UBRELVY) 50 MG TABS    Sig: Take 50 mg by mouth every 2 (two) hours as needed. Max 200mg  a day    Dispense:  10 tablet    Refill:  6    Patient has copay card; she can have medication regardless of insurance approval or copay amount.   Orders Placed This Encounter  Procedures  . Ambulatory referral to Physical Therapy    Reviewed orally with patient, additionally signature is on file:  Botulism toxin has been approved by the Federal drug administration for treatment of chronic migraine. Botulism toxin does not cure chronic migraine and it may not be effective in some patients.  The administration of botulism toxin is accomplished  by injecting a small amount of toxin into the muscles of the neck and head. Dosage must be titrated for each individual. Any benefits resulting from botulism toxin tend to wear off after 3 months with a repeat injection required if benefit is to be maintained. Injections are usually done every 3-4 months with maximum effect peak achieved by about 2 or 3 weeks. Botulism toxin is expensive and you should be sure of what costs you will incur resulting from the injection.  The side effects of botulism toxin use for chronic migraine may include:   -Transient, and usually mild, facial weakness with facial injections  -Transient, and usually mild, head or neck weakness with head/neck injections  -Reduction or loss of forehead facial animation due to forehead muscle weakness  -Eyelid drooping  -Dry eye  -Pain at the site of injection or bruising at the site of injection  -Double vision  -Potential unknown long term risks  Contraindications: You should not have Botox if you are pregnant, nursing, allergic to albumin, have an infection, skin condition, or muscle weakness at the site of the injection, or have myasthenia gravis, Lambert-Eaton syndrome, or ALS.  It is also possible that as with any injection, there may be an allergic reaction or no effect from the medication. Reduced effectiveness after repeated injections is sometimes seen and rarely infection at the injection site may occur. All care will be taken to prevent these side effects. If therapy is given over a long time, atrophy and wasting in the muscle injected may occur. Occasionally the patient's become refractory to treatment because  they develop antibodies to the toxin. In this event, therapy needs to be modified.  I have read the above information and consent to the administration of botulism toxin.    BOTOX PROCEDURE NOTE FOR MIGRAINE HEADACHE    Contraindications and precautions discussed with patient(above). Aseptic procedure was  observed and patient tolerated procedure. Procedure performed by Dr. Georgia Dom  The condition has existed for more than 6 months, and pt does not have a diagnosis of ALS, Myasthenia Gravis or Lambert-Eaton Syndrome.  Risks and benefits of injections discussed and pt agrees to proceed with the procedure.  Written consent obtained  These injections are medically necessary. Pt  receives good benefits from these injections. These injections do not cause sedations or hallucinations which the oral therapies may cause.  Indication/Diagnosis: chronic migraine BOTOX(J0585) injection was performed according to protocol by [employer]. 200 units of BOTOX was dissolved into 4 cc NS.   NDC: [address]-77   Description of procedure:  The patient was placed in a sitting position. The standard protocol was used for Botox as follows, with 5 units of Botox injected at each site:   -Procerus muscle, midline injection 2.5 units   -Corrugator muscle, bilateral injection 2.5 units each  -Frontalis muscle, bilateral injection, with 2 sites each side, medial injection was performed in the upper one third of the frontalis muscle, in the region vertical from the medial inferior edge of the superior orbital rim. The lateral injection was again in the upper one third of the forehead vertically above the lateral limbus of the cornea, 1.5 cm lateral to the medial injection site.  - Levator Scapulae: 5 units bilaterally   -Temporalis muscle injection, 5 sites, bilaterally. The first injection was 3 cm above the tragus of the ear, second injection site was 1.5 cm to 3 cm up from the first injection site in line with the tragus of the ear. The third injection site was 1.5-3 cm forward between the first 2 injection sites. The fourth injection site was 1.5 cm posterior to the second injection site. 5th site laterally in the temporalis at the level of the outer canthus.  - Patient feels her clenching is a trigger for  headaches. +10 units masseter bilaterally and +5 units bilater lateral pterygoids.  -Occipitalis muscle injection, 3 sites, bilaterally. The first injection was done one half way between the occipital protuberance and the tip of the mastoid process behind the ear. The second injection site was done lateral and superior to the first, 1 fingerbreadth from the first injection. The third injection site was 1 fingerbreadth superiorly and medially from the first injection site.  -Cervical paraspinal muscle injection, 2 sites, bilateral knee first injection site was 1 cm from the midline of the cervical spine, 3 cm inferior to the lower border of the occipital protuberance. The second injection site was 1.5 cm superiorly and laterally to the first injection site.  -Trapezius muscle injection was performed at 3 sites, bilaterally. The first injection site was in the upper trapezius muscle halfway between the inflection point of the neck, and the acromion. The second injection site was one half way between the acromion and the first injection site. The third injection was done between the first injection site and the inflection point of the neck.   Will return for repeat injection in 3 months.   198 unit sof Botox was used, 198 units were injected, the rest of the Botox was wasted. The patient tolerated the procedure well, there were no complications  of the above procedure.

## 2018-05-24 ENCOUNTER — Other Ambulatory Visit: Payer: Self-pay

## 2018-05-24 ENCOUNTER — Ambulatory Visit (INDEPENDENT_AMBULATORY_CARE_PROVIDER_SITE_OTHER): Payer: BLUE CROSS/BLUE SHIELD | Admitting: Psychiatry

## 2018-05-24 ENCOUNTER — Encounter (HOSPITAL_COMMUNITY): Payer: Self-pay | Admitting: Psychiatry

## 2018-05-24 ENCOUNTER — Encounter: Payer: Self-pay | Admitting: *Deleted

## 2018-05-24 ENCOUNTER — Telehealth: Payer: Self-pay | Admitting: *Deleted

## 2018-05-24 DIAGNOSIS — F411 Generalized anxiety disorder: Secondary | ICD-10-CM | POA: Diagnosis not present

## 2018-05-24 DIAGNOSIS — F319 Bipolar disorder, unspecified: Secondary | ICD-10-CM | POA: Diagnosis not present

## 2018-05-24 MED ORDER — DIVALPROEX SODIUM ER 500 MG PO TB24: 2000.0000 mg | ORAL_TABLET | Freq: Every day | ORAL | 2 refills | Status: DC

## 2018-05-24 MED ORDER — LITHIUM CARBONATE ER 300 MG PO TBCR: 600.0000 mg | EXTENDED_RELEASE_TABLET | Freq: Every evening | ORAL | 2 refills | Status: DC

## 2018-05-24 NOTE — Telephone Encounter (Signed)
Roselyn Meier PA completed on CMM. Key: [id number]. Awaiting [employer] determination.

## 2018-05-24 NOTE — Telephone Encounter (Signed)
Received denial of Ubrelvy from [facility] d/t patient being on another CGRP. The patient can still receive the medication with the Ubrelvy savings card. Will allow her to get 10 tablets per month for $10. Sent pt mychart message with update.   If we should choose to appeal, fax to [phone].

## 2018-05-24 NOTE — Progress Notes (Signed)
Virtual Visit via Telephone Note  I connected with Margaret Lopez on 05/24/18 at 10:15 AM EDT by telephone and verified that I am speaking with the correct person using two identifiers.  Location: Patient: home Provider: home   I discussed the limitations, risks, security and privacy concerns of performing an evaluation and management service by telephone and the availability of in person appointments. I also discussed with the patient that there may be a patient responsible charge related to this service. The patient expressed understanding and agreed to proceed.   05/24/2018 10:32 AM Margaret Lopez  MRN:  [id number]  Chief Complaint:  Chief Complaint    Anxiety; Follow-up     HPI: "Of course stress has been higher. I have been more irritable". She feels it is understandable due to all that is going on. She is having more frequent migraines, her boyfriend's 54yo son is living with them since March, working from home and home schooling her kids has been stressful. Tynisa denies sadness and crying spells. For one month she was taking Neurontin TID so she was only taking 2-3 tabs of Depakote at bedtime. For the last 2 weeks she has been back on her full dose of Depakote and the irritability is slowly improving and more controllable. She denies any other symptoms of manic and hypomania. She denies SI/HI. Sleep is fair. Anxiety is worse due to stressors and she is taking her Xanax.    Visit Diagnosis:    ICD-10-CM   1. Generalized anxiety disorder F41.1   2. Bipolar 1 disorder, depressed (HCC) F31.9 divalproex (DEPAKOTE ER) 500 MG 24 hr tablet    lithium carbonate (LITHOBID) 300 MG CR tablet      Past Psychiatric History:  Anxiety:Yes Bipolar Disorder:Yes Depression:Yes Mania:Yes Psychosis:No Schizophrenia:No Personality Disorder:No Hospitalization for psychiatric illness:No History of Electroconvulsive Shock Therapy:No Prior Suicide Attempts:No    Past Medical History:   Past Medical History:  Diagnosis Date  . Anxiety   . Bipolar disorder ([place])   . Depression   . Deviated nasal septum 02/2011  . Generalized anxiety disorder 04/16/2013  . GERD (gastroesophageal reflux disease)   . Headache(784.0)    migraines  . Insomnia   . Nasal turbinate hypertrophy 02/2011   bilat.  . Neuromuscular disorder [facility])    Dr. Jerilynn Mages. Domingo Cocking- does injections around nerve in her head for prevention of migraines - q 3 months   . OSA (obstructive sleep apnea)   . Pneumonia    hx of   . PONV (postoperative nausea and vomiting)   . Seasonal allergies   . Sleep apnea    uses BIPAP nightly; no longer using bipap, cleared from sleep apnea after bypass surgery    Past Surgical History:  Procedure Laterality Date  . ABDOMINAL HYSTERECTOMY    . CARPAL TUNNEL RELEASE Right   . DIAGNOSTIC LAPAROSCOPY    . FOOT SURGERY Right    bone removed from great toe   . GASTRIC ROUX-EN-Y N/A 05/16/2016   Procedure: LAPAROSCOPIC ROUX-EN-Y GASTRIC BYPASS WITH UPPER ENDOSCOPY;  Surgeon: Clovis Riley, MD;  Location: [facility];  Service: General;  Laterality: N/A;  . LAPAROSCOPIC VAGINAL HYSTERECTOMY  03/14/2007  . LIPOMA EXCISION Left 08/26/2016   Procedure: EXCISION LIPOMA FROM LEFT UPPER POSTERIOR THIGH;  Surgeon: Clovis Riley, MD;  Location: [place];  Service: General;  Laterality: Left;  . MYRINGOTOMY WITH TUBE PLACEMENT Bilateral 03/02/2015   Procedure: MYRINGOTOMY WITH T-TUBE PLACEMENT;  Surgeon: Leta Baptist, MD;  Location: [facility];  Service: ENT;  Laterality: Bilateral;  . NASAL SEPTOPLASTY W/ TURBINOPLASTY  02/22/2011   Procedure: NASAL SEPTOPLASTY WITH TURBINATE REDUCTION;  Surgeon: Ascencion Dike, MD;  Location: [place];  Service: ENT;  Laterality: Bilateral;  . TUBAL LIGATION  12/16/2004  . TYMPANOSTOMY TUBE PLACEMENT Bilateral 07/21/2017    Family Psychiatric History:  Family History  Problem Relation Age of Onset  . ADD / ADHD Sister   . Hypertension  Mother   . Sarcoidosis Mother   . Rheum arthritis Mother   . Hypertension Father   . Heart Problems Father   . Alcohol abuse Maternal Grandmother   . Bipolar disorder Maternal Grandmother   . Breast cancer Maternal Grandmother   . Breast cancer Maternal Aunt   . Heart Problems Other        dad's side of family  . Suicidality Neg Hx     Social History:  Social History   Socioeconomic History  . Marital status: Legally Separated    Spouse name: Not on file  . Number of children: 2  . Years of education: Not on file  . Highest education level: Some college, no degree  Occupational History  . Not on file  Social Needs  . Financial resource strain: Not on file  . Food insecurity:    Worry: Not on file    Inability: Not on file  . Transportation needs:    Medical: Not on file    Non-medical: Not on file  Tobacco Use  . Smoking status: Former Smoker    Packs/day: 0.50    Years: 4.00    Pack years: 2.00    Types: Cigarettes    Last attempt to quit: 11/12/2015    Years since quitting: 2.5  . Smokeless tobacco: Never Used  Substance and Sexual Activity  . Alcohol use: No    Alcohol/week: 0.0 standard drinks    Comment: once a month or less  . Drug use: No  . Sexual activity: Yes    Partners: Male    Birth control/protection: Surgical    Comment: Hysterectomy  Lifestyle  . Physical activity:    Days per week: 5 days    Minutes per session: 40 min  . Stress: Only a little  Relationships  . Social connections:    Talks on phone: Not on file    Gets together: Not on file    Attends religious service: Not on file    Active member of club or organization: Not on file    Attends meetings of clubs or organizations: Not on file    Relationship status: Not on file  Other Topics Concern  . Not on file  Social History Narrative   Lives at home with boyfriend   Right handed   Caffeine: 2 cups daily    Allergies:  Allergies  Allergen Reactions  . Doxazosin Nausea And  Vomiting  . Morphine And Related Itching  . Phenergan [Promethazine Hcl]     " knocks out for several hours"  . Aloe Hives  . Penicillins Rash    Has patient had a PCN reaction causing immediate rash, facial/tongue/throat swelling, SOB or lightheadedness with hypotension: No Has patient had a PCN reaction causing severe rash involving mucus membranes or skin necrosis: No Has patient had a PCN reaction that required hospitalization No Has patient had a PCN reaction occurring within the last 10 years: No If all of the above answers are "NO", then may proceed  with Cephalosporin use.   . Sulfa Antibiotics Rash    Metabolic Disorder Labs: No results found for: HGBA1C, MPG No results found for: PROLACTIN No results found for: CHOL, TRIG, HDL, CHOLHDL, VLDL, LDLCALC Lab Results  Component Value Date   TSH 2.860 02/22/2018   TSH 5.610 (H) 01/05/2018    Therapeutic Level Labs: Lab Results  Component Value Date   LITHIUM 0.6 01/05/2018   Lab Results  Component Value Date   VALPROATE 82 01/05/2018   No components found for:  CBMZ  Current Medications: Current Outpatient Medications  Medication Sig Dispense Refill  . albuterol (PROVENTIL HFA;VENTOLIN HFA) 108 (90 Base) MCG/ACT inhaler Inhale 1-2 puffs into the lungs every 6 (six) hours as needed for wheezing or shortness of breath.    . ALPRAZolam (XANAX) 0.5 MG tablet Take 1 tablet (0.5 mg total) by mouth 2 (two) times daily as needed for anxiety. 60 tablet 2  . cyclobenzaprine (FLEXERIL) 10 MG tablet Take 0.5-1 tablets (5-10 mg total) by mouth at bedtime as needed. TMJ 30 tablet 2  . Diclofenac Potassium (CAMBIA) 50 MG PACK Mix 1 pack (50 mg) with 1-2 oz of water and drink. May repeat in 12 hours if needed. Max 2 packs in 24 hours and max 9 packs per month. 9 each 3  . divalproex (DEPAKOTE ER) 500 MG 24 hr tablet Take 4 tablets (2,000 mg total) by mouth daily. 120 tablet 2  . Erenumab-aooe (AIMOVIG) 140 MG/ML SOAJ Inject 140 mg into  the skin every 30 (thirty) days. 1 pen 11  . ipratropium (ATROVENT) 0.06 % nasal spray Place 2 sprays into both nostrils daily as needed. Allergies, runny nose.  4  . levocetirizine (XYZAL) 5 MG tablet Take 5 mg by mouth at bedtime.     [name] lithium carbonate (LITHOBID) 300 MG CR tablet Take 2 tablets (600 mg total) by mouth every evening. 60 tablet 2  . Multiple Vitamins-Minerals (OPURITY BYPASS OPTIMIZED) CHEW Chew 1 tablet by mouth daily.    . naratriptan (AMERGE) 2.5 MG tablet Take one (1) tablet as needed for headache or migraine. Do not exceed two doses of any triptan in one day. 10 tablet 6  . ondansetron (ZOFRAN-ODT) 4 MG disintegrating tablet Take 4 mg by mouth every 6 (six) hours as needed for nausea or vomiting.   0  . prochlorperazine (COMPAZINE) 10 MG tablet Take 1 tablet (10 mg total) by mouth 3 (three) times daily as needed for nausea or vomiting (migraine). MAY CAUSE SEDATION. 30 tablet 3  . SUMAtriptan Succinate (ZEMBRACE SYMTOUCH) 3 MG/0.5ML SOAJ Inject 3 mg into the skin every 15 (fifteen) minutes. May repeat again in 2 hours Maximum 4 in one day. 12 pen 11  . valACYclovir (VALTREX) 500 MG tablet Take 500 mg by mouth See admin instructions. Takes 500mg  daily for 5 days when she has a flare up.  3  . EPINEPHrine 0.3 mg/0.3 mL IJ SOAJ injection Inject 0.3 mg into the muscle daily as needed (for anaphylatic reactions.).   1  . gabapentin (NEURONTIN) 100 MG capsule Take 1 capsule (100 mg total) by mouth 3 (three) times daily. (Patient not taking: Reported on 05/24/2018) 90 capsule 11  . hydrOXYzine (ATARAX/VISTARIL) 25 MG tablet Take 25 mg by mouth at bedtime as needed for itching.   3  . Ubrogepant (UBRELVY) 50 MG TABS Take 50 mg by mouth every 2 (two) hours as needed. Max 200mg  a day (Patient not taking: Reported on 05/24/2018) 10 tablet 6  No current facility-administered medications for this visit.      MSE: Siriah is alert and oriented x4.  She was pleasant, calm and cooperative.   She was engaged in the conversation and answered questions appropriately.  Speech was clear and coherent with normal tone, rate and volume.  Mood is irritable affect was full.  Thought processes were linear, goal oriented and intact.  Thought content is logical.  Izzy denies SI/HI.  Sheena denies AVH and did not appear to be responding to internal stimuli.  Concentration, attention and memory were good.  [place] of knowledge and use of language were good.  Her insight and judgment are fair.  I am unable to comment on her physical appearance, hygiene, eye contact or psychomotor activity is I am unable to physically see her.           Screenings: PHQ2-9     Nutrition from 07/12/2016 in [facility] Nutrition from 05/09/2016 in [facility] Nutrition from 02/05/2016 in [facility]  PHQ-2 Total Score  0  0  0      I reviewed the information below on May 24, 2018 and have updated it Assessment and Plan: Bipolar I disorder; GAD    Medication management with supportive therapy. Risks and benefits, side effects and alternative treatment options discussed with patient. Pt was given an opportunity to ask questions about medication, illness, and treatment. All current psychiatric medications have been reviewed and discussed with the patient and adjusted as clinically appropriate. The patient has been provided an accurate and updated list of the medications being now prescribed. Patient expressed understanding of how their medications were to be used.  Pt verbalized understanding and verbal consent obtained for treatment.  The risk of un-intended pregnancy is low based on the fact that pt reports she had a hysterecomy. Pt is aware that these meds carry a teratogenic risk. Pt will discuss plan of action if she does or plans to become pregnant in the future.  Status of current problems:  Irritability is slowly  improving  Meds: Lithium CR 600mg  po qPM for Bipolar disorder Xanax 0.5mg  po BID prn anxiety-she is only taking once a week and states no refill today Depakote ER 2000mg  po qD for  Bipolar disorder- at bedtime Patient has not been able to tolerate liquid Depakote and Depakote sprinkles.  Due to the size of the tablets she is having to break up the dose over several hours in the evening.   Labs: none ordered today   Therapy: brief supportive therapy provided. Discussed psychosocial stressors in detail.     Consultations:  None Dr. Coral Spikes at [facility] physicians is PCP  Pt denies SI and is at an acute low risk for suicide. Patient told to call clinic if any problems occur. Patient advised to go to ER if they should develop SI/HI, side effects, or if symptoms worsen. Has crisis numbers to call if needed. Pt verbalized understanding.  F/up in 3 months or sooner if needed  I provided 15 minutes of non-face-to-face time during this encounter    Charlcie Cradle, MD 05/24/2018, 10:32 AM

## 2018-05-28 DIAGNOSIS — R110 Nausea: Secondary | ICD-10-CM | POA: Diagnosis not present

## 2018-05-28 DIAGNOSIS — R69 Illness, unspecified: Secondary | ICD-10-CM | POA: Diagnosis not present

## 2018-05-28 DIAGNOSIS — Z9884 Bariatric surgery status: Secondary | ICD-10-CM | POA: Diagnosis not present

## 2018-05-31 ENCOUNTER — Encounter: Payer: Self-pay | Admitting: Physical Therapy

## 2018-05-31 ENCOUNTER — Ambulatory Visit: Payer: BLUE CROSS/BLUE SHIELD | Attending: Neurology | Admitting: Physical Therapy

## 2018-05-31 ENCOUNTER — Other Ambulatory Visit: Payer: Self-pay

## 2018-05-31 DIAGNOSIS — M542 Cervicalgia: Secondary | ICD-10-CM | POA: Insufficient documentation

## 2018-05-31 DIAGNOSIS — G44221 Chronic tension-type headache, intractable: Secondary | ICD-10-CM | POA: Diagnosis not present

## 2018-05-31 DIAGNOSIS — R252 Cramp and spasm: Secondary | ICD-10-CM | POA: Insufficient documentation

## 2018-05-31 NOTE — Therapy (Signed)
[place] [address] [place], [place], [address] Phone: [phone]   Fax:  [phone]  Physical Therapy Evaluation  Patient Details  Name: Margaret Lopez MRN: [id number] Date of Birth: 1970-12-20 Referring Provider (PT): Jaynee Eagles   Encounter Date: 05/31/2018  PT End of Session - 05/31/18 1054    Visit Number  1    Date for PT Re-Evaluation  07/31/18    PT Start Time  3009    PT Stop Time  1110    PT Time Calculation (min)  55 min    Activity Tolerance  Patient tolerated treatment well    Behavior During Therapy  [facility] for tasks assessed/performed       Past Medical History:  Diagnosis Date  . Anxiety   . Bipolar disorder ([place])   . Depression   . Deviated nasal septum 02/2011  . Generalized anxiety disorder 04/16/2013  . GERD (gastroesophageal reflux disease)   . Headache(784.0)    migraines  . Insomnia   . Nasal turbinate hypertrophy 02/2011   bilat.  . Neuromuscular disorder [facility])    Dr. Jerilynn Mages. Domingo Cocking- does injections around nerve in her head for prevention of migraines - q 3 months   . OSA (obstructive sleep apnea)   . Pneumonia    hx of   . PONV (postoperative nausea and vomiting)   . Seasonal allergies   . Sleep apnea    uses BIPAP nightly; no longer using bipap, cleared from sleep apnea after bypass surgery    Past Surgical History:  Procedure Laterality Date  . ABDOMINAL HYSTERECTOMY    . CARPAL TUNNEL RELEASE Right   . DIAGNOSTIC LAPAROSCOPY    . FOOT SURGERY Right    bone removed from great toe   . GASTRIC ROUX-EN-Y N/A 05/16/2016   Procedure: LAPAROSCOPIC ROUX-EN-Y GASTRIC BYPASS WITH UPPER ENDOSCOPY;  Surgeon: Clovis Riley, MD;  Location: [facility];  Service: General;  Laterality: N/A;  . LAPAROSCOPIC VAGINAL HYSTERECTOMY  03/14/2007  . LIPOMA EXCISION Left 08/26/2016   Procedure: EXCISION LIPOMA FROM LEFT UPPER POSTERIOR THIGH;  Surgeon: Clovis Riley, MD;  Location: [place];  Service: General;   Laterality: Left;  . MYRINGOTOMY WITH TUBE PLACEMENT Bilateral 03/02/2015   Procedure: MYRINGOTOMY WITH T-TUBE PLACEMENT;  Surgeon: Leta Baptist, MD;  Location: [place];  Service: ENT;  Laterality: Bilateral;  . NASAL SEPTOPLASTY W/ TURBINOPLASTY  02/22/2011   Procedure: NASAL SEPTOPLASTY WITH TURBINATE REDUCTION;  Surgeon: Ascencion Dike, MD;  Location: [place];  Service: ENT;  Laterality: Bilateral;  . TUBAL LIGATION  12/16/2004  . TYMPANOSTOMY TUBE PLACEMENT Bilateral 07/21/2017    There were no vitals filed for this visit.   Subjective Assessment - 05/31/18 1022    Subjective  Patient was seen here last year with good results of decreased neck pain and HA's and was able to safely return to a gym.  She reports that she was doing really great up until March.  She reports that the stress of the Covid as well as job changes may have increased her pain and tension, she knows that she clenches her teeth and has strategies.  REports that she is back to having at least 3 dyas of HA's per week.    Patient Stated Goals  hurts, reports when she has the HA it really limits her function    Currently in Pain?  Yes    Pain Score  7  Pain Location  Neck   and headache   Pain Descriptors / Indicators  Spasm;Tightness    Pain Type  Chronic pain    Pain Radiating Towards  reports that she has some numbness in the 3rd, 4th and 5th digits of the right hand with activity    Pain Onset  More than a month ago    Pain Frequency  Constant    Aggravating Factors   stress, clenching jaw pain up to 10/10 at times    Pain Relieving Factors  Botox injections have helped, pain could be down to a 1-2/10    Effect of Pain on Daily Activities  when things are bad she reports that she is limited in everything         [facility] PT Assessment - 05/31/18 0001      Assessment   Medical Diagnosis  neck pain and HA    Referring Provider (PT)  Jaynee Eagles    Onset Date/Surgical Date  05/01/18     Prior Therapy  yes last year with good results      Precautions   Precautions  None      Balance Screen   Has the patient fallen in the past 6 months  No    Has the patient had a decrease in activity level because of a fear of falling?   No    Is the patient reluctant to leave their home because of a fear of falling?   No      Home Environment   Additional Comments  Does light housework      Prior Function   Level of Independence  Independent    Vocation  Part time employment    Vocation Requirements  mostly on the computer     Leisure  reports that she was doing well with exercises but has stopped since February and has not returned      Posture/Postural Control   Posture Comments  fwd head, rounded shoulders      AROM   Overall AROM Comments  cervical ROM is decreased 50% with c/o tightness, shoulders WFL's      Strength   Overall Strength Comments  UE strength 3+/5 with some pain, this a a big change from when I last saw her      Flexibility   Soft Tissue Assessment /Muscle Length  --   tight pectorals and anterior shoulder     Palpation   Palpation comment  she is very tender and has a lot os knots in the upper traps, tight and tneder in the rhomboids and in the cervical area                Objective measurements completed on examination: See above findings.      [facility] Adult PT Treatment/Exercise - 05/31/18 0001      Modalities   Modalities  Electrical Stimulation;Moist Heat;Ultrasound      Moist Heat Therapy   Number Minutes Moist Heat  15 Minutes    Moist Heat Location  Cervical      Electrical Stimulation   Electrical Stimulation Location  right upper trap and neck area    Electrical Stimulation Action  IFC    Electrical Stimulation Parameters  sitting    Electrical Stimulation Goals  Pain               PT Short Term Goals - 05/31/18 1057      PT SHORT TERM GOAL #1   Title  independent  with initial HEP    Time  2    Period  Weeks     Status  New        PT Long Term Goals - 05/31/18 1057      PT LONG TERM GOAL #1   Title  understand proper posture and body mechanics    Period  Weeks    Status  New      PT LONG TERM GOAL #2   Title  report HA frequency decreased 50%    Time  8    Period  Weeks    Status  New      PT LONG TERM GOAL #3   Title  report HA intensity decreased 25%    Time  8    Period  Weeks    Status  New      PT LONG TERM GOAL #4   Title  increase UE strength to 4/5    Time  8    Period  Weeks    Status  New      PT LONG TERM GOAL #5   Title  incresae cervical ROM 25%    Time  8    Period  Weeks    Status  New             Plan - 05/31/18 1055    Clinical Impression Statement  Patient was seen here last year for severe HA's, we were able to have her mostly symptom free at times and she was going to the gym.  She reports that over the past 5 months she has had increased stress with job and all of the [employer] and has not been able to do any exercises, her HA's and neck pain are back and is having >3 per week.  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, she does recieve botox injections in her jaw and head    Stability/Clinical Decision Making  Stable/Uncomplicated    Clinical Decision Making  Low    Rehab Potential  Good    PT Frequency  2x / week    PT Duration  8 weeks    PT Treatment/Interventions  ADLs/Self Care Home Management;Cryotherapy;Electrical Stimulation;Moist Heat;Traction;Ultrasound;Patient/family education;Manual techniques;Dry needling    PT Next Visit Plan  slowly try to ease back into exercises, work on the spasms and the pain    Consulted and Agree with Plan of Care  Patient       Patient will benefit from skilled therapeutic intervention in order to improve the following deficits and impairments:  Increased muscle spasms, Improper body mechanics, Decreased range of motion, Decreased strength,  Postural dysfunction, Pain  Visit Diagnosis: Cervicalgia - Plan: PT plan of care cert/re-cert  Chronic tension-type headache, intractable - Plan: PT plan of care cert/re-cert  Cramp and spasm - Plan: PT plan of care cert/re-cert     Problem List Patient Active Problem List   Diagnosis Date Noted  . Hematoma of leg 03/02/2018  . Chronic migraine without aura, with intractable migraine, so stated, with status migrainosus 06/18/2017  . GAD (generalized anxiety disorder) 04/16/2013  . Bipolar 1 disorder, depressed ([place]) 04/16/2013    Sumner Boast., PT 05/31/2018, 10:59 AM  [address] [place], [place], [address] Phone: [phone]   Fax:  [phone]  Name: ALIDA GREINER MRN: [id number] Date of Birth: [birth year]

## 2018-06-07 ENCOUNTER — Encounter: Payer: Self-pay | Admitting: Physical Therapy

## 2018-06-07 ENCOUNTER — Ambulatory Visit: Payer: BLUE CROSS/BLUE SHIELD | Admitting: Physical Therapy

## 2018-06-07 ENCOUNTER — Other Ambulatory Visit: Payer: Self-pay

## 2018-06-07 DIAGNOSIS — G44221 Chronic tension-type headache, intractable: Secondary | ICD-10-CM

## 2018-06-07 DIAGNOSIS — R252 Cramp and spasm: Secondary | ICD-10-CM

## 2018-06-07 DIAGNOSIS — M542 Cervicalgia: Secondary | ICD-10-CM | POA: Diagnosis not present

## 2018-06-07 NOTE — Therapy (Signed)
Margaret Lopez, [place], [address] Phone: [phone]   Fax:  [phone]  Physical Therapy Treatment  Patient Details  Name: Margaret Lopez MRN: [id number] Date of Birth: 03-Mar-1970 Referring Provider (PT): Jaynee Eagles   Encounter Date: 06/07/2018  PT End of Session - 06/07/18 1216    Visit Number  2    Date for PT Re-Evaluation  07/31/18    PT Start Time  1100    PT Stop Time  1145    PT Time Calculation (min)  45 min    Activity Tolerance  Patient tolerated treatment well    Behavior During Therapy  [facility] for tasks assessed/performed       Past Medical History:  Diagnosis Date  . Anxiety   . Bipolar disorder ([place])   . Depression   . Deviated nasal septum 02/2011  . Generalized anxiety disorder 04/16/2013  . GERD (gastroesophageal reflux disease)   . Headache(784.0)    migraines  . Insomnia   . Nasal turbinate hypertrophy 02/2011   bilat.  . Neuromuscular disorder [facility])    Dr. Jerilynn Mages. Domingo Cocking- does injections around nerve in her head for prevention of migraines - q 3 months   . OSA (obstructive sleep apnea)   . Pneumonia    hx of   . PONV (postoperative nausea and vomiting)   . Seasonal allergies   . Sleep apnea    uses BIPAP nightly; no longer using bipap, cleared from sleep apnea after bypass surgery    Past Surgical History:  Procedure Laterality Date  . ABDOMINAL HYSTERECTOMY    . CARPAL TUNNEL RELEASE Right   . DIAGNOSTIC LAPAROSCOPY    . FOOT SURGERY Right    bone removed from great toe   . GASTRIC ROUX-EN-Y N/A 05/16/2016   Procedure: LAPAROSCOPIC ROUX-EN-Y GASTRIC BYPASS WITH UPPER ENDOSCOPY;  Surgeon: Clovis Riley, MD;  [facility];  Service: General;  Laterality: N/A;  . LAPAROSCOPIC VAGINAL HYSTERECTOMY  03/14/2007  . LIPOMA EXCISION Left 08/26/2016   Procedure: EXCISION LIPOMA FROM LEFT UPPER POSTERIOR THIGH;  Surgeon: Clovis Riley, MD;  Location: [place];  Service: General;   Laterality: Left;  . MYRINGOTOMY WITH TUBE PLACEMENT Bilateral 03/02/2015   Procedure: MYRINGOTOMY WITH T-TUBE PLACEMENT;  Surgeon: Leta Baptist, MD;  Location: [place];  Service: ENT;  Laterality: Bilateral;  . NASAL SEPTOPLASTY W/ TURBINOPLASTY  02/22/2011   Procedure: NASAL SEPTOPLASTY WITH TURBINATE REDUCTION;  Surgeon: Ascencion Dike, MD;  Location: [place];  Service: ENT;  Laterality: Bilateral;  . TUBAL LIGATION  12/16/2004  . TYMPANOSTOMY TUBE PLACEMENT Bilateral 07/21/2017    There were no vitals filed for this visit.  Subjective Assessment - 06/07/18 1159    Subjective  Patient reports that with all of the stomrs her head and neck are worse today, has a HA and feels very stiff    Currently in Pain?  Yes    Pain Score  8     Pain Location  Neck    Aggravating Factors   wet weather                       [facility] Adult PT Treatment/Exercise - 06/07/18 0001      Modalities   Modalities  Electrical Stimulation;Moist Heat;Ultrasound      Moist Heat Therapy   Number Minutes Moist Heat  15 Minutes    Moist Heat Location  Cervical      Electrical Stimulation   Electrical Stimulation Location  right upper trap and neck area    Electrical Stimulation Action  IFC    Electrical Stimulation Parameters  supine    Electrical Stimulation Goals  Pain      Manual Therapy   Manual Therapy  Soft tissue mobilization;Manual Traction    Soft tissue mobilization  rhomboids, upper traps and the cervical parapsinals    Manual Traction  gentle occipital release with some gentle manual shoulder depression and stretches               PT Short Term Goals - 06/07/18 1219      PT SHORT TERM GOAL #1   Title  independent with initial HEP    Status  On-going        PT Long Term Goals - 05/31/18 1057      PT LONG TERM GOAL #1   Title  understand proper posture and body mechanics    Period  Weeks    Status  New      PT LONG TERM GOAL #2    Title  report HA frequency decreased 50%    Time  8    Period  Weeks    Status  New      PT LONG TERM GOAL #3   Title  report HA intensity decreased 25%    Time  8    Period  Weeks    Status  New      PT LONG TERM GOAL #4   Title  increase UE strength to 4/5    Time  8    Period  Weeks    Status  New      PT LONG TERM GOAL #5   Title  incresae cervical ROM 25%    Time  8    Period  Weeks    Status  New            Plan - 06/07/18 1217    Clinical Impression Statement  Patient reports some increased HA and stiffness due to the wet and stormy weather.  She is very tense and tight in the upper trap and the neck, she seemed to like the gentle distraction    PT Next Visit Plan  may try traction    Consulted and Agree with Plan of Care  Patient       Patient will benefit from skilled therapeutic intervention in order to improve the following deficits and impairments:  Increased muscle spasms, Improper body mechanics, Decreased range of motion, Decreased strength, Postural dysfunction, Pain  Visit Diagnosis: Cervicalgia  Chronic tension-type headache, intractable  Cramp and spasm     Problem List Patient Active Problem List   Diagnosis Date Noted  . Hematoma of leg 03/02/2018  . Chronic migraine without aura, with intractable migraine, so stated, with status migrainosus 06/18/2017  . GAD (generalized anxiety disorder) 04/16/2013  . Bipolar 1 disorder, depressed ([place]) 04/16/2013    Margaret Lopez., PT 06/07/2018, 12:20 PM  [address] [place], [place], [address] Phone: [phone]   Fax:  [phone]  Name: Margaret Lopez MRN: [id number] Date of Birth: February 18, 1970

## 2018-06-12 ENCOUNTER — Other Ambulatory Visit: Payer: Self-pay

## 2018-06-12 ENCOUNTER — Ambulatory Visit (INDEPENDENT_AMBULATORY_CARE_PROVIDER_SITE_OTHER): Payer: BC Managed Care – PPO | Admitting: Nurse Practitioner

## 2018-06-12 ENCOUNTER — Encounter: Payer: Self-pay | Admitting: Nurse Practitioner

## 2018-06-12 ENCOUNTER — Encounter: Payer: Self-pay | Admitting: Plastic Surgery

## 2018-06-12 VITALS — BP 107/59 | HR 78 | Temp 98.3°F | Ht 63.0 in | Wt 139.0 lb

## 2018-06-12 DIAGNOSIS — S8012XA Contusion of left lower leg, initial encounter: Secondary | ICD-10-CM

## 2018-06-12 NOTE — H&P (View-Only) (Signed)
History of Present Illness: Margaret Lopez is a 48 y.o.  female  with a history of hematoma to left upper leg. The hematoma developed after her leg was stuck between a boat trailer and a sign. She presents today for pre-operative history and physical for an excision of left upper leg fat necrosis, scheduled for 06/21/18 with Dr. Marla Roe. The area measures 8 x 4 cm. She has had fluid aspirated before. She has some tenderness and  decreased sensation in the area. She has noticed some improvement with alternation of hot and cold applications. She does not like to massage the area because it "feels funny." She has a history of gastric bypass in 2018. She has history of frequent constipation and generally has 1 bowel movement per week. She has taken miralax in the past, but is not currently taking anything for constipation. She is frequently nauseous and takes zofran on a regular basis. She had nausea following her previous surgery. Denies tobacco use. Prefers liquids medications. Her boyfriend will be assisting her on the day of surgery. She is worried about dealing with anxiety in the pre-op area.     Past Medical History: Allergies: Allergies  Allergen Reactions  . Doxazosin Nausea And Vomiting  . Morphine And Related Itching  . Phenergan [Promethazine Hcl]     " knocks out for several hours"  . Aloe Hives  . Penicillins Rash    Has patient had a PCN reaction causing immediate rash, facial/tongue/throat swelling, SOB or lightheadedness with hypotension: No Has patient had a PCN reaction causing severe rash involving mucus membranes or skin necrosis: No Has patient had a PCN reaction that required hospitalization No Has patient had a PCN reaction occurring within the last 10 years: No If all of the above answers are "NO", then may proceed with Cephalosporin use.   . Sulfa Antibiotics Rash    Current Medications:  Current Outpatient Medications:  .  albuterol (PROVENTIL HFA;VENTOLIN HFA)  108 (90 Base) MCG/ACT inhaler, Inhale 1-2 puffs into the lungs every 6 (six) hours as needed for wheezing or shortness of breath., Disp: , Rfl:  .  ALPRAZolam (XANAX) 0.5 MG tablet, Take 1 tablet (0.5 mg total) by mouth 2 (two) times daily as needed for anxiety., Disp: 60 tablet, Rfl: 2 .  cyclobenzaprine (FLEXERIL) 10 MG tablet, Take 0.5-1 tablets (5-10 mg total) by mouth at bedtime as needed. TMJ, Disp: 30 tablet, Rfl: 2 .  Diclofenac Potassium (CAMBIA) 50 MG PACK, Mix 1 pack (50 mg) with 1-2 oz of water and drink. May repeat in 12 hours if needed. Max 2 packs in 24 hours and max 9 packs per month., Disp: 9 each, Rfl: 3 .  divalproex (DEPAKOTE ER) 500 MG 24 hr tablet, Take 4 tablets (2,000 mg total) by mouth daily., Disp: 120 tablet, Rfl: 2 .  EPINEPHrine 0.3 mg/0.3 mL IJ SOAJ injection, Inject 0.3 mg into the muscle daily as needed (for anaphylatic reactions.). , Disp: , Rfl: 1 .  Erenumab-aooe (AIMOVIG) 140 MG/ML SOAJ, Inject 140 mg into the skin every 30 (thirty) days., Disp: 1 pen, Rfl: 11 .  gabapentin (NEURONTIN) 100 MG capsule, Take 1 capsule (100 mg total) by mouth 3 (three) times daily. (Patient not taking: Reported on 05/24/2018), Disp: 90 capsule, Rfl: 11 .  hydrOXYzine (ATARAX/VISTARIL) 25 MG tablet, Take 25 mg by mouth at bedtime as needed for itching. , Disp: , Rfl: 3 .  ipratropium (ATROVENT) 0.06 % nasal spray, Place 2 sprays into both nostrils daily  as needed. Allergies, runny nose., Disp: , Rfl: 4 .  levocetirizine (XYZAL) 5 MG tablet, Take 5 mg by mouth at bedtime. , Disp: , Rfl:  .  lithium carbonate (LITHOBID) 300 MG CR tablet, Take 2 tablets (600 mg total) by mouth every evening., Disp: 60 tablet, Rfl: 2 .  Multiple Vitamins-Minerals (OPURITY BYPASS OPTIMIZED) CHEW, Chew 1 tablet by mouth daily., Disp: , Rfl:  .  naratriptan (AMERGE) 2.5 MG tablet, Take one (1) tablet as needed for headache or migraine. Do not exceed two doses of any triptan in one day., Disp: 10 tablet, Rfl: 6 .   ondansetron (ZOFRAN-ODT) 4 MG disintegrating tablet, Take 4 mg by mouth every 6 (six) hours as needed for nausea or vomiting. , Disp: , Rfl: 0 .  prochlorperazine (COMPAZINE) 10 MG tablet, Take 1 tablet (10 mg total) by mouth 3 (three) times daily as needed for nausea or vomiting (migraine). MAY CAUSE SEDATION., Disp: 30 tablet, Rfl: 3 .  SUMAtriptan Succinate (ZEMBRACE SYMTOUCH) 3 MG/0.5ML SOAJ, Inject 3 mg into the skin every 15 (fifteen) minutes. May repeat again in 2 hours Maximum 4 in one day., Disp: 12 pen, Rfl: 11 .  Ubrogepant (UBRELVY) 50 MG TABS, Take 50 mg by mouth every 2 (two) hours as needed. Max 200mg  a day (Patient not taking: Reported on 05/24/2018), Disp: 10 tablet, Rfl: 6 .  valACYclovir (VALTREX) 500 MG tablet, Take 500 mg by mouth See admin instructions. Takes 500mg  daily for 5 days when she has a flare up., Disp: , Rfl: 3  Past Medical Problems: Past Medical History:  Diagnosis Date  . Anxiety   . Bipolar disorder ([place])   . Depression   . Deviated nasal septum 02/2011  . Generalized anxiety disorder 04/16/2013  . GERD (gastroesophageal reflux disease)   . Headache(784.0)    migraines  . Insomnia   . Nasal turbinate hypertrophy 02/2011   bilat.  . Neuromuscular disorder [facility])    Dr. Jerilynn Mages. Domingo Cocking- does injections around nerve in her head for prevention of migraines - q 3 months   . OSA (obstructive sleep apnea)   . Pneumonia    hx of   . PONV (postoperative nausea and vomiting)   . Seasonal allergies   . Sleep apnea    uses BIPAP nightly; no longer using bipap, cleared from sleep apnea after bypass surgery    Past Surgical History: Past Surgical History:  Procedure Laterality Date  . ABDOMINAL HYSTERECTOMY    . CARPAL TUNNEL RELEASE Right   . DIAGNOSTIC LAPAROSCOPY    . FOOT SURGERY Right    bone removed from great toe   . GASTRIC ROUX-EN-Y N/A 05/16/2016   Procedure: LAPAROSCOPIC ROUX-EN-Y GASTRIC BYPASS WITH UPPER ENDOSCOPY;  Surgeon: Clovis Riley, MD;   Location: [facility];  Service: General;  Laterality: N/A;  . LAPAROSCOPIC VAGINAL HYSTERECTOMY  03/14/2007  . LIPOMA EXCISION Left 08/26/2016   Procedure: EXCISION LIPOMA FROM LEFT UPPER POSTERIOR THIGH;  Surgeon: Clovis Riley, MD;  Location: [place];  Service: General;  Laterality: Left;  . MYRINGOTOMY WITH TUBE PLACEMENT Bilateral 03/02/2015   Procedure: MYRINGOTOMY WITH T-TUBE PLACEMENT;  Surgeon: Leta Baptist, MD;  Location: [place];  Service: ENT;  Laterality: Bilateral;  . NASAL SEPTOPLASTY W/ TURBINOPLASTY  02/22/2011   Procedure: NASAL SEPTOPLASTY WITH TURBINATE REDUCTION;  Surgeon: Ascencion Dike, MD;  Location: [place];  Service: ENT;  Laterality: Bilateral;  . TUBAL LIGATION  12/16/2004  . TYMPANOSTOMY TUBE PLACEMENT  Bilateral 07/21/2017    The patient has had anesthesia or sedation in the past.   The patient HAS had problems with anesthesia: nausea The patient does not have a family history of anesthesia problems.    Social History: Social History   Socioeconomic History  . Marital status: Legally Separated    Spouse name: Not on file  . Number of children: 2  . Years of education: Not on file  . Highest education level: Some college, no degree  Occupational History  . Not on file  Social Needs  . Financial resource strain: Not on file  . Food insecurity:    Worry: Not on file    Inability: Not on file  . Transportation needs:    Medical: Not on file    Non-medical: Not on file  Tobacco Use  . Smoking status: Former Smoker    Packs/day: 0.50    Years: 4.00    Pack years: 2.00    Types: Cigarettes    Last attempt to quit: 11/12/2015    Years since quitting: 2.5  . Smokeless tobacco: Never Used  Substance and Sexual Activity  . Alcohol use: No    Alcohol/week: 0.0 standard drinks    Comment: once a month or less  . Drug use: No  . Sexual activity: Yes    Partners: Male    Birth control/protection: Surgical    Comment: Hysterectomy   Lifestyle  . Physical activity:    Days per week: 5 days    Minutes per session: 40 min  . Stress: Only a little  Relationships  . Social connections:    Talks on phone: Not on file    Gets together: Not on file    Attends religious service: Not on file    Active member of club or organization: Not on file    Attends meetings of clubs or organizations: Not on file    Relationship status: Not on file  . Intimate partner violence:    Fear of current or ex partner: Not on file    Emotionally abused: Not on file    Physically abused: Not on file    Forced sexual activity: Not on file  Other Topics Concern  . Not on file  Social History Narrative   Lives at home with boyfriend   Right handed   Caffeine: 2 cups daily    Family History: Family History  Problem Relation Age of Onset  . ADD / ADHD Sister   . Hypertension Mother   . Sarcoidosis Mother   . Rheum arthritis Mother   . Hypertension Father   . Heart Problems Father   . Alcohol abuse Maternal Grandmother   . Bipolar disorder Maternal Grandmother   . Breast cancer Maternal Grandmother   . Breast cancer Maternal Aunt   . Heart Problems Other        dad's side of family  . Suicidality Neg Hx     Review of Systems: General ROS: anxious Dermatological ROS: negative Cardiovascular ROS: negative ENT ROS: negative Gastrointestinal ZWC:[id number]  Physical Exam: Vital Signs BP (!) 107/59 (BP Location: Left Arm, Patient Position: Sitting, Cuff Size: Normal)   Pulse 78   Temp 98.3 F (36.8 C) (Oral)   Ht 5\' 3"  (1.6 m)   Wt 139 lb (63 kg)   SpO2 99%   BMI 24.62 kg/m  General: alert, active, slightly anxious, no acute distress HEENT:normal Neck: supple, full ROM Chest:symmetrical rise and fall Cardiac: regular rate and rhythm, +  2 bilateral radial pulses, cap refill <3 sec Lungs: CTA Musculoskeletal: MAEx4 Neuro: A&O x3 Extremities: 8x4 cm raised and firm area on lateral left upper thigh with slight  discoloration  Assessment: Danaisha Celli is a 48 yo female with a history of  hematoma to left upper leg secondary to trauma.   Plan: She is scheduled for excision of left upper leg fat necrosis, on 06/21/18 with Dr. Marla Roe. Risks, benefits, and alternatives of procedure discussed, questions answered. Discussed need to get constipation under control prior to surgery. Tarae plans to restart miralax. Discussed COVID-19 precautions and pre-procedure testing.   The risks that can be encountered with and after excision of a skin lesion were discussed and include the following but not limited to these: bleeding, infection, delayed healing, anesthesia risks, skin sensation changes, injury to structures including nerves, blood vessels, and muscles which may be temporary or permanent, allergies to tape, suture materials and glues, blood products, topical preparations or injected agents, skin contour irregularities, skin discoloration and swelling, deep vein thrombosis, cardiac and pulmonary complications, pain, which may persist, persistent pain, recurrence of the lesion, poor healing of the incision, possible need for revisional surgery or staged procedures.    Electronically signed by: Alfredo Batty, NP 06/12/2018 4:34 PM

## 2018-06-12 NOTE — Progress Notes (Signed)
History of Present Illness: Margaret Lopez is a 48 y.o.  female  with a history of hematoma to left upper leg. The hematoma developed after her leg was stuck between a boat trailer and a sign. She presents today for pre-operative history and physical for an excision of left upper leg fat necrosis, scheduled for 06/21/18 with Dr. Marla Roe. The area measures 8 x 4 cm. She has had fluid aspirated before. She has some tenderness and  decreased sensation in the area. She has noticed some improvement with alternation of hot and cold applications. She does not like to massage the area because it "feels funny." She has a history of gastric bypass in 2018. She has history of frequent constipation and generally has 1 bowel movement per week. She has taken miralax in the past, but is not currently taking anything for constipation. She is frequently nauseous and takes zofran on a regular basis. She had nausea following her previous surgery. Denies tobacco use. Prefers liquids medications. Her boyfriend will be assisting her on the day of surgery. She is worried about dealing with anxiety in the pre-op area.     Past Medical History: Allergies: Allergies  Allergen Reactions  . Doxazosin Nausea And Vomiting  . Morphine And Related Itching  . Phenergan [Promethazine Hcl]     " knocks out for several hours"  . Aloe Hives  . Penicillins Rash    Has patient had a PCN reaction causing immediate rash, facial/tongue/throat swelling, SOB or lightheadedness with hypotension: No Has patient had a PCN reaction causing severe rash involving mucus membranes or skin necrosis: No Has patient had a PCN reaction that required hospitalization No Has patient had a PCN reaction occurring within the last 10 years: No If all of the above answers are "NO", then may proceed with Cephalosporin use.   . Sulfa Antibiotics Rash    Current Medications:  Current Outpatient Medications:  .  albuterol (PROVENTIL HFA;VENTOLIN HFA)  108 (90 Base) MCG/ACT inhaler, Inhale 1-2 puffs into the lungs every 6 (six) hours as needed for wheezing or shortness of breath., Disp: , Rfl:  .  ALPRAZolam (XANAX) 0.5 MG tablet, Take 1 tablet (0.5 mg total) by mouth 2 (two) times daily as needed for anxiety., Disp: 60 tablet, Rfl: 2 .  cyclobenzaprine (FLEXERIL) 10 MG tablet, Take 0.5-1 tablets (5-10 mg total) by mouth at bedtime as needed. TMJ, Disp: 30 tablet, Rfl: 2 .  Diclofenac Potassium (CAMBIA) 50 MG PACK, Mix 1 pack (50 mg) with 1-2 oz of water and drink. May repeat in 12 hours if needed. Max 2 packs in 24 hours and max 9 packs per month., Disp: 9 each, Rfl: 3 .  divalproex (DEPAKOTE ER) 500 MG 24 hr tablet, Take 4 tablets (2,000 mg total) by mouth daily., Disp: 120 tablet, Rfl: 2 .  EPINEPHrine 0.3 mg/0.3 mL IJ SOAJ injection, Inject 0.3 mg into the muscle daily as needed (for anaphylatic reactions.). , Disp: , Rfl: 1 .  Erenumab-aooe (AIMOVIG) 140 MG/ML SOAJ, Inject 140 mg into the skin every 30 (thirty) days., Disp: 1 pen, Rfl: 11 .  gabapentin (NEURONTIN) 100 MG capsule, Take 1 capsule (100 mg total) by mouth 3 (three) times daily. (Patient not taking: Reported on 05/24/2018), Disp: 90 capsule, Rfl: 11 .  hydrOXYzine (ATARAX/VISTARIL) 25 MG tablet, Take 25 mg by mouth at bedtime as needed for itching. , Disp: , Rfl: 3 .  ipratropium (ATROVENT) 0.06 % nasal spray, Place 2 sprays into both nostrils daily  as needed. Allergies, runny nose., Disp: , Rfl: 4 .  levocetirizine (XYZAL) 5 MG tablet, Take 5 mg by mouth at bedtime. , Disp: , Rfl:  .  lithium carbonate (LITHOBID) 300 MG CR tablet, Take 2 tablets (600 mg total) by mouth every evening., Disp: 60 tablet, Rfl: 2 .  Multiple Vitamins-Minerals (OPURITY BYPASS OPTIMIZED) CHEW, Chew 1 tablet by mouth daily., Disp: , Rfl:  .  naratriptan (AMERGE) 2.5 MG tablet, Take one (1) tablet as needed for headache or migraine. Do not exceed two doses of any triptan in one day., Disp: 10 tablet, Rfl: 6 .   ondansetron (ZOFRAN-ODT) 4 MG disintegrating tablet, Take 4 mg by mouth every 6 (six) hours as needed for nausea or vomiting. , Disp: , Rfl: 0 .  prochlorperazine (COMPAZINE) 10 MG tablet, Take 1 tablet (10 mg total) by mouth 3 (three) times daily as needed for nausea or vomiting (migraine). MAY CAUSE SEDATION., Disp: 30 tablet, Rfl: 3 .  SUMAtriptan Succinate (ZEMBRACE SYMTOUCH) 3 MG/0.5ML SOAJ, Inject 3 mg into the skin every 15 (fifteen) minutes. May repeat again in 2 hours Maximum 4 in one day., Disp: 12 pen, Rfl: 11 .  Ubrogepant (UBRELVY) 50 MG TABS, Take 50 mg by mouth every 2 (two) hours as needed. Max 200mg  a day (Patient not taking: Reported on 05/24/2018), Disp: 10 tablet, Rfl: 6 .  valACYclovir (VALTREX) 500 MG tablet, Take 500 mg by mouth See admin instructions. Takes 500mg  daily for 5 days when she has a flare up., Disp: , Rfl: 3  Past Medical Problems: Past Medical History:  Diagnosis Date  . Anxiety   . Bipolar disorder ([place])   . Depression   . Deviated nasal septum 02/2011  . Generalized anxiety disorder 04/16/2013  . GERD (gastroesophageal reflux disease)   . Headache(784.0)    migraines  . Insomnia   . Nasal turbinate hypertrophy 02/2011   bilat.  . Neuromuscular disorder [facility])    Dr. Jerilynn Mages. Domingo Cocking- does injections around nerve in her head for prevention of migraines - q 3 months   . OSA (obstructive sleep apnea)   . Pneumonia    hx of   . PONV (postoperative nausea and vomiting)   . Seasonal allergies   . Sleep apnea    uses BIPAP nightly; no longer using bipap, cleared from sleep apnea after bypass surgery    Past Surgical History: Past Surgical History:  Procedure Laterality Date  . ABDOMINAL HYSTERECTOMY    . CARPAL TUNNEL RELEASE Right   . DIAGNOSTIC LAPAROSCOPY    . FOOT SURGERY Right    bone removed from great toe   . GASTRIC ROUX-EN-Y N/A 05/16/2016   Procedure: LAPAROSCOPIC ROUX-EN-Y GASTRIC BYPASS WITH UPPER ENDOSCOPY;  Surgeon: Clovis Riley, MD;   [facility];  Service: General;  Laterality: N/A;  . LAPAROSCOPIC VAGINAL HYSTERECTOMY  03/14/2007  . LIPOMA EXCISION Left 08/26/2016   Procedure: EXCISION LIPOMA FROM LEFT UPPER POSTERIOR THIGH;  Surgeon: Clovis Riley, MD;  Location: [place];  Service: General;  Laterality: Left;  . MYRINGOTOMY WITH TUBE PLACEMENT Bilateral 03/02/2015   Procedure: MYRINGOTOMY WITH T-TUBE PLACEMENT;  Surgeon: Leta Baptist, MD;  Location: [place];  Service: ENT;  Laterality: Bilateral;  . NASAL SEPTOPLASTY W/ TURBINOPLASTY  02/22/2011   Procedure: NASAL SEPTOPLASTY WITH TURBINATE REDUCTION;  Surgeon: Ascencion Dike, MD;  Location: [place];  Service: ENT;  Laterality: Bilateral;  . TUBAL LIGATION  12/16/2004  . TYMPANOSTOMY TUBE PLACEMENT  Bilateral 07/21/2017    The patient has had anesthesia or sedation in the past.   The patient HAS had problems with anesthesia: nausea The patient does not have a family history of anesthesia problems.    Social History: Social History   Socioeconomic History  . Marital status: Legally Separated    Spouse name: Not on file  . Number of children: 2  . Years of education: Not on file  . Highest education level: Some college, no degree  Occupational History  . Not on file  Social Needs  . Financial resource strain: Not on file  . Food insecurity:    Worry: Not on file    Inability: Not on file  . Transportation needs:    Medical: Not on file    Non-medical: Not on file  Tobacco Use  . Smoking status: Former Smoker    Packs/day: 0.50    Years: 4.00    Pack years: 2.00    Types: Cigarettes    Last attempt to quit: 11/12/2015    Years since quitting: 2.5  . Smokeless tobacco: Never Used  Substance and Sexual Activity  . Alcohol use: No    Alcohol/week: 0.0 standard drinks    Comment: once a month or less  . Drug use: No  . Sexual activity: Yes    Partners: Male    Birth control/protection: Surgical    Comment: Hysterectomy   Lifestyle  . Physical activity:    Days per week: 5 days    Minutes per session: 40 min  . Stress: Only a little  Relationships  . Social connections:    Talks on phone: Not on file    Gets together: Not on file    Attends religious service: Not on file    Active member of club or organization: Not on file    Attends meetings of clubs or organizations: Not on file    Relationship status: Not on file  . Intimate partner violence:    Fear of current or ex partner: Not on file    Emotionally abused: Not on file    Physically abused: Not on file    Forced sexual activity: Not on file  Other Topics Concern  . Not on file  Social History Narrative   Lives at home with boyfriend   Right handed   Caffeine: 2 cups daily    Family History: Family History  Problem Relation Age of Onset  . ADD / ADHD Sister   . Hypertension Mother   . Sarcoidosis Mother   . Rheum arthritis Mother   . Hypertension Father   . Heart Problems Father   . Alcohol abuse Maternal Grandmother   . Bipolar disorder Maternal Grandmother   . Breast cancer Maternal Grandmother   . Breast cancer Maternal Aunt   . Heart Problems Other        dad's side of family  . Suicidality Neg Hx     Review of Systems: General ROS: anxious Dermatological ROS: negative Cardiovascular ROS: negative ENT ROS: negative Gastrointestinal UMP:[id number]  Physical Exam: Vital Signs BP (!) 107/59 (BP Location: Left Arm, Patient Position: Sitting, Cuff Size: Normal)   Pulse 78   Temp 98.3 F (36.8 C) (Oral)   Ht 5\' 3"  (1.6 m)   Wt 139 lb (63 kg)   SpO2 99%   BMI 24.62 kg/m  General: alert, active, slightly anxious, no acute distress HEENT:normal Neck: supple, full ROM Chest:symmetrical rise and fall Cardiac: regular rate and rhythm, +  2 bilateral radial pulses, cap refill <3 sec Lungs: CTA Musculoskeletal: MAEx4 Neuro: A&O x3 Extremities: 8x4 cm raised and firm area on lateral left upper thigh with slight  discoloration  Assessment: Ellice Boultinghouse is a 48 yo female with a history of  hematoma to left upper leg secondary to trauma.   Plan: She is scheduled for excision of left upper leg fat necrosis, on 06/21/18 with Dr. Marla Roe. Risks, benefits, and alternatives of procedure discussed, questions answered. Discussed need to get constipation under control prior to surgery. [place] plans to restart miralax. Discussed COVID-19 precautions and pre-procedure testing.   The risks that can be encountered with and after excision of a skin lesion were discussed and include the following but not limited to these: bleeding, infection, delayed healing, anesthesia risks, skin sensation changes, injury to structures including nerves, blood vessels, and muscles which may be temporary or permanent, allergies to tape, suture materials and glues, blood products, topical preparations or injected agents, skin contour irregularities, skin discoloration and swelling, deep vein thrombosis, cardiac and pulmonary complications, pain, which may persist, persistent pain, recurrence of the lesion, poor healing of the incision, possible need for revisional surgery or staged procedures.    Electronically signed by: Alfredo Batty, NP 06/12/2018 4:34 PM

## 2018-06-14 ENCOUNTER — Ambulatory Visit: Payer: Self-pay | Admitting: Skilled Nursing Facility1

## 2018-06-14 ENCOUNTER — Encounter (HOSPITAL_BASED_OUTPATIENT_CLINIC_OR_DEPARTMENT_OTHER): Payer: Self-pay | Admitting: *Deleted

## 2018-06-14 ENCOUNTER — Other Ambulatory Visit: Payer: Self-pay

## 2018-06-14 ENCOUNTER — Ambulatory Visit: Payer: BC Managed Care – PPO | Attending: Neurology | Admitting: Physical Therapy

## 2018-06-14 ENCOUNTER — Encounter: Payer: Self-pay | Admitting: Physical Therapy

## 2018-06-14 DIAGNOSIS — G44221 Chronic tension-type headache, intractable: Secondary | ICD-10-CM | POA: Insufficient documentation

## 2018-06-14 DIAGNOSIS — R252 Cramp and spasm: Secondary | ICD-10-CM | POA: Diagnosis not present

## 2018-06-14 DIAGNOSIS — M542 Cervicalgia: Secondary | ICD-10-CM | POA: Diagnosis not present

## 2018-06-14 NOTE — Therapy (Signed)
[place] [address] [place], [place], [address] Phone: [phone]   Fax:  [phone]  Physical Therapy Treatment  Patient Details  Name: Margaret Lopez MRN: [id number] Date of Birth: May 11, 1970 Referring Provider (PT): Jaynee Eagles   Encounter Date: 06/14/2018  PT End of Session - 06/14/18 1128    Visit Number  3    Date for PT Re-Evaluation  07/31/18    PT Start Time  1101    PT Stop Time  1146    PT Time Calculation (min)  45 min    Activity Tolerance  Patient tolerated treatment well    Behavior During Therapy  [facility] for tasks assessed/performed       Past Medical History:  Diagnosis Date  . Anxiety   . Bipolar disorder ([place])   . Depression   . Deviated nasal septum 02/2011  . Generalized anxiety disorder 04/16/2013  . GERD (gastroesophageal reflux disease)   . Headache(784.0)    migraines  . Insomnia   . Nasal turbinate hypertrophy 02/2011   bilat.  . Neuromuscular disorder [facility])    Dr. Jerilynn Mages. Domingo Cocking- does injections around nerve in her head for prevention of migraines - q 3 months   . Pneumonia    hx of   . PONV (postoperative nausea and vomiting)   . Seasonal allergies     Past Surgical History:  Procedure Laterality Date  . ABDOMINAL HYSTERECTOMY    . CARPAL TUNNEL RELEASE Right   . DIAGNOSTIC LAPAROSCOPY    . FOOT SURGERY Right    bone removed from great toe   . GASTRIC ROUX-EN-Y N/A 05/16/2016   Procedure: LAPAROSCOPIC ROUX-EN-Y GASTRIC BYPASS WITH UPPER ENDOSCOPY;  Surgeon: Clovis Riley, MD;  [facility];  Service: General;  Laterality: N/A;  . LAPAROSCOPIC VAGINAL HYSTERECTOMY  03/14/2007  . LIPOMA EXCISION Left 08/26/2016   Procedure: EXCISION LIPOMA FROM LEFT UPPER POSTERIOR THIGH;  Surgeon: Clovis Riley, MD;  Location: [place];  Service: General;  Laterality: Left;  . MYRINGOTOMY WITH TUBE PLACEMENT Bilateral 03/02/2015   Procedure: MYRINGOTOMY WITH T-TUBE PLACEMENT;  Surgeon: Leta Baptist,  MD;  Location: [place];  Service: ENT;  Laterality: Bilateral;  . NASAL SEPTOPLASTY W/ TURBINOPLASTY  02/22/2011   Procedure: NASAL SEPTOPLASTY WITH TURBINATE REDUCTION;  Surgeon: Ascencion Dike, MD;  Location: [place];  Service: ENT;  Laterality: Bilateral;  . TUBAL LIGATION  12/16/2004  . TYMPANOSTOMY TUBE PLACEMENT Bilateral 07/21/2017    There were no vitals filed for this visit.                    [facility] Adult PT Treatment/Exercise - 06/14/18 0001      Modalities   Modalities  Electrical Stimulation;Moist Heat;Ultrasound      Moist Heat Therapy   Number Minutes Moist Heat  15 Minutes    Moist Heat Location  Cervical      Electrical Stimulation   Electrical Stimulation Location  right upper trap and neck area    Electrical Stimulation Action  IFC    Electrical Stimulation Parameters  supine    Electrical Stimulation Goals  Pain      Manual Therapy   Manual Therapy  Soft tissue mobilization;Manual Traction    Soft tissue mobilization  rhomboids, upper traps and the cervical parapsinals    Manual Traction  gentle occipital release with some gentle manual shoulder depression and stretches  PT Short Term Goals - 06/14/18 1131      PT SHORT TERM GOAL #1   Title  independent with initial HEP    Status  Achieved        PT Long Term Goals - 05/31/18 1057      PT LONG TERM GOAL #1   Title  understand proper posture and body mechanics    Period  Weeks    Status  New      PT LONG TERM GOAL #2   Title  report HA frequency decreased 50%    Time  8    Period  Weeks    Status  New      PT LONG TERM GOAL #3   Title  report HA intensity decreased 25%    Time  8    Period  Weeks    Status  New      PT LONG TERM GOAL #4   Title  increase UE strength to 4/5    Time  8    Period  Weeks    Status  New      PT LONG TERM GOAL #5   Title  incresae cervical ROM 25%    Time  8    Period  Weeks    Status   New            Plan - 06/14/18 1129    Clinical Impression Statement  Patient continues to report increased stiffness and HA, she is very tight and tender in the right upper trap, rhomboid and cervical area, we discussed the TMJ that she has and how when concentrating that she tends to clench teeth, we also talked about wearing masks and how if she is not careful that she may be jutting her jaw out against the mask.    PT Next Visit Plan  slowly resume exercises    Consulted and Agree with Plan of Care  Patient       Patient will benefit from skilled therapeutic intervention in order to improve the following deficits and impairments:  Increased muscle spasms, Improper body mechanics, Decreased range of motion, Decreased strength, Postural dysfunction, Pain  Visit Diagnosis: Cervicalgia  Chronic tension-type headache, intractable  Cramp and spasm     Problem List Patient Active Problem List   Diagnosis Date Noted  . Hematoma of leg 03/02/2018  . Chronic migraine without aura, with intractable migraine, so stated, with status migrainosus 06/18/2017  . GAD (generalized anxiety disorder) 04/16/2013  . Bipolar 1 disorder, depressed ([place]) 04/16/2013    Sumner Boast., PT 06/14/2018, 11:31 AM  [address] [place], [place], [address] Phone: [phone]   Fax:  [phone]  Name: Margaret Lopez MRN: [id number] Date of Birth: 1970/08/19

## 2018-06-18 ENCOUNTER — Ambulatory Visit: Payer: BC Managed Care – PPO | Admitting: Physical Therapy

## 2018-06-18 ENCOUNTER — Other Ambulatory Visit (HOSPITAL_COMMUNITY): Payer: BC Managed Care – PPO

## 2018-06-18 ENCOUNTER — Encounter: Payer: Self-pay | Admitting: Physical Therapy

## 2018-06-18 ENCOUNTER — Other Ambulatory Visit: Payer: Self-pay

## 2018-06-18 DIAGNOSIS — R252 Cramp and spasm: Secondary | ICD-10-CM | POA: Diagnosis not present

## 2018-06-18 DIAGNOSIS — G44221 Chronic tension-type headache, intractable: Secondary | ICD-10-CM | POA: Diagnosis not present

## 2018-06-18 DIAGNOSIS — M542 Cervicalgia: Secondary | ICD-10-CM

## 2018-06-18 MED ORDER — OXYCODONE HCL 5 MG/5ML PO SOLN: 5.0000 mg | Freq: Four times a day (QID) | ORAL | 0 refills | Status: AC | PRN

## 2018-06-18 NOTE — Addendum Note (Signed)
Addended by: Wallace Going on: 06/18/2018 02:17 PM   Modules accepted: Orders

## 2018-06-18 NOTE — Therapy (Signed)
[place], [place], [address] Phone: [phone]   Fax:  [phone]  Physical Therapy Treatment  Patient Details  Name: Margaret Lopez MRN: [id number] Date of Birth: 1970-10-15 Referring Provider (PT): Jaynee Eagles   Encounter Date: 06/18/2018  PT End of Session - 06/18/18 1650    Visit Number  4    Date for PT Re-Evaluation  07/31/18    PT Start Time  0938    PT Stop Time  1710    PT Time Calculation (min)  55 min    Activity Tolerance  Patient tolerated treatment well    Behavior During Therapy  [facility] for tasks assessed/performed       Past Medical History:  Diagnosis Date  . Anxiety   . Bipolar disorder ([place])   . Depression   . Deviated nasal septum 02/2011  . Generalized anxiety disorder 04/16/2013  . GERD (gastroesophageal reflux disease)   . Headache(784.0)    migraines  . Insomnia   . Nasal turbinate hypertrophy 02/2011   bilat.  . Neuromuscular disorder [facility])    Dr. Jerilynn Mages. Domingo Cocking- does injections around nerve in her head for prevention of migraines - q 3 months   . Pneumonia    hx of   . PONV (postoperative nausea and vomiting)   . Seasonal allergies     Past Surgical History:  Procedure Laterality Date  . ABDOMINAL HYSTERECTOMY    . CARPAL TUNNEL RELEASE Right   . DIAGNOSTIC LAPAROSCOPY    . FOOT SURGERY Right    bone removed from great toe   . GASTRIC ROUX-EN-Y N/A 05/16/2016   Procedure: LAPAROSCOPIC ROUX-EN-Y GASTRIC BYPASS WITH UPPER ENDOSCOPY;  Surgeon: Clovis Riley, MD;  [facility];  Service: General;  Laterality: N/A;  . LAPAROSCOPIC VAGINAL HYSTERECTOMY  03/14/2007  . LIPOMA EXCISION Left 08/26/2016   Procedure: EXCISION LIPOMA FROM LEFT UPPER POSTERIOR THIGH;  Surgeon: Clovis Riley, MD;  Location: [place];  Service: General;  Laterality: Left;  . MYRINGOTOMY WITH TUBE PLACEMENT Bilateral 03/02/2015   Procedure: MYRINGOTOMY WITH T-TUBE PLACEMENT;  Surgeon: Leta Baptist,  MD;  Location: [place];  Service: ENT;  Laterality: Bilateral;  . NASAL SEPTOPLASTY W/ TURBINOPLASTY  02/22/2011   Procedure: NASAL SEPTOPLASTY WITH TURBINATE REDUCTION;  Surgeon: Ascencion Dike, MD;  Location: [place];  Service: ENT;  Laterality: Bilateral;  . TUBAL LIGATION  12/16/2004  . TYMPANOSTOMY TUBE PLACEMENT Bilateral 07/21/2017    There were no vitals filed for this visit.  Subjective Assessment - 06/18/18 1635    Subjective  Patient reports doing pretty good     Currently in Pain?  Yes    Pain Score  4     Pain Location  Neck                       [facility] Adult PT Treatment/Exercise - 06/18/18 0001      Modalities   Modalities  Electrical Stimulation;Moist Heat;Ultrasound      Moist Heat Therapy   Number Minutes Moist Heat  10 Minutes      Electrical Stimulation   Electrical Stimulation Location  right upper trap and neck area    Electrical Stimulation Action  IFC    Electrical Stimulation Parameters  sitting    Electrical Stimulation Goals  Pain      Manual Therapy   Manual Therapy  Soft tissue mobilization;Manual Traction  Soft tissue mobilization  rhomboids, upper traps and the cervical parapsinals    Manual Traction  gentle occipital release with some gentle manual shoulder depression and stretches               PT Short Term Goals - 06/14/18 1131      PT SHORT TERM GOAL #1   Title  independent with initial HEP    Status  Achieved        PT Long Term Goals - 05/31/18 1057      PT LONG TERM GOAL #1   Title  understand proper posture and body mechanics    Period  Weeks    Status  New      PT LONG TERM GOAL #2   Title  report HA frequency decreased 50%    Time  8    Period  Weeks    Status  New      PT LONG TERM GOAL #3   Title  report HA intensity decreased 25%    Time  8    Period  Weeks    Status  New      PT LONG TERM GOAL #4   Title  increase UE strength to 4/5    Time  8     Period  Weeks    Status  New      PT LONG TERM GOAL #5   Title  incresae cervical ROM 25%    Time  8    Period  Weeks    Status  New            Plan - 06/18/18 1650    Clinical Impression Statement  Patient is very tender and tight in the upper traps and into the rhomboids.  She tolerated the exercises okay, she was very fatigued and weak with the resistance exercises    PT Next Visit Plan  slowly resume exercises, she may have a leg surgery b/n now and the time we see her again so we may have to alter the exercises    Consulted and Agree with Plan of Care  Patient       Patient will benefit from skilled therapeutic intervention in order to improve the following deficits and impairments:  Increased muscle spasms, Improper body mechanics, Decreased range of motion, Decreased strength, Postural dysfunction, Pain  Visit Diagnosis: Cervicalgia  Chronic tension-type headache, intractable  Cramp and spasm     Problem List Patient Active Problem List   Diagnosis Date Noted  . Hematoma of leg 03/02/2018  . Chronic migraine without aura, with intractable migraine, so stated, with status migrainosus 06/18/2017  . GAD (generalized anxiety disorder) 04/16/2013  . Bipolar 1 disorder, depressed ([place]) 04/16/2013    Sumner Boast., PT 06/18/2018, 4:52 PM  [address] [place], [place], [address] Phone: [phone]   Fax:  [phone]  Name: Margaret Lopez MRN: [id number] Date of Birth: 1970-06-07

## 2018-06-19 ENCOUNTER — Other Ambulatory Visit (HOSPITAL_COMMUNITY)
Admission: RE | Admit: 2018-06-19 | Discharge: 2018-06-19 | Disposition: A | Discharge status: Home / Self Care | Payer: BC Managed Care – PPO | Source: Ambulatory Visit | Attending: Plastic Surgery | Admitting: Plastic Surgery

## 2018-06-19 DIAGNOSIS — Z1159 Encounter for screening for other viral diseases: Secondary | ICD-10-CM | POA: Insufficient documentation

## 2018-06-19 DIAGNOSIS — Z01812 Encounter for preprocedural laboratory examination: Secondary | ICD-10-CM | POA: Insufficient documentation

## 2018-06-20 LAB — NOVEL CORONAVIRUS, NAA (HOSP ORDER, SEND-OUT TO REF LAB; TAT 18-24 HRS): SARS-CoV-2, NAA: NOT DETECTED

## 2018-06-21 ENCOUNTER — Ambulatory Visit (HOSPITAL_BASED_OUTPATIENT_CLINIC_OR_DEPARTMENT_OTHER)
Admission: RE | Admit: 2018-06-21 | Discharge: 2018-06-21 | Disposition: A | Discharge status: Home / Self Care | Payer: BC Managed Care – PPO | Attending: Plastic Surgery | Admitting: Plastic Surgery

## 2018-06-21 ENCOUNTER — Ambulatory Visit (HOSPITAL_BASED_OUTPATIENT_CLINIC_OR_DEPARTMENT_OTHER): Payer: BC Managed Care – PPO | Admitting: Anesthesiology

## 2018-06-21 ENCOUNTER — Other Ambulatory Visit: Payer: Self-pay

## 2018-06-21 ENCOUNTER — Encounter (HOSPITAL_BASED_OUTPATIENT_CLINIC_OR_DEPARTMENT_OTHER)
Admission: RE | Disposition: A | Discharge status: Home / Self Care | Payer: Self-pay | Source: Home / Self Care | Attending: Plastic Surgery

## 2018-06-21 ENCOUNTER — Encounter (HOSPITAL_BASED_OUTPATIENT_CLINIC_OR_DEPARTMENT_OTHER): Payer: Self-pay | Admitting: *Deleted

## 2018-06-21 DIAGNOSIS — J45909 Unspecified asthma, uncomplicated: Secondary | ICD-10-CM | POA: Diagnosis not present

## 2018-06-21 DIAGNOSIS — M7989 Other specified soft tissue disorders: Secondary | ICD-10-CM | POA: Insufficient documentation

## 2018-06-21 DIAGNOSIS — Z87891 Personal history of nicotine dependence: Secondary | ICD-10-CM | POA: Diagnosis not present

## 2018-06-21 DIAGNOSIS — F411 Generalized anxiety disorder: Secondary | ICD-10-CM | POA: Insufficient documentation

## 2018-06-21 DIAGNOSIS — Z79899 Other long term (current) drug therapy: Secondary | ICD-10-CM | POA: Diagnosis not present

## 2018-06-21 DIAGNOSIS — G4730 Sleep apnea, unspecified: Secondary | ICD-10-CM | POA: Insufficient documentation

## 2018-06-21 DIAGNOSIS — G4733 Obstructive sleep apnea (adult) (pediatric): Secondary | ICD-10-CM | POA: Diagnosis not present

## 2018-06-21 DIAGNOSIS — F319 Bipolar disorder, unspecified: Secondary | ICD-10-CM | POA: Insufficient documentation

## 2018-06-21 DIAGNOSIS — S7012XA Contusion of left thigh, initial encounter: Secondary | ICD-10-CM | POA: Diagnosis not present

## 2018-06-21 DIAGNOSIS — G43909 Migraine, unspecified, not intractable, without status migrainosus: Secondary | ICD-10-CM | POA: Insufficient documentation

## 2018-06-21 DIAGNOSIS — G43911 Migraine, unspecified, intractable, with status migrainosus: Secondary | ICD-10-CM | POA: Diagnosis not present

## 2018-06-21 DIAGNOSIS — M799 Soft tissue disorder, unspecified: Secondary | ICD-10-CM | POA: Diagnosis not present

## 2018-06-21 DIAGNOSIS — Z9884 Bariatric surgery status: Secondary | ICD-10-CM | POA: Insufficient documentation

## 2018-06-21 DIAGNOSIS — S8012XA Contusion of left lower leg, initial encounter: Secondary | ICD-10-CM | POA: Diagnosis not present

## 2018-06-21 HISTORY — DX: Unspecified asthma, uncomplicated: J45.909

## 2018-06-21 HISTORY — PX: EXCISION MASS LOWER EXTREMETIES: SHX6705

## 2018-06-21 SURGERY — EXCISION MASS LOWER EXTREMITIES
Anesthesia: General | Site: Leg Upper | Laterality: Left

## 2018-06-21 MED ORDER — CIPROFLOXACIN IN D5W 400 MG/200ML IV SOLN
INTRAVENOUS | Status: AC
  Filled 2018-06-21: qty 200

## 2018-06-21 MED ORDER — OXYCODONE HCL 5 MG/5ML PO SOLN: 5.0000 mg | Freq: Once | ORAL | Status: DC | PRN

## 2018-06-21 MED ORDER — DEXAMETHASONE SODIUM PHOSPHATE 10 MG/ML IJ SOLN
INTRAMUSCULAR | Status: AC
  Filled 2018-06-21: qty 1

## 2018-06-21 MED ORDER — FENTANYL CITRATE (PF) 100 MCG/2ML IJ SOLN
INTRAMUSCULAR | Status: AC
  Filled 2018-06-21: qty 2

## 2018-06-21 MED ORDER — LIDOCAINE-EPINEPHRINE 1 %-1:100000 IJ SOLN
INTRAMUSCULAR | Status: AC
  Filled 2018-06-21: qty 1

## 2018-06-21 MED ORDER — 0.9 % SODIUM CHLORIDE (POUR BTL) OPTIME
TOPICAL | Status: DC | PRN
  Administered 2018-06-21: 08:00:00 1000 mL

## 2018-06-21 MED ORDER — LIDOCAINE 2% (20 MG/ML) 5 ML SYRINGE
INTRAMUSCULAR | Status: AC
  Filled 2018-06-21: qty 5

## 2018-06-21 MED ORDER — SCOPOLAMINE 1 MG/3DAYS TD PT72: 1.0000 | MEDICATED_PATCH | Freq: Once | TRANSDERMAL | Status: DC | PRN

## 2018-06-21 MED ORDER — ONDANSETRON HCL 4 MG/2ML IJ SOLN
INTRAMUSCULAR | Status: AC
  Filled 2018-06-21: qty 2

## 2018-06-21 MED ORDER — SODIUM CHLORIDE 0.9% FLUSH: 3.0000 mL | INTRAVENOUS | Status: DC | PRN

## 2018-06-21 MED ORDER — LIDOCAINE HCL (CARDIAC) PF 100 MG/5ML IV SOSY
PREFILLED_SYRINGE | INTRAVENOUS | Status: DC | PRN
  Administered 2018-06-21: 50 mg via INTRAVENOUS

## 2018-06-21 MED ORDER — ACETAMINOPHEN 650 MG RE SUPP: 650.0000 mg | RECTAL | Status: DC | PRN

## 2018-06-21 MED ORDER — ONDANSETRON HCL 4 MG/2ML IJ SOLN: 4.0000 mg | Freq: Once | INTRAMUSCULAR | Status: DC | PRN

## 2018-06-21 MED ORDER — SODIUM CHLORIDE 0.9% FLUSH: 3.0000 mL | Freq: Two times a day (BID) | INTRAVENOUS | Status: DC

## 2018-06-21 MED ORDER — OXYCODONE HCL 5 MG PO TABS: 5.0000 mg | ORAL_TABLET | Freq: Once | ORAL | Status: DC | PRN

## 2018-06-21 MED ORDER — FENTANYL CITRATE (PF) 100 MCG/2ML IJ SOLN
25.0000 ug | INTRAMUSCULAR | Status: DC | PRN
  Administered 2018-06-21: 25 ug via INTRAVENOUS
  Administered 2018-06-21: 50 ug via INTRAVENOUS

## 2018-06-21 MED ORDER — SUCCINYLCHOLINE CHLORIDE 200 MG/10ML IV SOSY
PREFILLED_SYRINGE | INTRAVENOUS | Status: AC
  Filled 2018-06-21: qty 10

## 2018-06-21 MED ORDER — BUPIVACAINE-EPINEPHRINE (PF) 0.5% -1:200000 IJ SOLN
INTRAMUSCULAR | Status: AC
  Filled 2018-06-21: qty 30

## 2018-06-21 MED ORDER — BUPIVACAINE-EPINEPHRINE 0.25% -1:200000 IJ SOLN
INTRAMUSCULAR | Status: DC | PRN
  Administered 2018-06-21: 20 mL

## 2018-06-21 MED ORDER — PROPOFOL 10 MG/ML IV BOLUS
INTRAVENOUS | Status: DC | PRN
  Administered 2018-06-21: 150 mg via INTRAVENOUS

## 2018-06-21 MED ORDER — LACTATED RINGERS IV SOLN
INTRAVENOUS | Status: DC
  Administered 2018-06-21 (×2): via INTRAVENOUS

## 2018-06-21 MED ORDER — MIDAZOLAM HCL 2 MG/2ML IJ SOLN
1.0000 mg | INTRAMUSCULAR | Status: DC | PRN
  Administered 2018-06-21: 2 mg via INTRAVENOUS

## 2018-06-21 MED ORDER — DEXAMETHASONE SODIUM PHOSPHATE 4 MG/ML IJ SOLN
INTRAMUSCULAR | Status: DC | PRN
  Administered 2018-06-21: 10 mg via INTRAVENOUS

## 2018-06-21 MED ORDER — OXYCODONE HCL 5 MG PO TABS: 5.0000 mg | ORAL_TABLET | ORAL | Status: DC | PRN

## 2018-06-21 MED ORDER — CIPROFLOXACIN IN D5W 400 MG/200ML IV SOLN
400.0000 mg | INTRAVENOUS | Status: AC
  Administered 2018-06-21 (×2): 400 mg via INTRAVENOUS

## 2018-06-21 MED ORDER — PHENYLEPHRINE 40 MCG/ML (10ML) SYRINGE FOR IV PUSH (FOR BLOOD PRESSURE SUPPORT)
PREFILLED_SYRINGE | INTRAVENOUS | Status: AC
  Filled 2018-06-21: qty 10

## 2018-06-21 MED ORDER — SODIUM CHLORIDE 0.9 % IV SOLN: 250.0000 mL | INTRAVENOUS | Status: DC | PRN

## 2018-06-21 MED ORDER — FENTANYL CITRATE (PF) 100 MCG/2ML IJ SOLN
50.0000 ug | INTRAMUSCULAR | Status: DC | PRN
  Administered 2018-06-21: 100 ug via INTRAVENOUS

## 2018-06-21 MED ORDER — EPHEDRINE 5 MG/ML INJ
INTRAVENOUS | Status: AC
  Filled 2018-06-21: qty 10

## 2018-06-21 MED ORDER — BACITRACIN ZINC 500 UNIT/GM EX OINT
TOPICAL_OINTMENT | CUTANEOUS | Status: AC
  Filled 2018-06-21: qty 28.35

## 2018-06-21 MED ORDER — ACETAMINOPHEN 325 MG PO TABS: 650.0000 mg | ORAL_TABLET | ORAL | Status: DC | PRN

## 2018-06-21 MED ORDER — PROPOFOL 500 MG/50ML IV EMUL
INTRAVENOUS | Status: AC
  Filled 2018-06-21: qty 50

## 2018-06-21 MED ORDER — MIDAZOLAM HCL 2 MG/2ML IJ SOLN
INTRAMUSCULAR | Status: AC
  Filled 2018-06-21: qty 2

## 2018-06-21 SURGICAL SUPPLY — 68 items
ADH SKN CLS APL DERMABOND .7 (GAUZE/BANDAGES/DRESSINGS) ×1
APL PRP STRL LF DISP 70% ISPRP (MISCELLANEOUS) ×1
APL SKNCLS STERI-STRIP NONHPOA (GAUZE/BANDAGES/DRESSINGS)
BENZOIN TINCTURE PRP APPL 2/3 (GAUZE/BANDAGES/DRESSINGS) IMPLANT
BLADE CLIPPER SURG (BLADE) IMPLANT
BLADE SURG 15 STRL LF DISP TIS (BLADE) ×1 IMPLANT
BLADE SURG 15 STRL SS (BLADE) ×2
BNDG CONFORM 2 STRL LF (GAUZE/BANDAGES/DRESSINGS) IMPLANT
BNDG ELASTIC 2X5.8 VLCR STR LF (GAUZE/BANDAGES/DRESSINGS) IMPLANT
CANISTER SUCT 1200ML W/VALVE (MISCELLANEOUS) ×1 IMPLANT
CHLORAPREP W/TINT 26 (MISCELLANEOUS) ×1 IMPLANT
CLEANER CAUTERY TIP 5X5 PAD (MISCELLANEOUS) IMPLANT
CLSR STERI-STRIP ANTIMIC 1/2X4 (GAUZE/BANDAGES/DRESSINGS) ×1 IMPLANT
CORD BIPOLAR FORCEPS 12FT (ELECTRODE) IMPLANT
COVER BACK TABLE REUSABLE LG (DRAPES) ×2 IMPLANT
COVER MAYO STAND REUSABLE (DRAPES) ×2 IMPLANT
COVER WAND RF STERILE (DRAPES) IMPLANT
DECANTER SPIKE VIAL GLASS SM (MISCELLANEOUS) ×2 IMPLANT
DERMABOND ADVANCED (GAUZE/BANDAGES/DRESSINGS) ×1
DERMABOND ADVANCED .7 DNX12 (GAUZE/BANDAGES/DRESSINGS) IMPLANT
DRAPE LAPAROTOMY 100X72 PEDS (DRAPES) ×1 IMPLANT
DRAPE U-SHAPE 76X120 STRL (DRAPES) ×2 IMPLANT
DRSG MEPILEX BORDER 4X4 (GAUZE/BANDAGES/DRESSINGS) ×1 IMPLANT
DRSG TEGADERM 2-3/8X2-3/4 SM (GAUZE/BANDAGES/DRESSINGS) IMPLANT
DRSG TEGADERM 4X4.75 (GAUZE/BANDAGES/DRESSINGS) IMPLANT
ELECT COATED BLADE 2.86 ST (ELECTRODE) IMPLANT
ELECT NDL BLADE 2-5/6 (NEEDLE) IMPLANT
ELECT NEEDLE BLADE 2-5/6 (NEEDLE) IMPLANT
ELECT REM PT RETURN 9FT ADLT (ELECTROSURGICAL) ×2
ELECTRODE REM PT RTRN 9FT ADLT (ELECTROSURGICAL) IMPLANT
GAUZE SPONGE 4X4 12PLY STRL LF (GAUZE/BANDAGES/DRESSINGS) IMPLANT
GLOVE BIO SURGEON STRL SZ 6.5 (GLOVE) ×4 IMPLANT
GOWN STRL REUS W/ TWL LRG LVL3 (GOWN DISPOSABLE) ×2 IMPLANT
GOWN STRL REUS W/TWL LRG LVL3 (GOWN DISPOSABLE) ×4
NDL HYPO 30GX1 BEV (NEEDLE) IMPLANT
NDL PRECISIONGLIDE 27X1.5 (NEEDLE) ×1 IMPLANT
NEEDLE HYPO 30GX1 BEV (NEEDLE) ×2 IMPLANT
NEEDLE PRECISIONGLIDE 27X1.5 (NEEDLE) ×2 IMPLANT
NS IRRIG 1000ML POUR BTL (IV SOLUTION) IMPLANT
PACK BASIN DAY SURGERY FS (CUSTOM PROCEDURE TRAY) ×2 IMPLANT
PAD CLEANER CAUTERY TIP 5X5 (MISCELLANEOUS)
PENCIL BUTTON HOLSTER BLD 10FT (ELECTRODE) ×1 IMPLANT
RUBBERBAND STERILE (MISCELLANEOUS) IMPLANT
SHEET MEDIUM DRAPE 40X70 STRL (DRAPES) IMPLANT
SLEEVE SCD COMPRESS KNEE MED (MISCELLANEOUS) ×1 IMPLANT
SPONGE GAUZE 2X2 8PLY STRL LF (GAUZE/BANDAGES/DRESSINGS) IMPLANT
STRIP CLOSURE SKIN 1/2X4 (GAUZE/BANDAGES/DRESSINGS) IMPLANT
SUCTION FRAZIER HANDLE 10FR (MISCELLANEOUS) ×1
SUCTION TUBE FRAZIER 10FR DISP (MISCELLANEOUS) IMPLANT
SUT MNCRL 6-0 UNDY P1 1X18 (SUTURE) IMPLANT
SUT MNCRL AB 3-0 PS2 18 (SUTURE) ×1 IMPLANT
SUT MNCRL AB 4-0 PS2 18 (SUTURE) IMPLANT
SUT MON AB 5-0 P3 18 (SUTURE) IMPLANT
SUT MON AB 5-0 PS2 18 (SUTURE) IMPLANT
SUT MONOCRYL 6-0 P1 1X18 (SUTURE)
SUT PROLENE 5 0 P 3 (SUTURE) IMPLANT
SUT PROLENE 5 0 PS 2 (SUTURE) IMPLANT
SUT PROLENE 6 0 P 1 18 (SUTURE) IMPLANT
SUT VIC AB 5-0 P-3 18X BRD (SUTURE) IMPLANT
SUT VIC AB 5-0 P3 18 (SUTURE)
SUT VIC AB 5-0 PS2 18 (SUTURE) ×1 IMPLANT
SUT VICRYL 4-0 PS2 18IN ABS (SUTURE) ×1 IMPLANT
SYR BULB 3OZ (MISCELLANEOUS) ×1 IMPLANT
SYR CONTROL 10ML LL (SYRINGE) ×2 IMPLANT
TOWEL GREEN STERILE FF (TOWEL DISPOSABLE) ×2 IMPLANT
TRAY DSU PREP LF (CUSTOM PROCEDURE TRAY) ×2 IMPLANT
TUBE CONNECTING 20X1/4 (TUBING) IMPLANT
YANKAUER SUCT BULB TIP NO VENT (SUCTIONS) ×1 IMPLANT

## 2018-06-21 NOTE — Interval H&P Note (Signed)
History and Physical Interval Note:  06/21/2018 7:23 AM  Margaret Lopez  has presented today for surgery, with the diagnosis of hematoma of leg.  The various methods of treatment have been discussed with the patient and family. After consideration of risks, benefits and other options for treatment, the patient has consented to  Procedure(s): Excision of left upper leg fat necrosis (Left) as a surgical intervention.  The patient's history has been reviewed, patient examined, no change in status, stable for surgery.  I have reviewed the patient's chart and labs.  Questions were answered to the patient's satisfaction.     Loel Lofty Dillingham

## 2018-06-21 NOTE — Anesthesia Procedure Notes (Signed)
Procedure Name: LMA Insertion Date/Time: 06/21/2018 7:38 AM Performed by: Willa Frater, CRNA Pre-anesthesia Checklist: Patient identified, Emergency Drugs available, Suction available and Patient being monitored Patient Re-evaluated:Patient Re-evaluated prior to induction Oxygen Delivery Method: Circle system utilized Preoxygenation: Pre-oxygenation with 100% oxygen Induction Type: IV induction Ventilation: Mask ventilation without difficulty LMA: LMA inserted LMA Size: 4.0 Number of attempts: 1 Airway Equipment and Method: Bite block Placement Confirmation: positive ETCO2 Tube secured with: Tape Dental Injury: Teeth and Oropharynx as per pre-operative assessment

## 2018-06-21 NOTE — Transfer of Care (Signed)
Immediate Anesthesia Transfer of Care Note  Patient: JURY CASERTA  Procedure(s) Performed: Excision of left upper leg fat necrosis (Left Leg Upper)  Patient Location: PACU  Anesthesia Type:General  Level of Consciousness: awake, alert  and drowsy  Airway & Oxygen Therapy: Patient Spontanous Breathing and Patient connected to nasal cannula oxygen  Post-op Assessment: Report given to RN  Post vital signs: Reviewed and stable  Last Vitals:  Vitals Value Taken Time  BP    Temp    Pulse 113 06/21/18 0858  Resp    SpO2 100 % 06/21/18 0858  Vitals shown include unvalidated device data.  Last Pain:  Vitals:   06/21/18 0657  TempSrc: Oral  PainSc: 0-No pain         Complications: No apparent anesthesia complications

## 2018-06-21 NOTE — Anesthesia Preprocedure Evaluation (Addendum)
Anesthesia Evaluation  Patient identified by MRN, date of birth, ID band Patient awake    Reviewed: Allergy & Precautions, NPO status , Patient's Chart, lab work & pertinent test results  History of Anesthesia Complications (+) PONVNegative for: history of anesthetic complications  Airway Mallampati: II  TM Distance: >3 FB Neck ROM: Full    Dental no notable dental hx. (+) Teeth Intact   Pulmonary asthma , former smoker,    Pulmonary exam normal        Cardiovascular negative cardio ROS Normal cardiovascular exam     Neuro/Psych  Headaches, PSYCHIATRIC DISORDERS Anxiety Depression Bipolar Disorder    GI/Hepatic Neg liver ROS, GERD  ,  Endo/Other  negative endocrine ROS  Renal/GU negative Renal ROS  negative genitourinary   Musculoskeletal negative musculoskeletal ROS (+)   Abdominal   Peds  Hematology negative hematology ROS (+)   Anesthesia Other Findings   Reproductive/Obstetrics                            Anesthesia Physical Anesthesia Plan  ASA: II  Anesthesia Plan: General   Post-op Pain Management:    Induction: Intravenous  PONV Risk Score and Plan: 4 or greater and Ondansetron, Dexamethasone, Midazolam, Treatment may vary due to age or medical condition and Scopolamine patch - Pre-op  Airway Management Planned: LMA and Oral ETT  Additional Equipment: None  Intra-op Plan:   Post-operative Plan: Extubation in OR  Informed Consent: I have reviewed the patients History and Physical, chart, labs and discussed the procedure including the risks, benefits and alternatives for the proposed anesthesia with the patient or authorized representative who has indicated his/her understanding and acceptance.     Dental advisory given  Plan Discussed with:   Anesthesia Plan Comments:       Anesthesia Quick Evaluation

## 2018-06-21 NOTE — Discharge Instructions (Signed)
May shower tomorrow. Ice to area for today every 30 min.    Post Anesthesia Home Care Instructions  Activity: Get plenty of rest for the remainder of the day. A responsible individual must stay with you for 24 hours following the procedure.  For the next 24 hours, DO NOT: -Drive a car -Paediatric nurse -Drink alcoholic beverages -Take any medication unless instructed by your physician -Make any legal decisions or sign important papers.  Meals: Start with liquid foods such as gelatin or soup. Progress to regular foods as tolerated. Avoid greasy, spicy, heavy foods. If nausea and/or vomiting occur, drink only clear liquids until the nausea and/or vomiting subsides. Call your physician if vomiting continues.  Special Instructions/Symptoms: Your throat may feel dry or sore from the anesthesia or the breathing tube placed in your throat during surgery. If this causes discomfort, gargle with warm salt water. The discomfort should disappear within 24 hours.  If you had a scopolamine patch placed behind your ear for the management of post- operative nausea and/or vomiting:  1. The medication in the patch is effective for 72 hours, after which it should be removed.  Wrap patch in a tissue and discard in the trash. Wash hands thoroughly with soap and water. 2. You may remove the patch earlier than 72 hours if you experience unpleasant side effects which may include dry mouth, dizziness or visual disturbances. 3. Avoid touching the patch. Wash your hands with soap and water after contact with the patch.      Call your surgeon if you experience:   1.  Fever over 101.0. 2.  Inability to urinate. 3.  Nausea and/or vomiting. 4.  Extreme swelling or bruising at the surgical site. 5.  Continued bleeding from the incision. 6.  Increased pain, redness or drainage from the incision. 7.  Problems related to your pain medication. 8.  Any problems and/or concerns

## 2018-06-21 NOTE — Op Note (Signed)
DATE OF OPERATION: 06/21/2018  LOCATION: [facility]  PREOPERATIVE DIAGNOSIS: Left thigh hematoma / fat necrosis  POSTOPERATIVE DIAGNOSIS: Same  PROCEDURE: Excision of left thigh fat necrosis 2.5 x 7 cm  SURGEON: Diontre Harps Sanger Zalman Hull, DO  ASSISTANT: Bonita Cox, RNFA  EBL: none  CONDITION: Stable  COMPLICATIONS: None  INDICATION: The patient, Margaret Lopez, is a 48 y.o. female born on [birth year], is here for treatment of a left thigh hematoma / fat necrosis.   PROCEDURE DETAILS:  The patient was seen prior to surgery and marked.  The IV antibiotics were given. The patient was taken to the operating room and given a general anesthetic. A standard time out was performed and all information was confirmed by those in the room. SCD was placed on the right leg.   The area was prepped and draped.  Local was injected at the side for a 2 cm area.  The #10 blade was used to make an incision.  The bovie was used to dissect to the mass.  The 2.5 x 7 cm mass was located and appeared to be fat necrosis.  It was excised.  Hemostasis was achieved with electrocautery.  The deep layers were closed with the 4-0 Monocryl followed by a running subcuticular 4-0 Monocryl.  Derma bond and steri strips were applied.  The patient was allowed to wake up and taken to recovery room in stable condition at the end of the case. The family was notified at the end of the case.   The RNFA assisted throughout the case.  The RNFA was essential in retraction and counter traction when needed to make the case progress smoothly.  This retraction and assistance made it possible to see the tissue plans for the procedure.  The assistance was needed for blood control, tissue re-approximation and assisted with closure of the incision site.

## 2018-06-21 NOTE — Anesthesia Postprocedure Evaluation (Signed)
Anesthesia Post Note  Patient: Margaret Lopez  Procedure(s) Performed: Excision of left upper leg fat necrosis (Left Leg Upper)     Patient location during evaluation: PACU Anesthesia Type: General Level of consciousness: awake and alert Pain management: pain level controlled Vital Signs Assessment: post-procedure vital signs reviewed and stable Respiratory status: spontaneous breathing, nonlabored ventilation and respiratory function stable Cardiovascular status: blood pressure returned to baseline and stable Postop Assessment: no apparent nausea or vomiting Anesthetic complications: no    Last Vitals:  Vitals:   06/21/18 0930 06/21/18 0945  BP: (!) 108/58 (!) 109/58  Pulse: 73 71  Resp: 11 13  Temp:    SpO2: 100% 100%    Last Pain:  Vitals:   06/21/18 0945  TempSrc:   PainSc: 7                  Lidia Collum

## 2018-06-22 ENCOUNTER — Encounter (HOSPITAL_BASED_OUTPATIENT_CLINIC_OR_DEPARTMENT_OTHER): Payer: Self-pay | Admitting: Plastic Surgery

## 2018-06-25 ENCOUNTER — Telehealth: Payer: Self-pay | Admitting: Plastic Surgery

## 2018-06-25 MED ORDER — OXYCODONE-ACETAMINOPHEN 5-325 MG/5ML PO SOLN: 5.0000 mL | Freq: Three times a day (TID) | ORAL | 0 refills | Status: AC | PRN

## 2018-06-25 MED ORDER — OXYCODONE HCL 10 MG/0.5ML PO CONC: 5.0000 mL | Freq: Three times a day (TID) | ORAL | 0 refills | Status: DC | PRN

## 2018-06-25 MED ORDER — HYDROCODONE-ACETAMINOPHEN 5-325 MG PO TABS: 1.0000 | ORAL_TABLET | Freq: Four times a day (QID) | ORAL | 0 refills | Status: AC | PRN

## 2018-06-25 NOTE — Telephone Encounter (Signed)
Pain from surgery.

## 2018-06-25 NOTE — Telephone Encounter (Signed)
Patient states she only has one dose of oxycodone left and feels that she needs another prescription of this before her appointment tomorrow as she is still experiencing pain. Contact: [phone].

## 2018-06-25 NOTE — Telephone Encounter (Signed)
Prescription not available

## 2018-06-25 NOTE — Telephone Encounter (Addendum)
Received a call from the pharmacy to let us know that the prescription that was sent in is not correct.  Informed Dr. Marla Roe of this and she stated that she was having trouble finding it in Epic the way it was written.  She stated to let the patient know that she is having trouble finding it but she will try sending it again.  If she is unable to find it in Epic have the patient to take Tylenol or Motrin for the pain which every one helps.  Called and informed the patient of what Dr. Marla Roe said and she stated that Tylenol and Motrin will not help her.  I informed her that Dr. Marla Roe is trying to send it in but sometimes it's hard trying to find things in Epic.  She said her doctor upstairs gives it to her and they have no problem putting it in Epic.  She stated if Dr. Marla Roe can not find it she can sent the prescription in pill form and she will figure out how to take it.  Informed her that I will try to contact Dr. Marla Roe again but she is at surgery.  Patient stated that she feels she is not getting any help from [name].  She said that I could reach Dr. Marla Roe better than she could reach her.  She needs that medication before tomorrow.  Was able to reach Dr. Perfecto Kingdom again and informed her that the pharmacy called back to let us know that the prescription was still not send in correctly.  Also informed her of what the patient said.  Dr. Marla Roe said she will call in the prescription in pill form,but the patient can use Tylenol and Motrin if needed.  Called the patient back and informed her of what Dr. Marla Roe said and she stated thanks for her trying.//AB/CMA

## 2018-06-25 NOTE — Telephone Encounter (Signed)
Prescription has been approved and refilled by Dr. Dillingham.//AB/CMA

## 2018-06-26 ENCOUNTER — Other Ambulatory Visit: Payer: Self-pay

## 2018-06-26 ENCOUNTER — Ambulatory Visit (INDEPENDENT_AMBULATORY_CARE_PROVIDER_SITE_OTHER): Payer: BC Managed Care – PPO | Admitting: Nurse Practitioner

## 2018-06-26 ENCOUNTER — Encounter: Payer: Self-pay | Admitting: Plastic Surgery

## 2018-06-26 VITALS — BP 127/56 | HR 57 | Temp 97.7°F | Ht 63.0 in | Wt 143.0 lb

## 2018-06-26 DIAGNOSIS — S8012XA Contusion of left lower leg, initial encounter: Secondary | ICD-10-CM

## 2018-06-26 NOTE — Progress Notes (Signed)
Patient ID: LEEANNA SLABY, female    DOB: [birth year], 48 y.o.   MRN: [id number]   Chief Complaint  Patient presents with  . Post-op Follow-up    excision of (L) upper leg fat necrosis    Zelene Barga is a 48 yo female POD #5 s/p excision of left upper leg necrosis. She is having pain at the site. She states it feels like a "zapping" nerve pain. She called the office yesterday requesting pain medication. A prescription for norco was sent to her pharmacy. She has some trouble walking and sleeping.   Review of Systems  Constitutional: Negative.   HENT: Negative.   Respiratory: Negative.   Cardiovascular: Negative.   Gastrointestinal: Negative.   Genitourinary: Negative.   Musculoskeletal:       Limping  Skin:       Bruising at left leg wound  Neurological:       Nerve pain     Past Medical History:  Diagnosis Date  . Anxiety   . Asthma   . Bipolar disorder ([place])   . Depression   . Deviated nasal septum 02/2011  . Generalized anxiety disorder 04/16/2013  . GERD (gastroesophageal reflux disease)   . Headache(784.0)    migraines  . Insomnia   . Nasal turbinate hypertrophy 02/2011   bilat.  . Neuromuscular disorder [facility])    Dr. Jerilynn Mages. Domingo Cocking- does injections around nerve in her head for prevention of migraines - q 3 months   . Pneumonia    hx of   . PONV (postoperative nausea and vomiting)   . Seasonal allergies     Past Surgical History:  Procedure Laterality Date  . ABDOMINAL HYSTERECTOMY    . CARPAL TUNNEL RELEASE Right   . DIAGNOSTIC LAPAROSCOPY    . EXCISION MASS LOWER EXTREMETIES Left 06/21/2018   Procedure: Excision of left upper leg fat necrosis;  Surgeon: Wallace Going, DO;  Location: [place];  Service: Plastics;  Laterality: Left;  . FOOT SURGERY Right    bone removed from great toe   . GASTRIC ROUX-EN-Y N/A 05/16/2016   Procedure: LAPAROSCOPIC ROUX-EN-Y GASTRIC BYPASS WITH UPPER ENDOSCOPY;  Surgeon: Clovis Riley, MD;  [facility];  Service: General;  Laterality: N/A;  . LAPAROSCOPIC VAGINAL HYSTERECTOMY  03/14/2007  . LIPOMA EXCISION Left 08/26/2016   Procedure: EXCISION LIPOMA FROM LEFT UPPER POSTERIOR THIGH;  Surgeon: Clovis Riley, MD;  Location: [place];  Service: General;  Laterality: Left;  . MYRINGOTOMY WITH TUBE PLACEMENT Bilateral 03/02/2015   Procedure: MYRINGOTOMY WITH T-TUBE PLACEMENT;  Surgeon: Leta Baptist, MD;  Location: [place];  Service: ENT;  Laterality: Bilateral;  . NASAL SEPTOPLASTY W/ TURBINOPLASTY  02/22/2011   Procedure: NASAL SEPTOPLASTY WITH TURBINATE REDUCTION;  Surgeon: Ascencion Dike, MD;  Location: [place];  Service: ENT;  Laterality: Bilateral;  . TUBAL LIGATION  12/16/2004  . TYMPANOSTOMY TUBE PLACEMENT Bilateral 07/21/2017      Current Outpatient Medications:  .  albuterol (PROVENTIL HFA;VENTOLIN HFA) 108 (90 Base) MCG/ACT inhaler, Inhale 1-2 puffs into the lungs every 6 (six) hours as needed for wheezing or shortness of breath., Disp: , Rfl:  .  ALPRAZolam (XANAX) 0.5 MG tablet, Take 1 tablet (0.5 mg total) by mouth 2 (two) times daily as needed for anxiety., Disp: 60 tablet, Rfl: 2 .  cyclobenzaprine (FLEXERIL) 10 MG tablet, Take 0.5-1 tablets (5-10 mg total) by mouth at bedtime as needed. TMJ, Disp: 30  tablet, Rfl: 2 .  Diclofenac Potassium (CAMBIA) 50 MG PACK, Mix 1 pack (50 mg) with 1-2 oz of water and drink. May repeat in 12 hours if needed. Max 2 packs in 24 hours and max 9 packs per month., Disp: 9 each, Rfl: 3 .  divalproex (DEPAKOTE ER) 500 MG 24 hr tablet, Take 4 tablets (2,000 mg total) by mouth daily., Disp: 120 tablet, Rfl: 2 .  EPINEPHrine 0.3 mg/0.3 mL IJ SOAJ injection, Inject 0.3 mg into the muscle daily as needed (for anaphylatic reactions.). , Disp: , Rfl: 1 .  Erenumab-aooe (AIMOVIG) 140 MG/ML SOAJ, Inject 140 mg into the skin every 30 (thirty) days., Disp: 1 pen, Rfl: 11 .  gabapentin (NEURONTIN) 100 MG capsule, Take 1 capsule (100 mg  total) by mouth 3 (three) times daily., Disp: 90 capsule, Rfl: 11 .  HYDROcodone-acetaminophen (NORCO) 5-325 MG tablet, Take 1 tablet by mouth every 6 (six) hours as needed for up to 5 days for moderate pain or severe pain., Disp: 20 tablet, Rfl: 0 .  hydrOXYzine (ATARAX/VISTARIL) 25 MG tablet, Take 25 mg by mouth at bedtime as needed for itching. , Disp: , Rfl: 3 .  ipratropium (ATROVENT) 0.06 % nasal spray, Place 2 sprays into both nostrils daily as needed. Allergies, runny nose., Disp: , Rfl: 4 .  levocetirizine (XYZAL) 5 MG tablet, Take 5 mg by mouth at bedtime. , Disp: , Rfl:  .  lithium carbonate (LITHOBID) 300 MG CR tablet, Take 2 tablets (600 mg total) by mouth every evening., Disp: 60 tablet, Rfl: 2 .  Multiple Vitamins-Minerals (OPURITY BYPASS OPTIMIZED) CHEW, Chew 1 tablet by mouth daily., Disp: , Rfl:  .  naratriptan (AMERGE) 2.5 MG tablet, Take one (1) tablet as needed for headache or migraine. Do not exceed two doses of any triptan in one day., Disp: 10 tablet, Rfl: 6 .  ondansetron (ZOFRAN-ODT) 4 MG disintegrating tablet, Take 4 mg by mouth every 6 (six) hours as needed for nausea or vomiting. , Disp: , Rfl: 0 .  oxyCODONE-acetaminophen (ROXICET) 5-325 MG/5ML solution, Take 5 mLs by mouth every 8 (eight) hours as needed for up to 3 days for severe pain., Disp: 45 mL, Rfl: 0 .  prochlorperazine (COMPAZINE) 10 MG tablet, Take 1 tablet (10 mg total) by mouth 3 (three) times daily as needed for nausea or vomiting (migraine). MAY CAUSE SEDATION., Disp: 30 tablet, Rfl: 3 .  SUMAtriptan Succinate (ZEMBRACE SYMTOUCH) 3 MG/0.5ML SOAJ, Inject 3 mg into the skin every 15 (fifteen) minutes. May repeat again in 2 hours Maximum 4 in one day., Disp: 12 pen, Rfl: 11 .  valACYclovir (VALTREX) 500 MG tablet, Take 500 mg by mouth See admin instructions. Takes 500mg  daily for 5 days when she has a flare up., Disp: , Rfl: 3   Objective:   Vitals:   06/26/18 1546  BP: (!) 127/56  Pulse: (!) 57  Temp:  97.7 F (36.5 C)  SpO2: 99%    Physical Exam  General: alert, calm, no acute distress HEENT:normal Neck: supple, full ROM Chest: symmetrical rise and fall Lungs: unlabored breathing Musculoskeletal: MAEx4 Neuro: A&O x3, calm, cooperative, steady gait Extremities: 13.5 x 8 cm area of fluctuance at left leg wound Skin: left leg incision covered with steri-strips, mild ecchymosis   Assessment & Plan:  Jasleen Riepe is a 48 yo female POD #5 s/p excision of left upper leg necrosis. She had some fluid around the wound site. We drained 35 ml sanguinous drainage from the site.  Patient felt some relief after the fluid removal. There was still some remaining fluctuance observed after fluid removal. She may require an additional drainage. Return in 2 weeks or sooner if the fluid re-accumulates.    Picture placed in chart with patient's consent.  Alfredo Batty, NP

## 2018-06-28 ENCOUNTER — Encounter: Payer: Self-pay | Admitting: Physical Therapy

## 2018-06-28 ENCOUNTER — Other Ambulatory Visit: Payer: Self-pay

## 2018-06-28 ENCOUNTER — Ambulatory Visit: Payer: BC Managed Care – PPO | Admitting: Physical Therapy

## 2018-06-28 DIAGNOSIS — M542 Cervicalgia: Secondary | ICD-10-CM | POA: Diagnosis not present

## 2018-06-28 DIAGNOSIS — G44221 Chronic tension-type headache, intractable: Secondary | ICD-10-CM

## 2018-06-28 DIAGNOSIS — R252 Cramp and spasm: Secondary | ICD-10-CM

## 2018-06-28 NOTE — Therapy (Signed)
[facility] [address], [place], [address] Phone: [phone]   Fax:  [phone]  Physical Therapy Treatment  Patient Details  Name: Margaret Lopez MRN: [id number] Date of Birth: 11-Mar-1970 Referring Provider (PT): Jaynee Eagles   Encounter Date: 06/28/2018  PT End of Session - 06/28/18 1133    Visit Number  5    Date for PT Re-Evaluation  07/31/18    PT Start Time  1105    PT Stop Time  1150    PT Time Calculation (min)  45 min    Activity Tolerance  Patient tolerated treatment well    Behavior During Therapy  [facility] for tasks assessed/performed       Past Medical History:  Diagnosis Date  . Anxiety   . Asthma   . Bipolar disorder ([place])   . Depression   . Deviated nasal septum 02/2011  . Generalized anxiety disorder 04/16/2013  . GERD (gastroesophageal reflux disease)   . Headache(784.0)    migraines  . Insomnia   . Nasal turbinate hypertrophy 02/2011   bilat.  . Neuromuscular disorder [facility])    Dr. Jerilynn Mages. Domingo Cocking- does injections around nerve in her head for prevention of migraines - q 3 months   . Pneumonia    hx of   . PONV (postoperative nausea and vomiting)   . Seasonal allergies     Past Surgical History:  Procedure Laterality Date  . ABDOMINAL HYSTERECTOMY    . CARPAL TUNNEL RELEASE Right   . DIAGNOSTIC LAPAROSCOPY    . EXCISION MASS LOWER EXTREMETIES Left 06/21/2018   Procedure: Excision of left upper leg fat necrosis;  Surgeon: Wallace Going, DO;  Location: [place];  Service: Plastics;  Laterality: Left;  . FOOT SURGERY Right    bone removed from great toe   . GASTRIC ROUX-EN-Y N/A 05/16/2016   Procedure: LAPAROSCOPIC ROUX-EN-Y GASTRIC BYPASS WITH UPPER ENDOSCOPY;  Surgeon: Clovis Riley, MD;  [facility];  Service: General;  Laterality: N/A;  . LAPAROSCOPIC VAGINAL HYSTERECTOMY  03/14/2007  . LIPOMA EXCISION Left 08/26/2016   Procedure: EXCISION LIPOMA FROM LEFT UPPER  POSTERIOR THIGH;  Surgeon: Clovis Riley, MD;  Location: [place];  Service: General;  Laterality: Left;  . MYRINGOTOMY WITH TUBE PLACEMENT Bilateral 03/02/2015   Procedure: MYRINGOTOMY WITH T-TUBE PLACEMENT;  Surgeon: Leta Baptist, MD;  Location: [place];  Service: ENT;  Laterality: Bilateral;  . NASAL SEPTOPLASTY W/ TURBINOPLASTY  02/22/2011   Procedure: NASAL SEPTOPLASTY WITH TURBINATE REDUCTION;  Surgeon: Ascencion Dike, MD;  Location: [place];  Service: ENT;  Laterality: Bilateral;  . TUBAL LIGATION  12/16/2004  . TYMPANOSTOMY TUBE PLACEMENT Bilateral 07/21/2017    There were no vitals filed for this visit.  Subjective Assessment - 06/28/18 1132    Subjective  Patient had surgery on her leg last week, she is limping and reports overall some increased stress and pain with this causing some neck and jaw pain again    Currently in Pain?  Yes    Pain Score  6     Pain Location  Neck    Pain Orientation  Right    Pain Descriptors / Indicators  Spasm;Tightness;Sore    Aggravating Factors   stress                       [facility] Adult PT Treatment/Exercise - 06/28/18 0001  Modalities   Modalities  Electrical Stimulation;Moist Heat;Ultrasound      Moist Heat Therapy   Number Minutes Moist Heat  15 Minutes    Moist Heat Location  Cervical      Electrical Stimulation   Electrical Stimulation Location  right upper trap and neck area    Electrical Stimulation Action  IFC    Electrical Stimulation Parameters  sitting    Electrical Stimulation Goals  Pain      Manual Therapy   Manual Therapy  Soft tissue mobilization;Manual Traction    Soft tissue mobilization  rhomboids, upper traps and the cervical parapsinals    Manual Traction  gentle occipital release with some gentle manual shoulder depression and stretches               PT Short Term Goals - 06/14/18 1131      PT SHORT TERM GOAL #1   Title  independent with initial HEP     Status  Achieved        PT Long Term Goals - 06/28/18 1135      PT LONG TERM GOAL #1   Title  understand proper posture and body mechanics    Status  Partially Met      PT LONG TERM GOAL #2   Title  report HA frequency decreased 50%    Status  On-going            Plan - 06/28/18 1134    Clinical Impression Statement  patient has increased tightness in the right lateral neck area up into the right jaw, she has a knot that is tender in the right upper trap and is tender in teh right rhomboid.  She had surgery on her leg last week.    PT Next Visit Plan  try to resume some exercises but be careful of the leg    Consulted and Agree with Plan of Care  Patient       Patient will benefit from skilled therapeutic intervention in order to improve the following deficits and impairments:  Increased muscle spasms, Improper body mechanics, Decreased range of motion, Decreased strength, Postural dysfunction, Pain  Visit Diagnosis: 1. Cervicalgia   2. Chronic tension-type headache, intractable   3. Cramp and spasm        Problem List Patient Active Problem List   Diagnosis Date Noted  . Hematoma of leg 03/02/2018  . Chronic migraine without aura, with intractable migraine, so stated, with status migrainosus 06/18/2017  . GAD (generalized anxiety disorder) 04/16/2013  . Bipolar 1 disorder, depressed ([place]) 04/16/2013    Sumner Boast., PT 06/28/2018, 11:36 AM  [address] [place], [place], [address] Phone: [phone]   Fax:  [phone]  Name: Margaret Lopez MRN: [id number] Date of Birth: [birth year]

## 2018-06-29 ENCOUNTER — Encounter: Payer: Self-pay | Admitting: Plastic Surgery

## 2018-07-02 ENCOUNTER — Telehealth: Payer: Self-pay | Admitting: Plastic Surgery

## 2018-07-02 NOTE — Telephone Encounter (Signed)

## 2018-07-03 ENCOUNTER — Other Ambulatory Visit: Payer: Self-pay

## 2018-07-03 ENCOUNTER — Ambulatory Visit: Payer: BC Managed Care – PPO | Admitting: Physical Therapy

## 2018-07-03 ENCOUNTER — Encounter: Payer: Self-pay | Admitting: Plastic Surgery

## 2018-07-03 ENCOUNTER — Ambulatory Visit (INDEPENDENT_AMBULATORY_CARE_PROVIDER_SITE_OTHER): Payer: BC Managed Care – PPO | Admitting: Surgical

## 2018-07-03 ENCOUNTER — Encounter: Payer: Self-pay | Admitting: Physical Therapy

## 2018-07-03 VITALS — BP 117/74 | HR 73 | Temp 98.0°F | Ht 63.0 in | Wt 141.0 lb

## 2018-07-03 DIAGNOSIS — M542 Cervicalgia: Secondary | ICD-10-CM | POA: Diagnosis not present

## 2018-07-03 DIAGNOSIS — S8012XD Contusion of left lower leg, subsequent encounter: Secondary | ICD-10-CM

## 2018-07-03 DIAGNOSIS — G44221 Chronic tension-type headache, intractable: Secondary | ICD-10-CM | POA: Diagnosis not present

## 2018-07-03 DIAGNOSIS — R252 Cramp and spasm: Secondary | ICD-10-CM

## 2018-07-03 NOTE — Progress Notes (Signed)
Established Patient Office Visit  Subjective:  Patient ID: Margaret Lopez, female    DOB: [birth year]  Age: 48 y.o. MRN: [id number]  CC:  Chief Complaint  Patient presents with  . Follow-up    (L) thigh for fluid collection    HPI Margaret Lopez is a 47 year old female presenting for follow up of left upper leg fluid accumulation s/p excision of left upper leg necrotic fat on June 21, 2018. She has some fluid collection in her left upper thigh. This area was previously drained on June 26, 2018. Patient reports experiencing some pain throughout the day, which progresses from 4/10 in the am to 6/10 in the evening. She has been taking Norco occasionally for the pain at night and reports it does help. Reports difficulty walking due to pain.    Past Medical History:  Diagnosis Date  . Anxiety   . Asthma   . Bipolar disorder ([place])   . Depression   . Deviated nasal septum 02/2011  . Generalized anxiety disorder 04/16/2013  . GERD (gastroesophageal reflux disease)   . Headache(784.0)    migraines  . Insomnia   . Nasal turbinate hypertrophy 02/2011   bilat.  . Neuromuscular disorder [facility])    Dr. Jerilynn Mages. Domingo Cocking- does injections around nerve in her head for prevention of migraines - q 3 months   . Pneumonia    hx of   . PONV (postoperative nausea and vomiting)   . Seasonal allergies     Past Surgical History:  Procedure Laterality Date  . ABDOMINAL HYSTERECTOMY    . CARPAL TUNNEL RELEASE Right   . DIAGNOSTIC LAPAROSCOPY    . EXCISION MASS LOWER EXTREMETIES Left 06/21/2018   Procedure: Excision of left upper leg fat necrosis;  Surgeon: Wallace Going, DO;  Location: [place];  Service: Plastics;  Laterality: Left;  . FOOT SURGERY Right    bone removed from great toe   . GASTRIC ROUX-EN-Y N/A 05/16/2016   Procedure: LAPAROSCOPIC ROUX-EN-Y GASTRIC BYPASS WITH UPPER ENDOSCOPY;  Surgeon: Clovis Riley, MD;  [facility];  Service: General;  Laterality: N/A;  .  LAPAROSCOPIC VAGINAL HYSTERECTOMY  03/14/2007  . LIPOMA EXCISION Left 08/26/2016   Procedure: EXCISION LIPOMA FROM LEFT UPPER POSTERIOR THIGH;  Surgeon: Clovis Riley, MD;  Location: [place];  Service: General;  Laterality: Left;  . MYRINGOTOMY WITH TUBE PLACEMENT Bilateral 03/02/2015   Procedure: MYRINGOTOMY WITH T-TUBE PLACEMENT;  Surgeon: Leta Baptist, MD;  Location: [place];  Service: ENT;  Laterality: Bilateral;  . NASAL SEPTOPLASTY W/ TURBINOPLASTY  02/22/2011   Procedure: NASAL SEPTOPLASTY WITH TURBINATE REDUCTION;  Surgeon: Ascencion Dike, MD;  Location: [place];  Service: ENT;  Laterality: Bilateral;  . TUBAL LIGATION  12/16/2004  . TYMPANOSTOMY TUBE PLACEMENT Bilateral 07/21/2017    Family History  Problem Relation Age of Onset  . ADD / ADHD Sister   . Hypertension Mother   . Sarcoidosis Mother   . Rheum arthritis Mother   . Hypertension Father   . Heart Problems Father   . Alcohol abuse Maternal Grandmother   . Bipolar disorder Maternal Grandmother   . Breast cancer Maternal Grandmother   . Breast cancer Maternal Aunt   . Heart Problems Other        dad's side of family  . Suicidality Neg Hx     Social History   Socioeconomic History  . Marital status: Legally Separated    Spouse name: Not  on file  . Number of children: 2  . Years of education: Not on file  . Highest education level: Some college, no degree  Occupational History  . Not on file  Social Needs  . Financial resource strain: Not on file  . Food insecurity    Worry: Not on file    Inability: Not on file  . Transportation needs    Medical: Not on file    Non-medical: Not on file  Tobacco Use  . Smoking status: Former Smoker    Packs/day: 0.50    Years: 4.00    Pack years: 2.00    Types: Cigarettes    Quit date: 11/12/2015    Years since quitting: 2.6  . Smokeless tobacco: Never Used  Substance and Sexual Activity  . Alcohol use: No    Alcohol/week: 0.0 standard drinks     Comment: once a month or less  . Drug use: No  . Sexual activity: Yes    Partners: Male    Birth control/protection: Surgical    Comment: Hysterectomy  Lifestyle  . Physical activity    Days per week: 5 days    Minutes per session: 40 min  . Stress: Only a little  Relationships  . Social Herbalist on phone: Not on file    Gets together: Not on file    Attends religious service: Not on file    Active member of club or organization: Not on file    Attends meetings of clubs or organizations: Not on file    Relationship status: Not on file  . Intimate partner violence    Fear of current or ex partner: Not on file    Emotionally abused: Not on file    Physically abused: Not on file    Forced sexual activity: Not on file  Other Topics Concern  . Not on file  Social History Narrative   Lives at home with boyfriend   Right handed   Caffeine: 2 cups daily    Outpatient Medications Prior to Visit  Medication Sig Dispense Refill  . albuterol (PROVENTIL HFA;VENTOLIN HFA) 108 (90 Base) MCG/ACT inhaler Inhale 1-2 puffs into the lungs every 6 (six) hours as needed for wheezing or shortness of breath.    . ALPRAZolam (XANAX) 0.5 MG tablet Take 1 tablet (0.5 mg total) by mouth 2 (two) times daily as needed for anxiety. 60 tablet 2  . cyclobenzaprine (FLEXERIL) 10 MG tablet Take 0.5-1 tablets (5-10 mg total) by mouth at bedtime as needed. TMJ 30 tablet 2  . Diclofenac Potassium (CAMBIA) 50 MG PACK Mix 1 pack (50 mg) with 1-2 oz of water and drink. May repeat in 12 hours if needed. Max 2 packs in 24 hours and max 9 packs per month. 9 each 3  . divalproex (DEPAKOTE ER) 500 MG 24 hr tablet Take 4 tablets (2,000 mg total) by mouth daily. 120 tablet 2  . EPINEPHrine 0.3 mg/0.3 mL IJ SOAJ injection Inject 0.3 mg into the muscle daily as needed (for anaphylatic reactions.).   1  . Erenumab-aooe (AIMOVIG) 140 MG/ML SOAJ Inject 140 mg into the skin every 30 (thirty) days. 1 pen 11  .  gabapentin (NEURONTIN) 100 MG capsule Take 1 capsule (100 mg total) by mouth 3 (three) times daily. 90 capsule 11  . hydrOXYzine (ATARAX/VISTARIL) 25 MG tablet Take 25 mg by mouth at bedtime as needed for itching.   3  . ipratropium (ATROVENT) 0.06 % nasal spray Place  2 sprays into both nostrils daily as needed. Allergies, runny nose.  4  . levocetirizine (XYZAL) 5 MG tablet Take 5 mg by mouth at bedtime.     [name] lithium carbonate (LITHOBID) 300 MG CR tablet Take 2 tablets (600 mg total) by mouth every evening. 60 tablet 2  . Multiple Vitamins-Minerals (OPURITY BYPASS OPTIMIZED) CHEW Chew 1 tablet by mouth daily.    . naratriptan (AMERGE) 2.5 MG tablet Take one (1) tablet as needed for headache or migraine. Do not exceed two doses of any triptan in one day. 10 tablet 6  . ondansetron (ZOFRAN-ODT) 4 MG disintegrating tablet Take 4 mg by mouth every 6 (six) hours as needed for nausea or vomiting.   0  . prochlorperazine (COMPAZINE) 10 MG tablet Take 1 tablet (10 mg total) by mouth 3 (three) times daily as needed for nausea or vomiting (migraine). MAY CAUSE SEDATION. 30 tablet 3  . SUMAtriptan Succinate (ZEMBRACE SYMTOUCH) 3 MG/0.5ML SOAJ Inject 3 mg into the skin every 15 (fifteen) minutes. May repeat again in 2 hours Maximum 4 in one day. 12 pen 11  . valACYclovir (VALTREX) 500 MG tablet Take 500 mg by mouth See admin instructions. Takes 500mg  daily for 5 days when she has a flare up.  3   No facility-administered medications prior to visit.     Allergies  Allergen Reactions  . Doxazosin Nausea And Vomiting  . Morphine And Related Itching  . Phenergan [Promethazine Hcl]     " knocks out for several hours"  . Aloe Hives  . Penicillins Rash    Has patient had a PCN reaction causing immediate rash, facial/tongue/throat swelling, SOB or lightheadedness with hypotension: No Has patient had a PCN reaction causing severe rash involving mucus membranes or skin necrosis: No Has patient had a PCN  reaction that required hospitalization No Has patient had a PCN reaction occurring within the last 10 years: No If all of the above answers are "NO", then may proceed with Cephalosporin use.   . Sulfa Antibiotics Rash    ROS Review of Systems  Constitutional: Negative.   HENT: Negative.   Respiratory: Negative.   Cardiovascular: Negative.   Musculoskeletal:       Antalgic gait   Skin:       Negative for bruising,erythema       Objective:     BP 117/74 (BP Location: Left Arm, Patient Position: Sitting, Cuff Size: Normal)   Pulse 73   Temp 98 F (36.7 C) (Oral)   Ht 5\' 3"  (1.6 m)   Wt 141 lb (64 kg)   SpO2 100%   BMI 24.98 kg/m  Wt Readings from Last 3 Encounters:  07/03/18 141 lb (64 kg)  06/26/18 143 lb (64.9 kg)  06/21/18 137 lb 12.6 oz (62.5 kg)   Physical Exam  Constitutional: WDWN, No acute distress.  HEENT: Normocephalic, atraumatic.  Neck: Full ROM Pulmonary/Chest: Effort normal, unlabored breathing, symmetric rise and fall Musculoskeletal:  Normal ROM, tenderness over L upper thigh. Neurological: alert and oriented, cooperative, calm.  Skin: Skin is warm and dry. Incision is clean, dry and intact. Fluctuance noted of left outer thigh Psychiatric: normal mood and affect. behavior is normal.   Health Maintenance Due  Topic Date Due  . HIV Screening  09/09/1987  . TETANUS/TDAP  09/09/1991  . PAP SMEAR-Modifier  03/18/2018    There are no preventive care reminders to display for this patient.  Lab Results  Component Value Date   TSH 2.860  02/22/2018   Lab Results  Component Value Date   WBC 9.2 01/05/2018   HGB 11.1 01/05/2018   HCT 34.4 01/05/2018   MCV 97 01/05/2018   PLT 210 01/05/2018   Lab Results  Component Value Date   NA 140 01/05/2018   K 5.0 01/05/2018   CO2 24 01/05/2018   GLUCOSE 80 01/05/2018   BUN 10 01/05/2018   CREATININE 0.58 01/05/2018   BILITOT 0.2 01/05/2018   ALKPHOS 45 01/05/2018   AST 16 01/05/2018   ALT 9  01/05/2018   PROT 5.4 (L) 01/05/2018   ALBUMIN 3.8 01/05/2018   CALCIUM 9.4 01/05/2018   ANIONGAP 5 11/24/2016   No results found for: CHOL No results found for: HDL No results found for: LDLCALC No results found for: TRIG No results found for: CHOLHDL No results found for: HGBA1C    Assessment & Plan:    Problem List Items Addressed This Visit      Other   Hematoma of leg - Primary     Plan: Fluid was noted on exam around her wound site.  The wound was clean dry intact and without any erythema or sign of infection. 50 cc of blood was drained from the site. Some fluctuance remained, but it was markedly improved.  Patient informed to call with questions or concerns or if pain worsens. Follow-up in 2 to 3 weeks to reassess for fluid accumulation.  Continue to wrap with Ace bandages and rest as needed.  Follow-up: Follow up in 2 to 3 weeks.   Carola Rhine Eldin Bonsell, PA-C

## 2018-07-03 NOTE — Therapy (Signed)
[facility] [address] [place], [place], [address] Phone: [phone]   Fax:  [phone]  Physical Therapy Treatment  Patient Details  Name: Margaret Lopez MRN: [id number] Date of Birth: 11/14/1970 Referring Provider (PT): Ahern   Encounter Date: 07/03/2018  PT End of Session - 07/03/18 1639    Visit Number  6    Date for PT Re-Evaluation  07/31/18    PT Start Time  1554    PT Stop Time  1648    PT Time Calculation (min)  54 min    Activity Tolerance  Patient tolerated treatment well    Behavior During Therapy  WFL for tasks assessed/performed       Past Medical History:  Diagnosis Date  . Anxiety   . Asthma   . Bipolar disorder (HCC)   . Depression   . Deviated nasal septum 02/2011  . Generalized anxiety disorder 04/16/2013  . GERD (gastroesophageal reflux disease)   . Headache(784.0)    migraines  . Insomnia   . Nasal turbinate hypertrophy 02/2011   bilat.  . Neuromuscular disorder (HCC)    Dr. M. Freeman- does injections around nerve in her head for prevention of migraines - q 3 months   . Pneumonia    hx of   . PONV (postoperative nausea and vomiting)   . Seasonal allergies     Past Surgical History:  Procedure Laterality Date  . ABDOMINAL HYSTERECTOMY    . CARPAL TUNNEL RELEASE Right   . DIAGNOSTIC LAPAROSCOPY    . EXCISION MASS LOWER EXTREMETIES Left 06/21/2018   Procedure: Excision of left upper leg fat necrosis;  Surgeon: Dillingham, Claire S, DO;  Location: [facility];  Service: Plastics;  Laterality: Left;  . FOOT SURGERY Right    bone removed from great toe   . GASTRIC ROUX-EN-Y N/A 05/16/2016   Procedure: LAPAROSCOPIC ROUX-EN-Y GASTRIC BYPASS WITH UPPER ENDOSCOPY;  Surgeon: Connor, Chelsea A, MD;  Location: [facility];  Service: General;  Laterality: N/A;  . LAPAROSCOPIC VAGINAL HYSTERECTOMY  03/14/2007  . LIPOMA EXCISION Left 08/26/2016   Procedure: EXCISION LIPOMA FROM LEFT UPPER  POSTERIOR THIGH;  Surgeon: Connor, Chelsea A, MD;  [facility];  Service: General;  Laterality: Left;  . MYRINGOTOMY WITH TUBE PLACEMENT Bilateral 03/02/2015   Procedure: MYRINGOTOMY WITH T-TUBE PLACEMENT;  Surgeon: Su Teoh, MD;  Location: [facility];  Service: ENT;  Laterality: Bilateral;  . NASAL SEPTOPLASTY W/ TURBINOPLASTY  02/22/2011   Procedure: NASAL SEPTOPLASTY WITH TURBINATE REDUCTION;  Surgeon: Sui W Teoh, MD;  Location: [facility];  Service: ENT;  Laterality: Bilateral;  . TUBAL LIGATION  12/16/2004  . TYMPANOSTOMY TUBE PLACEMENT Bilateral 07/21/2017    There were no vitals filed for this visit.  Subjective Assessment - 07/03/18 1557    Subjective  "My head is not hurting today so that's good" Fluid taken from Leg today    Currently in Pain?  Yes    Pain Score  6     Pain Location  Leg    Pain Orientation  Left                       [facility] Adult PT Treatment/Exercise - 07/03/18 0001      Exercises   Exercises  Neck      Neck Exercises: Machines for Strengthening   UBE (Upper Arm Bike)  L1 x 4 min each         Neck Exercises: Theraband   Shoulder Extension  20 reps   yellow   Rows  20 reps   yellow   Shoulder External Rotation  10 reps   yellow   Other Theraband Exercises  biceps curls 10lb 2x10     Other Theraband Exercises  triceps ext 20 lb 2x10       Modalities   Modalities  Electrical Stimulation;Moist Heat      Moist Heat Therapy   Number Minutes Moist Heat  15 Minutes    Moist Heat Location  Cervical      Electrical Stimulation   Electrical Stimulation Location  right upper trap and neck area    Electrical Stimulation Action  IFC    Electrical Stimulation Parameters  sitting    Electrical Stimulation Goals  Pain      Manual Therapy   Manual Therapy  Soft tissue mobilization;Manual Traction    Soft tissue mobilization  rhomboids, upper traps and the cervical parapsinals    Manual Traction  gentle  occipital release with some gentle manual shoulder depression and stretches               PT Short Term Goals - 06/14/18 1131      PT SHORT TERM GOAL #1   Title  independent with initial HEP    Status  Achieved        PT Long Term Goals - 06/28/18 1135      PT LONG TERM GOAL #1   Title  understand proper posture and body mechanics    Status  Partially Met      PT LONG TERM GOAL #2   Title  report HA frequency decreased 50%    Status  On-going            Plan - 07/03/18 1640    Clinical Impression Statement  Pt had fluid drained from hr LLE earlier today. Some tenderness noted in her R jaw with palpation. Knot noted in the lower part of R upper trap that seemed to ease up with STM. Postural cues's needed with standing rows and lats. No reports of increase pain with the exercises.    Stability/Clinical Decision Making  Stable/Uncomplicated    Rehab Potential  Good    PT Frequency  2x / week    PT Duration  8 weeks    PT Treatment/Interventions  ADLs/Self Care Home Management;Cryotherapy;Electrical Stimulation;Moist Heat;Traction;Ultrasound;Patient/family education;Manual techniques;Dry needling    PT Next Visit Plan  try to resume some exercises but be careful of the leg       Patient will benefit from skilled therapeutic intervention in order to improve the following deficits and impairments:  Increased muscle spasms, Improper body mechanics, Decreased range of motion, Decreased strength, Postural dysfunction, Pain  Visit Diagnosis: 1. Chronic tension-type headache, intractable   2. Cramp and spasm        Problem List Patient Active Problem List   Diagnosis Date Noted  . Hematoma of leg 03/02/2018  . Chronic migraine without aura, with intractable migraine, so stated, with status migrainosus 06/18/2017  . GAD (generalized anxiety disorder) 04/16/2013  . Bipolar 1 disorder, depressed ([place]) 04/16/2013    Scot Jun, PTA 07/03/2018, 4:43  PM  [place], [place], [address] Phone: [phone]   Fax:  [phone]  Name: Margaret Lopez MRN: [id number] Date of Birth: 27-Jun-1970

## 2018-07-09 ENCOUNTER — Encounter: Payer: Self-pay | Admitting: *Deleted

## 2018-07-09 ENCOUNTER — Encounter: Payer: Self-pay | Admitting: Physical Therapy

## 2018-07-09 ENCOUNTER — Other Ambulatory Visit: Payer: Self-pay

## 2018-07-09 ENCOUNTER — Ambulatory Visit: Payer: BC Managed Care – PPO | Admitting: Physical Therapy

## 2018-07-09 DIAGNOSIS — R252 Cramp and spasm: Secondary | ICD-10-CM | POA: Diagnosis not present

## 2018-07-09 DIAGNOSIS — M542 Cervicalgia: Secondary | ICD-10-CM

## 2018-07-09 DIAGNOSIS — G44221 Chronic tension-type headache, intractable: Secondary | ICD-10-CM

## 2018-07-09 NOTE — Therapy (Signed)
[place] [address] [place], [place], [address] Phone: [phone]   Fax:  [phone]  Physical Therapy Treatment  Patient Details  Name: Margaret Lopez MRN: [id number] Date of Birth: May 15, 1970 Referring Provider (PT): Jaynee Eagles   Encounter Date: 07/09/2018  PT End of Session - 07/09/18 1637    Visit Number  7    Date for PT Re-Evaluation  07/31/18    PT Start Time  1612    PT Stop Time  1655    PT Time Calculation (min)  43 min    Activity Tolerance  Patient tolerated treatment well    Behavior During Therapy  [facility] for tasks assessed/performed       Past Medical History:  Diagnosis Date  . Anxiety   . Asthma   . Bipolar disorder ([place])   . Depression   . Deviated nasal septum 02/2011  . Generalized anxiety disorder 04/16/2013  . GERD (gastroesophageal reflux disease)   . Headache(784.0)    migraines  . Insomnia   . Nasal turbinate hypertrophy 02/2011   bilat.  . Neuromuscular disorder [facility])    Dr. Jerilynn Mages. Domingo Cocking- does injections around nerve in her head for prevention of migraines - q 3 months   . Pneumonia    hx of   . PONV (postoperative nausea and vomiting)   . Seasonal allergies     Past Surgical History:  Procedure Laterality Date  . ABDOMINAL HYSTERECTOMY    . CARPAL TUNNEL RELEASE Right   . DIAGNOSTIC LAPAROSCOPY    . EXCISION MASS LOWER EXTREMETIES Left 06/21/2018   Procedure: Excision of left upper leg fat necrosis;  Surgeon: Wallace Going, DO;  Location: [facility];  Service: Plastics;  Laterality: Left;  . FOOT SURGERY Right    bone removed from great toe   . GASTRIC ROUX-EN-Y N/A 05/16/2016   Procedure: LAPAROSCOPIC ROUX-EN-Y GASTRIC BYPASS WITH UPPER ENDOSCOPY;  Surgeon: Clovis Riley, MD;  [facility];  Service: General;  Laterality: N/A;  . LAPAROSCOPIC VAGINAL HYSTERECTOMY  03/14/2007  . LIPOMA EXCISION Left 08/26/2016   Procedure: EXCISION LIPOMA FROM LEFT UPPER  POSTERIOR THIGH;  Surgeon: Clovis Riley, MD;  Location: [facility];  Service: General;  Laterality: Left;  . MYRINGOTOMY WITH TUBE PLACEMENT Bilateral 03/02/2015   Procedure: MYRINGOTOMY WITH T-TUBE PLACEMENT;  Surgeon: Leta Baptist, MD;  Location: [place];  Service: ENT;  Laterality: Bilateral;  . NASAL SEPTOPLASTY W/ TURBINOPLASTY  02/22/2011   Procedure: NASAL SEPTOPLASTY WITH TURBINATE REDUCTION;  Surgeon: Ascencion Dike, MD;  Location: [facility];  Service: ENT;  Laterality: Bilateral;  . TUBAL LIGATION  12/16/2004  . TYMPANOSTOMY TUBE PLACEMENT Bilateral 07/21/2017    There were no vitals filed for this visit.  Subjective Assessment - 07/09/18 1613    Subjective  I felt like I did okay with the exercises and my neck and head are not bad today    Currently in Pain?  Yes    Pain Score  3     Pain Location  Neck    Aggravating Factors   the leg has been hurting after the surgery                       [facility] Adult PT Treatment/Exercise - 07/09/18 0001      Neck Exercises: Machines for Strengthening   UBE (Upper Arm Bike)  L2x 4 min each  Neck Exercises: Theraband   Other Theraband Exercises  biceps curls 10lb 2x10 , 5# straight arm pulleys iwht cues for core activation    Other Theraband Exercises  triceps ext 20 lb 2x10       Neck Exercises: Standing   Other Standing Exercises  W's and overhead ball lift      Neck Exercises: Supine   Neck Retraction  20 reps    Neck Retraction Limitations  head on ball and then quarter turns    Other Supine Exercise  isometric abdominals      Modalities   Modalities  Electrical Stimulation;Moist Heat      Moist Heat Therapy   Number Minutes Moist Heat  15 Minutes    Moist Heat Location  Cervical      Electrical Stimulation   Electrical Stimulation Location  right upper trap and neck area    Electrical Stimulation Action  IFC    Electrical Stimulation Parameters  sitting    Electrical  Stimulation Goals  Pain               PT Short Term Goals - 06/14/18 1131      PT SHORT TERM GOAL #1   Title  independent with initial HEP    Status  Achieved        PT Long Term Goals - 07/09/18 1640      PT LONG TERM GOAL #1   Title  understand proper posture and body mechanics    Status  Partially Met      PT LONG TERM GOAL #3   Title  report HA intensity decreased 25%    Status  Partially Met      PT LONG TERM GOAL #4   Title  increase UE strength to 4/5    Status  Partially Met            Plan - 07/09/18 1638    Clinical Impression Statement  Pateint is overall feeling better, she is still having some issues with mobility due to her recent leg surgery, her cervical motion is better as is her pain, she is still very tight and tender in the upper traps.  Cues needed for posture during exercise, her arms really fatigued quickly with arm exercises    PT Next Visit Plan  try to resume some exercises but be careful of the leg    Consulted and Agree with Plan of Care  Patient       Patient will benefit from skilled therapeutic intervention in order to improve the following deficits and impairments:  Increased muscle spasms, Improper body mechanics, Decreased range of motion, Decreased strength, Postural dysfunction, Pain  Visit Diagnosis: 1. Chronic tension-type headache, intractable   2. Cramp and spasm   3. Cervicalgia        Problem List Patient Active Problem List   Diagnosis Date Noted  . Hematoma of leg 03/02/2018  . Chronic migraine without aura, with intractable migraine, so stated, with status migrainosus 06/18/2017  . GAD (generalized anxiety disorder) 04/16/2013  . Bipolar 1 disorder, depressed ([place]) 04/16/2013   Sumner Boast., PT  07/09/2018, 4:41 PM  [place] [address] [place], [place], [address] Phone: [phone]   Fax:  [phone]  Name: Margaret Lopez MRN: [id number] Date of Birth: Jun 16, 1970

## 2018-07-10 ENCOUNTER — Encounter: Payer: BC Managed Care – PPO | Attending: Surgery | Admitting: Skilled Nursing Facility1

## 2018-07-10 ENCOUNTER — Ambulatory Visit (INDEPENDENT_AMBULATORY_CARE_PROVIDER_SITE_OTHER): Payer: BC Managed Care – PPO | Admitting: Nurse Practitioner

## 2018-07-10 ENCOUNTER — Encounter: Payer: Self-pay | Admitting: Nurse Practitioner

## 2018-07-10 DIAGNOSIS — E669 Obesity, unspecified: Secondary | ICD-10-CM | POA: Insufficient documentation

## 2018-07-10 DIAGNOSIS — S8012XD Contusion of left lower leg, subsequent encounter: Secondary | ICD-10-CM

## 2018-07-10 NOTE — Patient Instructions (Addendum)
-  Try an alarm to drink more   -Try chicken broth   -Take 10 minutes each day to prepare for your next day  -Take 10 minutes each day to focus on personal goals   -Start your day with 4-8 ounces every morning of kefir

## 2018-07-10 NOTE — Progress Notes (Signed)
Post-Operative RYGB Surgery  Medical Nutrition Therapy:  Appt start time: 3:20 end time:  4:11.  Primary concerns today: Post-operative Bariatric Surgery Nutrition Management.  Non scale victories: more active  Pt arrives struggling to preform self care due to home life stress with the result of skipping meals, not meeting her fluid needs, and constipation.   Pt state she struggles with going too long without eating and then overeating when she does eat making her sick. Pt states if she makes lunch while cleaning from dinner that works well. Pt sates she is working from her ex husbands house which is 2 blocks from her house. Pt states she needs to wait 40 minutes after eating before drinking. Pt states she has been having an acidic stomach.  Pt is very particular about what she will drink.  Pt states she has been very stressed and scattered with family and things.   Surgery date: 05/16/2016 Surgery type: RYGB [facility] weight [facility]: 227.3 lbs 02/05/2016 Weight today: 139.1 Weight loss goal: less than 150 lbs, learn to eat healthier foods, be more active/present with family  Body Composition Scale 07/10/2018  Total Body Fat % 27.3  Visceral Fat 6  Fat-Free Mass % 72.6   Total Body Water % 50.8   Muscle-Mass lbs 29.9  Body Fat Displacement          Torso  lbs 23.4         Left Leg  lbs 4.6         Right Leg  lbs 4.6         Left Arm  lbs 2.3         Right Arm   lbs 2.3   24-hr recall: skipping meals throughout the week often B (AM): protein shake  Snk (AM):  Decaf coffee or propel L (PM): chicken thigh with cream cheese shredded cheese and bacon with green onion and mac n cheese or ham sandwich Snk (PM): sometimes yogurt or uncrustable  4pm (early dinner): pimento cheese and bagel crisps and cucmbers or hamburgers and mac n cheese and salad  D (PM): ground hamburger with ketchup on hotdog bun with cheese and lettuce and tater tots  Snk (PM): popcorn   Fluid intake: 2 decaff  coffee, 1 bottle of propel, sugar free koolaid in water, and caffinated coffee: not tracking: 32-40 ounces Estimated total protein intake: 60g  Medications: See list Supplementation: opurity: inconsistant once a day and tums 3 times a day  Using straws: no Drinking while eating: no Having you been chewing well: yes Chewing/swallowing difficulties: no Changes in vision: no Changes to mood/headaches: no Hair loss/Changes to skin/Changes to nails: no Any difficulty focusing or concentrating: no Sweating: no Dizziness/Lightheaded: no Palpitations: no  Carbonated beverages: no N/V/D/C/GAS: constipation  Abdominal Pain: no Dumping syndrome: no  Recent physical activity:  Walking 4 days a week 20-30 minutes   Progress Towards Goal(s):  In progress.     Intervention:  Nutrition education and counseling. Goals: -Try an alarm to drink more   -Try chicken broth   -Take 10 minutes each day to prepare for your next day  -Take 10 minutes each day to focus on personal goals   -Start your day with 4-8 ounces every morning of kefir   -Have a piece of fruit with your protein shake   Teaching Method Utilized:  Visual Auditory Hands on  Barriers to learning/adherence to lifestyle change: none  Demonstrated degree of understanding via:  Teach Back  Monitoring/Evaluation:  Dietary intake, exercise, and body weight. Return in 3 months.

## 2018-07-10 NOTE — Progress Notes (Signed)
Patient ID: Margaret Lopez, female    DOB: 09/04/1970, 48 y.o.   MRN: [id number]   C.C.: pain at wound site  Margaret Lopez is a 48 yo female who presents for follow up excision of left upper leg necrosis on 06/21/18. She has required fluid aspiration from the area twice since surgery. She has again developed more pain and fluid around the area. She has been wrapping the leg with a binder, which sometimes causes increased pain.   Review of Systems  Constitutional: Negative.   HENT: Negative.   Respiratory: Negative.   Cardiovascular: Negative.   Gastrointestinal: Negative.   Genitourinary: Negative.   Musculoskeletal:       Left leg pain  Neurological: Negative.     Past Medical History:  Diagnosis Date  . Anxiety   . Asthma   . Bipolar disorder ([place])   . Depression   . Deviated nasal septum 02/2011  . Generalized anxiety disorder 04/16/2013  . GERD (gastroesophageal reflux disease)   . Headache(784.0)    migraines  . Insomnia   . Nasal turbinate hypertrophy 02/2011   bilat.  . Neuromuscular disorder [facility])    Dr. Jerilynn Mages. Domingo Cocking- does injections around nerve in her head for prevention of migraines - q 3 months   . Pneumonia    hx of   . PONV (postoperative nausea and vomiting)   . Seasonal allergies     Past Surgical History:  Procedure Laterality Date  . ABDOMINAL HYSTERECTOMY    . CARPAL TUNNEL RELEASE Right   . DIAGNOSTIC LAPAROSCOPY    . EXCISION MASS LOWER EXTREMETIES Left 06/21/2018   Procedure: Excision of left upper leg fat necrosis;  Surgeon: Wallace Going, DO;  Location: [place];  Service: Plastics;  Laterality: Left;  . FOOT SURGERY Right    bone removed from great toe   . GASTRIC ROUX-EN-Y N/A 05/16/2016   Procedure: LAPAROSCOPIC ROUX-EN-Y GASTRIC BYPASS WITH UPPER ENDOSCOPY;  Surgeon: Clovis Riley, MD;  [facility];  Service: General;  Laterality: N/A;  . LAPAROSCOPIC VAGINAL HYSTERECTOMY  03/14/2007  . LIPOMA EXCISION Left  08/26/2016   Procedure: EXCISION LIPOMA FROM LEFT UPPER POSTERIOR THIGH;  Surgeon: Clovis Riley, MD;  Location: [place];  Service: General;  Laterality: Left;  . MYRINGOTOMY WITH TUBE PLACEMENT Bilateral 03/02/2015   Procedure: MYRINGOTOMY WITH T-TUBE PLACEMENT;  Surgeon: Leta Baptist, MD;  Location: [place];  Service: ENT;  Laterality: Bilateral;  . NASAL SEPTOPLASTY W/ TURBINOPLASTY  02/22/2011   Procedure: NASAL SEPTOPLASTY WITH TURBINATE REDUCTION;  Surgeon: Ascencion Dike, MD;  Location: [place];  Service: ENT;  Laterality: Bilateral;  . TUBAL LIGATION  12/16/2004  . TYMPANOSTOMY TUBE PLACEMENT Bilateral 07/21/2017      Current Outpatient Medications:  .  albuterol (PROVENTIL HFA;VENTOLIN HFA) 108 (90 Base) MCG/ACT inhaler, Inhale 1-2 puffs into the lungs every 6 (six) hours as needed for wheezing or shortness of breath., Disp: , Rfl:  .  ALPRAZolam (XANAX) 0.5 MG tablet, Take 1 tablet (0.5 mg total) by mouth 2 (two) times daily as needed for anxiety., Disp: 60 tablet, Rfl: 2 .  cyclobenzaprine (FLEXERIL) 10 MG tablet, Take 0.5-1 tablets (5-10 mg total) by mouth at bedtime as needed. TMJ, Disp: 30 tablet, Rfl: 2 .  Diclofenac Potassium (CAMBIA) 50 MG PACK, Mix 1 pack (50 mg) with 1-2 oz of water and drink. May repeat in 12 hours if needed. Max 2 packs in 24  hours and max 9 packs per month., Disp: 9 each, Rfl: 3 .  divalproex (DEPAKOTE ER) 500 MG 24 hr tablet, Take 4 tablets (2,000 mg total) by mouth daily., Disp: 120 tablet, Rfl: 2 .  EPINEPHrine 0.3 mg/0.3 mL IJ SOAJ injection, Inject 0.3 mg into the muscle daily as needed (for anaphylatic reactions.). , Disp: , Rfl: 1 .  Erenumab-aooe (AIMOVIG) 140 MG/ML SOAJ, Inject 140 mg into the skin every 30 (thirty) days., Disp: 1 pen, Rfl: 11 .  gabapentin (NEURONTIN) 100 MG capsule, Take 1 capsule (100 mg total) by mouth 3 (three) times daily., Disp: 90 capsule, Rfl: 11 .  hydrOXYzine (ATARAX/VISTARIL) 25 MG tablet, Take  25 mg by mouth at bedtime as needed for itching. , Disp: , Rfl: 3 .  ipratropium (ATROVENT) 0.06 % nasal spray, Place 2 sprays into both nostrils daily as needed. Allergies, runny nose., Disp: , Rfl: 4 .  levocetirizine (XYZAL) 5 MG tablet, Take 5 mg by mouth at bedtime. , Disp: , Rfl:  .  lithium carbonate (LITHOBID) 300 MG CR tablet, Take 2 tablets (600 mg total) by mouth every evening., Disp: 60 tablet, Rfl: 2 .  Multiple Vitamins-Minerals (OPURITY BYPASS OPTIMIZED) CHEW, Chew 1 tablet by mouth daily., Disp: , Rfl:  .  naratriptan (AMERGE) 2.5 MG tablet, Take one (1) tablet as needed for headache or migraine. Do not exceed two doses of any triptan in one day., Disp: 10 tablet, Rfl: 6 .  ondansetron (ZOFRAN-ODT) 4 MG disintegrating tablet, Take 4 mg by mouth every 6 (six) hours as needed for nausea or vomiting. , Disp: , Rfl: 0 .  prochlorperazine (COMPAZINE) 10 MG tablet, Take 1 tablet (10 mg total) by mouth 3 (three) times daily as needed for nausea or vomiting (migraine). MAY CAUSE SEDATION., Disp: 30 tablet, Rfl: 3 .  SUMAtriptan Succinate (ZEMBRACE SYMTOUCH) 3 MG/0.5ML SOAJ, Inject 3 mg into the skin every 15 (fifteen) minutes. May repeat again in 2 hours Maximum 4 in one day., Disp: 12 pen, Rfl: 11 .  valACYclovir (VALTREX) 500 MG tablet, Take 500 mg by mouth See admin instructions. Takes 500mg  daily for 5 days when she has a flare up., Disp: , Rfl: 3   Objective:   There were no vitals filed for this visit.  Physical Exam  General: alert, calm, no acute distress Chest: symmetrical rise and fall Lungs: unlabored breathing Musculoskeletal: MAEx4 Neuro: A&O x3, calm, cooperative, steady gait Extremities: left leg wound with healing incision, fluctuance within the wound area Skin:mild ecchymosis of left upper leg wound area   Assessment & Plan:  Margaret Lopez is a 48 yo female s/p excision of left upper leg necrosis on 06/21/18. She has recurrent fluid collection within the wound area.  We aspirated 27 ml dark serosanguinous fluid from the site. Patient felt some relief after fluid removal. She may require additional drainage. She should continue wearing the binder. Return as needed.      Alfredo Batty, NP

## 2018-07-12 ENCOUNTER — Ambulatory Visit: Payer: BC Managed Care – PPO | Admitting: Physical Therapy

## 2018-07-16 ENCOUNTER — Other Ambulatory Visit: Payer: Self-pay | Admitting: Neurology

## 2018-07-16 MED ORDER — ZEMBRACE SYMTOUCH 3 MG/0.5ML ~~LOC~~ SOAJ: 3.0000 mg | SUBCUTANEOUS | 11 refills | Status: DC

## 2018-07-16 MED ORDER — NARATRIPTAN HCL 2.5 MG PO TABS: ORAL_TABLET | ORAL | 6 refills | Status: DC

## 2018-07-16 MED ORDER — ONDANSETRON 4 MG PO TBDP: 4.0000 mg | ORAL_TABLET | Freq: Four times a day (QID) | ORAL | 11 refills | Status: DC | PRN

## 2018-07-16 MED ORDER — AIMOVIG 140 MG/ML ~~LOC~~ SOAJ: 140.0000 mg | SUBCUTANEOUS | 11 refills | Status: DC

## 2018-07-16 MED ORDER — GABAPENTIN 100 MG PO CAPS: 100.0000 mg | ORAL_CAPSULE | Freq: Three times a day (TID) | ORAL | 11 refills | Status: DC

## 2018-07-16 MED ORDER — CYCLOBENZAPRINE HCL 10 MG PO TABS: 5.0000 mg | ORAL_TABLET | Freq: Every evening | ORAL | 4 refills | Status: DC | PRN

## 2018-07-16 MED ORDER — PROCHLORPERAZINE MALEATE 10 MG PO TABS: 10.0000 mg | ORAL_TABLET | Freq: Three times a day (TID) | ORAL | 3 refills | Status: AC | PRN

## 2018-07-17 DIAGNOSIS — J301 Allergic rhinitis due to pollen: Secondary | ICD-10-CM | POA: Diagnosis not present

## 2018-07-17 DIAGNOSIS — J3089 Other allergic rhinitis: Secondary | ICD-10-CM | POA: Diagnosis not present

## 2018-07-17 DIAGNOSIS — J3081 Allergic rhinitis due to animal (cat) (dog) hair and dander: Secondary | ICD-10-CM | POA: Diagnosis not present

## 2018-07-17 DIAGNOSIS — R05 Cough: Secondary | ICD-10-CM | POA: Diagnosis not present

## 2018-07-19 NOTE — Telephone Encounter (Signed)
We received another PA for zembrace 3 mg despite previous approval. I spoke with the pharmacist at [facility]. They may have tried to run too high of quantity and it generated a PA. Patient already picked up the medication recently. No further action required.

## 2018-07-23 NOTE — Progress Notes (Signed)
   Subjective:     Patient ID: Margaret Lopez, female    DOB: 1970/12/08, 48 y.o.   MRN: [id number]  Chief Complaint  Patient presents with  . Follow-up    2 weeks    HPI: The patient is a 48 y.o. yrs old female here for follow up on her left upper leg hematoma. She was last seen 07/10/18, 27 cc of dark serosanguinous fluid was aspirated at that time. Today she is doing much better. We aspirated 15 cc of serous fluid.   She has not had to use a brace/ACE wrap as often and the pain is very minimal. She is overall doing very well.  No sign of infection.   Review of Systems  Constitutional: Negative.  Negative for chills and fever.  HENT: Negative.   Respiratory: Negative.   Cardiovascular: Negative.   Musculoskeletal: Negative.  Negative for joint pain and myalgias.  Skin: Negative.   Neurological: Negative.      Objective:   Vital Signs BP 109/71 (BP Location: Left Arm, Patient Position: Sitting, Cuff Size: Normal)   Pulse 69   Temp 98.4 F (36.9 C) (Temporal)   Ht 5\' 3"  (1.6 m)   Wt 141 lb (64 kg)   SpO2 100%   BMI 24.98 kg/m  Vital Signs and Nursing Note Reviewed  Physical Exam  Constitutional: She is oriented to person, place, and time and well-developed, well-nourished, and in no distress. No distress.  HENT:  Head: Normocephalic and atraumatic.  Neck: Normal range of motion.  Cardiovascular: Normal rate.  Pulmonary/Chest: Effort normal.  Musculoskeletal:        General: Edema (fluctuance noted over distal aspect of L lateral thigh.) present. No tenderness.  Neurological: She is alert and oriented to person, place, and time. Gait normal.  Skin: Skin is warm and dry. No rash noted. She is not diaphoretic. No erythema.  Psychiatric: Mood and affect normal.    Assessment/Plan:     ICD-10-CM   1. Hematoma of left lower extremity, subsequent encounter  S80.12XD     Margaret Lopez is doing very well. Since her visit 2 weeks ago, she has had minimal pain and  significant improvement in her seroma.  We aspirated 15 cc of serous fluid today. No sign of infection.  Follow up as needed. Call with questions or concerns.   Carola Rhine Conroy Goracke, PA-C 07/24/2018, 1:55 PM

## 2018-07-24 ENCOUNTER — Other Ambulatory Visit: Payer: Self-pay

## 2018-07-24 ENCOUNTER — Ambulatory Visit (INDEPENDENT_AMBULATORY_CARE_PROVIDER_SITE_OTHER): Payer: BC Managed Care – PPO | Admitting: Surgical

## 2018-07-24 ENCOUNTER — Encounter: Payer: Self-pay | Admitting: Surgical

## 2018-07-24 VITALS — BP 109/71 | HR 69 | Temp 98.4°F | Ht 63.0 in | Wt 141.0 lb

## 2018-07-24 DIAGNOSIS — S8012XD Contusion of left lower leg, subsequent encounter: Secondary | ICD-10-CM

## 2018-08-06 ENCOUNTER — Encounter: Payer: Self-pay | Admitting: Neurology

## 2018-08-06 ENCOUNTER — Other Ambulatory Visit: Payer: Self-pay

## 2018-08-06 ENCOUNTER — Ambulatory Visit (INDEPENDENT_AMBULATORY_CARE_PROVIDER_SITE_OTHER): Payer: BC Managed Care – PPO | Admitting: Neurology

## 2018-08-06 VITALS — BP 105/64 | HR 73 | Temp 98.2°F | Ht 63.0 in | Wt 145.2 lb

## 2018-08-06 DIAGNOSIS — G43711 Chronic migraine without aura, intractable, with status migrainosus: Secondary | ICD-10-CM | POA: Diagnosis not present

## 2018-08-06 DIAGNOSIS — M5481 Occipital neuralgia: Secondary | ICD-10-CM | POA: Diagnosis not present

## 2018-08-06 MED ORDER — NURTEC 75 MG PO TBDP: 75.0000 mg | ORAL_TABLET | Freq: Every day | ORAL | 0 refills | Status: DC | PRN

## 2018-08-06 MED ORDER — KETOROLAC TROMETHAMINE 60 MG/2ML IM SOLN
60.0000 mg | Freq: Once | INTRAMUSCULAR | Status: AC
  Administered 2018-08-06: 13:00:00 60 mg via INTRAMUSCULAR

## 2018-08-06 MED ORDER — TOSYMRA 10 MG/ACT NA SOLN: 1.0000 | NASAL | 0 refills | Status: DC | PRN

## 2018-08-06 NOTE — Patient Instructions (Addendum)
[employer]: Patient drug information Access [employer] Online here. Copyright [phone] [employer] All rights reserved. (For additional information see "Rime[employer]ant: Drug information") Brand Names: [name]  Nurtec  What is this drug used for?   It is used to treat migraine headaches.  What do I need to tell my doctor BEFORE I take this drug?   If you are allergic to this drug; any part of this drug; or any other drugs, foods, or substances. Tell your doctor about the allergy and what signs you had.   If you have any of these health problems: Kidney disease or liver disease.   If you take any drugs (prescription or OTC, natural products, vitamins) that must not be taken with this drug, like certain drugs that are used for HIV, infections, or seizures. There are many drugs that must not be taken with this drug.   This is not a list of all drugs or health problems that interact with this drug.   Tell your doctor and pharmacist about all of your drugs (prescription or OTC, natural products, vitamins) and health problems. You must check to make sure that it is safe for you to take this drug with all of your drugs and health problems. Do not start, stop, or change the dose of any drug without checking with your doctor.  What are some things I need to know or do while I take this drug?   Tell all of your health care providers that you take this drug. This includes your doctors, nurses, pharmacists, and dentists.   This drug is not meant to prevent or lower the number of migraine headaches you get.   Tell your doctor if you are pregnant, plan on getting pregnant, or are breast-feeding. You will need to talk about the benefits and risks to you and the baby.  What are some side effects that I need to call my doctor about right away?   WARNING/CAUTION: Even though it may be rare, some people may have very bad and sometimes deadly side effects when taking a drug. Tell your doctor or get medical help right  away if you have any of the following signs or symptoms that may be related to a very bad side effect:   Signs of an allergic reaction, like rash; hives; itching; red, swollen, blistered, or peeling skin with or without fever; wheezing; tightness in the chest or throat; trouble breathing, swallowing, or talking; unusual hoarseness; or swelling of the mouth, face, lips, tongue, or throat.  What are some other side effects of this drug?   All drugs may cause side effects. However, many people have no side effects or only have minor side effects. Call your doctor or get medical help if any of these side effects or any other side effects bother you or do not go away:   Upset stomach.   These are not all of the side effects that may occur. If you have questions about side effects, call your doctor. Call your doctor for medical advice about side effects.   You may report side effects to your national health agency.  How is this drug best taken?   Use this drug as ordered by your doctor. Read all information given to you. Follow all instructions closely.   Do not push the tablet out of the foil when opening. Use dry hands to take it from the foil. Place on your tongue and let it dissolve. Water is not needed. Do not swallow it whole. Do  not chew, break, or crush it.   If needed, you may place the tablet under the tongue.   Use right after opening.  What do I do if I miss a dose?   This drug is taken on an as needed basis. Do not take more often than told by the doctor.  How do I store and/or throw out this drug?   Store at room temperature in a dry place. Do not store in a bathroom.   Store in foil pouch until ready for use.   Keep all drugs in a safe place. Keep all drugs out of the reach of children and pets.   Throw away unused or expired drugs. Do not flush down a toilet or pour down a drain unless you are told to do so. Check with your pharmacist if you have questions about the best way to throw  out drugs. There may be drug take-back programs in your area.  General drug facts   If your symptoms or health problems do not get better or if they become worse, call your doctor.   Do not share your drugs with others and do not take anyone else's drugs.   Some drugs may have another patient information leaflet. If you have any questions about this drug, please talk with your doctor, nurse, pharmacist, or other health care provider.   If you think there has been an overdose, call your poison control center or get medical care right away. Be ready to tell or show what was taken, how much, and when it happened.  Use of UpToDate is subject to the Subscription and License Agreement. Topic [phone] Version 3.0   Sumatriptan nasal spray What is this medicine? SUMATRIPTAN (soo ma TRIP tan) is used to treat migraines with or without aura. An aura is a strange feeling or visual disturbance that warns you of an attack. It is not used to prevent migraines. This medicine may be used for other purposes; ask your health care provider or pharmacist if you have questions. COMMON BRAND NAME(S): Imitrex, Tosymra What should I tell my health care provider before I take this medicine? They need to know if you have any of these conditions:  cigarette smoker  circulation problems in fingers and toes  diabetes  heart disease  high blood pressure  high cholesterol  history of irregular heartbeat  history of stroke  kidney disease  liver disease  stomach or intestine problems  an unusual or allergic reaction to sumatriptan, other medicines, foods, dyes, or preservatives  pregnant or trying to get pregnant  breast-feeding How should I use this medicine? This medicine is for use in the nose. Follow the directions on your product or prescription label. Do not use more often than directed. Make sure that you are using your nasal spray correctly. Ask your doctor or health care professional if you  have any questions. Talk to your pediatrician regarding the use of this medicine in children. Special care may be needed. Overdosage: If you think you have taken too much of this medicine contact a poison control center or emergency room at once. NOTE: This medicine is only for you. Do not share this medicine with others. What if I miss a dose? This does not apply. This medicine is not for regular use. What may interact with this medicine? Do not take this medicine with any of the following medicines:  certain medicines for migraine headache like almotriptan, eletriptan, frovatriptan, naratriptan, rizatriptan, sumatriptan, zolmitriptan  ergot alkaloids  like dihydroergotamine, ergonovine, ergotamine, methylergonovine  MAOIs like Carbex, Eldepryl, Marplan, Nardil, and Parnate This medicine may also interact with the following medications:  certain medicines for depression, anxiety, or psychotic disorders This list may not describe all possible interactions. Give your health care provider a list of all the medicines, herbs, non-prescription drugs, or dietary supplements you use. Also tell them if you smoke, drink alcohol, or use illegal drugs. Some items may interact with your medicine. What should I watch for while using this medicine? Visit your healthcare professional for regular checks on your progress. Tell your healthcare professional if your symptoms do not start to get better or if they get worse. You may get drowsy or dizzy. Do not drive, use machinery, or do anything that needs mental alertness until you know how this medicine affects you. Do not stand up or sit up quickly, especially if you are an older patient. This reduces the risk of dizzy or fainting spells. Alcohol may interfere with the effect of this medicine. Tell your healthcare professional right away if you have any change in your eyesight. If you take migraine medicines for 10 or more days a month, your migraines may get  worse. Keep a diary of headache days and medicine use. Contact your healthcare professional if your migraine attacks occur more frequently. What side effects may I notice from receiving this medicine? Side effects that you should report to your doctor or health care professional as soon as possible:  allergic reactions like skin rash, itching or hives, swelling of the face, lips, or tongue  changes in vision  chest pain or chest tightness  signs and symptoms of a dangerous change in heartbeat or heart rhythm like chest pain; dizziness; fast, irregular heartbeat; palpitations; feeling faint or lightheaded; falls; breathing problems  signs and symptoms of a stroke like changes in vision; confusion; trouble speaking or understanding; severe headaches; sudden numbness or weakness of the face, arm or leg; trouble walking; dizziness; loss of balance or coordination  signs and symptoms of serotonin syndrome like irritable; confusion; diarrhea; fast or irregular heartbeat; muscle twitching; stiff muscles; trouble walking; sweating; high fever; seizures; chills; vomiting Side effects that usually do not require medical attention (report to your doctor or health care professional if they continue or are bothersome):  changes in taste  diarrhea  dizziness  drowsiness  dry mouth  headache  nausea, vomiting  pain, tingling, numbness in the hands or feet  stomach pain This list may not describe all possible side effects. Call your doctor for medical advice about side effects. You may report side effects to FDA at 1-800-FDA-1088. Where should I keep my medicine? Keep out of the reach of children. Store at room temperature between 2 and 30 degrees C (36 and 86 degrees F). Throw away any unused medicine after the expiration date. NOTE: This sheet is a summary. It may not cover all possible information. If you have questions about this medicine, talk to your doctor, pharmacist, or health care  provider.  2020 Elsevier/Gold Standard (2017-07-11 15:08:07)

## 2018-08-06 NOTE — Addendum Note (Signed)
Addended by: Minna Antis on: 08/06/2018 01:23 PM   Modules accepted: Orders
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
## 2018-08-09 ENCOUNTER — Encounter: Payer: Self-pay | Admitting: Physical Therapy

## 2018-08-09 ENCOUNTER — Other Ambulatory Visit: Payer: Self-pay

## 2018-08-09 ENCOUNTER — Ambulatory Visit: Payer: BC Managed Care – PPO | Attending: Neurology | Admitting: Physical Therapy

## 2018-08-09 DIAGNOSIS — M542 Cervicalgia: Secondary | ICD-10-CM | POA: Diagnosis not present

## 2018-08-09 DIAGNOSIS — G44221 Chronic tension-type headache, intractable: Secondary | ICD-10-CM | POA: Diagnosis not present

## 2018-08-09 DIAGNOSIS — R252 Cramp and spasm: Secondary | ICD-10-CM | POA: Diagnosis not present

## 2018-08-09 NOTE — Therapy (Signed)
[facility] [place], [place], [address] Phone: [phone]   Fax:  [phone]  Physical Therapy Treatment  Patient Details  Name: Margaret Lopez MRN: [id number] Date of Birth: 07-07-1970 Referring Provider (PT): Jaynee Eagles   Encounter Date: 08/09/2018  PT End of Session - 08/09/18 1229    Visit Number  8    PT Start Time  1150    PT Stop Time  1240    PT Time Calculation (min)  50 min    Activity Tolerance  Patient tolerated treatment well;Patient limited by pain    Behavior During Therapy  [facility] for tasks assessed/performed       Past Medical History:  Diagnosis Date  . Anxiety   . Asthma   . Bipolar disorder ([place])   . Depression   . Deviated nasal septum 02/2011  . Generalized anxiety disorder 04/16/2013  . GERD (gastroesophageal reflux disease)   . Headache(784.0)    migraines  . Insomnia   . Nasal turbinate hypertrophy 02/2011   bilat.  . Neuromuscular disorder [facility])    Dr. Jerilynn Mages. Domingo Cocking- does injections around nerve in her head for prevention of migraines - q 3 months   . Pneumonia    hx of   . PONV (postoperative nausea and vomiting)   . Seasonal allergies     Past Surgical History:  Procedure Laterality Date  . ABDOMINAL HYSTERECTOMY    . CARPAL TUNNEL RELEASE Right   . DIAGNOSTIC LAPAROSCOPY    . EXCISION MASS LOWER EXTREMETIES Left 06/21/2018   Procedure: Excision of left upper leg fat necrosis;  Surgeon: Wallace Going, DO;  Location: [place];  Service: Plastics;  Laterality: Left;  . FOOT SURGERY Right    bone removed from great toe   . GASTRIC ROUX-EN-Y N/A 05/16/2016   Procedure: LAPAROSCOPIC ROUX-EN-Y GASTRIC BYPASS WITH UPPER ENDOSCOPY;  Surgeon: Clovis Riley, MD;  Location: [facility];  Service: General;  Laterality: N/A;  . LAPAROSCOPIC VAGINAL HYSTERECTOMY  03/14/2007  . LIPOMA EXCISION Left 08/26/2016   Procedure: EXCISION LIPOMA FROM LEFT UPPER POSTERIOR THIGH;   Surgeon: Clovis Riley, MD;  Location: [place];  Service: General;  Laterality: Left;  . MYRINGOTOMY WITH TUBE PLACEMENT Bilateral 03/02/2015   Procedure: MYRINGOTOMY WITH T-TUBE PLACEMENT;  Surgeon: Leta Baptist, MD;  Location: [place];  Service: ENT;  Laterality: Bilateral;  . NASAL SEPTOPLASTY W/ TURBINOPLASTY  02/22/2011   Procedure: NASAL SEPTOPLASTY WITH TURBINATE REDUCTION;  Surgeon: Ascencion Dike, MD;  Location: [place];  Service: ENT;  Laterality: Bilateral;  . TUBAL LIGATION  12/16/2004  . TYMPANOSTOMY TUBE PLACEMENT Bilateral 07/21/2017    There were no vitals filed for this visit.  Subjective Assessment - 08/09/18 1151    Subjective  Pt reports that she has been having a lot of bad migraines. "Every time I remember to call yall you have been closed"    Patient Stated Goals  lessen HA, UE strength    Currently in Pain?  Yes    Pain Score  6     Pain Location  --   headaches        [facility] PT Assessment - 08/09/18 0001      Prior Function   Level of Independence  Independent    Vocation  Part time employment    Vocation Requirements  mostly on the computer       AROM  Overall AROM Comments  cervical ROM is decreased 50% with c/o tightness, shoulders WFL's      Strength   Overall Strength Comments  UE strength -4/5 with some pain                   [facility] Adult PT Treatment/Exercise - 08/09/18 0001      Neck Exercises: Machines for Strengthening   UBE (Upper Arm Bike)  L2x 4 min each       Moist Heat Therapy   Number Minutes Moist Heat  15 Minutes    Moist Heat Location  Cervical      Electrical Stimulation   Electrical Stimulation Location  right upper trap and neck area    Electrical Stimulation Action  IFC    Electrical Stimulation Parameters  sitting    Electrical Stimulation Goals  Pain      Manual Therapy   Manual Therapy  Soft tissue mobilization;Manual Traction    Soft tissue mobilization  rhomboids, upper  traps and the cervical parapsinals    Manual Traction  gentle occipital release with some gentle manual shoulder depression and stretches               PT Short Term Goals - 06/14/18 1131      PT SHORT TERM GOAL #1   Title  independent with initial HEP    Status  Achieved        PT Long Term Goals - 08/09/18 1231      PT LONG TERM GOAL #1   Title  understand proper posture and body mechanics    Status  Partially Met      PT LONG TERM GOAL #2   Title  report HA frequency decreased 50%    Status  On-going      PT LONG TERM GOAL #3   Title  report HA intensity decreased 25%    Status  On-going      PT LONG TERM GOAL #4   Title  increase UE strength to 4/5    Status  Partially Met      PT LONG TERM GOAL #5   Title  incresae cervical ROM 25%    Status  Partially Met            Plan - 08/09/18 1233    Clinical Impression Statement  Pt return so PT after a month of being away. During that time she reports saving really bad migraines. Her cervical ROM is still limited actively. I was able to take her through full Passive cervical rotation during MT. She reports sin increase neck pain with UBE warm up.    Stability/Clinical Decision Making  Stable/Uncomplicated    Rehab Potential  Good    PT Frequency  2x / week    PT Duration  8 weeks    PT Treatment/Interventions  ADLs/Self Care Home Management;Cryotherapy;Electrical Stimulation;Moist Heat;Traction;Ultrasound;Patient/family education;Manual techniques;Dry needling    PT Next Visit Plan  Assess Tx, try to progress with UE strengthening as tolerated.       Patient will benefit from skilled therapeutic intervention in order to improve the following deficits and impairments:  Increased muscle spasms, Improper body mechanics, Decreased range of motion, Decreased strength, Postural dysfunction, Pain  Visit Diagnosis: 1. Cramp and spasm   2. Chronic tension-type headache, intractable   3. Cervicalgia         Problem List Patient Active Problem List   Diagnosis Date Noted  . Hematoma of leg 03/02/2018  .  Chronic migraine without aura, with intractable migraine, so stated, with status migrainosus 06/18/2017  . GAD (generalized anxiety disorder) 04/16/2013  . Bipolar 1 disorder, depressed ([place]) 04/16/2013    Scot Jun, PTA 08/09/2018, 12:37 PM  [place], [place], [address] Phone: [phone]   Fax:  [phone]  Name: Margaret Lopez MRN: [id number] Date of Birth: 19-Oct-1970

## 2018-08-14 ENCOUNTER — Other Ambulatory Visit: Payer: Self-pay

## 2018-08-14 ENCOUNTER — Encounter: Payer: Self-pay | Admitting: Physical Therapy

## 2018-08-14 ENCOUNTER — Ambulatory Visit: Payer: BC Managed Care – PPO | Attending: Neurology | Admitting: Physical Therapy

## 2018-08-14 DIAGNOSIS — M542 Cervicalgia: Secondary | ICD-10-CM | POA: Insufficient documentation

## 2018-08-14 DIAGNOSIS — G44221 Chronic tension-type headache, intractable: Secondary | ICD-10-CM | POA: Diagnosis not present

## 2018-08-14 DIAGNOSIS — R252 Cramp and spasm: Secondary | ICD-10-CM | POA: Insufficient documentation

## 2018-08-14 NOTE — Therapy (Signed)
[place], [place], [address] Phone: [phone]   Fax:  [phone]  Physical Therapy Treatment  Patient Details  Name: Margaret Lopez MRN: [id number] Date of Birth: 08-09-1970 Referring Provider (PT): Jaynee Eagles   Encounter Date: 08/14/2018  PT End of Session - 08/14/18 1726    Visit Number  9    Date for PT Re-Evaluation  09/09/18    PT Start Time  2836    PT Stop Time  1736    PT Time Calculation (min)  43 min    Activity Tolerance  Patient tolerated treatment well    Behavior During Therapy  [facility] for tasks assessed/performed;Anxious       Past Medical History:  Diagnosis Date  . Anxiety   . Asthma   . Bipolar disorder ([place])   . Depression   . Deviated nasal septum 02/2011  . Generalized anxiety disorder 04/16/2013  . GERD (gastroesophageal reflux disease)   . Headache(784.0)    migraines  . Insomnia   . Nasal turbinate hypertrophy 02/2011   bilat.  . Neuromuscular disorder [facility])    Dr. Jerilynn Mages. Domingo Cocking- does injections around nerve in her head for prevention of migraines - q 3 months   . Pneumonia    hx of   . PONV (postoperative nausea and vomiting)   . Seasonal allergies     Past Surgical History:  Procedure Laterality Date  . ABDOMINAL HYSTERECTOMY    . CARPAL TUNNEL RELEASE Right   . DIAGNOSTIC LAPAROSCOPY    . EXCISION MASS LOWER EXTREMETIES Left 06/21/2018   Procedure: Excision of left upper leg fat necrosis;  Surgeon: Wallace Going, DO;  Location: [place];  Service: Plastics;  Laterality: Left;  . FOOT SURGERY Right    bone removed from great toe   . GASTRIC ROUX-EN-Y N/A 05/16/2016   Procedure: LAPAROSCOPIC ROUX-EN-Y GASTRIC BYPASS WITH UPPER ENDOSCOPY;  Surgeon: Clovis Riley, MD;  [facility];  Service: General;  Laterality: N/A;  . LAPAROSCOPIC VAGINAL HYSTERECTOMY  03/14/2007  . LIPOMA EXCISION Left 08/26/2016   Procedure: EXCISION LIPOMA FROM LEFT  UPPER POSTERIOR THIGH;  Surgeon: Clovis Riley, MD;  Location: [place];  Service: General;  Laterality: Left;  . MYRINGOTOMY WITH TUBE PLACEMENT Bilateral 03/02/2015   Procedure: MYRINGOTOMY WITH T-TUBE PLACEMENT;  Surgeon: Leta Baptist, MD;  Location: [place];  Service: ENT;  Laterality: Bilateral;  . NASAL SEPTOPLASTY W/ TURBINOPLASTY  02/22/2011   Procedure: NASAL SEPTOPLASTY WITH TURBINATE REDUCTION;  Surgeon: Ascencion Dike, MD;  Location: [name];  Service: ENT;  Laterality: Bilateral;  . TUBAL LIGATION  12/16/2004  . TYMPANOSTOMY TUBE PLACEMENT Bilateral 07/21/2017    There were no vitals filed for this visit.  Subjective Assessment - 08/14/18 1658    Subjective  Patient reports that she had a HA for about 8-9 days, had injections int he scalp and the upper traps and the neck that has helped.    Currently in Pain?  Yes    Pain Score  4     Pain Location  Neck                       [facility] Adult PT Treatment/Exercise - 08/14/18 0001      Neck Exercises: Machines for Strengthening   Nustep  level 4 x 6 minutes    Other Machines for Strengthening  leg curls 20#,  leg extension 5# 2x10 each, extensions were tough      Neck Exercises: Theraband   Shoulder Extension  20 reps;Red    Rows  20 reps;Red    Shoulder External Rotation  20 reps;Red      Neck Exercises: Standing   Other Standing Exercises  5# shrugs and upper trap and levator stretches      Neck Exercises: Supine   Neck Retraction  20 reps    Neck Retraction Limitations  head on ball and then quarter turns, she did not like quarter turns so we stopped      Modalities   Modalities  Electrical Stimulation;Moist Heat      Moist Heat Therapy   Number Minutes Moist Heat  15 Minutes    Moist Heat Location  Cervical      Electrical Stimulation   Electrical Stimulation Location  right upper trap and neck area    Electrical Stimulation Action  IFC    Electrical Stimulation  Parameters  supine    Electrical Stimulation Goals  Pain      Manual Therapy   Manual Therapy  Soft tissue mobilization;Manual Traction    Soft tissue mobilization  rhomboids, upper traps and the cervical parapsinals    Manual Traction  gentle occipital release with some gentle manual shoulder depression and stretches               PT Short Term Goals - 06/14/18 1131      PT SHORT TERM GOAL #1   Title  independent with initial HEP    Status  Achieved        PT Long Term Goals - 08/14/18 1728      PT LONG TERM GOAL #1   Title  understand proper posture and body mechanics    Status  Achieved            Plan - 08/14/18 1727    Clinical Impression Statement  Patient reports getting over a 9 day HA, has had the injections in the scalp and the neck, she is a little anxious about resuming exercises but feels like she needs to, she did not like the 1/4 turn head retractions, she is tight in the upper traps.    PT Next Visit Plan  Assess Tx, try to progress with UE strengthening as tolerated.    Consulted and Agree with Plan of Care  Patient       Patient will benefit from skilled therapeutic intervention in order to improve the following deficits and impairments:  Increased muscle spasms, Improper body mechanics, Decreased range of motion, Decreased strength, Postural dysfunction, Pain  Visit Diagnosis: 1. Cramp and spasm   2. Chronic tension-type headache, intractable   3. Cervicalgia        Problem List Patient Active Problem List   Diagnosis Date Noted  . Hematoma of leg 03/02/2018  . Chronic migraine without aura, with intractable migraine, so stated, with status migrainosus 06/18/2017  . GAD (generalized anxiety disorder) 04/16/2013  . Bipolar 1 disorder, depressed ([place]) 04/16/2013    Sumner Boast., PT 08/14/2018, 5:29 PM  [address] [place], [place], [address] Phone:  [phone]   Fax:  [phone]  Name: Margaret Lopez MRN: [id number] Date of Birth: 28-Nov-1970

## 2018-08-16 ENCOUNTER — Encounter: Payer: Self-pay | Admitting: Physical Therapy

## 2018-08-16 ENCOUNTER — Encounter (HOSPITAL_COMMUNITY): Payer: Self-pay | Admitting: Psychiatry

## 2018-08-16 ENCOUNTER — Ambulatory Visit (INDEPENDENT_AMBULATORY_CARE_PROVIDER_SITE_OTHER): Payer: BC Managed Care – PPO | Admitting: Psychiatry

## 2018-08-16 ENCOUNTER — Other Ambulatory Visit: Payer: Self-pay

## 2018-08-16 ENCOUNTER — Ambulatory Visit: Payer: BC Managed Care – PPO | Admitting: Physical Therapy

## 2018-08-16 DIAGNOSIS — F319 Bipolar disorder, unspecified: Secondary | ICD-10-CM

## 2018-08-16 DIAGNOSIS — R252 Cramp and spasm: Secondary | ICD-10-CM

## 2018-08-16 DIAGNOSIS — M542 Cervicalgia: Secondary | ICD-10-CM

## 2018-08-16 DIAGNOSIS — F411 Generalized anxiety disorder: Secondary | ICD-10-CM

## 2018-08-16 DIAGNOSIS — G44221 Chronic tension-type headache, intractable: Secondary | ICD-10-CM

## 2018-08-16 MED ORDER — LITHIUM CARBONATE ER 300 MG PO TBCR: 600.0000 mg | EXTENDED_RELEASE_TABLET | Freq: Every evening | ORAL | 2 refills | Status: DC

## 2018-08-16 MED ORDER — DIVALPROEX SODIUM ER 500 MG PO TB24: 2000.0000 mg | ORAL_TABLET | Freq: Every day | ORAL | 2 refills | Status: DC

## 2018-08-16 NOTE — Therapy (Signed)
[place], [place], [address] Phone: [phone]   Fax:  [phone]  Physical Therapy Treatment  Patient Details  Name: Margaret Lopez MRN: [id number] Date of Birth: 1970/12/19 Referring Provider (PT): Jaynee Eagles   Encounter Date: 08/16/2018  PT End of Session - 08/16/18 1648    Visit Number  10    Date for PT Re-Evaluation  09/09/18    PT Start Time  1620    PT Stop Time  1707    PT Time Calculation (min)  47 min    Activity Tolerance  Patient tolerated treatment well    Behavior During Therapy  [facility] for tasks assessed/performed;Anxious       Past Medical History:  Diagnosis Date  . Anxiety   . Asthma   . Bipolar disorder ([place])   . Depression   . Deviated nasal septum 02/2011  . Generalized anxiety disorder 04/16/2013  . GERD (gastroesophageal reflux disease)   . Headache(784.0)    migraines  . Insomnia   . Nasal turbinate hypertrophy 02/2011   bilat.  . Neuromuscular disorder [facility])    Dr. Jerilynn Mages. Domingo Cocking- does injections around nerve in her head for prevention of migraines - q 3 months   . Pneumonia    hx of   . PONV (postoperative nausea and vomiting)   . Seasonal allergies     Past Surgical History:  Procedure Laterality Date  . ABDOMINAL HYSTERECTOMY    . CARPAL TUNNEL RELEASE Right   . DIAGNOSTIC LAPAROSCOPY    . EXCISION MASS LOWER EXTREMETIES Left 06/21/2018   Procedure: Excision of left upper leg fat necrosis;  Surgeon: Wallace Going, DO;  Location: [place];  Service: Plastics;  Laterality: Left;  . FOOT SURGERY Right    bone removed from great toe   . GASTRIC ROUX-EN-Y N/A 05/16/2016   Procedure: LAPAROSCOPIC ROUX-EN-Y GASTRIC BYPASS WITH UPPER ENDOSCOPY;  Surgeon: Clovis Riley, MD;  [facility];  Service: General;  Laterality: N/A;  . LAPAROSCOPIC VAGINAL HYSTERECTOMY  03/14/2007  . LIPOMA EXCISION Left 08/26/2016   Procedure: EXCISION LIPOMA FROM  LEFT UPPER POSTERIOR THIGH;  Surgeon: Clovis Riley, MD;  Location: [place];  Service: General;  Laterality: Left;  . MYRINGOTOMY WITH TUBE PLACEMENT Bilateral 03/02/2015   Procedure: MYRINGOTOMY WITH T-TUBE PLACEMENT;  Surgeon: Leta Baptist, MD;  Location: [place];  Service: ENT;  Laterality: Bilateral;  . NASAL SEPTOPLASTY W/ TURBINOPLASTY  02/22/2011   Procedure: NASAL SEPTOPLASTY WITH TURBINATE REDUCTION;  Surgeon: Ascencion Dike, MD;  Location: [place];  Service: ENT;  Laterality: Bilateral;  . TUBAL LIGATION  12/16/2004  . TYMPANOSTOMY TUBE PLACEMENT Bilateral 07/21/2017    There were no vitals filed for this visit.  Subjective Assessment - 08/16/18 1624    Subjective  Patient reports that she could tell she exercised for the first time in awhile, reports that she was tight and sore    Currently in Pain?  Yes    Pain Score  4     Pain Location  Neck    Pain Descriptors / Indicators  Tightness    Aggravating Factors   stress    Pain Relieving Factors  the treatment helps                       [facility] Adult PT Treatment/Exercise - 08/16/18 0001      Neck Exercises: Machines  for Strengthening   Nustep  level 5 x 6 minutes    Cybex Row  15# 2x10    Lat Pull  15# 2x10    Other Machines for Strengthening  leg curls 20#, leg extension 5# 2x10 each, extensions were tough      Neck Exercises: Theraband   Shoulder Extension  20 reps;Red    Rows  20 reps;Red    Shoulder External Rotation  20 reps;Red      Neck Exercises: Standing   Other Standing Exercises  W's and overhead ball lift    Other Standing Exercises  5# shrugs and upper trap and levator stretches      Modalities   Modalities  Electrical Stimulation;Moist Heat      Moist Heat Therapy   Number Minutes Moist Heat  15 Minutes    Moist Heat Location  Cervical      Electrical Stimulation   Electrical Stimulation Location  right upper trap and neck area    Electrical Stimulation  Action  IFC    Electrical Stimulation Parameters  supine    Electrical Stimulation Goals  Pain      Manual Therapy   Manual Therapy  Soft tissue mobilization;Manual Traction    Soft tissue mobilization  rhomboids, upper traps and the cervical parapsinals    Manual Traction  gentle occipital release with some gentle manual shoulder depression and stretches               PT Short Term Goals - 06/14/18 1131      PT SHORT TERM GOAL #1   Title  independent with initial HEP    Status  Achieved        PT Long Term Goals - 08/16/18 1650      PT LONG TERM GOAL #1   Title  understand proper posture and body mechanics    Time  8    Status  Achieved      PT LONG TERM GOAL #2   Title  report HA frequency decreased 50%    Status  On-going      PT LONG TERM GOAL #3   Title  report HA intensity decreased 25%    Status  On-going            Plan - 08/16/18 1649    Clinical Impression Statement  Patient tolerated the return to exercise last visit and today well, she is very tight in the cervical area.  Needs to return to her PLOF when we saw her last fall she was very strong and able to do all exercises at home and at gym without issue    PT Next Visit Plan  continue to add exercises as tolerated    Consulted and Agree with Plan of Care  Patient       Patient will benefit from skilled therapeutic intervention in order to improve the following deficits and impairments:  Increased muscle spasms, Improper body mechanics, Decreased range of motion, Decreased strength, Postural dysfunction, Pain  Visit Diagnosis: 1. Cramp and spasm   2. Chronic tension-type headache, intractable   3. Cervicalgia        Problem List Patient Active Problem List   Diagnosis Date Noted  . Hematoma of leg 03/02/2018  . Chronic migraine without aura, with intractable migraine, so stated, with status migrainosus 06/18/2017  . GAD (generalized anxiety disorder) 04/16/2013  . Bipolar 1  disorder, depressed ([place]) 04/16/2013    Sumner Boast., PT 08/16/2018, 4:51 PM  [facility]  [place], [place], [address] Phone: [phone]   Fax:  [phone]  Name: Margaret Lopez MRN: [id number] Date of Birth: 04-Oct-1970

## 2018-08-16 NOTE — Progress Notes (Signed)
Virtual Visit via Telephone Note  I connected with Margaret Lopez on 08/16/18 at  2:30 PM EDT by telephone and verified that I am speaking with the correct person using two identifiers.  Location: Patient: home Provider: work   I discussed the limitations, risks, security and privacy concerns of performing an evaluation and management service by telephone and the availability of in person appointments. I also discussed with the patient that there may be a patient responsible charge related to this service. The patient expressed understanding and agreed to proceed.   History of Present Illness: "I am doing pretty good. I feel like things are really great. She feels she has better control over her mood and behaviors. Pt still gets irritable but is able to control her response. She is anxious and stressed by COVID but is managing. She is trying to deal with finding childcare. Pt states depression is very mild and manageable. Pt is more anxious than depressed. She denies any manic or hypomanic like symptoms. She denies SI/HI. Her sleep is ok. Most days she is able to take her total dose of Depakote but some days she can't.    Observations/Objective: I spoke with Margaret Lopez on the phone.  Pt was calm, pleasant and cooperative.  Pt was engaged in the conversation and answered questions appropriately.  Speech was clear and coherent with normal rate, tone and volume.  Mood is anxious, affect is full. Thought processes are coherent, goal oriented and intact.  Thought content is logical.  Pt denies SI/HI.   Pt denies auditory and visual hallucinations and did not appear to be responding to internal stimuli.  Memory and concentration are good.  Fund of knowledge and use of language are average.  Insight and judgment are fair.  I am unable to comment on psychomotor activity, general appearance, hygiene, or eye contact as I was unable to physically see the patient on the phone.  Vital signs not available since  interview conducted virtually.    Assessment and Plan: GAD; Bipolar I d/o  Lithium CR 600mg  po qPM  Xanax 0.5mg  po BID prn anxiety- taken it 3-4x this month  Depakote ER 2000mg  po qHS She has not been able tolerate liquid Depakote or sprinkles.   Follow Up Instructions: In 3 months or sooner if needed   I discussed the assessment and treatment plan with the patient. The patient was provided an opportunity to ask questions and all were answered. The patient agreed with the plan and demonstrated an understanding of the instructions.   The patient was advised to call back or seek an in-person evaluation if the symptoms worsen or if the condition fails to improve as anticipated.  I provided 20 minutes of non-face-to-face time during this encounter.   Charlcie Cradle, MD

## 2018-08-20 ENCOUNTER — Encounter: Payer: Self-pay | Admitting: Physical Therapy

## 2018-08-20 ENCOUNTER — Ambulatory Visit: Payer: BC Managed Care – PPO | Admitting: Physical Therapy

## 2018-08-20 ENCOUNTER — Other Ambulatory Visit: Payer: Self-pay

## 2018-08-20 DIAGNOSIS — R252 Cramp and spasm: Secondary | ICD-10-CM

## 2018-08-20 DIAGNOSIS — M542 Cervicalgia: Secondary | ICD-10-CM

## 2018-08-20 DIAGNOSIS — G44221 Chronic tension-type headache, intractable: Secondary | ICD-10-CM

## 2018-08-20 NOTE — Therapy (Signed)
[place], [place], [address] Phone: [phone]   Fax:  [phone]  Physical Therapy Treatment  Patient Details  Name: Margaret Lopez MRN: [id number] Date of Birth: [birth year] Referring Provider (PT): Jaynee Eagles   Encounter Date: 08/20/2018  PT End of Session - 08/20/18 1655    Visit Number  11    Date for PT Re-Evaluation  09/09/18    PT Start Time  1620    PT Stop Time  1705    PT Time Calculation (min)  45 min    Activity Tolerance  Patient tolerated treatment well    Behavior During Therapy  [facility] for tasks assessed/performed;Anxious       Past Medical History:  Diagnosis Date  . Anxiety   . Asthma   . Bipolar disorder ([place])   . Depression   . Deviated nasal septum 02/2011  . Generalized anxiety disorder 04/16/2013  . GERD (gastroesophageal reflux disease)   . Headache(784.0)    migraines  . Insomnia   . Nasal turbinate hypertrophy 02/2011   bilat.  . Neuromuscular disorder [facility])    Dr. Jerilynn Mages. Domingo Cocking- does injections around nerve in her head for prevention of migraines - q 3 months   . Pneumonia    hx of   . PONV (postoperative nausea and vomiting)   . Seasonal allergies     Past Surgical History:  Procedure Laterality Date  . ABDOMINAL HYSTERECTOMY    . CARPAL TUNNEL RELEASE Right   . DIAGNOSTIC LAPAROSCOPY    . EXCISION MASS LOWER EXTREMETIES Left 06/21/2018   Procedure: Excision of left upper leg fat necrosis;  Surgeon: Wallace Going, DO;  Location: [place];  Service: Plastics;  Laterality: Left;  . FOOT SURGERY Right    bone removed from great toe   . GASTRIC ROUX-EN-Y N/A 05/16/2016   Procedure: LAPAROSCOPIC ROUX-EN-Y GASTRIC BYPASS WITH UPPER ENDOSCOPY;  Surgeon: Clovis Riley, MD;  [facility];  Service: General;  Laterality: N/A;  . LAPAROSCOPIC VAGINAL HYSTERECTOMY  03/14/2007  . LIPOMA EXCISION Left 08/26/2016   Procedure: EXCISION LIPOMA FROM  LEFT UPPER POSTERIOR THIGH;  Surgeon: Clovis Riley, MD;  Location: [place];  Service: General;  Laterality: Left;  . MYRINGOTOMY WITH TUBE PLACEMENT Bilateral 03/02/2015   Procedure: MYRINGOTOMY WITH T-TUBE PLACEMENT;  Surgeon: Leta Baptist, MD;  Location: [place];  Service: ENT;  Laterality: Bilateral;  . NASAL SEPTOPLASTY W/ TURBINOPLASTY  02/22/2011   Procedure: NASAL SEPTOPLASTY WITH TURBINATE REDUCTION;  Surgeon: Ascencion Dike, MD;  Location: [place];  Service: ENT;  Laterality: Bilateral;  . TUBAL LIGATION  12/16/2004  . TYMPANOSTOMY TUBE PLACEMENT Bilateral 07/21/2017    There were no vitals filed for this visit.  Subjective Assessment - 08/20/18 1628    Subjective  I felt okay, I am tight today, it is a Monday    Currently in Pain?  Yes    Pain Score  4     Pain Location  Neck    Pain Descriptors / Indicators  Spasm;Tightness    Aggravating Factors   stress                       [facility] Adult PT Treatment/Exercise - 08/20/18 0001      Neck Exercises: Machines for Strengthening   Nustep  level 5 x 6 minutes    Cybex Row  20# 2x10  Lat Pull  20# 2x10    Other Machines for Strengthening  leg press 30# 2x10    Other Machines for Strengthening  leg curls 20# 2x10      Neck Exercises: Seated   Other Seated Exercise  2# bent over row, extnesions and 1# reverse flies      Neck Exercises: Supine   Neck Retraction  20 reps    Neck Retraction Limitations  head on ball      Moist Heat Therapy   Number Minutes Moist Heat  15 Minutes    Moist Heat Location  Cervical      Electrical Stimulation   Electrical Stimulation Location  bilateral c/t area    Electrical Stimulation Action  IFC    Electrical Stimulation Parameters  sitting    Electrical Stimulation Goals  Pain               PT Short Term Goals - 06/14/18 1131      PT SHORT TERM GOAL #1   Title  independent with initial HEP    Status  Achieved        PT Long  Term Goals - 08/16/18 1650      PT LONG TERM GOAL #1   Title  understand proper posture and body mechanics    Time  8    Status  Achieved      PT LONG TERM GOAL #2   Title  report HA frequency decreased 50%    Status  On-going      PT LONG TERM GOAL #3   Title  report HA intensity decreased 25%    Status  On-going            Plan - 08/20/18 1656    Clinical Impression Statement  Patient reports some stress with school starting soon and her trying to get things set up fro her boys.  She has some tightness in the upper traps and in the cervical area.  She tolerated the exercises but is very weak in the arms and upper back    PT Next Visit Plan  continue to add exercises as tolerated    Consulted and Agree with Plan of Care  Patient       Patient will benefit from skilled therapeutic intervention in order to improve the following deficits and impairments:  Increased muscle spasms, Improper body mechanics, Decreased range of motion, Decreased strength, Postural dysfunction, Pain  Visit Diagnosis: 1. Cramp and spasm   2. Chronic tension-type headache, intractable   3. Cervicalgia        Problem List Patient Active Problem List   Diagnosis Date Noted  . Hematoma of leg 03/02/2018  . Chronic migraine without aura, with intractable migraine, so stated, with status migrainosus 06/18/2017  . GAD (generalized anxiety disorder) 04/16/2013  . Bipolar 1 disorder, depressed ([place]) 04/16/2013    Sumner Boast., PT 08/20/2018, 4:57 PM  [address] [place], [place], [address] Phone: [phone]   Fax:  [phone]  Name: Margaret Lopez MRN: [id number] Date of Birth: [birth year]

## 2018-08-23 ENCOUNTER — Other Ambulatory Visit: Payer: Self-pay

## 2018-08-23 ENCOUNTER — Encounter: Payer: Self-pay | Admitting: Physical Therapy

## 2018-08-23 ENCOUNTER — Ambulatory Visit: Payer: BC Managed Care – PPO | Admitting: Physical Therapy

## 2018-08-23 DIAGNOSIS — G44221 Chronic tension-type headache, intractable: Secondary | ICD-10-CM

## 2018-08-23 DIAGNOSIS — M542 Cervicalgia: Secondary | ICD-10-CM

## 2018-08-23 DIAGNOSIS — R252 Cramp and spasm: Secondary | ICD-10-CM

## 2018-08-23 NOTE — Therapy (Signed)
[place] [place], [place], [address] Phone: [phone]   Fax:  [phone]  Physical Therapy Treatment  Patient Details  Name: LAYNE LEBON MRN: [id number] Date of Birth: [birth year] Referring Provider (PT): Jaynee Eagles   Encounter Date: 08/23/2018  PT End of Session - 08/23/18 1225    Visit Number  12    Date for PT Re-Evaluation  09/09/18    PT Start Time  1146    PT Stop Time  1240    PT Time Calculation (min)  54 min    Activity Tolerance  Patient tolerated treatment well    Behavior During Therapy  [facility] for tasks assessed/performed;Anxious       Past Medical History:  Diagnosis Date  . Anxiety   . Asthma   . Bipolar disorder ([place])   . Depression   . Deviated nasal septum 02/2011  . Generalized anxiety disorder 04/16/2013  . GERD (gastroesophageal reflux disease)   . Headache(784.0)    migraines  . Insomnia   . Nasal turbinate hypertrophy 02/2011   bilat.  . Neuromuscular disorder [facility])    Dr. Jerilynn Mages. Domingo Cocking- does injections around nerve in her head for prevention of migraines - q 3 months   . Pneumonia    hx of   . PONV (postoperative nausea and vomiting)   . Seasonal allergies     Past Surgical History:  Procedure Laterality Date  . ABDOMINAL HYSTERECTOMY    . CARPAL TUNNEL RELEASE Right   . DIAGNOSTIC LAPAROSCOPY    . EXCISION MASS LOWER EXTREMETIES Left 06/21/2018   Procedure: Excision of left upper leg fat necrosis;  Surgeon: Wallace Going, DO;  Location: [place];  Service: Plastics;  Laterality: Left;  . FOOT SURGERY Right    bone removed from great toe   . GASTRIC ROUX-EN-Y N/A 05/16/2016   Procedure: LAPAROSCOPIC ROUX-EN-Y GASTRIC BYPASS WITH UPPER ENDOSCOPY;  Surgeon: Clovis Riley, MD;  [facility];  Service: General;  Laterality: N/A;  . LAPAROSCOPIC VAGINAL HYSTERECTOMY  03/14/2007  . LIPOMA EXCISION Left 08/26/2016   Procedure: EXCISION LIPOMA FROM  LEFT UPPER POSTERIOR THIGH;  Surgeon: Clovis Riley, MD;  Location: [place];  Service: General;  Laterality: Left;  . MYRINGOTOMY WITH TUBE PLACEMENT Bilateral 03/02/2015   Procedure: MYRINGOTOMY WITH T-TUBE PLACEMENT;  Surgeon: Leta Baptist, MD;  Location: [place];  Service: ENT;  Laterality: Bilateral;  . NASAL SEPTOPLASTY W/ TURBINOPLASTY  02/22/2011   Procedure: NASAL SEPTOPLASTY WITH TURBINATE REDUCTION;  Surgeon: Ascencion Dike, MD;  Location: [place];  Service: ENT;  Laterality: Bilateral;  . TUBAL LIGATION  12/16/2004  . TYMPANOSTOMY TUBE PLACEMENT Bilateral 07/21/2017    There were no vitals filed for this visit.  Subjective Assessment - 08/23/18 1153    Subjective  "Feeling ok today"    Patient Stated Goals  lessen HA, UE strength    Currently in Pain?  Yes    Pain Score  3     Pain Location  Neck    Pain Orientation  Right                       [facility] Adult PT Treatment/Exercise - 08/23/18 0001      Neck Exercises: Machines for Strengthening   Nustep  level 5 x 6 minutes    Cybex Row  20# 2x10    Lat Pull  20# 2x10  Other Machines for Strengthening  leg press 30# 2x10    Other Machines for Strengthening  leg curls 20# 2x10      Neck Exercises: Seated   Other Seated Exercise  2# bent over row, extnesions and reverse flies    Other Seated Exercise  LAQ x 10 no weight       Neck Exercises: Supine   Neck Retraction  20 reps    Neck Retraction Limitations  head on ball      Moist Heat Therapy   Number Minutes Moist Heat  15 Minutes    Moist Heat Location  Cervical      Electrical Stimulation   Electrical Stimulation Location  bilateral c/t area    Electrical Stimulation Action  IFC    Electrical Stimulation Parameters  supine    Electrical Stimulation Goals  Pain               PT Short Term Goals - 06/14/18 1131      PT SHORT TERM GOAL #1   Title  independent with initial HEP    Status  Achieved         PT Long Term Goals - 08/16/18 1650      PT LONG TERM GOAL #1   Title  understand proper posture and body mechanics    Time  8    Status  Achieved      PT LONG TERM GOAL #2   Title  report HA frequency decreased 50%    Status  On-going      PT LONG TERM GOAL #3   Title  report HA intensity decreased 25%    Status  On-going            Plan - 08/23/18 1226    Clinical Impression Statement  Tightness and upper traps and cervical area remains. Tactile cues with neck retraction not to lean into wall with entire body. Postural cues will all bent over exercises not to have rounded posture. Pt was very weak with LAQ and in the upper back.    Stability/Clinical Decision Making  Stable/Uncomplicated    Rehab Potential  Good    PT Frequency  2x / week    PT Duration  8 weeks    PT Treatment/Interventions  ADLs/Self Care Home Management;Cryotherapy;Electrical Stimulation;Moist Heat;Traction;Ultrasound;Patient/family education;Manual techniques;Dry needling    PT Next Visit Plan  continue to add exercises as tolerated       Patient will benefit from skilled therapeutic intervention in order to improve the following deficits and impairments:  Increased muscle spasms, Improper body mechanics, Decreased range of motion, Decreased strength, Postural dysfunction, Pain  Visit Diagnosis: 1. Cervicalgia   2. Chronic tension-type headache, intractable   3. Cramp and spasm        Problem List Patient Active Problem List   Diagnosis Date Noted  . Hematoma of leg 03/02/2018  . Chronic migraine without aura, with intractable migraine, so stated, with status migrainosus 06/18/2017  . GAD (generalized anxiety disorder) 04/16/2013  . Bipolar 1 disorder, depressed ([place]) 04/16/2013    Scot Jun, PTA 08/23/2018, 12:30 PM  [place], [place], [address] Phone: [phone]   Fax:   [phone]  Name: ROWEN HUR MRN: [id number] Date of Birth: July 28, 1970

## 2018-08-27 ENCOUNTER — Other Ambulatory Visit: Payer: Self-pay

## 2018-08-27 ENCOUNTER — Ambulatory Visit (INDEPENDENT_AMBULATORY_CARE_PROVIDER_SITE_OTHER): Payer: BC Managed Care – PPO | Admitting: Neurology

## 2018-08-27 ENCOUNTER — Telehealth: Payer: Self-pay | Admitting: Neurology

## 2018-08-27 DIAGNOSIS — G43711 Chronic migraine without aura, intractable, with status migrainosus: Secondary | ICD-10-CM

## 2018-08-27 MED ORDER — NURTEC 75 MG PO TBDP: 75.0000 mg | ORAL_TABLET | Freq: Every day | ORAL | 6 refills | Status: DC | PRN

## 2018-08-27 NOTE — Telephone Encounter (Signed)
3 mo inj. ahern

## 2018-08-27 NOTE — Progress Notes (Signed)
Consent Form Botulism Toxin Injection For Chronic Migraine  Interval history 08/27/2018: Baseline 15 headache days a month and 10 migraine days a month. Excellent response, she has > 70% reduction in migraines and headaches monthly. Discussed acute management prescribed frova, zembrace works quickly but the migraines rebound. Did great on Nurtec will prescribe.   Meds ordered this encounter  Medications  . Rimegepant Sulfate (NURTEC) 75 MG TBDP    Sig: Take 75 mg by mouth daily as needed. For migraines. Take as close to onset of migraine as possible. One daily maximum.    Dispense:  10 tablet    Refill:  6    Patient has a copay card    Patient felt her forehead and eyebrows were uncomfortable due to inability to move them, reduced injections 1/2 injections in the corrigators and procerus, did the frontal very high and was much better. +10 each masseter, +5 each lateral pterygoid, +5 bilat levator scapulae. +temples.  Lum Babe at [facility] at [facility] patient requests for Cervical Myofascial Pain syndrome. Dry needling gave her a very bad migraine will not order that again.   Frova was approved but too expensive. Using sumatriptan injection and then amerge if needed sue to having a longer 1/2 life. Reordered PT for cervico-myofasial pain syndrome and Ubrelvy as adjunct to acute management.    No orders of the defined types were placed in this encounter.  No orders of the defined types were placed in this encounter.   Reviewed orally with patient, additionally signature is on file:  Botulism toxin has been approved by the Federal drug administration for treatment of chronic migraine. Botulism toxin does not cure chronic migraine and it may not be effective in some patients.  The administration of botulism toxin is accomplished by injecting a small amount of toxin into the muscles of the neck and head. Dosage must be titrated for each individual. Any benefits resulting  from botulism toxin tend to wear off after 3 months with a repeat injection required if benefit is to be maintained. Injections are usually done every 3-4 months with maximum effect peak achieved by about 2 or 3 weeks. Botulism toxin is expensive and you should be sure of what costs you will incur resulting from the injection.  The side effects of botulism toxin use for chronic migraine may include:   -Transient, and usually mild, facial weakness with facial injections  -Transient, and usually mild, head or neck weakness with head/neck injections  -Reduction or loss of forehead facial animation due to forehead muscle weakness  -Eyelid drooping  -Dry eye  -Pain at the site of injection or bruising at the site of injection  -Double vision  -Potential unknown long term risks  Contraindications: You should not have Botox if you are pregnant, nursing, allergic to albumin, have an infection, skin condition, or muscle weakness at the site of the injection, or have myasthenia gravis, Lambert-Eaton syndrome, or ALS.  It is also possible that as with any injection, there may be an allergic reaction or no effect from the medication. Reduced effectiveness after repeated injections is sometimes seen and rarely infection at the injection site may occur. All care will be taken to prevent these side effects. If therapy is given over a long time, atrophy and wasting in the muscle injected may occur. Occasionally the patient's become refractory to treatment because they develop antibodies to the toxin. In this event, therapy needs to be modified.  I have read the above  information and consent to the administration of botulism toxin.    BOTOX PROCEDURE NOTE FOR MIGRAINE HEADACHE    Contraindications and precautions discussed with patient(above). Aseptic procedure was observed and patient tolerated procedure. Procedure performed by Dr. Georgia Dom  The condition has existed for more than 6 months, and pt does  not have a diagnosis of ALS, Myasthenia Gravis or Lambert-Eaton Syndrome.  Risks and benefits of injections discussed and pt agrees to proceed with the procedure.  Written consent obtained  These injections are medically necessary. Pt  receives good benefits from these injections. These injections do not cause sedations or hallucinations which the oral therapies may cause.  Indication/Diagnosis: chronic migraine BOTOX(J0585) injection was performed according to protocol by [employer]. 200 units of BOTOX was dissolved into 4 cc NS.   NDC: [address]-42   Description of procedure:  The patient was placed in a sitting position. The standard protocol was used for Botox as follows, with 5 units of Botox injected at each site:   -Procerus muscle, midline injection 2.5 units   -Corrugator muscle, bilateral injection 2.5 units each  -Frontalis muscle, bilateral injection, with 2 sites each side, medial injection was performed in the upper one third of the frontalis muscle, in the region vertical from the medial inferior edge of the superior orbital rim. The lateral injection was again in the upper one third of the forehead vertically above the lateral limbus of the cornea, 1.5 cm lateral to the medial injection site.  - Levator Scapulae: 5 units bilaterally   -Temporalis muscle injection, 5 sites, bilaterally. The first injection was 3 cm above the tragus of the ear, second injection site was 1.5 cm to 3 cm up from the first injection site in line with the tragus of the ear. The third injection site was 1.5-3 cm forward between the first 2 injection sites. The fourth injection site was 1.5 cm posterior to the second injection site. 5th site laterally in the temporalis at the level of the outer canthus.  - Patient feels her clenching is a trigger for headaches. +10 units masseter bilaterally and +5 units bilater lateral pterygoids.  -Occipitalis muscle injection, 3 sites, bilaterally. The first  injection was done one half way between the occipital protuberance and the tip of the mastoid process behind the ear. The second injection site was done lateral and superior to the first, 1 fingerbreadth from the first injection. The third injection site was 1 fingerbreadth superiorly and medially from the first injection site.  -Cervical paraspinal muscle injection, 2 sites, bilateral knee first injection site was 1 cm from the midline of the cervical spine, 3 cm inferior to the lower border of the occipital protuberance. The second injection site was 1.5 cm superiorly and laterally to the first injection site.  -Trapezius muscle injection was performed at 3 sites, bilaterally. The first injection site was in the upper trapezius muscle halfway between the inflection point of the neck, and the acromion. The second injection site was one half way between the acromion and the first injection site. The third injection was done between the first injection site and the inflection point of the neck.   Will return for repeat injection in 3 months.   198 unit sof Botox was used, 198 units were injected, the rest of the Botox was wasted. The patient tolerated the procedure well, there were no complications of the above procedure.

## 2018-08-27 NOTE — Progress Notes (Signed)
Botox- 200 units x 1 vial Lot: [id number] Expiration: 03/2021 NDC: 9276-3943-20  Bacteriostatic 0.9% Sodium Chloride- 63mL total Lot: [id number] Expiration: 10/11/2018 NDC: 4619-0122-24  Dx: V14.643 B/B

## 2018-08-28 ENCOUNTER — Ambulatory Visit: Payer: BC Managed Care – PPO | Admitting: Physical Therapy

## 2018-08-28 ENCOUNTER — Telehealth: Payer: Self-pay

## 2018-08-28 ENCOUNTER — Encounter: Payer: Self-pay | Admitting: Physical Therapy

## 2018-08-28 ENCOUNTER — Other Ambulatory Visit: Payer: Self-pay

## 2018-08-28 DIAGNOSIS — G44221 Chronic tension-type headache, intractable: Secondary | ICD-10-CM | POA: Diagnosis not present

## 2018-08-28 DIAGNOSIS — R252 Cramp and spasm: Secondary | ICD-10-CM

## 2018-08-28 DIAGNOSIS — M542 Cervicalgia: Secondary | ICD-10-CM | POA: Diagnosis not present

## 2018-08-28 NOTE — Therapy (Signed)
[place], [place], [address] Phone: [phone]   Fax:  [phone]  Physical Therapy Treatment  Patient Details  Name: Margaret Lopez MRN: [id number] Date of Birth: 28-Mar-1970 Referring Provider (PT): Jaynee Eagles   Encounter Date: 08/28/2018  PT End of Session - 08/28/18 1628    Visit Number  13    Date for PT Re-Evaluation  09/09/18    PT Start Time  1600    PT Stop Time  5102    PT Time Calculation (min)  39 min       Past Medical History:  Diagnosis Date  . Anxiety   . Asthma   . Bipolar disorder ([place])   . Depression   . Deviated nasal septum 02/2011  . Generalized anxiety disorder 04/16/2013  . GERD (gastroesophageal reflux disease)   . Headache(784.0)    migraines  . Insomnia   . Nasal turbinate hypertrophy 02/2011   bilat.  . Neuromuscular disorder [facility])    Dr. Jerilynn Mages. Domingo Cocking- does injections around nerve in her head for prevention of migraines - q 3 months   . Pneumonia    hx of   . PONV (postoperative nausea and vomiting)   . Seasonal allergies     Past Surgical History:  Procedure Laterality Date  . ABDOMINAL HYSTERECTOMY    . CARPAL TUNNEL RELEASE Right   . DIAGNOSTIC LAPAROSCOPY    . EXCISION MASS LOWER EXTREMETIES Left 06/21/2018   Procedure: Excision of left upper leg fat necrosis;  Surgeon: Wallace Going, DO;  Location: [place];  Service: Plastics;  Laterality: Left;  . FOOT SURGERY Right    bone removed from great toe   . GASTRIC ROUX-EN-Y N/A 05/16/2016   Procedure: LAPAROSCOPIC ROUX-EN-Y GASTRIC BYPASS WITH UPPER ENDOSCOPY;  Surgeon: Clovis Riley, MD;  [facility];  Service: General;  Laterality: N/A;  . LAPAROSCOPIC VAGINAL HYSTERECTOMY  03/14/2007  . LIPOMA EXCISION Left 08/26/2016   Procedure: EXCISION LIPOMA FROM LEFT UPPER POSTERIOR THIGH;  Surgeon: Clovis Riley, MD;  Location: [place];  Service: General;  Laterality: Left;  .  MYRINGOTOMY WITH TUBE PLACEMENT Bilateral 03/02/2015   Procedure: MYRINGOTOMY WITH T-TUBE PLACEMENT;  Surgeon: Leta Baptist, MD;  Location: [place];  Service: ENT;  Laterality: Bilateral;  . NASAL SEPTOPLASTY W/ TURBINOPLASTY  02/22/2011   Procedure: NASAL SEPTOPLASTY WITH TURBINATE REDUCTION;  Surgeon: Ascencion Dike, MD;  Location: [place];  Service: ENT;  Laterality: Bilateral;  . TUBAL LIGATION  12/16/2004  . TYMPANOSTOMY TUBE PLACEMENT Bilateral 07/21/2017    There were no vitals filed for this visit.  Subjective Assessment - 08/28/18 1600    Subjective  " I am struggling today, My head is killing me"    Patient Stated Goals  lessen HA, UE strength    Pain Score  8     Pain Location  Head    Pain Descriptors / Indicators  Headache                       [facility] Adult PT Treatment/Exercise - 08/28/18 0001      Modalities   Modalities  Cryotherapy;Electrical Stimulation;Ultrasound      Cryotherapy   Number Minutes Cryotherapy  15 Minutes    Cryotherapy Location  Cervical    Type of Cryotherapy  Ice pack      Theme park manager  bilateral c/t area    Electrical Stimulation Action  IFC    Electrical Stimulation Parameters  supine    Electrical Stimulation Goals  Pain      Ultrasound   Ultrasound Location  Upper traps     Ultrasound Parameters  100% 1.1w/cm2    Ultrasound Goals  Pain      Manual Therapy   Manual Therapy  Soft tissue mobilization;Manual Traction    Soft tissue mobilization  upper traps and the cervical parapsinals               PT Short Term Goals - 06/14/18 1131      PT SHORT TERM GOAL #1   Title  independent with initial HEP    Status  Achieved        PT Long Term Goals - 08/16/18 1650      PT LONG TERM GOAL #1   Title  understand proper posture and body mechanics    Time  8    Status  Achieved      PT LONG TERM GOAL #2   Title  report HA frequency  decreased 50%    Status  On-going      PT LONG TERM GOAL #3   Title  report HA intensity decreased 25%    Status  On-going            Plan - 08/28/18 1632    Clinical Impression Statement  Pt enters clinic reporting an intense headache. She had difficulty laying in the supine position for [place]. Modalities use to assist with pain.    Stability/Clinical Decision Making  Stable/Uncomplicated    Rehab Potential  Good    PT Frequency  2x / week    PT Duration  8 weeks    PT Treatment/Interventions  ADLs/Self Care Home Management;Cryotherapy;Electrical Stimulation;Moist Heat;Traction;Ultrasound;Patient/family education;Manual techniques;Dry needling    PT Next Visit Plan  Assess Tx. continue to add exercises as tolerated       Patient will benefit from skilled therapeutic intervention in order to improve the following deficits and impairments:  Increased muscle spasms, Improper body mechanics, Decreased range of motion, Decreased strength, Postural dysfunction, Pain  Visit Diagnosis: 1. Cramp and spasm   2. Chronic tension-type headache, intractable        Problem List Patient Active Problem List   Diagnosis Date Noted  . Hematoma of leg 03/02/2018  . Chronic migraine without aura, with intractable migraine, so stated, with status migrainosus 06/18/2017  . GAD (generalized anxiety disorder) 04/16/2013  . Bipolar 1 disorder, depressed ([place]) 04/16/2013    Scot Jun, PTA 08/28/2018, 4:34 PM  [place], [place], [address] Phone: [phone]   Fax:  [phone]  Name: ALINDA EGOLF MRN: [id number] Date of Birth: 1970/05/06

## 2018-08-28 NOTE — Telephone Encounter (Signed)
PA done on Nurtec on cover my meds.Your information has been submitted to [place]. [employer] will review the request and fax you a determination directly, typically within 3 business days of your submission once all necessary information is received. If [employer] [place] has not responded in 3 business days or if you have any questions about your submission, contact [place] at [phone].

## 2018-08-28 NOTE — Telephone Encounter (Signed)
I called to schedule the patient but she did not answer so I left a VM asking her to call us back. DW

## 2018-08-30 ENCOUNTER — Encounter: Payer: Self-pay | Admitting: Physical Therapy

## 2018-08-30 ENCOUNTER — Other Ambulatory Visit: Payer: Self-pay

## 2018-08-30 ENCOUNTER — Other Ambulatory Visit: Payer: Self-pay | Admitting: Neurology

## 2018-08-30 ENCOUNTER — Ambulatory Visit: Payer: BC Managed Care – PPO | Admitting: Physical Therapy

## 2018-08-30 DIAGNOSIS — G44221 Chronic tension-type headache, intractable: Secondary | ICD-10-CM | POA: Diagnosis not present

## 2018-08-30 DIAGNOSIS — M542 Cervicalgia: Secondary | ICD-10-CM | POA: Diagnosis not present

## 2018-08-30 DIAGNOSIS — R252 Cramp and spasm: Secondary | ICD-10-CM | POA: Diagnosis not present

## 2018-08-30 NOTE — Therapy (Signed)
[place], [place], [address] Phone: [phone]   Fax:  [phone]  Physical Therapy Treatment  Patient Details  Name: Margaret Lopez MRN: [id number] Date of Birth: April 17, 1970 Referring Provider (PT): Jaynee Eagles   Encounter Date: 08/30/2018  PT End of Session - 08/30/18 1140    Visit Number  14    Date for PT Re-Evaluation  09/09/18    PT Start Time  1101    PT Stop Time  1141    PT Time Calculation (min)  40 min    Activity Tolerance  Patient tolerated treatment well    Behavior During Therapy  [facility] for tasks assessed/performed;Anxious       Past Medical History:  Diagnosis Date  . Anxiety   . Asthma   . Bipolar disorder ([place])   . Depression   . Deviated nasal septum 02/2011  . Generalized anxiety disorder 04/16/2013  . GERD (gastroesophageal reflux disease)   . Headache(784.0)    migraines  . Insomnia   . Nasal turbinate hypertrophy 02/2011   bilat.  . Neuromuscular disorder [facility])    Dr. Jerilynn Mages. Domingo Cocking- does injections around nerve in her head for prevention of migraines - q 3 months   . Pneumonia    hx of   . PONV (postoperative nausea and vomiting)   . Seasonal allergies     Past Surgical History:  Procedure Laterality Date  . ABDOMINAL HYSTERECTOMY    . CARPAL TUNNEL RELEASE Right   . DIAGNOSTIC LAPAROSCOPY    . EXCISION MASS LOWER EXTREMETIES Left 06/21/2018   Procedure: Excision of left upper leg fat necrosis;  Surgeon: Wallace Going, DO;  Location: [place];  Service: Plastics;  Laterality: Left;  . FOOT SURGERY Right    bone removed from great toe   . GASTRIC ROUX-EN-Y N/A 05/16/2016   Procedure: LAPAROSCOPIC ROUX-EN-Y GASTRIC BYPASS WITH UPPER ENDOSCOPY;  Surgeon: Clovis Riley, MD;  [facility];  Service: General;  Laterality: N/A;  . LAPAROSCOPIC VAGINAL HYSTERECTOMY  03/14/2007  . LIPOMA EXCISION Left 08/26/2016   Procedure: EXCISION LIPOMA FROM  LEFT UPPER POSTERIOR THIGH;  Surgeon: Clovis Riley, MD;  Location: [place];  Service: General;  Laterality: Left;  . MYRINGOTOMY WITH TUBE PLACEMENT Bilateral 03/02/2015   Procedure: MYRINGOTOMY WITH T-TUBE PLACEMENT;  Surgeon: Leta Baptist, MD;  Location: [place];  Service: ENT;  Laterality: Bilateral;  . NASAL SEPTOPLASTY W/ TURBINOPLASTY  02/22/2011   Procedure: NASAL SEPTOPLASTY WITH TURBINATE REDUCTION;  Surgeon: Ascencion Dike, MD;  Location: [place];  Service: ENT;  Laterality: Bilateral;  . TUBAL LIGATION  12/16/2004  . TYMPANOSTOMY TUBE PLACEMENT Bilateral 07/21/2017    There were no vitals filed for this visit.  Subjective Assessment - 08/30/18 1106    Subjective  "I am feeling a lot better today"    Patient Stated Goals  lessen HA, UE strength    Currently in Pain?  No/denies         [facility] PT Assessment - 08/30/18 0001      AROM   Overall AROM Comments  cervical ROM WFL                   [facility] Adult PT Treatment/Exercise - 08/30/18 0001      Neck Exercises: Machines for Strengthening   UBE (Upper Arm Bike)  L1 x 2 min each    Cybex Row  20# 2x10    Lat Pull  20# 2x10    Other Machines for Strengthening  leg press 30# 2x10      Neck Exercises: Standing   Neck Retraction  10 reps;3 secs   x2   Other Standing Exercises  Shoulder Ext 5lb pulley 2x10       Neck Exercises: Seated   Other Seated Exercise  2# bent over row, extnesions and reverse flies    Other Seated Exercise  LAQ x 10 no weight       Ultrasound   Ultrasound Location  Upper traps    Ultrasound Parameters  100% 1.1w/cm2     Ultrasound Goals  Pain               PT Short Term Goals - 06/14/18 1131      PT SHORT TERM GOAL #1   Title  independent with initial HEP    Status  Achieved        PT Long Term Goals - 08/30/18 1117      PT LONG TERM GOAL #1   Title  understand proper posture and body mechanics    Status  Achieved      PT LONG TERM  GOAL #2   Title  report HA frequency decreased 50%    Status  On-going      PT LONG TERM GOAL #3   Title  report HA intensity decreased 25%    Status  On-going      PT LONG TERM GOAL #4   Title  increase UE strength to 4/5      PT LONG TERM GOAL #5   Title  incresae cervical ROM 25%    Status  Achieved            Plan - 08/30/18 1141    Clinical Impression Statement  Pt with some improvement today, she was able to return to some strengthening exercises. She has progressed increasing her cervical ROM. Postural cues to keep shoulder back with both seated and bent over rows. Tactile cues to her upper back with cervical retractions to keep her from leaning her entire body into the wall.    Stability/Clinical Decision Making  Stable/Uncomplicated    Rehab Potential  Good    PT Frequency  2x / week    PT Treatment/Interventions  ADLs/Self Care Home Management;Cryotherapy;Electrical Stimulation;Moist Heat;Traction;Ultrasound;Patient/family education;Manual techniques;Dry needling    PT Next Visit Plan  Assess Tx. continue to add exercises as tolerated       Patient will benefit from skilled therapeutic intervention in order to improve the following deficits and impairments:  Increased muscle spasms, Improper body mechanics, Decreased range of motion, Decreased strength, Postural dysfunction, Pain  Visit Diagnosis: Chronic tension-type headache, intractable  Cramp and spasm  Cervicalgia     Problem List Patient Active Problem List   Diagnosis Date Noted  . Hematoma of leg 03/02/2018  . Chronic migraine without aura, with intractable migraine, so stated, with status migrainosus 06/18/2017  . GAD (generalized anxiety disorder) 04/16/2013  . Bipolar 1 disorder, depressed ([place]) 04/16/2013    Scot Jun, PTA 08/30/2018, 11:43 AM  [place] [facility] [place], [place], [address] Phone: [phone]    Fax:  [phone]  Name: Margaret Lopez MRN: [id number] Date of Birth: December 13, 1970

## 2018-08-31 NOTE — Telephone Encounter (Signed)
Nikki E @ [employer] has called to inform of the approval of the [employer] 75 mg effective date for approval is 08-28-18 to 11-19-18.  Call back # is [phone] option 3 and option 1 if there are questions

## 2018-09-04 ENCOUNTER — Ambulatory Visit: Payer: BC Managed Care – PPO | Admitting: Physical Therapy

## 2018-09-04 ENCOUNTER — Encounter: Payer: Self-pay | Admitting: Physical Therapy

## 2018-09-04 ENCOUNTER — Other Ambulatory Visit: Payer: Self-pay

## 2018-09-04 DIAGNOSIS — M542 Cervicalgia: Secondary | ICD-10-CM

## 2018-09-04 DIAGNOSIS — R252 Cramp and spasm: Secondary | ICD-10-CM | POA: Diagnosis not present

## 2018-09-04 DIAGNOSIS — G44221 Chronic tension-type headache, intractable: Secondary | ICD-10-CM

## 2018-09-04 NOTE — Therapy (Signed)
[address] [address] [place], [place], [address] Phone: [phone]   Fax:  [phone]  Physical Therapy Treatment  Patient Details  Name: Margaret Lopez MRN: [id number] Date of Birth: Jul 11, 1970 Referring Provider (PT): Jaynee Eagles   Encounter Date: 09/04/2018  PT End of Session - 09/04/18 1723    Visit Number  15    Number of Visits  30    Date for PT Re-Evaluation  09/09/18    PT Start Time  0321    PT Stop Time  1724    PT Time Calculation (min)  41 min    Activity Tolerance  Patient tolerated treatment well    Behavior During Therapy  [facility] for tasks assessed/performed;Anxious       Past Medical History:  Diagnosis Date  . Anxiety   . Asthma   . Bipolar disorder ([place])   . Depression   . Deviated nasal septum 02/2011  . Generalized anxiety disorder 04/16/2013  . GERD (gastroesophageal reflux disease)   . Headache(784.0)    migraines  . Insomnia   . Nasal turbinate hypertrophy 02/2011   bilat.  . Neuromuscular disorder [facility])    Dr. Jerilynn Mages. Domingo Cocking- does injections around nerve in her head for prevention of migraines - q 3 months   . Pneumonia    hx of   . PONV (postoperative nausea and vomiting)   . Seasonal allergies     Past Surgical History:  Procedure Laterality Date  . ABDOMINAL HYSTERECTOMY    . CARPAL TUNNEL RELEASE Right   . DIAGNOSTIC LAPAROSCOPY    . EXCISION MASS LOWER EXTREMETIES Left 06/21/2018   Procedure: Excision of left upper leg fat necrosis;  Surgeon: Wallace Going, DO;  Location: [place];  Service: Plastics;  Laterality: Left;  . FOOT SURGERY Right    bone removed from great toe   . GASTRIC ROUX-EN-Y N/A 05/16/2016   Procedure: LAPAROSCOPIC ROUX-EN-Y GASTRIC BYPASS WITH UPPER ENDOSCOPY;  Surgeon: Clovis Riley, MD;  [facility];  Service: General;  Laterality: N/A;  . LAPAROSCOPIC VAGINAL HYSTERECTOMY  03/14/2007  . LIPOMA EXCISION Left 08/26/2016   Procedure: EXCISION LIPOMA FROM LEFT UPPER POSTERIOR THIGH;  Surgeon: Clovis Riley, MD;  Location: [place];  Service: General;  Laterality: Left;  . MYRINGOTOMY WITH TUBE PLACEMENT Bilateral 03/02/2015   Procedure: MYRINGOTOMY WITH T-TUBE PLACEMENT;  Surgeon: Leta Baptist, MD;  Location: [place];  Service: ENT;  Laterality: Bilateral;  . NASAL SEPTOPLASTY W/ TURBINOPLASTY  02/22/2011   Procedure: NASAL SEPTOPLASTY WITH TURBINATE REDUCTION;  Surgeon: Ascencion Dike, MD;  Location: [place];  Service: ENT;  Laterality: Bilateral;  . TUBAL LIGATION  12/16/2004  . TYMPANOSTOMY TUBE PLACEMENT Bilateral 07/21/2017    There were no vitals filed for this visit.  Subjective Assessment - 09/04/18 1647    Subjective  Patient reports that she is feeling good, no HA's    Currently in Pain?  Yes    Pain Score  1     Pain Location  Neck    Pain Orientation  Right    Pain Relieving Factors  treatment, botox injections                       [facility] Adult PT Treatment/Exercise - 09/04/18 0001      Neck Exercises: Machines for Strengthening   Cybex Row  25# 2x10    Lat Pull  25# 2x10    Other Machines for Strengthening  leg press 30# 2x10    Other Machines for Strengthening  leg curls 20# 2x10, leg extension 5# 2x10      Neck Exercises: Theraband   Other Theraband Exercises  biceps curls 10lb 2x10 , 5# straight arm pulleys with cues for core activation    Other Theraband Exercises  triceps ext 20 lb 2x10       Neck Exercises: Standing   Other Standing Exercises  overhead weighted ball lift    Other Standing Exercises  5# shrugs and upper trap and levator stretches      Neck Exercises: Seated   W Back  20 reps      Neck Exercises: Supine   Other Supine Exercise  isometric abdominals               PT Short Term Goals - 06/14/18 1131      PT SHORT TERM GOAL #1   Title  independent with initial HEP    Status  Achieved        PT Long Term  Goals - 09/04/18 1728      PT LONG TERM GOAL #2   Title  report HA frequency decreased 50%    Status  Partially Met            Plan - 09/04/18 1724    Clinical Impression Statement  Patient is feeling better and wnating to try to get back to the fitness and strength.  She tolerated this well, the left leg did hurt some with the exericses this is the leg that had a surgery recently.  She was fatigued at the end of this session, but no increase of pain or HA    PT Next Visit Plan  Assess Tx. continue to add exercises as tolerated    Consulted and Agree with Plan of Care  Patient       Patient will benefit from skilled therapeutic intervention in order to improve the following deficits and impairments:  Increased muscle spasms, Improper body mechanics, Decreased range of motion, Decreased strength, Postural dysfunction, Pain  Visit Diagnosis: Chronic tension-type headache, intractable  Cramp and spasm  Cervicalgia     Problem List Patient Active Problem List   Diagnosis Date Noted  . Hematoma of leg 03/02/2018  . Chronic migraine without aura, with intractable migraine, so stated, with status migrainosus 06/18/2017  . GAD (generalized anxiety disorder) 04/16/2013  . Bipolar 1 disorder, depressed ([place]) 04/16/2013    Margaret Lopez., PT 09/04/2018, 5:28 PM  [place] [address] [place], [place], [address] Phone: [phone]   Fax:  [phone]  Name: Margaret Lopez MRN: [id number] Date of Birth: [birth year]

## 2018-09-06 ENCOUNTER — Encounter: Payer: Self-pay | Admitting: Physical Therapy

## 2018-09-06 ENCOUNTER — Ambulatory Visit: Payer: BC Managed Care – PPO | Admitting: Physical Therapy

## 2018-09-06 ENCOUNTER — Other Ambulatory Visit: Payer: Self-pay

## 2018-09-06 DIAGNOSIS — M542 Cervicalgia: Secondary | ICD-10-CM

## 2018-09-06 DIAGNOSIS — G44221 Chronic tension-type headache, intractable: Secondary | ICD-10-CM

## 2018-09-06 DIAGNOSIS — R252 Cramp and spasm: Secondary | ICD-10-CM | POA: Diagnosis not present

## 2018-09-06 NOTE — Therapy (Signed)
Margaret Lopez, [place], [address] Phone: [phone]   Fax:  [phone]  Physical Therapy Treatment  Patient Details  Name: Margaret Lopez MRN: [id number] Date of Birth: [birth year] Referring Provider (PT): Jaynee Eagles   Encounter Date: 09/06/2018  PT End of Session - 09/06/18 1738    Visit Number  16    Number of Visits  30    Date for PT Re-Evaluation  09/09/18    PT Start Time  2751    PT Stop Time  1736    PT Time Calculation (min)  43 min    Activity Tolerance  Patient tolerated treatment well    Behavior During Therapy  [facility] for tasks assessed/performed;Anxious       Past Medical History:  Diagnosis Date  . Anxiety   . Asthma   . Bipolar disorder ([place])   . Depression   . Deviated nasal septum 02/2011  . Generalized anxiety disorder 04/16/2013  . GERD (gastroesophageal reflux disease)   . Headache(784.0)    migraines  . Insomnia   . Nasal turbinate hypertrophy 02/2011   bilat.  . Neuromuscular disorder [facility])    Dr. Jerilynn Mages. Domingo Cocking- does injections around nerve in her head for prevention of migraines - q 3 months   . Pneumonia    hx of   . PONV (postoperative nausea and vomiting)   . Seasonal allergies     Past Surgical History:  Procedure Laterality Date  . ABDOMINAL HYSTERECTOMY    . CARPAL TUNNEL RELEASE Right   . DIAGNOSTIC LAPAROSCOPY    . EXCISION MASS LOWER EXTREMETIES Left 06/21/2018   Procedure: Excision of left upper leg fat necrosis;  Surgeon: Wallace Going, DO;  Location: [place];  Service: Plastics;  Laterality: Left;  . FOOT SURGERY Right    bone removed from great toe   . GASTRIC ROUX-EN-Y N/A 05/16/2016   Procedure: LAPAROSCOPIC ROUX-EN-Y GASTRIC BYPASS WITH UPPER ENDOSCOPY;  Surgeon: Clovis Riley, MD;  [facility];  Service: General;  Laterality: N/A;  . LAPAROSCOPIC VAGINAL HYSTERECTOMY  03/14/2007  . LIPOMA EXCISION Left 08/26/2016   Procedure: EXCISION LIPOMA FROM LEFT UPPER POSTERIOR THIGH;  Surgeon: Clovis Riley, MD;  Location: [place];  Service: General;  Laterality: Left;  . MYRINGOTOMY WITH TUBE PLACEMENT Bilateral 03/02/2015   Procedure: MYRINGOTOMY WITH T-TUBE PLACEMENT;  Surgeon: Leta Baptist, MD;  Location: [place];  Service: ENT;  Laterality: Bilateral;  . NASAL SEPTOPLASTY W/ TURBINOPLASTY  02/22/2011   Procedure: NASAL SEPTOPLASTY WITH TURBINATE REDUCTION;  Surgeon: Ascencion Dike, MD;  Location: [place];  Service: ENT;  Laterality: Bilateral;  . TUBAL LIGATION  12/16/2004  . TYMPANOSTOMY TUBE PLACEMENT Bilateral 07/21/2017    There were no vitals filed for this visit.  Subjective Assessment - 09/06/18 1705    Subjective  Patient reprots that she is dizzy today    Currently in Pain?  Yes    Pain Score  3     Pain Location  Neck    Pain Orientation  Right    Aggravating Factors   stress and allergies                       [facility] Adult PT Treatment/Exercise - 09/06/18 0001      Neck Exercises: Machines for Strengthening   Nustep  level 5 x 6 minutes    Cybex Row  25# 2x10    Lat Pull  25# 2x10    Other Machines for Strengthening  leg press 30# 2x10    Other Machines for Strengthening  leg curls 20# 2x10, leg extension 5# 2x10      Neck Exercises: Theraband   Shoulder Extension  20 reps;Red    Shoulder External Rotation  20 reps;Red    Other Theraband Exercises  triceps ext 20 lb 2x10       Neck Exercises: Standing   Other Standing Exercises  overhead weighted ball lift, red tband horizontal abduction    Other Standing Exercises  5# shrugs and upper trap and levator stretches      Neck Exercises: Seated   W Back  20 reps      Manual Therapy   Manual Therapy  Soft tissue mobilization;Manual Traction    Soft tissue mobilization  upper traps and the cervical parapsinals    Manual Traction  gentle occipital release with some gentle manual shoulder  depression and stretches               PT Short Term Goals - 06/14/18 1131      PT SHORT TERM GOAL #1   Title  independent with initial HEP    Status  Achieved        PT Long Term Goals - 09/04/18 1728      PT LONG TERM GOAL #2   Title  report HA frequency decreased 50%    Status  Partially Met            Plan - 09/06/18 1739    Clinical Impression Statement  Patient had some c/o dizziness today, she felt like it was from allergies, she did seem to have the knots in the upper trap and the cervical area on the right and was very tender here today, Some movements did cause the dizziness    PT Next Visit Plan  assess the treatment of HEP and spasms    Consulted and Agree with Plan of Care  Patient       Patient will benefit from skilled therapeutic intervention in order to improve the following deficits and impairments:  Increased muscle spasms, Improper body mechanics, Decreased range of motion, Decreased strength, Postural dysfunction, Pain  Visit Diagnosis: Chronic tension-type headache, intractable  Cramp and spasm  Cervicalgia     Problem List Patient Active Problem List   Diagnosis Date Noted  . Hematoma of leg 03/02/2018  . Chronic migraine without aura, with intractable migraine, so stated, with status migrainosus 06/18/2017  . GAD (generalized anxiety disorder) 04/16/2013  . Bipolar 1 disorder, depressed ([place]) 04/16/2013    Sumner Boast., PT 09/06/2018, 5:41 PM  [place] [address] [place], [place], [address] Phone: [phone]   Fax:  [phone]  Name: Margaret Lopez MRN: [id number] Date of Birth: 1970-08-29

## 2018-09-10 ENCOUNTER — Encounter: Payer: Self-pay | Admitting: Physical Therapy

## 2018-09-10 ENCOUNTER — Ambulatory Visit: Payer: BC Managed Care – PPO | Admitting: Physical Therapy

## 2018-09-10 ENCOUNTER — Other Ambulatory Visit: Payer: Self-pay

## 2018-09-10 DIAGNOSIS — R252 Cramp and spasm: Secondary | ICD-10-CM

## 2018-09-10 DIAGNOSIS — M542 Cervicalgia: Secondary | ICD-10-CM | POA: Diagnosis not present

## 2018-09-10 DIAGNOSIS — G44221 Chronic tension-type headache, intractable: Secondary | ICD-10-CM | POA: Diagnosis not present

## 2018-09-10 NOTE — Therapy (Signed)
[place], [place], [address] Phone: [phone]   Fax:  [phone]  Physical Therapy Treatment  Patient Details  Name: Margaret Lopez MRN: [id number] Date of Birth: May 30, 1970 Referring Provider (PT): Jaynee Eagles   Encounter Date: 09/10/2018  PT End of Session - 09/10/18 1610    Visit Number  17    Number of Visits  30    Date for PT Re-Evaluation  09/09/18    PT Start Time  5284    PT Stop Time  1622    PT Time Calculation (min)  35 min    Activity Tolerance  Patient limited by pain    Behavior During Therapy  [facility] for tasks assessed/performed       Past Medical History:  Diagnosis Date  . Anxiety   . Asthma   . Bipolar disorder ([place])   . Depression   . Deviated nasal septum 02/2011  . Generalized anxiety disorder 04/16/2013  . GERD (gastroesophageal reflux disease)   . Headache(784.0)    migraines  . Insomnia   . Nasal turbinate hypertrophy 02/2011   bilat.  . Neuromuscular disorder [facility])    Dr. Jerilynn Mages. Domingo Cocking- does injections around nerve in her head for prevention of migraines - q 3 months   . Pneumonia    hx of   . PONV (postoperative nausea and vomiting)   . Seasonal allergies     Past Surgical History:  Procedure Laterality Date  . ABDOMINAL HYSTERECTOMY    . CARPAL TUNNEL RELEASE Right   . DIAGNOSTIC LAPAROSCOPY    . EXCISION MASS LOWER EXTREMETIES Left 06/21/2018   Procedure: Excision of left upper leg fat necrosis;  Surgeon: Wallace Going, DO;  Location: [place];  Service: Plastics;  Laterality: Left;  . FOOT SURGERY Right    bone removed from great toe   . GASTRIC ROUX-EN-Y N/A 05/16/2016   Procedure: LAPAROSCOPIC ROUX-EN-Y GASTRIC BYPASS WITH UPPER ENDOSCOPY;  Surgeon: Clovis Riley, MD;  [facility];  Service: General;  Laterality: N/A;  . LAPAROSCOPIC VAGINAL HYSTERECTOMY  03/14/2007  . LIPOMA EXCISION Left 08/26/2016   Procedure: EXCISION LIPOMA  FROM LEFT UPPER POSTERIOR THIGH;  Surgeon: Clovis Riley, MD;  Location: [place];  Service: General;  Laterality: Left;  . MYRINGOTOMY WITH TUBE PLACEMENT Bilateral 03/02/2015   Procedure: MYRINGOTOMY WITH T-TUBE PLACEMENT;  Surgeon: Leta Baptist, MD;  Location: [place];  Service: ENT;  Laterality: Bilateral;  . NASAL SEPTOPLASTY W/ TURBINOPLASTY  02/22/2011   Procedure: NASAL SEPTOPLASTY WITH TURBINATE REDUCTION;  Surgeon: Ascencion Dike, MD;  Location: [place];  Service: ENT;  Laterality: Bilateral;  . TUBAL LIGATION  12/16/2004  . TYMPANOSTOMY TUBE PLACEMENT Bilateral 07/21/2017    There were no vitals filed for this visit.  Subjective Assessment - 09/10/18 1548    Subjective  "I have a headache today, I had to leave work early" "It cam on very fast and very sudden"    Currently in Pain?  Yes    Pain Score  8     Pain Location  Head    Pain Orientation  Upper    Pain Descriptors / Indicators  Headache                       [facility] Adult PT Treatment/Exercise - 09/10/18 0001      Cryotherapy   Number Minutes Cryotherapy  15  Minutes    Cryotherapy Location  Cervical    Type of Cryotherapy  Ice pack      Electrical Stimulation   Electrical Stimulation Location  bilateral c/t area    Electrical Stimulation Action  IFC    Electrical Stimulation Parameters  sitting, to pt tolerance    Electrical Stimulation Goals  Pain      Ultrasound   Ultrasound Location  upper traps     Ultrasound Parameters  100%1.1w/cm2    Ultrasound Goals  Pain               PT Short Term Goals - 06/14/18 1131      PT SHORT TERM GOAL #1   Title  independent with initial HEP    Status  Achieved        PT Long Term Goals - 09/04/18 1728      PT LONG TERM GOAL #2   Title  report HA frequency decreased 50%    Status  Partially Met            Plan - 09/10/18 1611    Clinical Impression Statement  no progression towards goals. Pt enters clinic  reporting a really bad headache, pt has a history for migraines. She elected to modalities to help with pain.    Stability/Clinical Decision Making  Stable/Uncomplicated    Rehab Potential  Good    PT Frequency  2x / week    PT Duration  8 weeks    PT Treatment/Interventions  ADLs/Self Care Home Management;Cryotherapy;Electrical Stimulation;Moist Heat;Traction;Ultrasound;Patient/family education;Manual techniques;Dry needling    PT Next Visit Plan  assess the treatment, progress as tolerated       Patient will benefit from skilled therapeutic intervention in order to improve the following deficits and impairments:  Increased muscle spasms, Improper body mechanics, Decreased range of motion, Decreased strength, Postural dysfunction, Pain  Visit Diagnosis: Cramp and spasm  Cervicalgia  Chronic tension-type headache, intractable     Problem List Patient Active Problem List   Diagnosis Date Noted  . Hematoma of leg 03/02/2018  . Chronic migraine without aura, with intractable migraine, so stated, with status migrainosus 06/18/2017  . GAD (generalized anxiety disorder) 04/16/2013  . Bipolar 1 disorder, depressed ([place]) 04/16/2013    Scot Jun 09/10/2018, [place] [address] [place], [place], [address] Phone: [phone]   Fax:  [phone]  Name: Margaret Lopez MRN: [id number] Date of Birth: 05/07/1970

## 2018-09-13 ENCOUNTER — Other Ambulatory Visit: Payer: Self-pay

## 2018-09-13 ENCOUNTER — Encounter: Payer: Self-pay | Admitting: Physical Therapy

## 2018-09-13 ENCOUNTER — Ambulatory Visit: Payer: BC Managed Care – PPO | Attending: Neurology | Admitting: Physical Therapy

## 2018-09-13 DIAGNOSIS — G44221 Chronic tension-type headache, intractable: Secondary | ICD-10-CM | POA: Diagnosis not present

## 2018-09-13 DIAGNOSIS — M542 Cervicalgia: Secondary | ICD-10-CM

## 2018-09-13 DIAGNOSIS — R252 Cramp and spasm: Secondary | ICD-10-CM

## 2018-09-13 NOTE — Therapy (Signed)
[place], [place], [address] Phone: [phone]   Fax:  [phone]  Physical Therapy Treatment  Patient Details  Name: Margaret Lopez MRN: [id number] Date of Birth: 01-Aug-1970 Referring Provider (PT): Jaynee Eagles   Encounter Date: 09/13/2018  PT End of Session - 09/13/18 1336    Visit Number  18    Number of Visits  30    Date for PT Re-Evaluation  09/09/18    PT Start Time  1300    PT Stop Time  1346    PT Time Calculation (min)  46 min    Activity Tolerance  Patient limited by pain    Behavior During Therapy  [facility] for tasks assessed/performed       Past Medical History:  Diagnosis Date  . Anxiety   . Asthma   . Bipolar disorder ([place])   . Depression   . Deviated nasal septum 02/2011  . Generalized anxiety disorder 04/16/2013  . GERD (gastroesophageal reflux disease)   . Headache(784.0)    migraines  . Insomnia   . Nasal turbinate hypertrophy 02/2011   bilat.  . Neuromuscular disorder [facility])    Dr. Jerilynn Mages. Domingo Cocking- does injections around nerve in her head for prevention of migraines - q 3 months   . Pneumonia    hx of   . PONV (postoperative nausea and vomiting)   . Seasonal allergies     Past Surgical History:  Procedure Laterality Date  . ABDOMINAL HYSTERECTOMY    . CARPAL TUNNEL RELEASE Right   . DIAGNOSTIC LAPAROSCOPY    . EXCISION MASS LOWER EXTREMETIES Left 06/21/2018   Procedure: Excision of left upper leg fat necrosis;  Surgeon: Wallace Going, DO;  Location: [place];  Service: Plastics;  Laterality: Left;  . FOOT SURGERY Right    bone removed from great toe   . GASTRIC ROUX-EN-Y N/A 05/16/2016   Procedure: LAPAROSCOPIC ROUX-EN-Y GASTRIC BYPASS WITH UPPER ENDOSCOPY;  Surgeon: Clovis Riley, MD;  [facility];  Service: General;  Laterality: N/A;  . LAPAROSCOPIC VAGINAL HYSTERECTOMY  03/14/2007  . LIPOMA EXCISION Left 08/26/2016   Procedure: EXCISION LIPOMA  FROM LEFT UPPER POSTERIOR THIGH;  Surgeon: Clovis Riley, MD;  Location: [place];  Service: General;  Laterality: Left;  . MYRINGOTOMY WITH TUBE PLACEMENT Bilateral 03/02/2015   Procedure: MYRINGOTOMY WITH T-TUBE PLACEMENT;  Surgeon: Leta Baptist, MD;  Location: [place];  Service: ENT;  Laterality: Bilateral;  . NASAL SEPTOPLASTY W/ TURBINOPLASTY  02/22/2011   Procedure: NASAL SEPTOPLASTY WITH TURBINATE REDUCTION;  Surgeon: Ascencion Dike, MD;  Location: [place];  Service: ENT;  Laterality: Bilateral;  . TUBAL LIGATION  12/16/2004  . TYMPANOSTOMY TUBE PLACEMENT Bilateral 07/21/2017    There were no vitals filed for this visit.  Subjective Assessment - 09/13/18 1302    Subjective  "My neck is just really tight"    Patient Stated Goals  lessen HA, UE strength    Currently in Pain?  Yes    Pain Score  2     Pain Location  Neck                       [facility] Adult PT Treatment/Exercise - 09/13/18 0001      Neck Exercises: Machines for Strengthening   UBE (Upper Arm Bike)  L1 x 2 min each      Cryotherapy   Number  Minutes Cryotherapy  15 Minutes    Cryotherapy Location  Cervical    Type of Cryotherapy  Ice pack      Electrical Stimulation   Electrical Stimulation Location  bilateral c/t area    Electrical Stimulation Action  IFC    Electrical Stimulation Parameters  sitting, to pt tolerance    Electrical Stimulation Goals  Pain      Manual Therapy   Manual Therapy  Soft tissue mobilization;Manual Traction    Soft tissue mobilization  upper traps and the cervical parapsinals    Manual Traction  gentle occipital release with some gentle manual shoulder depression and stretches               PT Short Term Goals - 06/14/18 1131      PT SHORT TERM GOAL #1   Title  independent with initial HEP    Status  Achieved        PT Long Term Goals - 09/04/18 1728      PT LONG TERM GOAL #2   Title  report HA frequency decreased 50%     Status  Partially Met            Plan - 09/13/18 1337    Clinical Impression Statement  Pt enters clinic reporting posterior neck tightness. Agreed to a gentle warm up UBE. Some tightness noted in upper traps that improved after STM and manual traction to cervical spine. As some of the  tightness relieved pt reports a slight headache.    Stability/Clinical Decision Making  Stable/Uncomplicated    Rehab Potential  Good    PT Frequency  2x / week    PT Treatment/Interventions  ADLs/Self Care Home Management;Cryotherapy;Electrical Stimulation;Moist Heat;Traction;Ultrasound;Patient/family education;Manual techniques;Dry needling    PT Next Visit Plan  assess the treatment, progress as tolerated       Patient will benefit from skilled therapeutic intervention in order to improve the following deficits and impairments:  Increased muscle spasms, Improper body mechanics, Decreased range of motion, Decreased strength, Postural dysfunction, Pain  Visit Diagnosis: Chronic tension-type headache, intractable  Cervicalgia  Cramp and spasm     Problem List Patient Active Problem List   Diagnosis Date Noted  . Hematoma of leg 03/02/2018  . Chronic migraine without aura, with intractable migraine, so stated, with status migrainosus 06/18/2017  . GAD (generalized anxiety disorder) 04/16/2013  . Bipolar 1 disorder, depressed ([place]) 04/16/2013    Scot Jun, PTA 09/13/2018, 1:40 PM  [place], [place], [address] Phone: [phone]   Fax:  [phone]  Name: Margaret Lopez MRN: [id number] Date of Birth: 1970-07-21

## 2018-09-18 ENCOUNTER — Other Ambulatory Visit: Payer: Self-pay

## 2018-09-18 ENCOUNTER — Encounter: Payer: Self-pay | Admitting: Physical Therapy

## 2018-09-18 ENCOUNTER — Ambulatory Visit: Payer: BC Managed Care – PPO | Admitting: Physical Therapy

## 2018-09-18 DIAGNOSIS — M542 Cervicalgia: Secondary | ICD-10-CM | POA: Diagnosis not present

## 2018-09-18 DIAGNOSIS — R252 Cramp and spasm: Secondary | ICD-10-CM | POA: Diagnosis not present

## 2018-09-18 DIAGNOSIS — G44221 Chronic tension-type headache, intractable: Secondary | ICD-10-CM

## 2018-09-18 NOTE — Therapy (Signed)
[place], [place], [address] Phone: [phone]   Fax:  [phone]  Physical Therapy Treatment  Patient Details  Name: Margaret Lopez MRN: [id number] Date of Birth: 1970-09-03 Referring Provider (PT): Jaynee Eagles   Encounter Date: 09/18/2018  PT End of Session - 09/18/18 1643    Visit Number  19    Date for PT Re-Evaluation  09/09/18    PT Start Time  1600    PT Stop Time  1442    PT Time Calculation (min)  1362 min       Past Medical History:  Diagnosis Date  . Anxiety   . Asthma   . Bipolar disorder ([place])   . Depression   . Deviated nasal septum 02/2011  . Generalized anxiety disorder 04/16/2013  . GERD (gastroesophageal reflux disease)   . Headache(784.0)    migraines  . Insomnia   . Nasal turbinate hypertrophy 02/2011   bilat.  . Neuromuscular disorder [facility])    Dr. Jerilynn Mages. Domingo Cocking- does injections around nerve in her head for prevention of migraines - q 3 months   . Pneumonia    hx of   . PONV (postoperative nausea and vomiting)   . Seasonal allergies     Past Surgical History:  Procedure Laterality Date  . ABDOMINAL HYSTERECTOMY    . CARPAL TUNNEL RELEASE Right   . DIAGNOSTIC LAPAROSCOPY    . EXCISION MASS LOWER EXTREMETIES Left 06/21/2018   Procedure: Excision of left upper leg fat necrosis;  Surgeon: Wallace Going, DO;  Location: [place];  Service: Plastics;  Laterality: Left;  . FOOT SURGERY Right    bone removed from great toe   . GASTRIC ROUX-EN-Y N/A 05/16/2016   Procedure: LAPAROSCOPIC ROUX-EN-Y GASTRIC BYPASS WITH UPPER ENDOSCOPY;  Surgeon: Clovis Riley, MD;  [facility];  Service: General;  Laterality: N/A;  . LAPAROSCOPIC VAGINAL HYSTERECTOMY  03/14/2007  . LIPOMA EXCISION Left 08/26/2016   Procedure: EXCISION LIPOMA FROM LEFT UPPER POSTERIOR THIGH;  Surgeon: Clovis Riley, MD;  Location: [place];  Service: General;  Laterality: Left;  .  MYRINGOTOMY WITH TUBE PLACEMENT Bilateral 03/02/2015   Procedure: MYRINGOTOMY WITH T-TUBE PLACEMENT;  Surgeon: Leta Baptist, MD;  Location: [place];  Service: ENT;  Laterality: Bilateral;  . NASAL SEPTOPLASTY W/ TURBINOPLASTY  02/22/2011   Procedure: NASAL SEPTOPLASTY WITH TURBINATE REDUCTION;  Surgeon: Ascencion Dike, MD;  Location: [place];  Service: ENT;  Laterality: Bilateral;  . TUBAL LIGATION  12/16/2004  . TYMPANOSTOMY TUBE PLACEMENT Bilateral 07/21/2017    There were no vitals filed for this visit.  Subjective Assessment - 09/18/18 1600    Subjective  "Feeling ok today,  but still have some tightness in the R shoulder"    Patient Stated Goals  lessen HA, UE strength    Currently in Pain?  No/denies                       [facility] Adult PT Treatment/Exercise - 09/18/18 0001      Neck Exercises: Machines for Strengthening   Nustep  level 4 x 5 minutes    Other Machines for Strengthening  leg curls 15# 2x10, leg extension 5# 2x10      Neck Exercises: Theraband   Other Theraband Exercises  triceps ext 20 lb 2x10       Neck Exercises: Standing   Other Standing Exercises  Rows and ext yellow 2x10       Neck Exercises: Seated   Other Seated Exercise  2# bent over row, extnesions and reverse flies      Ultrasound   Ultrasound Location  upper R trap and scapula     Ultrasound Parameters  100% 1.1w/cm2    Ultrasound Goals  Pain               PT Short Term Goals - 06/14/18 1131      PT SHORT TERM GOAL #1   Title  independent with initial HEP    Status  Achieved        PT Long Term Goals - 09/04/18 1728      PT LONG TERM GOAL #2   Title  report HA frequency decreased 50%    Status  Partially Met            Plan - 09/18/18 1643    Clinical Impression Statement  Pt report some improvement since last session. No pain reported just some tightness in upper R trap. Postural cues required with all bent over exercises. LLE  cramping noted with HS curls, decreasing load helped with this. Tactile cues needed with standing rows. Positive response to [name].    Stability/Clinical Decision Making  Stable/Uncomplicated    Rehab Potential  Good    PT Frequency  2x / week    PT Duration  8 weeks    PT Treatment/Interventions  ADLs/Self Care Home Management;Cryotherapy;Electrical Stimulation;Moist Heat;Traction;Ultrasound;Patient/family education;Manual techniques;Dry needling    PT Next Visit Plan  assess the treatment, progress as tolerated       Patient will benefit from skilled therapeutic intervention in order to improve the following deficits and impairments:  Increased muscle spasms, Improper body mechanics, Decreased range of motion, Decreased strength, Postural dysfunction, Pain  Visit Diagnosis: Cervicalgia  Cramp and spasm  Chronic tension-type headache, intractable     Problem List Patient Active Problem List   Diagnosis Date Noted  . Hematoma of leg 03/02/2018  . Chronic migraine without aura, with intractable migraine, so stated, with status migrainosus 06/18/2017  . GAD (generalized anxiety disorder) 04/16/2013  . Bipolar 1 disorder, depressed ([place]) 04/16/2013    Scot Jun, PTA 09/18/2018, 4:46 PM  [place] [address] [place], [place], [address] Phone: [phone]   Fax:  [phone]  Name: Margaret Lopez MRN: [id number] Date of Birth: 1970/10/01

## 2018-09-20 ENCOUNTER — Encounter: Payer: Self-pay | Admitting: Physical Therapy

## 2018-09-20 ENCOUNTER — Other Ambulatory Visit: Payer: Self-pay

## 2018-09-20 ENCOUNTER — Ambulatory Visit: Payer: BC Managed Care – PPO | Admitting: Physical Therapy

## 2018-09-20 DIAGNOSIS — R252 Cramp and spasm: Secondary | ICD-10-CM | POA: Diagnosis not present

## 2018-09-20 DIAGNOSIS — G44221 Chronic tension-type headache, intractable: Secondary | ICD-10-CM

## 2018-09-20 DIAGNOSIS — M542 Cervicalgia: Secondary | ICD-10-CM | POA: Diagnosis not present

## 2018-09-20 NOTE — Therapy (Signed)
[place], [place], [address] Phone: [phone]   Fax:  [phone]  Physical Therapy Treatment  Patient Details  Name: Margaret Lopez MRN: [id number] Date of Birth: 1970/06/16 Referring Provider (PT): Jaynee Eagles   Encounter Date: 09/20/2018  PT End of Session - 09/20/18 1215    Visit Number  20    Date for PT Re-Evaluation  09/09/18    PT Start Time  1145    PT Stop Time  1230    PT Time Calculation (min)  45 min    Activity Tolerance  Patient tolerated treatment well    Behavior During Therapy  [facility] for tasks assessed/performed       Past Medical History:  Diagnosis Date  . Anxiety   . Asthma   . Bipolar disorder ([place])   . Depression   . Deviated nasal septum 02/2011  . Generalized anxiety disorder 04/16/2013  . GERD (gastroesophageal reflux disease)   . Headache(784.0)    migraines  . Insomnia   . Nasal turbinate hypertrophy 02/2011   bilat.  . Neuromuscular disorder [facility])    Dr. Jerilynn Mages. Domingo Cocking- does injections around nerve in her head for prevention of migraines - q 3 months   . Pneumonia    hx of   . PONV (postoperative nausea and vomiting)   . Seasonal allergies     Past Surgical History:  Procedure Laterality Date  . ABDOMINAL HYSTERECTOMY    . CARPAL TUNNEL RELEASE Right   . DIAGNOSTIC LAPAROSCOPY    . EXCISION MASS LOWER EXTREMETIES Left 06/21/2018   Procedure: Excision of left upper leg fat necrosis;  Surgeon: Wallace Going, DO;  Location: [place];  Service: Plastics;  Laterality: Left;  . FOOT SURGERY Right    bone removed from great toe   . GASTRIC ROUX-EN-Y N/A 05/16/2016   Procedure: LAPAROSCOPIC ROUX-EN-Y GASTRIC BYPASS WITH UPPER ENDOSCOPY;  Surgeon: Clovis Riley, MD;  [facility];  Service: General;  Laterality: N/A;  . LAPAROSCOPIC VAGINAL HYSTERECTOMY  03/14/2007  . LIPOMA EXCISION Left 08/26/2016   Procedure: EXCISION LIPOMA FROM LEFT UPPER  POSTERIOR THIGH;  Surgeon: Clovis Riley, MD;  Location: [place];  Service: General;  Laterality: Left;  . MYRINGOTOMY WITH TUBE PLACEMENT Bilateral 03/02/2015   Procedure: MYRINGOTOMY WITH T-TUBE PLACEMENT;  Surgeon: Leta Baptist, MD;  Location: [place];  Service: ENT;  Laterality: Bilateral;  . NASAL SEPTOPLASTY W/ TURBINOPLASTY  02/22/2011   Procedure: NASAL SEPTOPLASTY WITH TURBINATE REDUCTION;  Surgeon: Ascencion Dike, MD;  Location: [place];  Service: ENT;  Laterality: Bilateral;  . TUBAL LIGATION  12/16/2004  . TYMPANOSTOMY TUBE PLACEMENT Bilateral 07/21/2017    There were no vitals filed for this visit.  Subjective Assessment - 09/20/18 1147    Subjective  "I am doing all right , just tired today"    Patient Stated Goals  lessen HA, UE strength    Currently in Pain?  No/denies         [facility] PT Assessment - 09/20/18 0001      AROM   Overall AROM Comments  cervical ROM WFL                   [facility] Adult PT Treatment/Exercise - 09/20/18 0001      Neck Exercises: Machines for Strengthening   Nustep  level 4 x 5 minutes    Cybex Row  25#  2x10    Lat Pull  25# 2x10    Other Machines for Strengthening  leg press 30# 2x10    Other Machines for Strengthening  leg curls 15# 2x10, leg extension 5# 2x10      Neck Exercises: Theraband   Shoulder Extension  20 reps;Red    Shoulder External Rotation  20 reps;Red    Other Theraband Exercises  triceps ext 20 lb 2x10       Moist Heat Therapy   Number Minutes Moist Heat  15 Minutes    Moist Heat Location  Cervical      Electrical Stimulation   Electrical Stimulation Location  bilateral c/t area    Electrical Stimulation Action  IFC    Electrical Stimulation Parameters  sitting ti npt tolerance    Electrical Stimulation Goals  Pain               PT Short Term Goals - 06/14/18 1131      PT SHORT TERM GOAL #1   Title  independent with initial HEP    Status  Achieved        PT  Long Term Goals - 09/20/18 1222      PT LONG TERM GOAL #5   Title  incresae cervical ROM 25%    Status  Achieved            Plan - 09/20/18 1217    Clinical Impression Statement  Pt did better tolerating exercise interventions today. Postural cues required with seated rows and lats. Some upper trap tightness reported with shoulder external rotation. LE fatigue was quick with seated curls and extensions, LLE is weaker than the right.    Stability/Clinical Decision Making  Stable/Uncomplicated    PT Frequency  2x / week    PT Duration  8 weeks    PT Treatment/Interventions  ADLs/Self Care Home Management;Cryotherapy;Electrical Stimulation;Moist Heat;Traction;Ultrasound;Patient/family education;Manual techniques;Dry needling    PT Next Visit Plan  assess the treatment, progress as tolerated       Patient will benefit from skilled therapeutic intervention in order to improve the following deficits and impairments:  Increased muscle spasms, Improper body mechanics, Decreased range of motion, Decreased strength, Postural dysfunction, Pain  Visit Diagnosis: Cramp and spasm  Cervicalgia  Chronic tension-type headache, intractable     Problem List Patient Active Problem List   Diagnosis Date Noted  . Hematoma of leg 03/02/2018  . Chronic migraine without aura, with intractable migraine, so stated, with status migrainosus 06/18/2017  . GAD (generalized anxiety disorder) 04/16/2013  . Bipolar 1 disorder, depressed ([place]) 04/16/2013    Scot Jun, PTA 09/20/2018, 12:22 PM  [place], [place], [address] Phone: [phone]   Fax:  [phone]  Name: Margaret Lopez MRN: [id number] Date of Birth: 1970-12-01

## 2018-09-22 IMAGING — US ULTRASOUND RIGHT BREAST LIMITED
1 series · 11 of 11 positions shown · non-contrast
Comparison: Previous exams including recent screening mammogram
dated 08/22/2016.

CLINICAL DATA: Patient returns today to evaluate possible right
breast masses identified on a recent screening mammogram.

EXAM:
2D DIGITAL DIAGNOSTIC RIGHT MAMMOGRAM WITH CAD AND ADJUNCT TOMO
ULTRASOUND RIGHT BREAST

[Series 1: ultrasound right breast limited · 0.06mm/px · 11 of 11 slices shown]
[im 1/11]
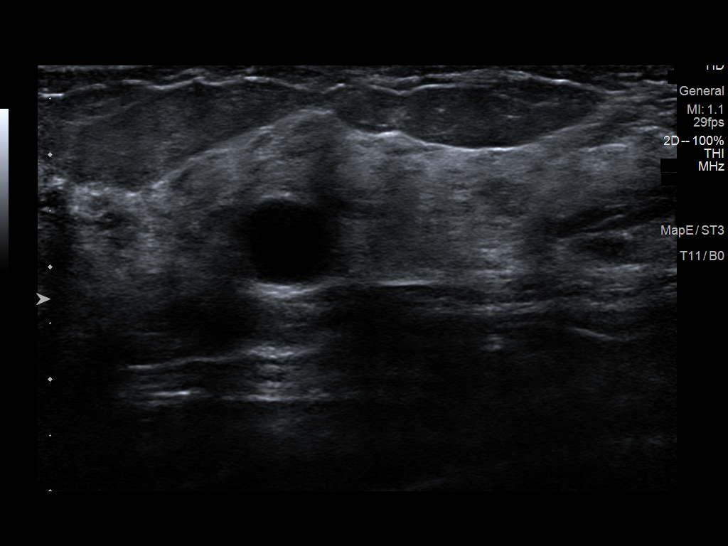
[im 2/11]
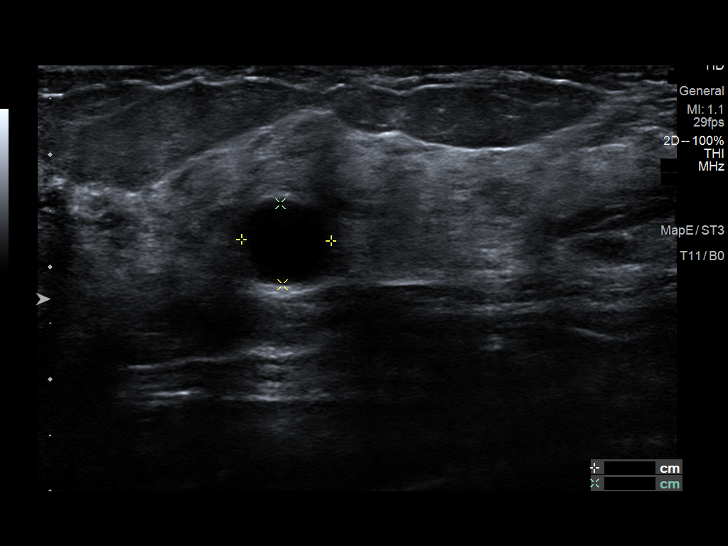
[im 3/11]
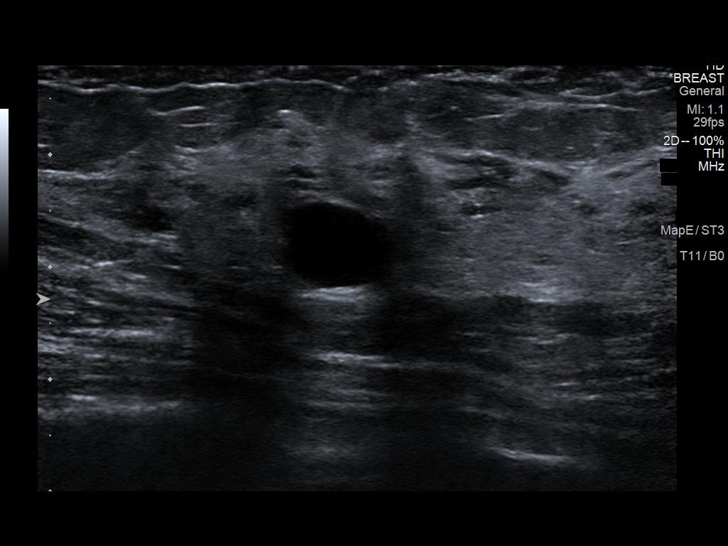
[im 4/11]
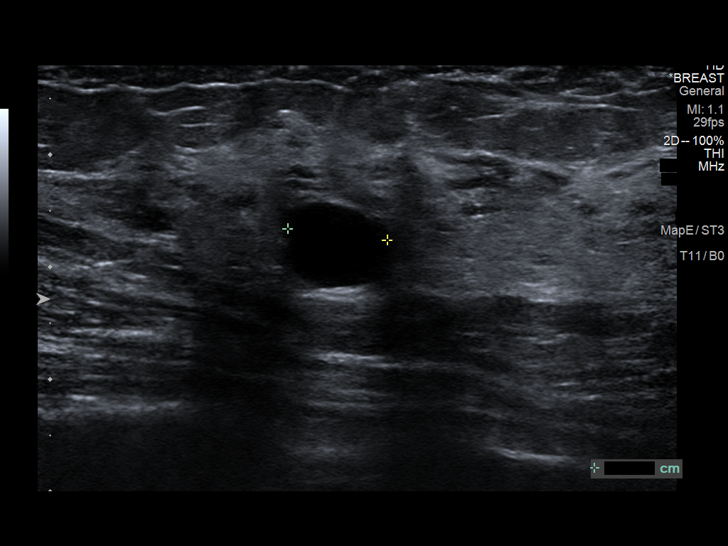
[im 5/11]
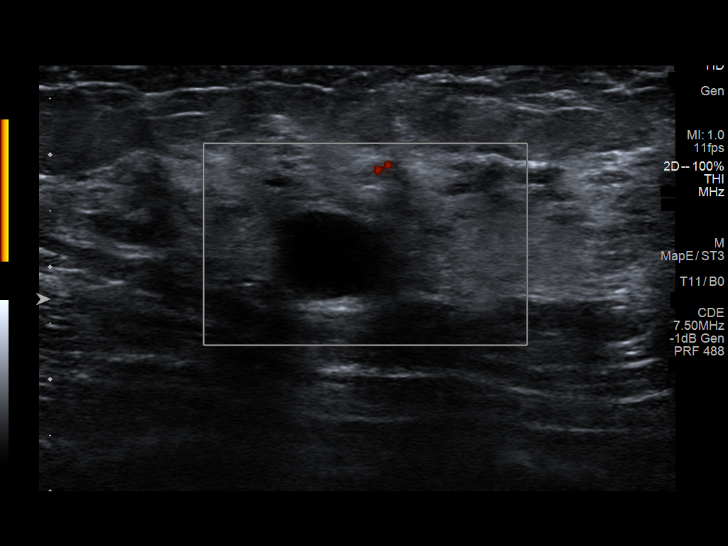
[im 6/11]
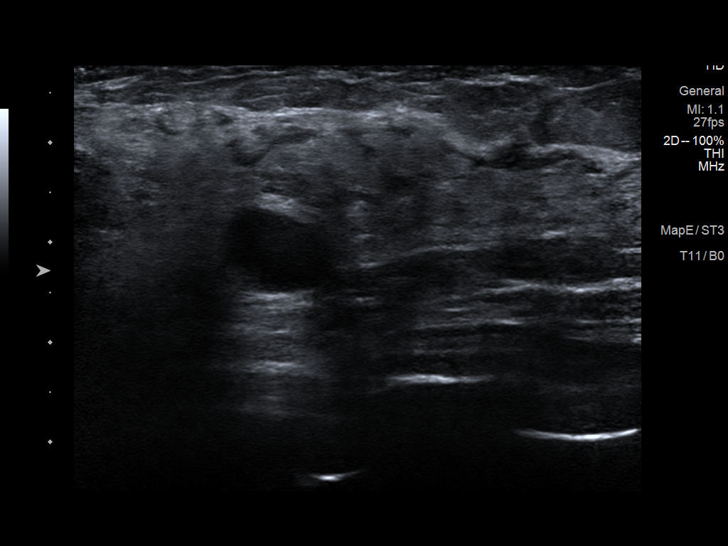
[im 7/11]
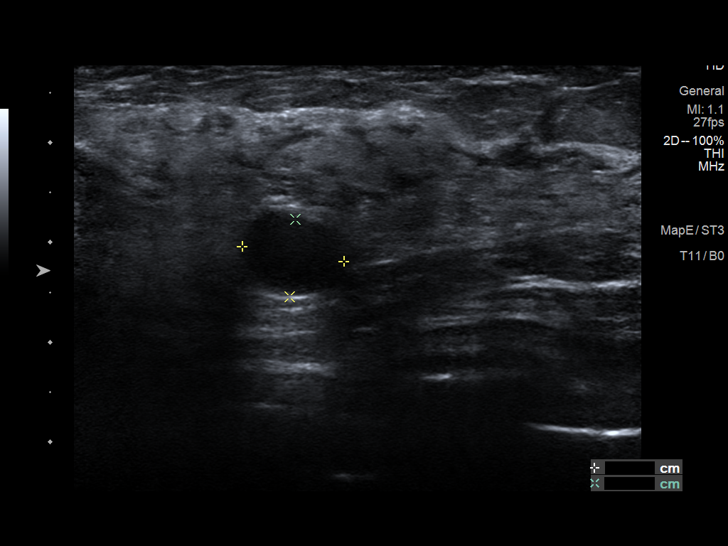
[im 8/11]
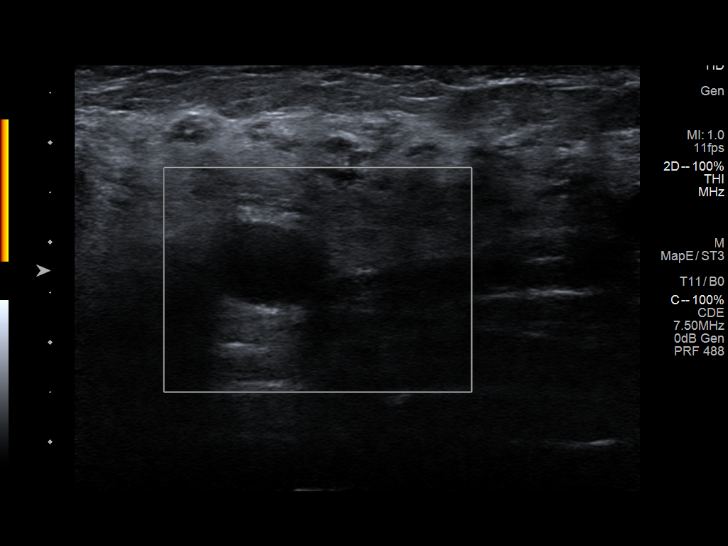
[im 9/11]
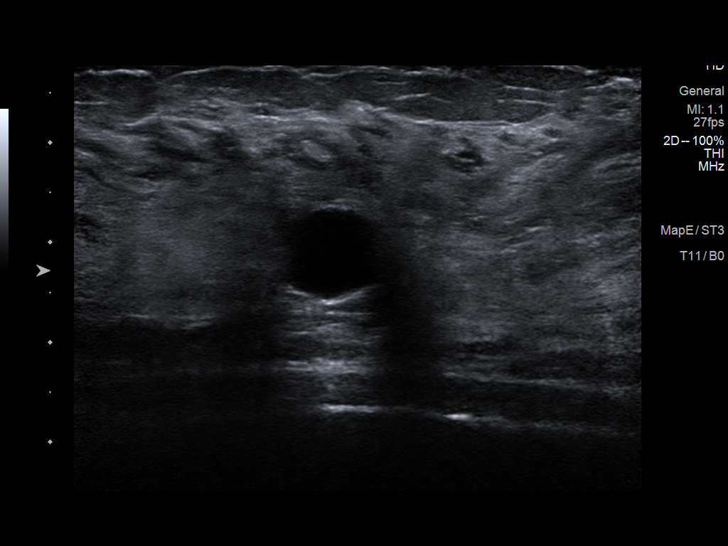
[im 10/11]
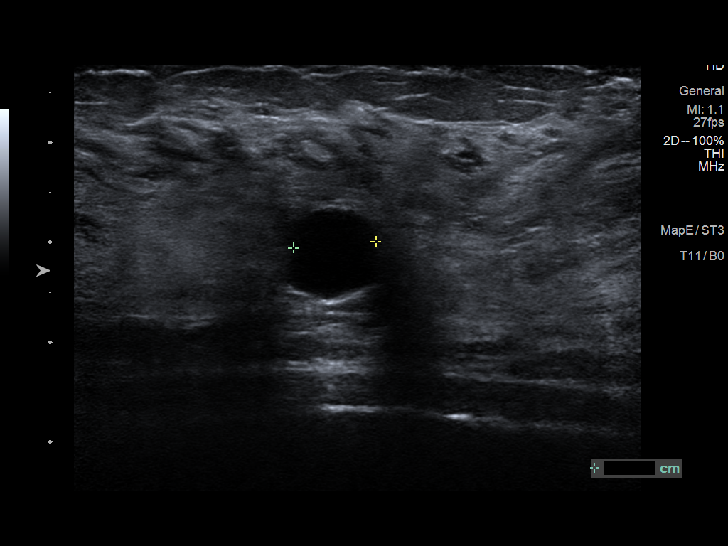
[im 11/11]
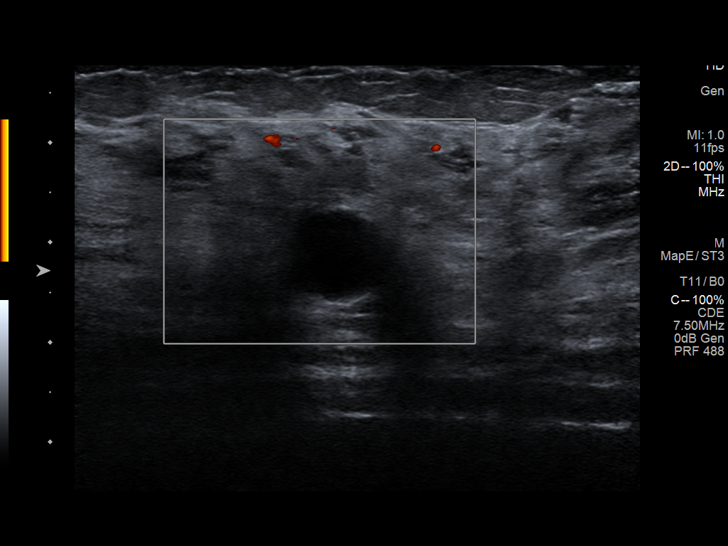

[11 of 11 positions shown; findings below may reference images not displayed]

ACR Breast Density Category c: The breast tissue is heterogeneously
dense, which may obscure small masses.
FINDINGS: On today's additional views of the right breast with spot
compression and 3D tomosynthesis, multiple oval circumscribed masses
are confirmed within the upper right breast, inner and outer
quadrants, largest measuring approximate 1 cm greatest dimension.

Mammographic images were processed with CAD.

Targeted ultrasound is performed, showing multiple benign cysts
within the upper right breast, largest located at the 11 o'clock
axis measuring 1 cm. No suspicious solid or cystic mass is
identified within the upper right breast.
IMPRESSION: No evidence of malignancy. Multiple benign cysts within the upper
right breast, corresponding to the mammographic findings.

Patient may return to routine annual bilateral screening mammogram
schedule.

RECOMMENDATION:
Screening mammogram in one year.(Code:[id number])

I have discussed the findings and recommendations with the patient.
Results were also provided in writing at the conclusion of the
visit. If applicable, a reminder letter will be sent to the patient
regarding the next appointment.

BI-RADS CATEGORY  2: Benign.

## 2018-09-24 ENCOUNTER — Ambulatory Visit: Payer: BC Managed Care – PPO | Admitting: Physical Therapy

## 2018-09-26 ENCOUNTER — Encounter: Payer: Self-pay | Admitting: *Deleted

## 2018-09-27 ENCOUNTER — Encounter: Payer: Self-pay | Admitting: Physical Therapy

## 2018-09-27 ENCOUNTER — Other Ambulatory Visit: Payer: Self-pay

## 2018-09-27 ENCOUNTER — Ambulatory Visit: Payer: BC Managed Care – PPO | Admitting: Physical Therapy

## 2018-09-27 DIAGNOSIS — G44221 Chronic tension-type headache, intractable: Secondary | ICD-10-CM

## 2018-09-27 DIAGNOSIS — M542 Cervicalgia: Secondary | ICD-10-CM

## 2018-09-27 DIAGNOSIS — R252 Cramp and spasm: Secondary | ICD-10-CM | POA: Diagnosis not present

## 2018-09-27 NOTE — Therapy (Signed)
[place], [place], [address] Phone: [phone]   Fax:  [phone]  Physical Therapy Treatment  Patient Details  Name: Margaret Lopez MRN: [id number] Date of Birth: 12-31-1970 Referring Provider (PT): Jaynee Eagles   Encounter Date: 09/27/2018  PT End of Session - 09/27/18 1341    Visit Number  21    Date for PT Re-Evaluation  10/11/18    PT Start Time  1300    PT Stop Time  1341    PT Time Calculation (min)  41 min    Activity Tolerance  Patient tolerated treatment well    Behavior During Therapy  [facility] for tasks assessed/performed       Past Medical History:  Diagnosis Date  . Anxiety   . Asthma   . Bipolar disorder ([place])   . Depression   . Deviated nasal septum 02/2011  . Generalized anxiety disorder 04/16/2013  . GERD (gastroesophageal reflux disease)   . Headache(784.0)    migraines  . Insomnia   . Nasal turbinate hypertrophy 02/2011   bilat.  . Neuromuscular disorder [facility])    Dr. Jerilynn Mages. Domingo Cocking- does injections around nerve in her head for prevention of migraines - q 3 months   . Pneumonia    hx of   . PONV (postoperative nausea and vomiting)   . Seasonal allergies     Past Surgical History:  Procedure Laterality Date  . ABDOMINAL HYSTERECTOMY    . CARPAL TUNNEL RELEASE Right   . DIAGNOSTIC LAPAROSCOPY    . EXCISION MASS LOWER EXTREMETIES Left 06/21/2018   Procedure: Excision of left upper leg fat necrosis;  Surgeon: Wallace Going, DO;  Location: [place];  Service: Plastics;  Laterality: Left;  . FOOT SURGERY Right    bone removed from great toe   . GASTRIC ROUX-EN-Y N/A 05/16/2016   Procedure: LAPAROSCOPIC ROUX-EN-Y GASTRIC BYPASS WITH UPPER ENDOSCOPY;  Surgeon: Clovis Riley, MD;  [facility];  Service: General;  Laterality: N/A;  . LAPAROSCOPIC VAGINAL HYSTERECTOMY  03/14/2007  . LIPOMA EXCISION Left 08/26/2016   Procedure: EXCISION LIPOMA FROM LEFT UPPER  POSTERIOR THIGH;  Surgeon: Clovis Riley, MD;  Location: [place];  Service: General;  Laterality: Left;  . MYRINGOTOMY WITH TUBE PLACEMENT Bilateral 03/02/2015   Procedure: MYRINGOTOMY WITH T-TUBE PLACEMENT;  Surgeon: Leta Baptist, MD;  Location: [place];  Service: ENT;  Laterality: Bilateral;  . NASAL SEPTOPLASTY W/ TURBINOPLASTY  02/22/2011   Procedure: NASAL SEPTOPLASTY WITH TURBINATE REDUCTION;  Surgeon: Ascencion Dike, MD;  Location: [place];  Service: ENT;  Laterality: Bilateral;  . TUBAL LIGATION  12/16/2004  . TYMPANOSTOMY TUBE PLACEMENT Bilateral 07/21/2017    There were no vitals filed for this visit.  Subjective Assessment - 09/27/18 1301    Subjective  "My head don't like storms" "I have a head ache today"    Patient Stated Goals  lessen HA, UE strength    Currently in Pain?  Yes    Pain Score  4     Pain Location  Head    Pain Orientation  Upper    Pain Descriptors / Indicators  Headache                       [facility] Adult PT Treatment/Exercise - 09/27/18 0001      Exercises   Exercises  Knee/Hip      Neck Exercises:  Machines for Strengthening   Nustep  level 4 x 6 minutes      Neck Exercises: Seated   Other Seated Exercise  2lb bent over ext 2x10    Other Seated Exercise  green Tband rows 2x15      Knee/Hip Exercises: Seated   Long Arc Quad  Both;2 sets;10 reps;Strengthening    Long Arc Quad Weight  3 lbs.    Other Seated Knee/Hip Exercises  hip flex 3lb x10 each     Hamstring Curl  Both;2 sets;15 reps;Strengthening    Hamstring Limitations  green tband       Ultrasound   Ultrasound Location  ipper R trap    Ultrasound Parameters  100% q.qw/cm2    Ultrasound Goals  Pain               PT Short Term Goals - 06/14/18 1131      PT SHORT TERM GOAL #1   Title  independent with initial HEP    Status  Achieved        PT Long Term Goals - 09/20/18 1222      PT LONG TERM GOAL #5   Title  incresae cervical  ROM 25%    Status  Achieved            Plan - 09/27/18 1342    Clinical Impression Statement  Pt suffers form chronic headaches and migraines. She reports having a headache pre treatment today so the intensities of the interventions were kept to a minium. Some L knee pain reported with LAQ, she also reported some weakness. L hamstring fatigue also noted with the curls. Positive response with today's treatment reporting a 1/10 pain afterwards.    Stability/Clinical Decision Making  Stable/Uncomplicated    Rehab Potential  Good    PT Frequency  2x / week    PT Duration  8 weeks    PT Treatment/Interventions  ADLs/Self Care Home Management;Cryotherapy;Electrical Stimulation;Moist Heat;Traction;Ultrasound;Patient/family education;Manual techniques;Dry needling    PT Next Visit Plan  assess the treatment, progress as tolerated       Patient will benefit from skilled therapeutic intervention in order to improve the following deficits and impairments:  Increased muscle spasms, Improper body mechanics, Decreased range of motion, Decreased strength, Postural dysfunction, Pain  Visit Diagnosis: Cramp and spasm  Cervicalgia  Chronic tension-type headache, intractable     Problem List Patient Active Problem List   Diagnosis Date Noted  . Hematoma of leg 03/02/2018  . Chronic migraine without aura, with intractable migraine, so stated, with status migrainosus 06/18/2017  . GAD (generalized anxiety disorder) 04/16/2013  . Bipolar 1 disorder, depressed ([place]) 04/16/2013    Scot Jun, PTA 09/27/2018, 1:45 PM  [place], [place], [address] Phone: [phone]   Fax:  [phone]  Name: Margaret Lopez MRN: [id number] Date of Birth: 01-30-1970

## 2018-10-04 ENCOUNTER — Ambulatory Visit: Payer: BC Managed Care – PPO | Admitting: Skilled Nursing Facility1

## 2018-10-08 ENCOUNTER — Encounter: Payer: Self-pay | Admitting: Physical Therapy

## 2018-10-08 ENCOUNTER — Other Ambulatory Visit: Payer: Self-pay

## 2018-10-08 ENCOUNTER — Ambulatory Visit: Payer: BC Managed Care – PPO | Admitting: Physical Therapy

## 2018-10-08 DIAGNOSIS — G44221 Chronic tension-type headache, intractable: Secondary | ICD-10-CM

## 2018-10-08 DIAGNOSIS — R252 Cramp and spasm: Secondary | ICD-10-CM

## 2018-10-08 DIAGNOSIS — M542 Cervicalgia: Secondary | ICD-10-CM

## 2018-10-08 NOTE — Therapy (Signed)
[place], [place], [address] Phone: [phone]   Fax:  [phone]  Physical Therapy Treatment  Patient Details  Name: Margaret Lopez MRN: [id number] Date of Birth: 16-Jan-1970 Referring Provider (PT): Jaynee Eagles   Encounter Date: 10/08/2018  PT End of Session - 10/08/18 1640    Visit Number  22    Number of Visits  30    Date for PT Re-Evaluation  10/11/18    PT Start Time  1600    PT Stop Time  1640    PT Time Calculation (min)  40 min    Activity Tolerance  Patient tolerated treatment well    Behavior During Therapy  [facility] for tasks assessed/performed       Past Medical History:  Diagnosis Date  . Anxiety   . Asthma   . Bipolar disorder ([place])   . Depression   . Deviated nasal septum 02/2011  . Generalized anxiety disorder 04/16/2013  . GERD (gastroesophageal reflux disease)   . Headache(784.0)    migraines  . Insomnia   . Nasal turbinate hypertrophy 02/2011   bilat.  . Neuromuscular disorder [facility])    Dr. Jerilynn Mages. Domingo Cocking- does injections around nerve in her head for prevention of migraines - q 3 months   . Pneumonia    hx of   . PONV (postoperative nausea and vomiting)   . Seasonal allergies     Past Surgical History:  Procedure Laterality Date  . ABDOMINAL HYSTERECTOMY    . CARPAL TUNNEL RELEASE Right   . DIAGNOSTIC LAPAROSCOPY    . EXCISION MASS LOWER EXTREMETIES Left 06/21/2018   Procedure: Excision of left upper leg fat necrosis;  Surgeon: Wallace Going, DO;  Location: [place];  Service: Plastics;  Laterality: Left;  . FOOT SURGERY Right    bone removed from great toe   . GASTRIC ROUX-EN-Y N/A 05/16/2016   Procedure: LAPAROSCOPIC ROUX-EN-Y GASTRIC BYPASS WITH UPPER ENDOSCOPY;  Surgeon: Clovis Riley, MD;  Location: [facility];  Service: General;  Laterality: N/A;  . LAPAROSCOPIC VAGINAL HYSTERECTOMY  03/14/2007  . LIPOMA EXCISION Left 08/26/2016   Procedure:  EXCISION LIPOMA FROM LEFT UPPER POSTERIOR THIGH;  Surgeon: Clovis Riley, MD;  Location: [place];  Service: General;  Laterality: Left;  . MYRINGOTOMY WITH TUBE PLACEMENT Bilateral 03/02/2015   Procedure: MYRINGOTOMY WITH T-TUBE PLACEMENT;  Surgeon: Leta Baptist, MD;  Location: [place];  Service: ENT;  Laterality: Bilateral;  . NASAL SEPTOPLASTY W/ TURBINOPLASTY  02/22/2011   Procedure: NASAL SEPTOPLASTY WITH TURBINATE REDUCTION;  Surgeon: Ascencion Dike, MD;  Location: [place];  Service: ENT;  Laterality: Bilateral;  . TUBAL LIGATION  12/16/2004  . TYMPANOSTOMY TUBE PLACEMENT Bilateral 07/21/2017    There were no vitals filed for this visit.  Subjective Assessment - 10/08/18 1603    Subjective  Pt reports that her head is not bad but she can tell a storm is coming.    Patient Stated Goals  lessen HA, UE strength    Currently in Pain?  Yes    Pain Score  2     Pain Location  Head    Pain Orientation  Left    Pain Descriptors / Indicators  Headache                       [facility] Adult PT Treatment/Exercise - 10/08/18 0001  Neck Exercises: Machines for Strengthening   Nustep  level 4 x 6 minutes    Cybex Row  25# 2x10    Lat Pull  25# 2x10    Other Machines for Strengthening  leg press 30# 2x10    Other Machines for Strengthening  leg curls 20# 2x10, leg extension 5# 2x10      Neck Exercises: Theraband   Other Theraband Exercises  triceps ext 20 lb 2x10       Neck Exercises: Standing   Other Standing Exercises  Shoulder Ext 5lb 2x10      Knee/Hip Exercises: Seated   Long Arc Quad  10 reps;Strengthening;3 sets;Left    Long Arc Quad Weight  2 lbs.               PT Short Term Goals - 06/14/18 1131      PT SHORT TERM GOAL #1   Title  independent with initial HEP    Status  Achieved        PT Long Term Goals - 10/08/18 1618      PT LONG TERM GOAL #2   Title  report HA frequency decreased 50%    Status  Partially Met       PT LONG TERM GOAL #3   Title  report HA intensity decreased 25%    Status  Partially Met      PT LONG TERM GOAL #4   Title  increase UE strength to 4/5    Status  Partially Met      PT LONG TERM GOAL #5   Title  incresae cervical ROM 25%    Status  Achieved            Plan - 10/08/18 1645    Clinical Impression Statement  Pt enters clinic report little head pain. She was able to complete all of today's interventions well evident by no subjective reports of increase pain. LLE weakness noted with seated leg extensions. Cues to LLE in contact with leg extensions pad. Moved to LAQ to better isolate L quad. L knee pain reported with some of the reps during LAQ intervention. Postural cues needed with standing shoulder extension.    Stability/Clinical Decision Making  Stable/Uncomplicated    Rehab Potential  Good    PT Duration  8 weeks    PT Treatment/Interventions  ADLs/Self Care Home Management;Cryotherapy;Electrical Stimulation;Moist Heat;Traction;Ultrasound;Patient/family education;Manual techniques;Dry needling    PT Next Visit Plan  assess the treatment, progress as tolerated, TENs education       Patient will benefit from skilled therapeutic intervention in order to improve the following deficits and impairments:  Increased muscle spasms, Improper body mechanics, Decreased range of motion, Decreased strength, Postural dysfunction, Pain  Visit Diagnosis: Cramp and spasm  Cervicalgia  Chronic tension-type headache, intractable     Problem List Patient Active Problem List   Diagnosis Date Noted  . Hematoma of leg 03/02/2018  . Chronic migraine without aura, with intractable migraine, so stated, with status migrainosus 06/18/2017  . GAD (generalized anxiety disorder) 04/16/2013  . Bipolar 1 disorder, depressed ([place]) 04/16/2013    Scot Jun 10/08/2018, 4:48 PM  [facility]  [place], [place], [address] Phone: [phone]   Fax:  [phone]  Name: Margaret Lopez MRN: [id number] Date of Birth: Oct 23, 1970

## 2018-10-08 NOTE — Patient Instructions (Signed)
Access Code: [email or web address]  URL: [email or web address]  Date: 10/08/2018  Prepared by: Cheri Fowler   Exercises  Standing Row with Resistance - 10 reps - 3 sets - 1x daily - 7x weekly  Shoulder extension with resistance - Neutral - 10 reps - 3 sets - 1x daily - 7x weekly  Sit to Stand without Arm Support - 10 reps - 3 sets - 1x daily - 7x weekly  Supine Chin Tuck - 10 reps - 3 sets - 1x daily - 7x weekly

## 2018-10-18 DIAGNOSIS — Z23 Encounter for immunization: Secondary | ICD-10-CM | POA: Diagnosis not present

## 2018-10-19 DIAGNOSIS — R197 Diarrhea, unspecified: Secondary | ICD-10-CM | POA: Diagnosis not present

## 2018-10-19 DIAGNOSIS — Z9884 Bariatric surgery status: Secondary | ICD-10-CM | POA: Diagnosis not present

## 2018-10-19 DIAGNOSIS — R1030 Lower abdominal pain, unspecified: Secondary | ICD-10-CM | POA: Diagnosis not present

## 2018-10-25 DIAGNOSIS — Z1272 Encounter for screening for malignant neoplasm of vagina: Secondary | ICD-10-CM | POA: Diagnosis not present

## 2018-10-25 DIAGNOSIS — Z01419 Encounter for gynecological examination (general) (routine) without abnormal findings: Secondary | ICD-10-CM | POA: Diagnosis not present

## 2018-10-25 DIAGNOSIS — Z6824 Body mass index (BMI) 24.0-24.9, adult: Secondary | ICD-10-CM | POA: Diagnosis not present

## 2018-10-25 DIAGNOSIS — Z1231 Encounter for screening mammogram for malignant neoplasm of breast: Secondary | ICD-10-CM | POA: Diagnosis not present

## 2018-11-07 ENCOUNTER — Encounter: Payer: BC Managed Care – PPO | Attending: Surgery | Admitting: Skilled Nursing Facility1

## 2018-11-07 ENCOUNTER — Encounter: Payer: Self-pay | Admitting: *Deleted

## 2018-11-07 ENCOUNTER — Other Ambulatory Visit: Payer: Self-pay

## 2018-11-07 DIAGNOSIS — E669 Obesity, unspecified: Secondary | ICD-10-CM | POA: Diagnosis not present

## 2018-11-07 NOTE — Progress Notes (Signed)
Post-Operative RYGB Surgery  Medical Nutrition Therapy:  Appt start time: 3:20 end time:  4:11.  Primary concerns today: Post-operative Bariatric Surgery Nutrition Management.  Non scale victories: more active  Pt arrives struggling to preform self care due to home life stress with the result of skipping meals, not meeting her fluid needs, and constipation.   Pt states she is still under a tremendous amount of stress.  Pt states she went to her surgeon because she was having liquid stools for a few days 2 different times about 1 week apart.   Pt states she does her best to tell people no and not take on too much.  Pt states she tries to eat every 3-4 hours so she does not over eat and tries to pack her lunch for work.   Pt states she has indigestion/sour stomach every day which taking the tums helps with.   Pt states she has been working on trying to get in bed earlier.   Surgery date: 05/16/2016 Surgery type: RYGB [facility] weight at [facility]: 227.3 lbs 02/05/2016 Weight today: 143.6 Weight loss goal: less than 150 lbs, learn to eat healthier foods, be more active/present with family  Body Composition Scale 07/10/2018 11/07/2018  Total Body Fat % 27.3 28.6  Visceral Fat 6 6  Fat-Free Mass % 72.6 71.3   Total Body Water % 50.8 50.1   Muscle-Mass lbs 29.9 30  Body Fat Displacement           Torso  lbs 23.4 25.3         Left Leg  lbs 4.6 5         Right Leg  lbs 4.6 5         Left Arm  lbs 2.3 2.5         Right Arm   lbs 2.3 2.5   24-hr recall: eating every 3-4 hours B (AM): protein shake  Snk (11AM): banana sandwich or just the banana  Decaf coffee or propel L (PM): chicken thigh with cream cheese shredded cheese and bacon with green onion and mac n cheese or ham sandwich or ground hamburger with scaloped potatoes  Snk (PM): sometimes yogurt or uncrustable  4pm (early dinner): pimento cheese and bagel crisps and cucmbers or hamburgers and mac n cheese and salad or small salad and  baked ziti D (PM): ground hamburger with ketchup on hotdog bun with cheese and lettuce and tater tots  Snk (PM): popcorn   Fluid intake: 2 decaff coffee, 1 bottle of propel, sugar free koolaid in water, and caffinated coffee: not tracking: 32-40 ounces Estimated total protein intake: 60g  Medications: See list Supplementation: opurity: inconsistant once a day and tums 3 times a day  Using straws: no Drinking while eating: no Having you been chewing well: yes Chewing/swallowing difficulties: no Changes in vision: no Changes to mood/headaches: no Hair loss/Changes to skin/Changes to nails: no Any difficulty focusing or concentrating: no Sweating: no Dizziness/Lightheaded: no Palpitations: no  Carbonated beverages: no N/V/D/C/GAS: constipation  Abdominal Pain: no Dumping syndrome: no  Recent physical activity:  Walking 3 days a week 20-30 minutes   Progress Towards Goal(s):  In progress.     Intervention:  Nutrition education and counseling. Goals: -Continue to prepare your meals for lunch -Start your day with 4-8 ounces every morning of kefir for constipation    Teaching Method Utilized:  Visual Auditory Hands on  Barriers to learning/adherence to lifestyle change: none  Demonstrated degree of understanding via:  Teach Back   Monitoring/Evaluation:  Dietary intake, exercise, and body weight. Return in 3 months.

## 2018-11-07 NOTE — Telephone Encounter (Signed)
Completed Nurtec continuation PA on CMM. Key: [id number]. Awaiting [employer] determination.

## 2018-11-08 ENCOUNTER — Ambulatory Visit (INDEPENDENT_AMBULATORY_CARE_PROVIDER_SITE_OTHER): Payer: BC Managed Care – PPO | Admitting: Psychiatry

## 2018-11-08 ENCOUNTER — Other Ambulatory Visit: Payer: Self-pay

## 2018-11-08 ENCOUNTER — Encounter (HOSPITAL_COMMUNITY): Payer: Self-pay | Admitting: Psychiatry

## 2018-11-08 DIAGNOSIS — F411 Generalized anxiety disorder: Secondary | ICD-10-CM | POA: Diagnosis not present

## 2018-11-08 DIAGNOSIS — F319 Bipolar disorder, unspecified: Secondary | ICD-10-CM

## 2018-11-08 MED ORDER — LITHIUM CARBONATE ER 300 MG PO TBCR: 600.0000 mg | EXTENDED_RELEASE_TABLET | Freq: Every evening | ORAL | 0 refills | Status: DC

## 2018-11-08 MED ORDER — DIVALPROEX SODIUM ER 500 MG PO TB24: 2000.0000 mg | ORAL_TABLET | Freq: Every day | ORAL | 0 refills | Status: DC

## 2018-11-08 MED ORDER — ALPRAZOLAM 0.5 MG PO TABS: 0.5000 mg | ORAL_TABLET | Freq: Two times a day (BID) | ORAL | 0 refills | Status: DC | PRN

## 2018-11-08 NOTE — Progress Notes (Signed)
  Virtual Visit via Telephone Note  I connected with Margaret Lopez  on 11/08/18 at 11:30 AM EDT by telephone and verified that I am speaking with the correct person using two identifiers.  Location: Patient: home Provider: office   I discussed the limitations, risks, security and privacy concerns of performing an evaluation and management service by telephone and the availability of in person appointments. I also discussed with the patient that there may be a patient responsible charge related to this service. The patient expressed understanding and agreed to proceed.   History of Present Illness: "I have more good days than bad now". She has some irritability and is working on it. She is working hard on taking all 4 tabs of Depakote and that helps. She takes 1-2 tabs before dinner and the rest after dinner. She denies any manic or hypomanic like symptoms. Some of it has to do with COVID and restrictions. She has done a lot of shopping and stalking up on things as a way to make sure her family is well provided for. Her anxiety is mostly centered on the changes COVID has brought and her son's MH. She feels chest tightness and racing thoughts. She has not been taking Xanax because it expired. It was last filled in June of 2019. At night she has occasionally taken Vistaril. Most nights she is sleeping ok. She denies any concerning depression symptoms. Margaret Lopez feels the depression is manageable.  She denies SI/HI.     Observations/Objective: I spoke with Margaret Lopez on the phone.  Pt was calm, pleasant and cooperative.  Pt was engaged in the conversation and answered questions appropriately.  Speech was clear and coherent with normal rate, tone and volume.  Mood is anxious, affect is congruent. Thought processes are coherent, goal oriented and intact.  Thought content is logical.  Pt denies SI/HI.   Pt denies auditory and visual hallucinations and did not appear to be responding to internal stimuli.   Memory and concentration are good.  Fund of knowledge and use of language are average.  Insight and judgment are fair.  I am unable to comment on psychomotor activity, general appearance, hygiene, or eye contact as I was unable to physically see the patient on the phone.  Vital signs not available since interview conducted virtually.    I reviewed the information below on 11/08/2018 and have updated it Assessment and Plan: GAD; Bipolar I d/o  Status of current symptoms: stable   Lithium CR 600mg  po qPM   Xanax 0.5mg  po BID prn anxiety   Depakote ER 2000mg  po qHS She has not been able tolerate liquid Depakote or sprinkles  I will order labs at next visit   Follow Up Instructions: In 12 weeks or sooner if needed   I discussed the assessment and treatment plan with the patient. The patient was provided an opportunity to ask questions and all were answered. The patient agreed with the plan and demonstrated an understanding of the instructions.   The patient was advised to call back or seek an in-person evaluation if the symptoms worsen or if the condition fails to improve as anticipated.  I provided 20 minutes of non-face-to-face time during this encounter.   Charlcie Cradle, MD

## 2018-11-12 DIAGNOSIS — R002 Palpitations: Secondary | ICD-10-CM | POA: Diagnosis not present

## 2018-11-12 NOTE — Telephone Encounter (Signed)
Nurtec PA determination received from [facility] Approved 11/07/2018 through 11/06/2019. I faxed approval to pt's pharmacy. Received a receipt of confirmation.

## 2018-11-22 ENCOUNTER — Ambulatory Visit (INDEPENDENT_AMBULATORY_CARE_PROVIDER_SITE_OTHER): Payer: BC Managed Care – PPO | Admitting: Licensed Clinical Social Worker

## 2018-11-22 ENCOUNTER — Other Ambulatory Visit: Payer: Self-pay

## 2018-11-22 DIAGNOSIS — F411 Generalized anxiety disorder: Secondary | ICD-10-CM
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
## 2018-11-29 ENCOUNTER — Ambulatory Visit (INDEPENDENT_AMBULATORY_CARE_PROVIDER_SITE_OTHER): Payer: BC Managed Care – PPO | Admitting: Neurology

## 2018-11-29 ENCOUNTER — Encounter (HOSPITAL_COMMUNITY): Payer: Self-pay | Admitting: Licensed Clinical Social Worker

## 2018-11-29 ENCOUNTER — Ambulatory Visit (INDEPENDENT_AMBULATORY_CARE_PROVIDER_SITE_OTHER): Payer: BC Managed Care – PPO | Admitting: Licensed Clinical Social Worker

## 2018-11-29 ENCOUNTER — Other Ambulatory Visit: Payer: Self-pay

## 2018-11-29 VITALS — Temp 98.0°F

## 2018-11-29 DIAGNOSIS — F411 Generalized anxiety disorder: Secondary | ICD-10-CM | POA: Diagnosis not present

## 2018-11-29 DIAGNOSIS — G43711 Chronic migraine without aura, intractable, with status migrainosus: Secondary | ICD-10-CM

## 2018-11-29 NOTE — Progress Notes (Signed)
Consent Form Botulism Toxin Injection For Chronic Migraine  Interval history 11/30/2018: Baseline 15 headache days a month and 10 migraine days a month. Excellent response, she has > 75% reduction in migraines and headaches monthly. Discussed acute management prescribed frova, zembrace works quickly but the migraines rebound. Did great on Nurtec and we prescribed and she continues to take. Patient felt her forehead and eyebrows were uncomfortable due to inability to move them, reduced injections 1/2 injections in the corrigators and procerus, did the frontal very high and was much better. +10 each masseter, +5 each lateral pterygoid, +5 bilat levator scapulae. +temples.  Dry needling gave her a very bad migraine will not order that again.    Reviewed orally with patient, additionally signature is on file:  Botulism toxin has been approved by the Federal drug administration for treatment of chronic migraine. Botulism toxin does not cure chronic migraine and it may not be effective in some patients.  The administration of botulism toxin is accomplished by injecting a small amount of toxin into the muscles of the neck and head. Dosage must be titrated for each individual. Any benefits resulting from botulism toxin tend to wear off after 3 months with a repeat injection required if benefit is to be maintained. Injections are usually done every 3-4 months with maximum effect peak achieved by about 2 or 3 weeks. Botulism toxin is expensive and you should be sure of what costs you will incur resulting from the injection.  The side effects of botulism toxin use for chronic migraine may include:   -Transient, and usually mild, facial weakness with facial injections  -Transient, and usually mild, head or neck weakness with head/neck injections  -Reduction or loss of forehead facial animation due to forehead muscle weakness  -Eyelid drooping  -Dry eye  -Pain at the site of injection or bruising at the  site of injection  -Double vision  -Potential unknown long term risks  Contraindications: You should not have Botox if you are pregnant, nursing, allergic to albumin, have an infection, skin condition, or muscle weakness at the site of the injection, or have myasthenia gravis, Lambert-Eaton syndrome, or ALS.  It is also possible that as with any injection, there may be an allergic reaction or no effect from the medication. Reduced effectiveness after repeated injections is sometimes seen and rarely infection at the injection site may occur. All care will be taken to prevent these side effects. If therapy is given over a long time, atrophy and wasting in the muscle injected may occur. Occasionally the patient's become refractory to treatment because they develop antibodies to the toxin. In this event, therapy needs to be modified.  I have read the above information and consent to the administration of botulism toxin.    BOTOX PROCEDURE NOTE FOR MIGRAINE HEADACHE    Contraindications and precautions discussed with patient(above). Aseptic procedure was observed and patient tolerated procedure. Procedure performed by Dr. Georgia Dom  The condition has existed for more than 6 months, and pt does not have a diagnosis of ALS, Myasthenia Gravis or Lambert-Eaton Syndrome.  Risks and benefits of injections discussed and pt agrees to proceed with the procedure.  Written consent obtained  These injections are medically necessary. Pt  receives good benefits from these injections. These injections do not cause sedations or hallucinations which the oral therapies may cause.  Indication/Diagnosis: chronic migraine BOTOX(J0585) injection was performed according to protocol by [employer]. 200 units of BOTOX was dissolved into 4 cc NS.  NDC: [phone]   Description of procedure:  The patient was placed in a sitting position. The standard protocol was used for Botox as follows, with 5 units of Botox  injected at each site:   -Procerus muscle, midline injection 2.5 units   -Corrugator muscle, bilateral injection 2.5 units each  -Frontalis muscle, bilateral injection, with 2 sites each side, medial injection was performed in the upper one third of the frontalis muscle, in the region vertical from the medial inferior edge of the superior orbital rim. The lateral injection was again in the upper one third of the forehead vertically above the lateral limbus of the cornea, 1.5 cm lateral to the medial injection site.  - Levator Scapulae: 5 units bilaterally   -Temporalis muscle injection, 5 sites, bilaterally. The first injection was 3 cm above the tragus of the ear, second injection site was 1.5 cm to 3 cm up from the first injection site in line with the tragus of the ear. The third injection site was 1.5-3 cm forward between the first 2 injection sites. The fourth injection site was 1.5 cm posterior to the second injection site. 5th site laterally in the temporalis at the level of the outer canthus.  - Patient feels her clenching is a trigger for headaches. +10 units masseter bilaterally and +5 units bilater lateral pterygoids.  -Occipitalis muscle injection, 3 sites, bilaterally. The first injection was done one half way between the occipital protuberance and the tip of the mastoid process behind the ear. The second injection site was done lateral and superior to the first, 1 fingerbreadth from the first injection. The third injection site was 1 fingerbreadth superiorly and medially from the first injection site.  -Cervical paraspinal muscle injection, 2 sites, bilateral knee first injection site was 1 cm from the midline of the cervical spine, 3 cm inferior to the lower border of the occipital protuberance. The second injection site was 1.5 cm superiorly and laterally to the first injection site.  -Trapezius muscle injection was performed at 3 sites, bilaterally. The first injection site was  in the upper trapezius muscle halfway between the inflection point of the neck, and the acromion. The second injection site was one half way between the acromion and the first injection site. The third injection was done between the first injection site and the inflection point of the neck.   Will return for repeat injection in 3 months.   198 unit sof Botox was used, 198 units were injected, the rest of the Botox was wasted. The patient tolerated the procedure well, there were no complications of the above procedure.

## 2018-11-29 NOTE — Progress Notes (Signed)
Virtual Visit via Video Note  I connected with Margaret Lopez on 11/29/18 at 10:00 AM EST by a video enabled telemedicine application and verified that I am speaking with the correct person using two identifiers.  Location: Patient: Home Provider: Office   I discussed the limitations of evaluation and management by telemedicine and the availability of in person appointments. The patient expressed understanding and agreed to proceed.      I discussed the assessment and treatment plan with the patient. The patient was provided an opportunity to ask questions and all were answered. The patient agreed with the plan and demonstrated an understanding of the instructions.   The patient was advised to call back or seek an in-person evaluation if the symptoms worsen or if the condition fails to improve as anticipated.  I provided 45 minutes of non-face-to-face time during this encounter.   Archie Balboa, LCAS-A    THERAPIST PROGRESS NOTE  Session Time: 10-10:45  Participation Level: Active  Behavioral Response: Well GroomedAlertAnxious  Type of Therapy: Individual Therapy  Treatment Goals addressed: Anxiety  Interventions: Motivational Interviewing and Meditation: Mindfulness of breath exercise  Summary: Margaret Lopez is a 48 y.o. female who presents with hx of anxiety and traumatic exposure as teenager. She reports she is feeling more hopeful than last session and is working on her communication w/ her sons. I inquire about Pt's current self care and communication which both needs improvement. I recognize that PT has blunted affect and is on the verge of tears when she talks about getting "all her to do items done today", her day off. I ask PT to do a short 1 min awareness of breath exercise. She complies and notices a "weight" on her chest and is surprised to feel some anxiety during the exercise. I instruct PT on importance of mindfulness as a foundational coping mechanism for managing  anxiety. We talk about PT's goal this week of finding a ritual she can do for 5 mins or less each day to reconnect w/ herself and her body.    Suicidal/Homicidal: Nowithout intent/plan  Therapist Response: I used open questions, active listening, and psychoeducation on mindfulness and instruction for practicing mindfulness in session. I emphasize importance of learning the art or "noticing" (mindfulness) so she can better respond to her needs moment to moment. I emphasize need to learn to live in the moment rather than worried about future or past. PT is agreeable to all these and appears motivated to initiate these techniques.   Plan: Return again in 2 weeks.  Diagnosis:    ICD-10-CM   1. Generalized anxiety disorder  F41.[place] Swan, LCAS-A 11/29/2018

## 2018-11-29 NOTE — Progress Notes (Signed)
Botox- 200 units x 1 vial Lot: [id number] Expiration: 08/2021 NDC: 0023-3921-02  Bacteriostatic 0.9% Sodium Chloride- 4mL total Lot: [id number] Expiration: 04/11/2019 NDC: 0409-1966-02  Dx: G43.711 B/B   

## 2018-12-03 DIAGNOSIS — R002 Palpitations: Secondary | ICD-10-CM | POA: Diagnosis not present

## 2018-12-04 ENCOUNTER — Ambulatory Visit: Payer: BC Managed Care – PPO | Admitting: Neurology

## 2018-12-05 DIAGNOSIS — I471 Supraventricular tachycardia: Secondary | ICD-10-CM | POA: Diagnosis not present

## 2018-12-05 DIAGNOSIS — R002 Palpitations: Secondary | ICD-10-CM | POA: Diagnosis not present

## 2018-12-12 DIAGNOSIS — H903 Sensorineural hearing loss, bilateral: Secondary | ICD-10-CM | POA: Diagnosis not present

## 2018-12-12 DIAGNOSIS — H7203 Central perforation of tympanic membrane, bilateral: Secondary | ICD-10-CM | POA: Diagnosis not present

## 2018-12-12 DIAGNOSIS — H6983 Other specified disorders of Eustachian tube, bilateral: Secondary | ICD-10-CM | POA: Diagnosis not present

## 2018-12-18 ENCOUNTER — Encounter (HOSPITAL_COMMUNITY): Payer: Self-pay | Admitting: Licensed Clinical Social Worker

## 2018-12-18 ENCOUNTER — Ambulatory Visit (INDEPENDENT_AMBULATORY_CARE_PROVIDER_SITE_OTHER): Payer: BC Managed Care – PPO | Admitting: Licensed Clinical Social Worker

## 2018-12-18 DIAGNOSIS — F411 Generalized anxiety disorder: Secondary | ICD-10-CM | POA: Diagnosis not present

## 2018-12-18 DIAGNOSIS — F319 Bipolar disorder, unspecified: Secondary | ICD-10-CM

## 2018-12-18 NOTE — Progress Notes (Signed)
Virtual Visit via Video Note  I connected with DRUSCILLA OROSCO on 12/18/18 at  3:30 PM EST by a video enabled telemedicine application and verified that I am speaking with the correct person using two identifiers.  Location: Patient: Home Provider: Office   I discussed the limitations of evaluation and management by telemedicine and the availability of in person appointments. The patient expressed understanding and agreed to proceed.     I discussed the assessment and treatment plan with the patient. The patient was provided an opportunity to ask questions and all were answered. The patient agreed with the plan and demonstrated an understanding of the instructions.   The patient was advised to call back or seek an in-person evaluation if the symptoms worsen or if the condition fails to improve as anticipated.  I provided 55 minutes of non-face-to-face time during this encounter.   Archie Balboa, LCAS-A    THERAPIST PROGRESS NOTE  Session Time: [phone]  Participation Level: Active  Behavioral Response: Well GroomedAlertAnxious  Type of Therapy: Individual Therapy  Treatment Goals addressed: Anxiety  Interventions: CBT and Psychosocial Skills: Self Care, Self compassion  Summary: Margaret Lopez is a 48 y.o. female who presents with hx of GAD and Dx Bipolar 1 . She is active, engaged in our virtual session. She expressed worry and stress about her son's upcoming consequences for failing their classes this year. I work to help PT identify her own internal self talk, inner critical parent towards herself, and her judgmental voice. She admits that part of her feels like a "failure" if her children fail their assignments. We discuss the irrationality of this belief and how it likely stems from childhood of being told her value comes from her accomplishments. I help PT learn new ways of relating to herself and how she can talk positively to herself. I recommend a TED talk on Self Compassion  by Marilynn Latino.   Suicidal/Homicidal: Nowithout intent/plan  Therapist Response: I used open questions, active listening, and therapeutic reflection of emotion and thoughts. I helped PT identify negative core beliefs and their underlying premise and cause. I helped PT challenge her negative self talk and instructed her on how to spend 5 mins talking positively to herself at night. PT was agreeable to this. She continues to be primarily concerned w/ "how to help her boys" rather than herself, despite my constant reframing that she will need to work on herself first in order to improve her relationships w/ her sons.   Plan: Return again in 2 weeks.  Diagnosis:    ICD-10-CM   1. Generalized anxiety disorder  F41.1   2. Bipolar 1 disorder, depressed ([place])  F31.9       Archie Balboa, LCAS-A 12/18/2018

## 2018-12-27 ENCOUNTER — Encounter (HOSPITAL_COMMUNITY): Payer: Self-pay | Admitting: Licensed Clinical Social Worker

## 2018-12-27 ENCOUNTER — Ambulatory Visit (INDEPENDENT_AMBULATORY_CARE_PROVIDER_SITE_OTHER): Payer: BC Managed Care – PPO | Admitting: Licensed Clinical Social Worker

## 2018-12-27 DIAGNOSIS — F411 Generalized anxiety disorder: Secondary | ICD-10-CM

## 2018-12-27 DIAGNOSIS — F319 Bipolar disorder, unspecified: Secondary | ICD-10-CM

## 2018-12-27 NOTE — Progress Notes (Signed)
Virtual Visit via Video Note  I connected with Margaret Lopez on 12/27/18 at 10:00 AM EST by a video enabled telemedicine application and verified that I am speaking with the correct person using two identifiers.  Location: Patient: Home Provider: Office   I discussed the limitations of evaluation and management by telemedicine and the availability of in person appointments. The patient expressed understanding and agreed to proceed.    I discussed the assessment and treatment plan with the patient. The patient was provided an opportunity to ask questions and all were answered. The patient agreed with the plan and demonstrated an understanding of the instructions.   The patient was advised to call back or seek an in-person evaluation if the symptoms worsen or if the condition fails to improve as anticipated.  I provided 55 minutes of non-face-to-face time during this encounter.   Archie Balboa, LCAS-A    THERAPIST PROGRESS NOTE  Session Time: [id number]  Participation Level: Active  Behavioral Response: Well GroomedAlertEuthymic  Type of Therapy: Individual Therapy  Treatment Goals addressed: Anxiety  Interventions: Other: IFS, Parts work  Summary: Margaret Lopez is a 48 y.o. female who presents with GAD and hx of dx Bipolar 1. She states she had a helpful productive conversation w/ her boys about their grades/school performance. I guide PT through a mindful exploration of her "critical part" that is "working overtime". I used direct access methodology to help PT gain more insight into this critical part's role in her life. Critical part reveals it is guarding her from experiencing shame. PT then recognizes another part of her she calls her "happy part" which wants more space in her life. We work slowly to allow happy part to have more space while teaching critical part that "blackness will not take over" if she backs off. PT is agreeable to IFS model, and easily navigates the subtle  communications to various parts of herself. At the end of session I teach PT about how to access her "Self" and ask her parts for space. PT wants to work on being more encouraging towards herself and affirming self daily.  Suicidal/Homicidal: Nowithout intent/plan  Therapist Response: I used open questions, curiosity, IFS model of parts therapy, and direct access communication.   Plan: Return again in 2 weeks.  Diagnosis:    ICD-10-CM   1. Generalized anxiety disorder  F41.1   2. Bipolar 1 disorder, depressed ([place])  F31.9      Archie Balboa, LCAS-A 12/27/2018

## 2019-01-09 DIAGNOSIS — J0101 Acute recurrent maxillary sinusitis: Secondary | ICD-10-CM | POA: Diagnosis not present

## 2019-01-09 DIAGNOSIS — J310 Chronic rhinitis: Secondary | ICD-10-CM | POA: Diagnosis not present

## 2019-01-24 ENCOUNTER — Ambulatory Visit (INDEPENDENT_AMBULATORY_CARE_PROVIDER_SITE_OTHER): Payer: BC Managed Care – PPO | Admitting: Psychiatry

## 2019-01-24 ENCOUNTER — Other Ambulatory Visit: Payer: Self-pay

## 2019-01-24 ENCOUNTER — Encounter (HOSPITAL_COMMUNITY): Payer: Self-pay | Admitting: Psychiatry

## 2019-01-24 DIAGNOSIS — F411 Generalized anxiety disorder: Secondary | ICD-10-CM

## 2019-01-24 DIAGNOSIS — F319 Bipolar disorder, unspecified: Secondary | ICD-10-CM | POA: Diagnosis not present

## 2019-01-24 MED ORDER — DIVALPROEX SODIUM ER 500 MG PO TB24: 2000.0000 mg | ORAL_TABLET | Freq: Every day | ORAL | 0 refills | Status: DC

## 2019-01-24 MED ORDER — LITHIUM CARBONATE ER 300 MG PO TBCR: 600.0000 mg | EXTENDED_RELEASE_TABLET | Freq: Every evening | ORAL | 0 refills | Status: DC

## 2019-01-24 MED ORDER — ALPRAZOLAM 0.5 MG PO TABS: 0.5000 mg | ORAL_TABLET | Freq: Two times a day (BID) | ORAL | 3 refills | Status: DC | PRN

## 2019-01-24 NOTE — Progress Notes (Signed)
Virtual Visit via Telephone Note  I connected with DORIANNA HILLIAN on 01/24/19 at  3:30 PM EST by telephone and verified that I am speaking with the correct person using two identifiers.  Location: Patient: home Provider: office   I discussed the limitations, risks, security and privacy concerns of performing an evaluation and management service by telephone and the availability of in person appointments. I also discussed with the patient that there may be a patient responsible charge related to this service. The patient expressed understanding and agreed to proceed.   History of Present Illness: Vashawn shares that the holidays were hard. She was excluded from Thanksgiving and Christmas dinner due to her boyfriend's work with direct patient contact. It did hurt her even though she understood why. Her depression is stable. She is not tearful and is excited about the future. Jojo is working on doing things that bring her joy and working on herself. She is denying any manic or hypomanic like symptoms. Sleep is good and energy is on the low side. Honi has a lot of racing thoughts at bedtime so it takes her some time to fall asleep. Her anxiety is manageable.    Observations/Objective:  There were no vitals taken for this visit.There is no height or weight on file to calculate BMI.  General Appearance: unable to assess  Eye Contact:  unable to assess  Speech:  Clear and Coherent and Normal Rate  Volume:  Normal  Mood:  Euthymic  Affect:  Full Range  Thought Process:  Goal Directed, Linear and Descriptions of Associations: Intact  Orientation:  Full (Time, Place, and Person)  Thought Content:  Logical  Suicidal Thoughts:  No  Homicidal Thoughts:  No  Memory:  Immediate;   Good  Judgement:  Good  Insight:  Good  Psychomotor Activity:  unable to assess  Concentration:  Concentration: Good  Recall:  Good  Fund of Knowledge:  Good  Language:  Good  Akathisia:  unable to assess  Handed:   Right  AIMS (if indicated):     Assets:  Communication Skills Desire for Improvement Financial Resources/Insurance Housing Intimacy Physical Health Resilience Social Support Talents/Skills Transportation Vocational/Educational  ADL's:  Unable to assess  Cognition:  WNL  Sleep:   fair     Assessment and Plan: GAD; Bipolar 1 d/o  Status of current symptoms: stable  Lithium CR 600mg  po qPM  Xanax 0.5mg  po BID prn anxiety  Depakote ER 2000mg  po qHS She was not able to tolerate liquid Depakote or sprinkles  Labs: ordered Depakote level, CBC (platelets), LFT's, Lithium level, CMP with Bun/Creatinine, TSH. Shemika will schedule an appointment to the clinic for blood draw     Follow Up Instructions: In 3 months or sooner if needed   I discussed the assessment and treatment plan with the patient. The patient was provided an opportunity to ask questions and all were answered. The patient agreed with the plan and demonstrated an understanding of the instructions.   The patient was advised to call back or seek an in-person evaluation if the symptoms worsen or if the condition fails to improve as anticipated.  I provided 25 minutes of non-face-to-face time during this encounter.   Charlcie Cradle, MD

## 2019-03-01 ENCOUNTER — Telehealth: Payer: Self-pay | Admitting: Neurology

## 2019-03-01 MED ORDER — DEXAMETHASONE 2 MG PO TABS: ORAL_TABLET | ORAL | 0 refills | Status: DC

## 2019-03-01 NOTE — Telephone Encounter (Signed)
Pt states that she has had a headache since Monday and its not going away. She would like to be advised on what she can do to get rid of it. Please advise.

## 2019-03-01 NOTE — Telephone Encounter (Signed)
I called the patient.  She has had increased headaches prior to her next Botox injection.  Weather changes also worsen her headache.  She is having some nausea but she has Zofran to take. I will send in a 3 Day course of Decadron to see if this can break the headache.

## 2019-03-04 ENCOUNTER — Ambulatory Visit: Payer: BC Managed Care – PPO | Admitting: Family Medicine

## 2019-03-04 ENCOUNTER — Encounter: Payer: Self-pay | Admitting: Family Medicine

## 2019-03-04 ENCOUNTER — Telehealth: Payer: Self-pay | Admitting: Neurology

## 2019-03-04 ENCOUNTER — Telehealth: Payer: Self-pay

## 2019-03-04 ENCOUNTER — Other Ambulatory Visit: Payer: Self-pay

## 2019-03-04 VITALS — BP 97/61 | HR 72 | Temp 97.8°F | Ht 63.0 in | Wt 142.0 lb

## 2019-03-04 DIAGNOSIS — R519 Headache, unspecified: Secondary | ICD-10-CM | POA: Diagnosis not present

## 2019-03-04 DIAGNOSIS — M7918 Myalgia, other site: Secondary | ICD-10-CM

## 2019-03-04 DIAGNOSIS — M5481 Occipital neuralgia: Secondary | ICD-10-CM | POA: Diagnosis not present

## 2019-03-04 DIAGNOSIS — G43711 Chronic migraine without aura, intractable, with status migrainosus: Secondary | ICD-10-CM | POA: Diagnosis not present

## 2019-03-04 NOTE — Progress Notes (Signed)
PATIENT: Margaret Lopez DOB: 1970/02/09  REASON FOR VISIT: follow up HISTORY FROM: patient  Chief Complaint  Patient presents with  . Follow-up    RM8. Has had headache since last monday. Going up neck and both sides of her head.     HISTORY OF PRESENT ILLNESS: Today 03/04/19 Margaret Lopez is a 49 y.o. female here today for follow up for migraines. She was last seen for an office visit in 03/2018 when headaches were continually worsening on Amovig. We restarted Flexeril and started gabapentin. Headaches continued and she was started on Botox in 05/2018. Last procedure about 10 weeks ago. She has done fairly well with Botox but states that about 2 weeks, prior to next procedure, headaches worsen. She is taking Flexeril 10mg  daily. She was advised to talk with dentist about possible mouth guard for TMJ. She is taking gabapentin 200-300mg  at night and Amovig 140mg  monthly. She is also taking divalproex 1500 (prescribed by psychiatry for 2000mg  daily). She uses Zembrace injections (about 8 per month), naratriptan (about 2 per month) and Nurtec (about 6 per month) for abortive therapy. She usually tries Zembrace first. She will take Nurtec next if migraine continues. She will use naratriptan if she has a really bad headache unresponsive to the others.   She has had a bad migraine for the past 4 days. She is having right sided pounding pain in the occipital region of her head. She has sound, light sensitivity as well as nausea. She reports feeling that her "head is dancing" in the occipital region. She has tried all three abortive medications with no relief. Last dose was 3 days ago. Baseline headache days around 15 per month with 8 migraine days. She rarely drinks water.   She was previously treated with BiPAP for OSA. She lost 100lbs with bariatric surgery and BiPAP was stopped in 05/2015. She states that she does not snore. She wakes refreshed. She has a significant family history of migraines.     HISTORY: (copied from my note on 04/06/2018)  Margaret Lopez is a 49 y.o. female for follow up.  Reports that over the last couple of months her headaches have worsened.  Specifically over the last month have become available.  She feels that this is related to weather changes and stress.  She reports that migraines are typically unilateral.  She has noted some tingling sensation of her head recently.  This usually occurs with exacerbation of TMJ previously treated by her ENT provider.  She is currently taking Depakote 2000 mg daily for mood stabilization.  She is receiving Botox treatments every 3 months.  She just started Aimovig about 5 days ago.  She is using is in brace for therapy.  She was taking Flexeril 5 to 10 mg at night for TMJ did seem to help with migraine treatment as well.  She has been out of this medication since December due to no recent follow-up with provider.  She is taking gabapentin in the past prescribed by her psychiatrist for mood.  She reports tolerating this well.   REVIEW OF SYSTEMS: Out of a complete 14 system review of symptoms, the patient complains only of the following symptoms, headaches and all other reviewed systems are negative.  ALLERGIES: Allergies  Allergen Reactions  . Doxazosin Nausea And Vomiting  . Morphine And Related Itching  . Phenergan [Promethazine Hcl]     " knocks out for several hours"  . Aloe Hives  . Penicillins Rash  Has patient had a PCN reaction causing immediate rash, facial/tongue/throat swelling, SOB or lightheadedness with hypotension: No Has patient had a PCN reaction causing severe rash involving mucus membranes or skin necrosis: No Has patient had a PCN reaction that required hospitalization No Has patient had a PCN reaction occurring within the last 10 years: No If all of the above answers are "NO", then may proceed with Cephalosporin use.   . Sulfa Antibiotics Rash    HOME MEDICATIONS: Outpatient Medications Prior to  Visit  Medication Sig Dispense Refill  . AIMOVIG 140 MG/ML SOAJ INJECT 140 MG UNDER THE SKIN EVERY 30 DAYS 1 mL 4  . albuterol (PROVENTIL HFA;VENTOLIN HFA) 108 (90 Base) MCG/ACT inhaler Inhale 1-2 puffs into the lungs every 6 (six) hours as needed for wheezing or shortness of breath.    . ALPRAZolam (XANAX) 0.5 MG tablet Take 1 tablet (0.5 mg total) by mouth 2 (two) times daily as needed for anxiety. 60 tablet 3  . cyclobenzaprine (FLEXERIL) 10 MG tablet Take 0.5-1 tablets (5-10 mg total) by mouth at bedtime as needed. TMJ 90 tablet 4  . dexamethasone (DECADRON) 2 MG tablet Take 3 tablets the first day, 2 the second and 1 the third day 6 tablet 0  . divalproex (DEPAKOTE ER) 500 MG 24 hr tablet Take 4 tablets (2,000 mg total) by mouth daily. 360 tablet 0  . EPINEPHrine 0.3 mg/0.3 mL IJ SOAJ injection Inject 0.3 mg into the muscle daily as needed (for anaphylatic reactions.).   1  . gabapentin (NEURONTIN) 100 MG capsule Take 1 capsule (100 mg total) by mouth 3 (three) times daily. 90 capsule 11  . hydrOXYzine (ATARAX/VISTARIL) 25 MG tablet Take 25 mg by mouth at bedtime as needed for itching.   3  . ipratropium (ATROVENT) 0.06 % nasal spray Place 2 sprays into both nostrils daily as needed. Allergies, runny nose.  4  . levocetirizine (XYZAL) 5 MG tablet Take 5 mg by mouth at bedtime.     [name] lithium carbonate (LITHOBID) 300 MG CR tablet Take 2 tablets (600 mg total) by mouth every evening. 180 tablet 0  . Multiple Vitamins-Minerals (OPURITY BYPASS OPTIMIZED) CHEW Chew 1 tablet by mouth daily.    . ondansetron (ZOFRAN-ODT) 4 MG disintegrating tablet Take 1 tablet (4 mg total) by mouth every 6 (six) hours as needed for nausea or vomiting. 20 tablet 11  . prochlorperazine (COMPAZINE) 10 MG tablet Take 1 tablet (10 mg total) by mouth 3 (three) times daily as needed for nausea or vomiting (migraine). MAY CAUSE SEDATION. 30 tablet 3  . Rimegepant Sulfate (NURTEC) 75 MG TBDP Take 75 mg by mouth daily as  needed. For migraines. Take as close to onset of migraine as possible. One daily maximum. 10 tablet 6  . SUMAtriptan Succinate (ZEMBRACE SYMTOUCH) 3 MG/0.5ML SOAJ Inject 3 mg into the skin every 15 (fifteen) minutes. May repeat again in 2 hours Maximum 4 in one day. 12 pen 11  . valACYclovir (VALTREX) 500 MG tablet Take 500 mg by mouth See admin instructions. Takes 500mg  daily for 5 days when she has a flare up.  3  . naratriptan (AMERGE) 2.5 MG tablet Take one (1) tablet as needed for headache or migraine. Do not exceed two doses of any triptan in one day. 10 tablet 6  . SUMAtriptan (TOSYMRA) 10 MG/ACT SOLN Place 1 spray into the nose every hour as needed. Max 3 sprays in ine day. 1 each 0  . Diclofenac Potassium (CAMBIA) 50  MG PACK Mix 1 pack (50 mg) with 1-2 oz of water and drink. May repeat in 12 hours if needed. Max 2 packs in 24 hours and max 9 packs per month. 9 each 3   No facility-administered medications prior to visit.    PAST MEDICAL HISTORY: Past Medical History:  Diagnosis Date  . Anxiety   . Asthma   . Bipolar disorder ([place])   . Depression   . Deviated nasal septum 02/2011  . Generalized anxiety disorder 04/16/2013  . GERD (gastroesophageal reflux disease)   . Headache(784.0)    migraines  . Insomnia   . Nasal turbinate hypertrophy 02/2011   bilat.  . Neuromuscular disorder [facility])    Dr. Jerilynn Mages. Domingo Cocking- does injections around nerve in her head for prevention of migraines - q 3 months   . Pneumonia    hx of   . PONV (postoperative nausea and vomiting)   . Seasonal allergies     PAST SURGICAL HISTORY: Past Surgical History:  Procedure Laterality Date  . ABDOMINAL HYSTERECTOMY    . CARPAL TUNNEL RELEASE Right   . DIAGNOSTIC LAPAROSCOPY    . EXCISION MASS LOWER EXTREMETIES Left 06/21/2018   Procedure: Excision of left upper leg fat necrosis;  Surgeon: Wallace Going, DO;  Location: [place];  Service: Plastics;  Laterality: Left;  . FOOT SURGERY Right     bone removed from great toe   . GASTRIC ROUX-EN-Y N/A 05/16/2016   Procedure: LAPAROSCOPIC ROUX-EN-Y GASTRIC BYPASS WITH UPPER ENDOSCOPY;  Surgeon: Clovis Riley, MD;  Location: [facility];  Service: General;  Laterality: N/A;  . LAPAROSCOPIC VAGINAL HYSTERECTOMY  03/14/2007  . LIPOMA EXCISION Left 08/26/2016   Procedure: EXCISION LIPOMA FROM LEFT UPPER POSTERIOR THIGH;  Surgeon: Clovis Riley, MD;  Location: [place];  Service: General;  Laterality: Left;  . MYRINGOTOMY WITH TUBE PLACEMENT Bilateral 03/02/2015   Procedure: MYRINGOTOMY WITH T-TUBE PLACEMENT;  Surgeon: Leta Baptist, MD;  Location: [place];  Service: ENT;  Laterality: Bilateral;  . NASAL SEPTOPLASTY W/ TURBINOPLASTY  02/22/2011   Procedure: NASAL SEPTOPLASTY WITH TURBINATE REDUCTION;  Surgeon: Ascencion Dike, MD;  Location: [place];  Service: ENT;  Laterality: Bilateral;  . TUBAL LIGATION  12/16/2004  . TYMPANOSTOMY TUBE PLACEMENT Bilateral 07/21/2017    FAMILY HISTORY: Family History  Problem Relation Age of Onset  . ADD / ADHD Sister   . Hypertension Mother   . Sarcoidosis Mother   . Rheum arthritis Mother   . Hypertension Father   . Heart Problems Father   . Alcohol abuse Maternal Grandmother   . Bipolar disorder Maternal Grandmother   . Breast cancer Maternal Grandmother   . Breast cancer Maternal Aunt   . Heart Problems Other        dad's side of family  . Suicidality Neg Hx     SOCIAL HISTORY: Social History   Socioeconomic History  . Marital status: Legally Separated    Spouse name: Not on file  . Number of children: 2  . Years of education: Not on file  . Highest education level: Some college, no degree  Occupational History  . Not on file  Tobacco Use  . Smoking status: Former Smoker    Packs/day: 0.50    Years: 4.00    Pack years: 2.00    Types: Cigarettes    Quit date: 11/12/2015    Years since quitting: 3.3  . Smokeless tobacco: Never Used  Substance and  Sexual  Activity  . Alcohol use: No    Alcohol/week: 0.0 standard drinks    Comment: once a month or less  . Drug use: No  . Sexual activity: Yes    Partners: Male    Birth control/protection: Surgical    Comment: Hysterectomy  Other Topics Concern  . Not on file  Social History Narrative   Lives at home with boyfriend   Right handed   Caffeine: 2 cups daily   Social Determinants of Health   Financial Resource Strain:   . Difficulty of Paying Living Expenses: Not on file  Food Insecurity:   . Worried About Charity fundraiser in the Last Year: Not on file  . Ran Out of Food in the Last Year: Not on file  Transportation Needs:   . Lack of Transportation (Medical): Not on file  . Lack of Transportation (Non-Medical): Not on file  Physical Activity:   . Days of Exercise per Week: Not on file  . Minutes of Exercise per Session: Not on file  Stress:   . Feeling of Stress : Not on file  Social Connections:   . Frequency of Communication with Friends and Family: Not on file  . Frequency of Social Gatherings with Friends and Family: Not on file  . Attends Religious Services: Not on file  . Active Member of Clubs or Organizations: Not on file  . Attends Archivist Meetings: Not on file  . Marital Status: Not on file  Intimate Partner Violence:   . Fear of Current or Ex-Partner: Not on file  . Emotionally Abused: Not on file  . Physically Abused: Not on file  . Sexually Abused: Not on file      PHYSICAL EXAM  Vitals:   03/04/19 1103  BP: 97/61  Pulse: 72  Temp: 97.8 F (36.6 C)  Weight: 142 lb (64.4 kg)  Height: 5\' 3"  (1.6 m)   Body mass index is 25.15 kg/m.  Generalized: Well developed, in no acute distress  Cardiology: normal rate and rhythm, no murmur noted Respiratory: clear to auscultation bilaterally Neurological examination  Mentation: Alert oriented to time, place, history taking. Follows all commands speech and language fluent Cranial nerve II-XII:  Pupils were equal round reactive to light. Extraocular movements were full, visual field were full on confrontational test. Facial sensation and strength were normal. Uvula tongue midline. Head turning and shoulder shrug  were normal and symmetric. Motor: The motor testing reveals 5 over 5 strength of all 4 extremities. Good symmetric motor tone is noted throughout.  Sensory: Sensory testing is intact to soft touch on all 4 extremities. No evidence of extinction is noted.  Coordination: Cerebellar testing reveals good finger-nose-finger and heel-to-shin bilaterally.  Gait and station: Gait is normal.   DIAGNOSTIC DATA (LABS, IMAGING, TESTING) - I reviewed patient records, labs, notes, testing and imaging myself where available.  No flowsheet data found.   Lab Results  Component Value Date   WBC 9.2 01/05/2018   HGB 11.1 01/05/2018   HCT 34.4 01/05/2018   MCV 97 01/05/2018   PLT 210 01/05/2018      Component Value Date/Time   NA 140 01/05/2018 0825   K 5.0 01/05/2018 0825   CL 101 01/05/2018 0825   CO2 24 01/05/2018 0825   GLUCOSE 80 01/05/2018 0825   GLUCOSE 97 11/24/2016 1134   BUN 10 01/05/2018 0825   CREATININE 0.58 01/05/2018 0825   CALCIUM 9.4 01/05/2018 0825   PROT 5.4 (L)  01/05/2018 0825   ALBUMIN 3.8 01/05/2018 0825   AST 16 01/05/2018 0825   ALT 9 01/05/2018 0825   ALKPHOS 45 01/05/2018 0825   BILITOT 0.2 01/05/2018 0825   GFRNONAA 112 01/05/2018 0825   GFRAA 129 01/05/2018 0825   No results found for: CHOL, HDL, LDLCALC, LDLDIRECT, TRIG, CHOLHDL No results found for: HGBA1C No results found for: VITAMINB12 Lab Results  Component Value Date   TSH 2.860 02/22/2018     ASSESSMENT AND PLAN 49 y.o. year old female  has a past medical history of Anxiety, Asthma, Bipolar disorder ([place]), Depression, Deviated nasal septum (02/2011), Generalized anxiety disorder (04/16/2013), GERD (gastroesophageal reflux disease), Headache(784.0), Insomnia, Nasal turbinate hypertrophy  (02/2011), Neuromuscular disorder ([place]), Pneumonia, PONV (postoperative nausea and vomiting), and Seasonal allergies. here with     ICD-10-CM   1. Chronic migraine without aura, with intractable migraine, so stated, with status migrainosus  G43.711   2. Cervical myofascial pain syndrome  M79.18   3. Acute intractable headache, unspecified headache type  R51.9   4. Bilateral occipital neuralgia  M54.81     Margaret Lopez has noticed improvement in migraine intensity and frequency since starting Botox therapy, however, she continues to have breakthrough migraines. These are worse with weather changes and occur most frequently 1-2 weeks prior to next Botox treatment. She has an acute migraine today that has features of overlapping occipital neuralgia. We will perform bilateral occipital nerve blocks today for pain relief. She will take naratriptan and Benadryl once she gets home and can rest. I have advised that she continue current treatment plan with Amovig monthly, Botox every 12 weeks, gabapentin 300mg  and Flexeril 10mg  at bedtime. We have discussed need for mouth guard as well as TMJ most likely contributing. She will continue Zembrace, Nurtec and naratriptan as needed for abortive therapy but was warned against regular use. I have asked that she increase her water intake to 50 ounces daily. She will continue close follow up with PCP and psychiatry for mental health. She will follow up in the office in 1 year, Botox every 12 weeks.      No orders of the defined types were placed in this encounter.    No orders of the defined types were placed in this encounter.     I spent 30 minutes with the patient. 50% of this time was spent counseling and educating patient on plan of care and medications.     Debbora Presto, FNP-C 03/04/2019, 12:43 PM [facility] [address], [place], [place] [address] [phone]

## 2019-03-04 NOTE — Progress Notes (Addendum)
History: See office visit notes        Bupivicaine injection/Lidocaine protocol for occipital neuralgia   Performed by Debbora Presto, FNP.30-gauge needle was used. All procedures as documented were medically necessary, reasonable and appropriate based on the patient's history, medical diagnosis and physician opinion. Verbal informed consent was obtained from the patient, patient was informed of potential risk of procedure, including bruising, bleeding, hematoma formation, infection, muscle weakness, muscle pain, numbness, transient hypertension, transient hyperglycemia and transient insomnia among others. All areas injected were topically clean with isopropyl rubbing alcohol. Nonsterile nonlatex gloves were worn during the procedure.   1. Greater occipital nerve block [phone]). The greater occipital nerve site was identified bilaterally at the nuchal line medial to the occipital artery. Medication was injected into the left and right occipital nerve areas and suboccipital areas. Patient's condition is associated with inflammation of the greater occipital nerve and associated multiple groups. Injection was deemed medically necessary, reasonable and appropriate. Injection represents a separate and unique surgical service. 52ml of bupivicaine/lidocaine was used.      The patient tolerated the injections well, no complications of the procedure were noted. Injections were made with a 30-gauge needle.   Made any corrections needed, and agree with history, physical, neuro exam,assessment, procedures and plan as stated.     Sarina Ill, MD [facility]

## 2019-03-04 NOTE — Telephone Encounter (Signed)
(  For documentation purposes only) FYI: I put this in the note already, this is just incase.    03/04/19 Nerve block w/o steroid Nerve block. 2 43mL syringes.  Lidocaine 2% (Qty:2 count) 1.5 mL Lot [id number] EXP: 07/11/2019 Open:02/25/19  0.5% Bupivacaine (Qty:2 count) 1.53mL Batch:[id number] Exp: 11/21  Pt signed and dated release. Had no questions nor concerns.

## 2019-03-04 NOTE — Telephone Encounter (Signed)
I am happy to perform nerve blocks if needed. Is she having generalized pain or a specific location? Would infusion be better if generalized migraine? I have several openings if she needs a nerve block.

## 2019-03-04 NOTE — Progress Notes (Signed)
Nerve block w/o steroid Nerve block. 2 54mL syringes.  Lidocaine 2% (Qty:2 count) 1.5 mL Lot [id number] EXP: 07/11/2019 Open:02/25/19  0.5% Bupivacaine (Qty:2 count) 1.65mL Batch:[id number] Exp: 11/21  Pt signed and dated release. Had no questions nor concerns.

## 2019-03-04 NOTE — Telephone Encounter (Signed)
Either is fine from my perspepective. We should also see how often she is calling for this and if it is a lot we probably need her to follow up instead.

## 2019-03-04 NOTE — Telephone Encounter (Signed)
Pt. Called stating that she has had a headache for several day's and is wanting to know if she can get worked in to get some numbing shots. Please advise.

## 2019-03-04 NOTE — Patient Instructions (Addendum)
Continue current medication regimen.   We performed bilateral occipital nerve blocks today in the office. I recommend taking naratriptan and Benadryl (25-50mg ) today once you get home. Rest and make sure you get 50 ounces of water in daily.   Return next week for Botox procedure. Please call sooner if needed    Selective Nerve Root Block, Care After This sheet gives you information about how to care for yourself after your procedure. Your health care provider may also give you more specific instructions. If you have problems or questions, contact your health care provider. What can I expect after the procedure? After the procedure, it is common to feel some discomfort at the injection (puncture) site. The injection may temporarily reduce or eliminate pain in the part of the body that is supplied by the nerve. Follow these instructions at home: Activity  Do not drive or use heavy machinery while taking prescription pain medicine.  Do not drive for 24 hours if you were given a medicine to help you relax (sedative) during the procedure.  Do not lift anything that is heavier than 10 lb (4.5 kg), or the limit that you are told, until your health care provider says that it is safe.  Return to your normal activities as directed. Ask your health care provider what activities are safe for you. Puncture site care  Remove your bandage (dressing) 24 hours after your procedure, or as told by your health care provider.  Check your puncture site every day for signs of infection. Check for: ? Redness, swelling, or pain. ? Warmth. ? Fluid or blood. ? Pus or a bad smell. General instructions   Take over-the-counter and prescription medicines only as told by your health care provider.  Do not take showers or baths, swim, or use a hot tub until your health care provider approves. In most cases, you may shower and bathe starting 24 hours after your procedure.  If directed, put ice on the puncture  area to relieve discomfort. ? Put ice in a plastic bag. ? Place a towel between your skin and the bag. ? Leave the ice on for 20 minutes, 2-3 times a day.  Keep all follow-up visits as told by your health care provider. This is important. Contact a health care provider if:  You have redness, swelling, or pain around your puncture site.  You have fluid or blood coming from your puncture site.  You have pus or a bad smell coming from your puncture site.  Your puncture site feels warm to the touch.  You have a fever. Get help right away if:  You have: ? Shortness of breath. ? A severe headache. ? Pain that gets worse.  You develop weakness or loss of feeling (numbness).  You develop muscle weakness or paralysis. Summary  Do not drive for 24 hours if you were given a sedative.  Check your puncture site every day for signs of infection.  Return to your normal activities as directed. Ask your health care provider what activities are safe for you.  Take over-the-counter and prescription medicines only as told by your health care provider. This information is not intended to replace advice given to you by your health care provider. Make sure you discuss any questions you have with your health care provider. Document Revised: 12/09/2016 Document Reviewed: 10/07/2016 Elsevier Patient Education  [place]. Migraine Headache A migraine headache is a very strong throbbing pain on one side or both sides of your head. This  type of headache can also cause other symptoms. It can last from 4 hours to 3 days. Talk with your doctor about what things may bring on (trigger) this condition. What are the causes? The exact cause of this condition is not known. This condition may be triggered or caused by:  Drinking alcohol.  Smoking.  Taking medicines, such as: ? Medicine used to treat chest pain (nitroglycerin). ? Birth control pills. ? Estrogen. ? Some blood pressure  medicines.  Eating or drinking certain products.  Doing physical activity. Other things that may trigger a migraine headache include:  Having a menstrual period.  Pregnancy.  Hunger.  Stress.  Not getting enough sleep or getting too much sleep.  Weather changes.  Tiredness (fatigue). What increases the risk?  Being 67-61 years old.  Being female.  Having a family history of migraine headaches.  Being Caucasian.  Having depression or anxiety.  Being very overweight. What are the signs or symptoms?  A throbbing pain. This pain may: ? Happen in any area of the head, such as on one side or both sides. ? Make it hard to do daily activities. ? Get worse with physical activity. ? Get worse around bright lights or loud noises.  Other symptoms may include: ? Feeling sick to your stomach (nauseous). ? Vomiting. ? Dizziness. ? Being sensitive to bright lights, loud noises, or smells.  Before you get a migraine headache, you may get warning signs (an aura). An aura may include: ? Seeing flashing lights or having blind spots. ? Seeing bright spots, halos, or zigzag lines. ? Having tunnel vision or blurred vision. ? Having numbness or a tingling feeling. ? Having trouble talking. ? Having weak muscles.  Some people have symptoms after a migraine headache (postdromal phase), such as: ? Tiredness. ? Trouble thinking (concentrating). How is this treated?  Taking medicines that: ? Relieve pain. ? Relieve the feeling of being sick to your stomach. ? Prevent migraine headaches.  Treatment may also include: ? Having acupuncture. ? Avoiding foods that bring on migraine headaches. ? Learning ways to control your body functions (biofeedback). ? Therapy to help you know and deal with negative thoughts (cognitive behavioral therapy). Follow these instructions at home: Medicines  Take over-the-counter and prescription medicines only as told by your doctor.  Ask your  doctor if the medicine prescribed to you: ? Requires you to avoid driving or using heavy machinery. ? Can cause trouble pooping (constipation). You may need to take these steps to prevent or treat trouble pooping:  Drink enough fluid to keep your pee (urine) pale yellow.  Take over-the-counter or prescription medicines.  Eat foods that are high in fiber. These include beans, whole grains, and fresh fruits and vegetables.  Limit foods that are high in fat and sugar. These include fried or sweet foods. Lifestyle  Do not drink alcohol.  Do not use any products that contain nicotine or tobacco, such as cigarettes, e-cigarettes, and chewing tobacco. If you need help quitting, ask your doctor.  Get at least 8 hours of sleep every night.  Limit and deal with stress. General instructions      Keep a journal to find out what may bring on your migraine headaches. For example, write down: ? What you eat and drink. ? How much sleep you get. ? Any change in what you eat or drink. ? Any change in your medicines.  If you have a migraine headache: ? Avoid things that make your symptoms worse, such  as bright lights. ? It may help to lie down in a dark, quiet room. ? Do not drive or use heavy machinery. ? Ask your doctor what activities are safe for you.  Keep all follow-up visits as told by your doctor. This is important. Contact a doctor if:  You get a migraine headache that is different or worse than others you have had.  You have more than 15 headache days in one month. Get help right away if:  Your migraine headache gets very bad.  Your migraine headache lasts longer than 72 hours.  You have a fever.  You have a stiff neck.  You have trouble seeing.  Your muscles feel weak or like you cannot control them.  You start to lose your balance a lot.  You start to have trouble walking.  You pass out (faint).  You have a seizure. Summary  A migraine headache is a very  strong throbbing pain on one side or both sides of your head. These headaches can also cause other symptoms.  This condition may be treated with medicines and changes to your lifestyle.  Keep a journal to find out what may bring on your migraine headaches.  Contact a doctor if you get a migraine headache that is different or worse than others you have had.  Contact your doctor if you have more than 15 headache days in a month. This information is not intended to replace advice given to you by your health care provider. Make sure you discuss any questions you have with your health care provider. Document Revised: 04/20/2018 Document Reviewed: 02/08/2018 Elsevier Patient Education  [place].

## 2019-03-04 NOTE — Telephone Encounter (Signed)
Spoke with pt. Denied any new new migraine or any concerning symptoms. She has pain mostly in neck that comes up through head. Pt was offered 11:00 appt today with Amy NP. Amy NP aware of pt's symptoms. Plan to try nerve blocks or infusion. Pt verbalized appreciation.

## 2019-03-04 NOTE — Telephone Encounter (Signed)
See phone note. Pt seeing NP today.

## 2019-03-05 MED ORDER — BOTOX 100 UNITS IJ SOLR: INTRAMUSCULAR | 1 refills | Status: DC

## 2019-03-05 NOTE — Telephone Encounter (Signed)
Margaret Lopez would you send script for Botox to [facility]?

## 2019-03-07 NOTE — Telephone Encounter (Signed)
Noted, thank you.   [id number] (03/06/20). DWD

## 2019-03-12 ENCOUNTER — Ambulatory Visit: Payer: BC Managed Care – PPO | Admitting: Neurology

## 2019-03-13 ENCOUNTER — Other Ambulatory Visit: Payer: Self-pay | Admitting: Neurology

## 2019-03-14 ENCOUNTER — Telehealth: Payer: Self-pay | Admitting: *Deleted

## 2019-03-14 NOTE — Telephone Encounter (Signed)
Pt sent mychart message to me after seeing missed call. I called her and explained message below. Pt prefers for now to stop Aimovig effective immediately try to use botox alone. We will inject samples for the next round while deciding for sure which route to go. Pt accepted appt for Tues 3/9 @ 1:00 pm. She verbalized appreciation. Dr. Jaynee Eagles also made aware of pt's wishes   I called [facility] and canceled the [place].

## 2019-03-14 NOTE — Telephone Encounter (Signed)
Spoke with [employer]. [employer] plan will not allow aimovig (any CGRP) and botox. Spoke with Dr. Jaynee Eagles. Pt will need to choose. Can use use samples this one time for next injection while pt decides.   I called the pt and LVM (ok per DPR) letting her know the above info. I asked her to call us back during office hours and let us know. I also offered the pt an injection appt next Tues 3/9 @ 1:00 pm. If the patient calls back and appt is still available please schedule her. mychart message sent to her as well.

## 2019-03-18 NOTE — Telephone Encounter (Signed)
Noted, thank you

## 2019-03-19 ENCOUNTER — Ambulatory Visit (INDEPENDENT_AMBULATORY_CARE_PROVIDER_SITE_OTHER): Payer: BC Managed Care – PPO | Admitting: Neurology

## 2019-03-19 ENCOUNTER — Other Ambulatory Visit: Payer: Self-pay

## 2019-03-19 ENCOUNTER — Encounter: Payer: Self-pay | Admitting: Neurology

## 2019-03-19 VITALS — Temp 97.8°F

## 2019-03-19 DIAGNOSIS — G43711 Chronic migraine without aura, intractable, with status migrainosus: Secondary | ICD-10-CM

## 2019-03-19 IMAGING — CR DG CHEST 2V
2 series · 2 of 2 positions shown · non-contrast
Comparison: 12/16/2014

CLINICAL DATA: Bronchitis. Cough and congestion for 4 weeks.
Ex-smoker.

EXAM:
CHEST  2 VIEW

[w chest pa]
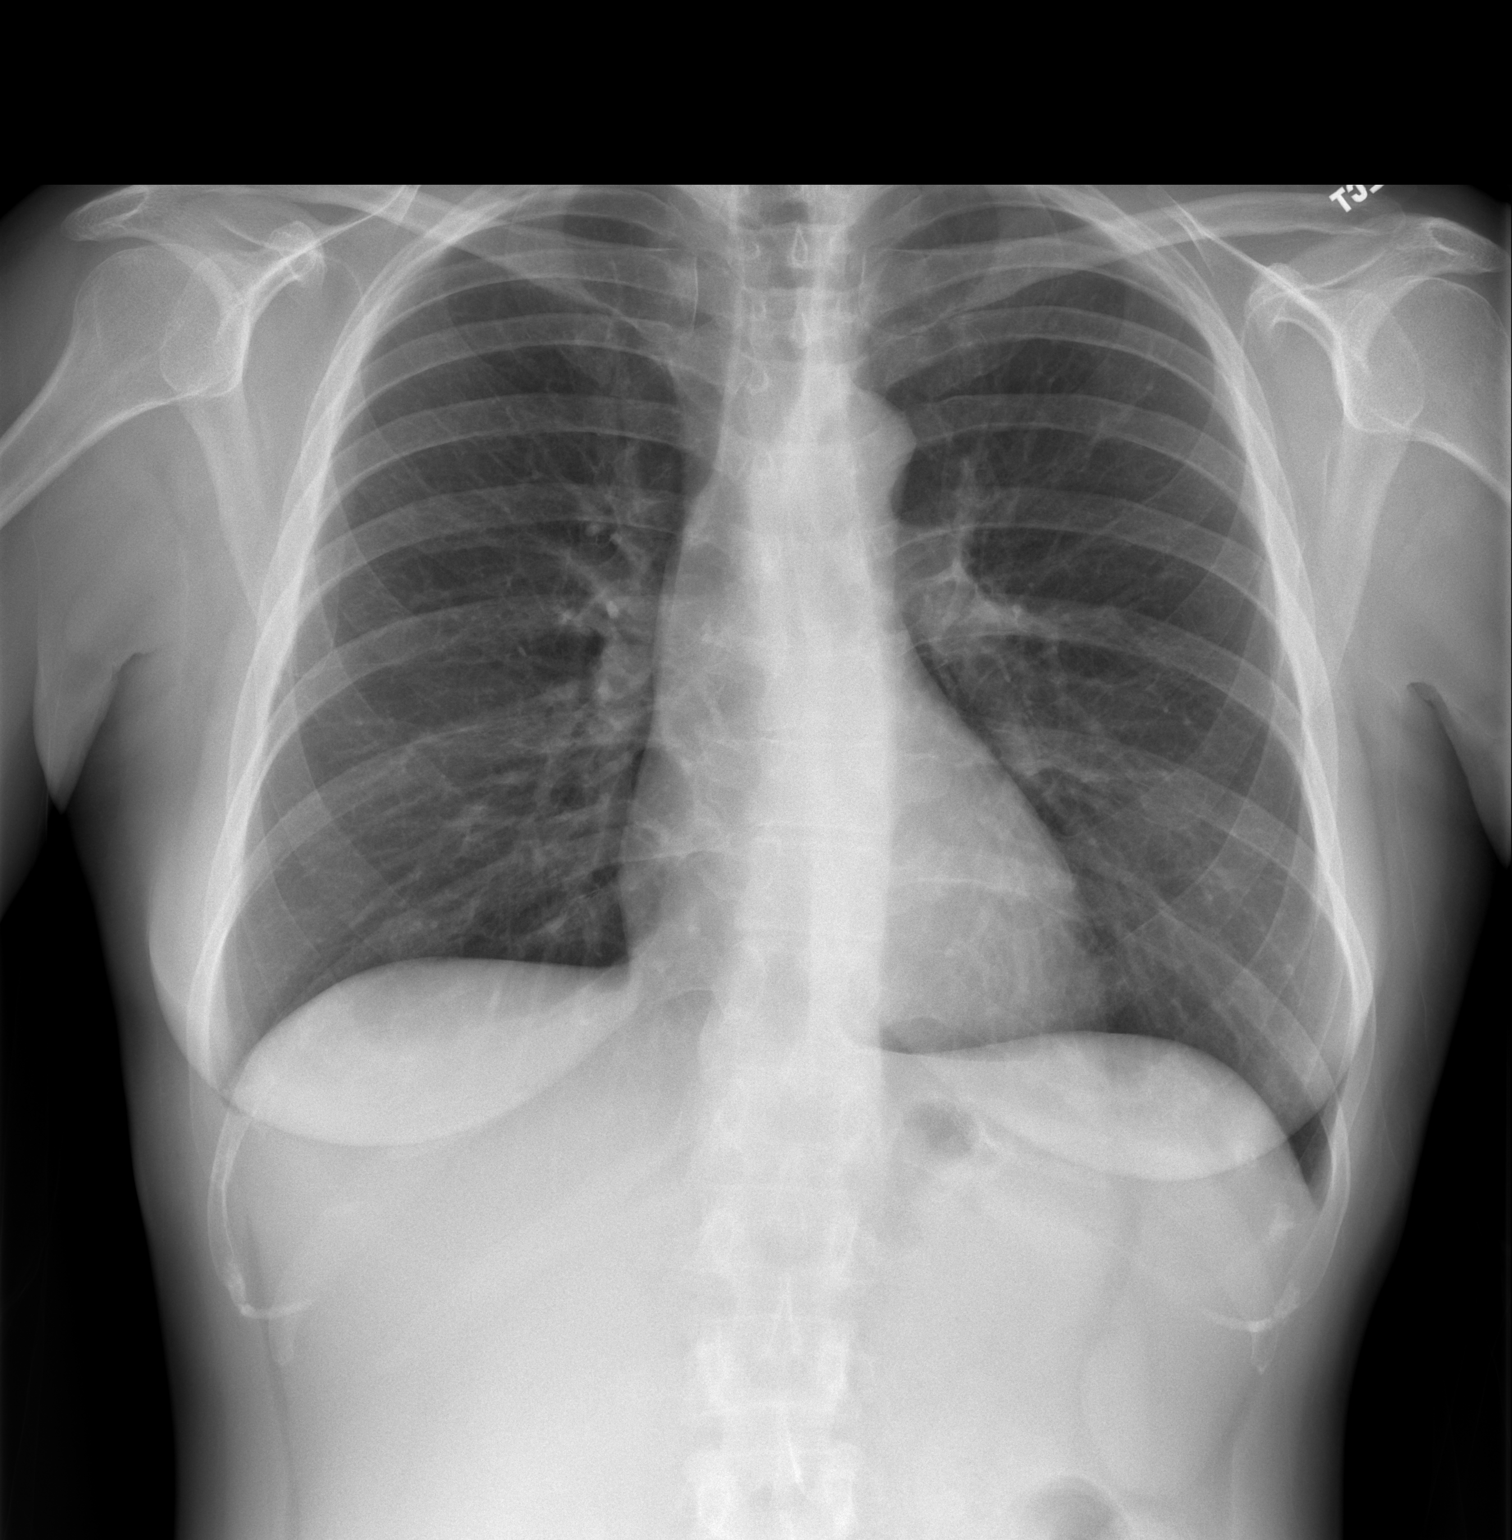

[w chest lat]
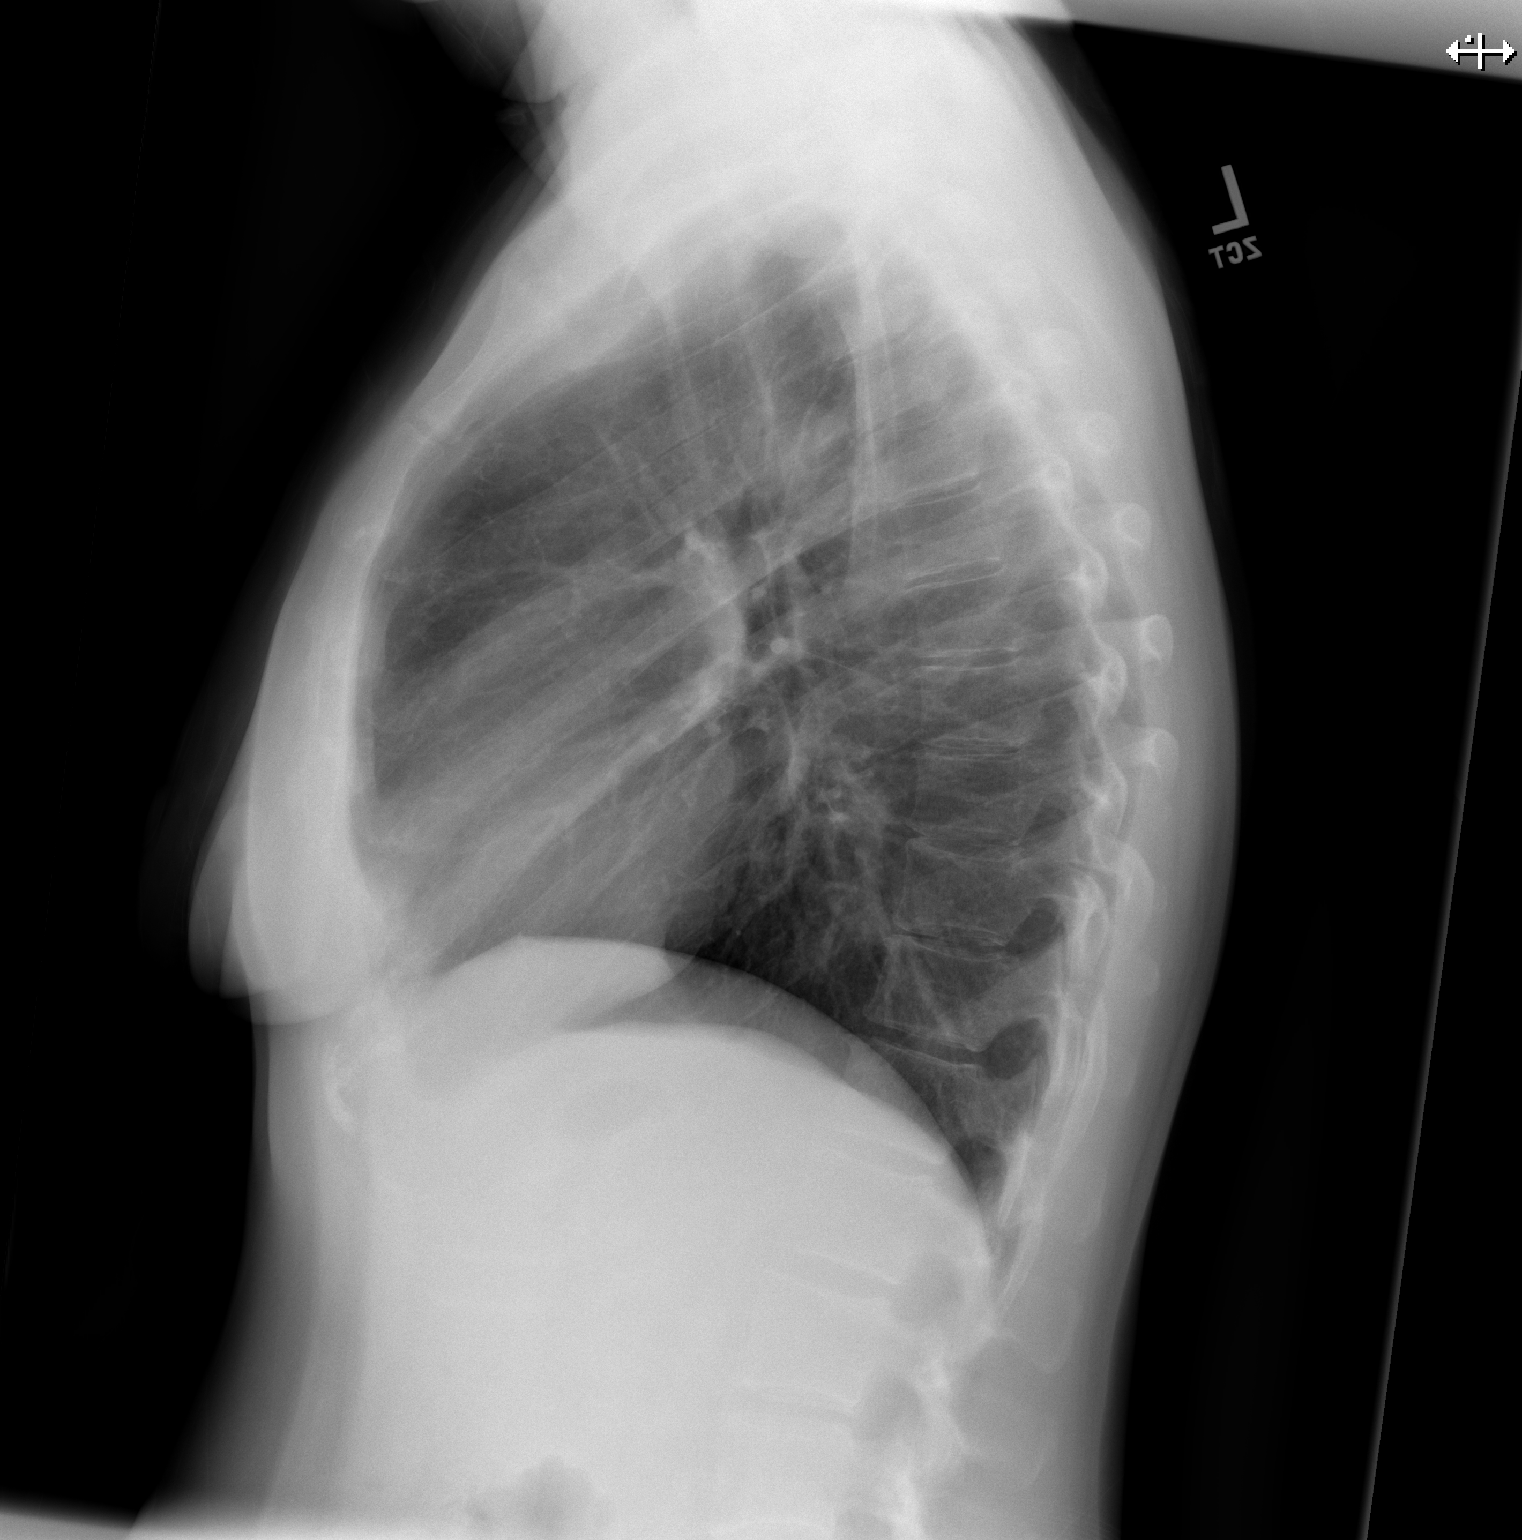

[2 of 2 positions shown; findings below may reference images not displayed]

FINDINGS: S shaped thoracolumbar spine curvature. Midline trachea. Normal
heart size and mediastinal contours. No pleural effusion or
pneumothorax. Clear lungs.
IMPRESSION: No acute cardiopulmonary disease.

## 2019-03-19 MED ORDER — METHYLPREDNISOLONE 4 MG PO TBPK: ORAL_TABLET | ORAL | 1 refills | Status: DC

## 2019-03-19 NOTE — Progress Notes (Signed)
Botox- 200 units x 1 vial Lot: [id number] Expiration: 07/2021 NDC: [id number]  Bacteriostatic 0.9% Sodium Chloride- 60mL total Lot: [id number] Expiration: 04/10/2021 NDC: [id number]  Dx: [id number] samples

## 2019-03-19 NOTE — Progress Notes (Signed)
Consent Form Botulism Toxin Injection For Chronic Migraine  Interval history 03/19/2019: Baseline 15 headache days a month and 10 migraine days a month. Excellent response, she has > 75% reduction in migraines and headaches monthly. Discussed acute management prescribed frova, zembrace works quickly but the migraines rebound. Did great on Nurtec and we prescribed and she continues to take. Patient felt her forehead and eyebrows were uncomfortable due to inability to move them, reduced injections 1/2 injections in the corrigators and procerus, did the frontal very high and was much better. +10 each masseter, +5 each lateral pterygoid, +5 bilat levator scapulae. +temples.  Dry needling gave her a very bad migraine will not order that again.    Reviewed orally with patient, additionally signature is on file:  Botulism toxin has been approved by the Federal drug administration for treatment of chronic migraine. Botulism toxin does not cure chronic migraine and it may not be effective in some patients.  The administration of botulism toxin is accomplished by injecting a small amount of toxin into the muscles of the neck and head. Dosage must be titrated for each individual. Any benefits resulting from botulism toxin tend to wear off after 3 months with a repeat injection required if benefit is to be maintained. Injections are usually done every 3-4 months with maximum effect peak achieved by about 2 or 3 weeks. Botulism toxin is expensive and you should be sure of what costs you will incur resulting from the injection.  The side effects of botulism toxin use for chronic migraine may include:   -Transient, and usually mild, facial weakness with facial injections  -Transient, and usually mild, head or neck weakness with head/neck injections  -Reduction or loss of forehead facial animation due to forehead muscle weakness  -Eyelid drooping  -Dry eye  -Pain at the site of injection or bruising at the site  of injection  -Double vision  -Potential unknown long term risks  Contraindications: You should not have Botox if you are pregnant, nursing, allergic to albumin, have an infection, skin condition, or muscle weakness at the site of the injection, or have myasthenia gravis, Lambert-Eaton syndrome, or ALS.  It is also possible that as with any injection, there may be an allergic reaction or no effect from the medication. Reduced effectiveness after repeated injections is sometimes seen and rarely infection at the injection site may occur. All care will be taken to prevent these side effects. If therapy is given over a long time, atrophy and wasting in the muscle injected may occur. Occasionally the patient's become refractory to treatment because they develop antibodies to the toxin. In this event, therapy needs to be modified.  I have read the above information and consent to the administration of botulism toxin.    BOTOX PROCEDURE NOTE FOR MIGRAINE HEADACHE    Contraindications and precautions discussed with patient(above). Aseptic procedure was observed and patient tolerated procedure. Procedure performed by Dr. Georgia Dom  The condition has existed for more than 6 months, and pt does not have a diagnosis of ALS, Myasthenia Gravis or Lambert-Eaton Syndrome.  Risks and benefits of injections discussed and pt agrees to proceed with the procedure.  Written consent obtained  These injections are medically necessary. Pt  receives good benefits from these injections. These injections do not cause sedations or hallucinations which the oral therapies may cause.  Indication/Diagnosis: chronic migraine BOTOX(J0585) injection was performed according to protocol by [employer]. 200 units of BOTOX was dissolved into 4 cc NS.  NDC: [phone]   Description of procedure:  The patient was placed in a sitting position. The standard protocol was used for Botox as follows, with 5 units of Botox injected  at each site:   -Procerus muscle, midline injection 2.5 units   -Corrugator muscle, bilateral injection 2.5 units each  -Frontalis muscle, bilateral injection, with 2 sites each side, medial injection was performed in the upper one third of the frontalis muscle, in the region vertical from the medial inferior edge of the superior orbital rim. The lateral injection was again in the upper one third of the forehead vertically above the lateral limbus of the cornea, 1.5 cm lateral to the medial injection site.  - Levator Scapulae: 5 units bilaterally   -Temporalis muscle injection, 5 sites, bilaterally. The first injection was 3 cm above the tragus of the ear, second injection site was 1.5 cm to 3 cm up from the first injection site in line with the tragus of the ear. The third injection site was 1.5-3 cm forward between the first 2 injection sites. The fourth injection site was 1.5 cm posterior to the second injection site. 5th site laterally in the temporalis at the level of the outer canthus.  - Patient feels her clenching is a trigger for headaches. +10 units masseter bilaterally and +5 units bilater lateral pterygoids.  -Occipitalis muscle injection, 3 sites, bilaterally. The first injection was done one half way between the occipital protuberance and the tip of the mastoid process behind the ear. The second injection site was done lateral and superior to the first, 1 fingerbreadth from the first injection. The third injection site was 1 fingerbreadth superiorly and medially from the first injection site.  -Cervical paraspinal muscle injection, 2 sites, bilateral knee first injection site was 1 cm from the midline of the cervical spine, 3 cm inferior to the lower border of the occipital protuberance. The second injection site was 1.5 cm superiorly and laterally to the first injection site.  -Trapezius muscle injection was performed at 3 sites, bilaterally. The first injection site was in the  upper trapezius muscle halfway between the inflection point of the neck, and the acromion. The second injection site was one half way between the acromion and the first injection site. The third injection was done between the first injection site and the inflection point of the neck.   Will return for repeat injection in 3 months.   198 unit sof Botox was used, 198 units were injected, the rest of the Botox was wasted. The patient tolerated the procedure well, there were no complications of the above procedure.   I spent 25  minutes of face-to-face and non-face-to-face time with patient on the  1. Chronic migraine without aura, with intractable migraine, so stated, with status migrainosus    diagnosis.  This included previsit chart review, lab review, study review, order entry, electronic health record documentation, patient education on the different diagnostic and therapeutic options, counseling and coordination of care, risks and benefits of management, compliance, or risk factor reduction

## 2019-03-21 ENCOUNTER — Telehealth: Payer: Self-pay | Admitting: Skilled Nursing Facility1

## 2019-03-21 NOTE — Telephone Encounter (Signed)
You can try opening the capsule into yogurt and be sure to get a one a day so you do not have to worry about it more than once.  -----Original Message----- From: [email or web address]  Sent: Thursday, March 21, 2019 3:33 PM To: Reizy [email or web address]> Subject: RE: Vitamins  Yes. I've tried the capsule and it gives me indigestion for some reason. I guess I will have to suck it up and get the bariatric ones down.  Thanks! [name]   --- Originally sent by [email or web address] on Mar 21, 2019 3:29 PM ---                                                                            This                       message was sent securely by                                                            [facility].                                                                             I do not recommend taking a multi that is not     bariatric specific. Have you tried the capsule multivitamins instead     of the chewable?                       From: Cala [email or web address]>         Sent: Thursday, March 21, 2019 3:24 PM        To: Naresh [email or web address]>        Subject: Vitamins                        *Caution         - External email - see footer for warnings*                  Gaylan Gerold! In our bariatric fb group, one of       the ladies was saying that she takes [employer]       instead of the awful chewable vitamins. I checked the label and I       know they aren't as strong as  the bariatric ones. I haven't been       taking any vitamins for a while and I have been feeling a bit run       down lately. I've also noticed that my nails have a lot of ridges       and are a bit wavy. Not sure if that's related. What is your       opinion on taking the smaller vitamins?                Thanks!                   Naturi Mccorkle                   dob 03-25-1970           WARNING: This email originated outside of [facility]. Even if this looks like a [employer], it is not.   Do not provide your username, password, or any other personal information in response to this or any other email.   [facility] will never ask you for your username or password via email.   DO NOT CLICK links or attachments unless you are positive the content is safe.   If in doubt about the safety of this message, select the [employer] button, which forwards to IT Security.

## 2019-03-27 ENCOUNTER — Other Ambulatory Visit: Payer: Self-pay | Admitting: Neurology

## 2019-04-03 ENCOUNTER — Ambulatory Visit: Payer: BC Managed Care – PPO | Admitting: Neurology

## 2019-04-22 ENCOUNTER — Other Ambulatory Visit: Payer: Self-pay | Admitting: Neurology

## 2019-04-30 ENCOUNTER — Telehealth (HOSPITAL_COMMUNITY): Payer: Self-pay | Admitting: *Deleted

## 2019-04-30 ENCOUNTER — Other Ambulatory Visit (HOSPITAL_COMMUNITY): Payer: Self-pay | Admitting: *Deleted

## 2019-04-30 DIAGNOSIS — Z5181 Encounter for therapeutic drug level monitoring: Secondary | ICD-10-CM

## 2019-04-30 NOTE — Telephone Encounter (Signed)
Writer returned pt call from 04/29/19 regarding lab work. Writer read last note from you and has ordered labs that were in note, Valproic Acid level, CBC w/diff, hepatic function panel, Lithium level, CMp with BuN/creatinine. Pt to have labs drawn at [facility]. [address], [place]. Lab orders faxed to lab.

## 2019-05-01 LAB — CMP14+EGFR
ALT: 5 IU/L (ref 0–32)
AST: 15 IU/L (ref 0–40)
Albumin/Globulin Ratio: 2.8 — ABNORMAL HIGH (ref 1.2–2.2)
Albumin: 4.4 g/dL (ref 3.8–4.8)
Alkaline Phosphatase: 61 IU/L (ref 39–117)
BUN/Creatinine Ratio: 16 (ref 9–23)
BUN: 13 mg/dL (ref 6–24)
Bilirubin Total: <0.2 mg/dL (ref 0.0–1.2)
CO2: 23 mmol/L (ref 20–29)
Calcium: 9.3 mg/dL (ref 8.7–10.2)
Chloride: 102 mmol/L (ref 96–106)
Creatinine, Ser: 0.83 mg/dL (ref 0.57–1.00)
GFR calc Af Amer: 98 mL/min/{1.73_m2} (ref >59)
GFR calc non Af Amer: 85 mL/min/{1.73_m2} (ref >59)
Globulin, Total: 1.6 g/dL (ref 1.5–4.5)
Glucose: 145 mg/dL — ABNORMAL HIGH (ref 65–99)
Potassium: 4.9 mmol/L (ref 3.5–5.2)
Sodium: 138 mmol/L (ref 134–144)
Total Protein: 6 g/dL (ref 6.0–8.5)

## 2019-05-01 LAB — CBC WITH DIFFERENTIAL/PLATELET
Basophils Absolute: 0 10*3/uL (ref 0.0–0.2)
Basos: 1 %
EOS (ABSOLUTE): 0.1 10*3/uL (ref 0.0–0.4)
Eos: 2 %
Hematocrit: 39.8 % (ref 34.0–46.6)
Hemoglobin: 13 g/dL (ref 11.1–15.9)
Immature Grans (Abs): 0 10*3/uL (ref 0.0–0.1)
Immature Granulocytes: 0 %
Lymphocytes Absolute: 1.7 10*3/uL (ref 0.7–3.1)
Lymphs: 40 %
MCH: 31.7 pg (ref 26.6–33.0)
MCHC: 32.7 g/dL (ref 31.5–35.7)
MCV: 97 fL (ref 79–97)
Monocytes Absolute: 0.3 10*3/uL (ref 0.1–0.9)
Monocytes: 6 %
Neutrophils Absolute: 2.3 10*3/uL (ref 1.4–7.0)
Neutrophils: 51 %
Platelets: 177 10*3/uL (ref 150–450)
RBC: 4.1 x10E6/uL (ref 3.77–5.28)
RDW: 11.5 % — ABNORMAL LOW (ref 11.7–15.4)
WBC: 4.4 10*3/uL (ref 3.4–10.8)

## 2019-05-01 LAB — LITHIUM LEVEL: Lithium Lvl: 0.5 mmol/L — ABNORMAL LOW (ref 0.6–1.2)

## 2019-05-01 LAB — VALPROIC ACID LEVEL: Valproic Acid Lvl: 72 ug/mL (ref 50–100)

## 2019-05-01 LAB — HEPATIC FUNCTION PANEL: Bilirubin, Direct: 0.09 mg/dL (ref 0.00–0.40)

## 2019-05-02 ENCOUNTER — Encounter (HOSPITAL_COMMUNITY): Payer: Self-pay | Admitting: Psychiatry

## 2019-05-02 ENCOUNTER — Other Ambulatory Visit: Payer: Self-pay

## 2019-05-02 ENCOUNTER — Telehealth (INDEPENDENT_AMBULATORY_CARE_PROVIDER_SITE_OTHER): Payer: BC Managed Care – PPO | Admitting: Psychiatry

## 2019-05-02 DIAGNOSIS — F411 Generalized anxiety disorder: Secondary | ICD-10-CM

## 2019-05-02 DIAGNOSIS — F319 Bipolar disorder, unspecified: Secondary | ICD-10-CM | POA: Diagnosis not present

## 2019-05-02 MED ORDER — LITHIUM CARBONATE ER 300 MG PO TBCR: 600.0000 mg | EXTENDED_RELEASE_TABLET | Freq: Every evening | ORAL | 0 refills | Status: DC

## 2019-05-02 MED ORDER — DIVALPROEX SODIUM ER 500 MG PO TB24: 2000.0000 mg | ORAL_TABLET | Freq: Every day | ORAL | 0 refills | Status: DC

## 2019-05-02 NOTE — BH Specialist Note (Signed)
Virtual Visit via Telephone Note  I connected with Margaret Lopez on 05/02/19 at 10:30 AM EDT by telephone and verified that I am speaking with the correct person using two identifiers.  Location: Patient: home Provider: office   I discussed the limitations, risks, security and privacy concerns of performing an evaluation and management service by telephone and the availability of in person appointments. I also discussed with the patient that there may be a patient responsible charge related to this service. The patient expressed understanding and agreed to proceed.   History of Present Illness: Margaret Lopez feels she doing well. 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 She feels her anxiety level is appropriate to stressors. Sleeping well. She denies manic and hypomanic like symptoms. Margaret Lopez does not feel depressed but more overwhelmed. Margaret Lopez is now able to take the full dose of lithium and depakote.    Observations/Objective:  General Appearance: unable to assess  Eye Contact:  unable to assess  Speech:  Clear and Coherent and Normal Rate  Volume:  Normal  Mood:  Euthymic  Affect:  Constricted  Thought Process:  Goal Directed, Linear and Descriptions of Associations: Intact  Orientation:  Full (Time, Place, and Person)  Thought Content:  Logical  Suicidal Thoughts:  No  Homicidal Thoughts:  No  Memory:  Immediate;   Good  Judgement:  Good  Insight:  Good  Psychomotor Activity: unable to assess  Concentration:  Concentration: Good  Recall:  Good  Fund of Knowledge:  Good  Language:  Good  Akathisia:  unable to assess  Handed:  Right  AIMS (if indicated):     Assets:  Communication Skills Desire for Improvement Financial Resources/Insurance Housing Intimacy Physical Health Resilience Social Support Talents/Skills Transportation Vocational/Educational  ADL's:  unable to assess  Cognition:  WNL  Sleep:         Assessment and Plan: GAD;  Bipolar I d/o  Status of current symptoms: Stable  Continue Depakote ER 2000 mg p.o. nightly  Continue lithium CR 600 mg p.o. q. at bedtime  Xanax 0.5 mg p.o. twice daily as needed anxiety    Reviewed labs done on 04/30/19- Lithium 0.5 and Depakote 72  Follow Up Instructions: In 2-3 months or sooner if needed   I discussed the assessment and treatment plan with the patient. The patient was provided an opportunity to ask questions and all were answered. The patient agreed with the plan and demonstrated an understanding of the instructions.   The patient was advised to call back or seek an in-person evaluation if the symptoms worsen or if the condition fails to improve as anticipated.  I provided 15 minutes of non-face-to-face time during this encounter.   Charlcie Cradle, MD

## 2019-05-07 DIAGNOSIS — J019 Acute sinusitis, unspecified: Secondary | ICD-10-CM | POA: Diagnosis not present

## 2019-05-07 DIAGNOSIS — J3081 Allergic rhinitis due to animal (cat) (dog) hair and dander: Secondary | ICD-10-CM | POA: Diagnosis not present

## 2019-05-07 DIAGNOSIS — J3089 Other allergic rhinitis: Secondary | ICD-10-CM | POA: Diagnosis not present

## 2019-05-07 DIAGNOSIS — J301 Allergic rhinitis due to pollen: Secondary | ICD-10-CM | POA: Diagnosis not present

## 2019-05-08 ENCOUNTER — Encounter: Payer: BC Managed Care – PPO | Attending: Surgery | Admitting: Skilled Nursing Facility1

## 2019-05-08 ENCOUNTER — Other Ambulatory Visit: Payer: Self-pay

## 2019-05-08 DIAGNOSIS — E669 Obesity, unspecified: Secondary | ICD-10-CM | POA: Insufficient documentation

## 2019-05-08 NOTE — Progress Notes (Signed)
Post-Operative RYGB Surgery  Medical Nutrition Therapy:  Appt start time: 3:20 end time:  4:11.  Primary concerns today: Post-operative Bariatric Surgery Nutrition Management.  Non scale victories: more active  Pt states she felt she was gaining weight but sees that was not true. Pt states he has boughts of increased stressed but is okay now. Pt states she does struggle to remember to take her vitamins. Pt states she is working on trying to drink more and slow down when she eats and eat more often throughout the day. Pt states she does pack her lunches which helps when she is buisy. Pt state she is no longer constipated. Pt states he aims to always have something green with her dinner. Pt states she notices if she goes too long without eating she will feel very tired and shaky.   Glucose reading 145  Surgery date: 05/16/2016 Surgery type: RYGB Start weight at [facility]: 227.3 lbs 02/05/2016 Weight today: 145.5 Weight loss goal: less than 150 lbs, learn to eat healthier foods, be more active/present with family  Body Composition Scale 07/10/2018 11/07/2018 05/08/2019  Total Body Fat % 27.3 28.6 28.3  Visceral Fat _0 Fat-Free Mass % 72.6 71.3 71.6   Total Body Water % 50.8 50.1 50.3   Muscle-Mass lbs 29.9 30 30.6  Body Fat Displacement            Torso  lbs 23.4 25.3 25.4         Left Leg  lbs 4.6 5 5.0         Right Leg  lbs 4.6 5 5.0         Left Arm  lbs 2.3 2.5 2.5         Right Arm   lbs 2.3 2.5 2.5   24-hr recall: eating every 3-4 hours B (AM): protein shake  Snk (11AM): banana sandwich or  banana and yogurt Decaf coffee or propel L (PM): ham sandwich Snk (PM): sometimes yogurt or uncrustable  4pm (early dinner): pimento cheese and bagel crisps and cucmbers or hamburgers and mac n cheese and salad or small salad and baked ziti D (PM): ground hamburger with ketchup on hotdog bun with cheese and lettuce and tater tots  Snk (PM): popcorn   Fluid intake: 2 decaff coffee, 1  bottle of propel,  32-40 ounces Estimated total protein intake: 60g  Medications: See list Supplementation: opurity: inconsistant once a day and tums 3 times a day  Using straws: no Drinking while eating: no Having you been chewing well: yes Chewing/swallowing difficulties: no Changes in vision: no Changes to mood/headaches: no Hair loss/Changes to skin/Changes to nails: no Any difficulty focusing or concentrating: no Sweating: no Dizziness/Lightheaded: no Palpitations: no  Carbonated beverages: no N/V/D/C/GAS: no Abdominal Pain: no Dumping syndrome: no  Recent physical activity:  ADL's  Progress Towards Goal(s):  In progress.     Intervention:  Nutrition education and counseling. Goals: -continue to create balanced meals -Work on increasing activity to aim for 150 minutes a week 2-3 days of resistance exercise  -Keep a bar in your bag at all times    Teaching Method Utilized:  Visual Auditory Hands on  Barriers to learning/adherence to lifestyle change: none  Demonstrated degree of understanding via:  Teach Back   Monitoring/Evaluation:  Dietary intake, exercise, and body weight. Return in 6 months.

## 2019-05-16 ENCOUNTER — Telehealth: Payer: Self-pay | Admitting: *Deleted

## 2019-05-16 NOTE — Telephone Encounter (Signed)
Patient is scheduled for Botox on 05/30/2019. I called [employer] [phone] and spoke to Sol D who states that [place] and [phone] are valid and billable.  [id number] will require PA. PA is obtained through Utilization Management [phone].  Ref# for call is [id number].  I called [facility] and spoke to [place].  She states that a PA is already on file.  PA# is [id number] Valid from 03/07/2019-03/06/2020 for 4 visits to be B/B.

## 2019-05-23 ENCOUNTER — Telehealth: Payer: Self-pay | Admitting: *Deleted

## 2019-05-23 MED ORDER — ZEMBRACE SYMTOUCH 3 MG/0.5ML ~~LOC~~ SOAJ: SUBCUTANEOUS | 11 refills | Status: DC

## 2019-05-23 NOTE — Addendum Note (Signed)
Addended by: Gildardo Griffes on: 05/23/2019 04:54 PM   Modules accepted: Orders

## 2019-05-23 NOTE — Telephone Encounter (Signed)
Margaret Lopez, pharmacist with [facility], received a transferred [facility] prescription from [facility]. He is seeking to clarify the instructions for the [facility] which states pt can inject q15 min PRN then repeat in 2 hours. Will clarify with MD then call him back. Return # is [phone].

## 2019-05-23 NOTE — Telephone Encounter (Signed)
I spoke with Nira Conn at [facility] and provided the updated instructions as noted below by Dr. Jaynee Eagles. I also updated the prescription on file in chart so it will be clarified for any future refills.

## 2019-05-23 NOTE — Telephone Encounter (Signed)
Zembrace 3mg  prn q1h, 4 max/day.

## 2019-05-29 NOTE — Progress Notes (Signed)
Consent Form Botulism Toxin Injection For Chronic Migraine  Interval history 05/30/2019: : Baseline 15 headache days a month and 10 migraine days a month. Excellent response, she has > 75% reduction in migraines and headaches monthly. Discussed acute management prescribed frova, zembrace works quickly but the migraines rebound. Did great on Nurtec and we prescribed and she continues to take. Patient felt her forehead and eyebrows were uncomfortable due to inability to move them, reduced injections 1/2 injections in the corrigators and procerus, did the frontal very high and was much better. +10 each masseter, +5 each lateral pterygoid, +5 bilat levator scapulae. +temples.  Dry needling gave her a very bad migraine will not order that again. Aimovig gave her constipation and we stopped due to insurance. Doing so well doesn't even need to consider cgrp at this time   Reviewed orally with patient, additionally signature is on file:  Botulism toxin has been approved by the Federal drug administration for treatment of chronic migraine. Botulism toxin does not cure chronic migraine and it may not be effective in some patients.  The administration of botulism toxin is accomplished by injecting a small amount of toxin into the muscles of the neck and head. Dosage must be titrated for each individual. Any benefits resulting from botulism toxin tend to wear off after 3 months with a repeat injection required if benefit is to be maintained. Injections are usually done every 3-4 months with maximum effect peak achieved by about 2 or 3 weeks. Botulism toxin is expensive and you should be sure of what costs you will incur resulting from the injection.  The side effects of botulism toxin use for chronic migraine may include:   -Transient, and usually mild, facial weakness with facial injections  -Transient, and usually mild, head or neck weakness with head/neck injections  -Reduction or loss of forehead facial  animation due to forehead muscle weakness  -Eyelid drooping  -Dry eye  -Pain at the site of injection or bruising at the site of injection  -Double vision  -Potential unknown long term risks  Contraindications: You should not have Botox if you are pregnant, nursing, allergic to albumin, have an infection, skin condition, or muscle weakness at the site of the injection, or have myasthenia gravis, Lambert-Eaton syndrome, or ALS.  It is also possible that as with any injection, there may be an allergic reaction or no effect from the medication. Reduced effectiveness after repeated injections is sometimes seen and rarely infection at the injection site may occur. All care will be taken to prevent these side effects. If therapy is given over a long time, atrophy and wasting in the muscle injected may occur. Occasionally the patient's become refractory to treatment because they develop antibodies to the toxin. In this event, therapy needs to be modified.  I have read the above information and consent to the administration of botulism toxin.    BOTOX PROCEDURE NOTE FOR MIGRAINE HEADACHE    Contraindications and precautions discussed with patient(above). Aseptic procedure was observed and patient tolerated procedure. Procedure performed by Dr. Georgia Dom  The condition has existed for more than 6 months, and pt does not have a diagnosis of ALS, Myasthenia Gravis or Lambert-Eaton Syndrome.  Risks and benefits of injections discussed and pt agrees to proceed with the procedure.  Written consent obtained  These injections are medically necessary. Pt  receives good benefits from these injections. These injections do not cause sedations or hallucinations which the oral therapies may cause.  Indication/Diagnosis:  chronic migraine BOTOX(J0585) injection was performed according to protocol by [employer]. 200 units of BOTOX was dissolved into 4 cc NS.   NDC: [id number]   Description of  procedure:  The patient was placed in a sitting position. The standard protocol was used for Botox as follows, with 5 units of Botox injected at each site:   -Procerus muscle, midline injection 2.5 units   -Corrugator muscle, bilateral injection 2.5 units each  -Frontalis muscle, bilateral injection, with 2 sites each side, medial injection was performed in the upper one third of the frontalis muscle, in the region vertical from the medial inferior edge of the superior orbital rim. The lateral injection was again in the upper one third of the forehead vertically above the lateral limbus of the cornea, 1.5 cm lateral to the medial injection site.  - Levator Scapulae: 5 units bilaterally   -Temporalis muscle injection, 5 sites, bilaterally. The first injection was 3 cm above the tragus of the ear, second injection site was 1.5 cm to 3 cm up from the first injection site in line with the tragus of the ear. The third injection site was 1.5-3 cm forward between the first 2 injection sites. The fourth injection site was 1.5 cm posterior to the second injection site. 5th site laterally in the temporalis at the level of the outer canthus.  - Patient feels her clenching is a trigger for headaches. +10 units masseter bilaterally and +5 units bilater lateral pterygoids.  -Occipitalis muscle injection, 3 sites, bilaterally. The first injection was done one half way between the occipital protuberance and the tip of the mastoid process behind the ear. The second injection site was done lateral and superior to the first, 1 fingerbreadth from the first injection. The third injection site was 1 fingerbreadth superiorly and medially from the first injection site.  -Cervical paraspinal muscle injection, 2 sites, bilateral knee first injection site was 1 cm from the midline of the cervical spine, 3 cm inferior to the lower border of the occipital protuberance. The second injection site was 1.5 cm superiorly and  laterally to the first injection site.  -Trapezius muscle injection was performed at 3 sites, bilaterally. The first injection site was in the upper trapezius muscle halfway between the inflection point of the neck, and the acromion. The second injection site was one half way between the acromion and the first injection site. The third injection was done between the first injection site and the inflection point of the neck.   Will return for repeat injection in 3 months.   198 unit sof Botox was used, 198 units were injected, the rest of the Botox was wasted. The patient tolerated the procedure well, there were no complications of the above procedure.   I spent 25  minutes of face-to-face and non-face-to-face time with patient on the  No diagnosis found. diagnosis.  This included previsit chart review, lab review, study review, order entry, electronic health record documentation, patient education on the different diagnostic and therapeutic options, counseling and coordination of care, risks and benefits of management, compliance, or risk factor reduction

## 2019-05-30 ENCOUNTER — Other Ambulatory Visit: Payer: Self-pay

## 2019-05-30 ENCOUNTER — Ambulatory Visit (INDEPENDENT_AMBULATORY_CARE_PROVIDER_SITE_OTHER): Payer: BC Managed Care – PPO | Admitting: Neurology

## 2019-05-30 DIAGNOSIS — G43711 Chronic migraine without aura, intractable, with status migrainosus: Secondary | ICD-10-CM

## 2019-05-30 NOTE — Progress Notes (Signed)
Botox- 200 units x 1 vial Lot: [id number] Expiration: 10/2021 NDC: [id number]  Bacteriostatic 0.9% Sodium Chloride- 3mL total Lot: [id number] Expiration: 07/11/2019 NDC: [id number]  Dx: [id number] B/B

## 2019-06-07 DIAGNOSIS — R5383 Other fatigue: Secondary | ICD-10-CM | POA: Diagnosis not present

## 2019-06-07 DIAGNOSIS — R110 Nausea: Secondary | ICD-10-CM | POA: Diagnosis not present

## 2019-06-07 DIAGNOSIS — Z9884 Bariatric surgery status: Secondary | ICD-10-CM | POA: Diagnosis not present

## 2019-06-19 NOTE — Telephone Encounter (Signed)
Courtney @ [facility] has questions re: pt's Zembrace.  Loma Sousa can be reached at [phone]

## 2019-06-20 DIAGNOSIS — Z0000 Encounter for general adult medical examination without abnormal findings: Secondary | ICD-10-CM | POA: Diagnosis not present

## 2019-06-20 DIAGNOSIS — Z1159 Encounter for screening for other viral diseases: Secondary | ICD-10-CM | POA: Diagnosis not present

## 2019-06-20 NOTE — Telephone Encounter (Signed)
I returned the call to [facility] [phone]). I spoke with Heather. She informed me the patient's insurance should start paying for patient's Zembrace now. They are attempting approval. She needed to know how many headache days per month the patient experiences.   Per last note on 05/30/19: Baseline 15 headache days a month and 10 migraine days a month.  This was all the information she needed to complete the request.

## 2019-07-17 ENCOUNTER — Other Ambulatory Visit: Payer: Self-pay | Admitting: Neurology

## 2019-07-25 ENCOUNTER — Encounter (HOSPITAL_COMMUNITY): Payer: Self-pay | Admitting: Psychiatry

## 2019-07-25 ENCOUNTER — Telehealth (INDEPENDENT_AMBULATORY_CARE_PROVIDER_SITE_OTHER): Payer: BC Managed Care – PPO | Admitting: Psychiatry

## 2019-07-25 ENCOUNTER — Other Ambulatory Visit: Payer: Self-pay

## 2019-07-25 DIAGNOSIS — F411 Generalized anxiety disorder: Secondary | ICD-10-CM | POA: Diagnosis not present

## 2019-07-25 DIAGNOSIS — F319 Bipolar disorder, unspecified: Secondary | ICD-10-CM | POA: Diagnosis not present

## 2019-07-25 MED ORDER — LITHIUM CARBONATE ER 300 MG PO TBCR: 900.0000 mg | EXTENDED_RELEASE_TABLET | Freq: Every evening | ORAL | 0 refills | Status: DC

## 2019-07-25 MED ORDER — DIVALPROEX SODIUM ER 500 MG PO TB24: 1500.0000 mg | ORAL_TABLET | Freq: Every day | ORAL | 0 refills | Status: DC

## 2019-07-25 NOTE — Progress Notes (Signed)
Virtual Visit via Telephone Note  I connected with Margaret Lopez on 07/25/19 at  2:00 PM EDT by telephone and verified that I am speaking with the correct person using two identifiers.  Location: Patient: home Provider: office   I discussed the limitations, risks, security and privacy concerns of performing an evaluation and management service by telephone and the availability of in person appointments. I also discussed with the patient that there may be a patient responsible charge related to this service. The patient expressed understanding and agreed to proceed.   History of Present Illness: Margaret Lopez has been very stressed lately. Today she is feeling a little better. Margaret Lopez denies anxiety. She is feeling overwhelmed. She does admit to feeling depressed. Margaret Lopez is not sleeping as well. She is getting enough hours but they are broken. She is unable to focus and complete tasks. Margaret Lopez is noticing mild racing thoughts and is thinking about past mistakes. It is bothering her. She denies manic and hypomanic like symptoms. She denies SI/HI.    Observations/Objective:  General Appearance: unable to assess  Eye Contact:  unable to assess  Speech:  Clear and Coherent and Normal Rate  Volume:  Normal  Mood:  Depressed  Affect:  Congruent  Thought Process:  Goal Directed, Linear and Descriptions of Associations: Intact  Orientation:  Full (Time, Place, and Person)  Thought Content:  Logical  Suicidal Thoughts:  No  Homicidal Thoughts:  No  Memory:  Immediate;   Good  Judgement:  Good  Insight:  Good  Psychomotor Activity: unable to assess  Concentration:  Concentration: Good  Recall:  Good  Fund of Knowledge:  Good  Language:  Good  Akathisia:  unable to assess  Handed:  Right  AIMS (if indicated):     Assets:  Communication Skills Desire for Improvement Financial Resources/Insurance Housing Intimacy Leisure Time Resilience Social  Support Talents/Skills Transportation Vocational/Educational  ADL's:  unable to assess  Cognition:  WNL  Sleep:         Assessment and Plan: GAD; Bipolar I d/o  Depakote ER 1500mg  po qHS  Increase Lithium 900mg  po qHS  Xanax 0.5mg  po BID prn anxiety- no refill today  04/30/19 labs- lithium 0.5; valproic acid 72  Follow Up Instructions: In 4-8 weeks or sooner if needed   I discussed the assessment and treatment plan with the patient. The patient was provided an opportunity to ask questions and all were answered. The patient agreed with the plan and demonstrated an understanding of the instructions.   The patient was advised to call back or seek an in-person evaluation if the symptoms worsen or if the condition fails to improve as anticipated.  I provided 15 minutes of non-face-to-face time during this encounter.   Charlcie Cradle, MD

## 2019-07-30 DIAGNOSIS — J3089 Other allergic rhinitis: Secondary | ICD-10-CM | POA: Diagnosis not present

## 2019-07-30 DIAGNOSIS — H1045 Other chronic allergic conjunctivitis: Secondary | ICD-10-CM | POA: Diagnosis not present

## 2019-07-30 DIAGNOSIS — J301 Allergic rhinitis due to pollen: Secondary | ICD-10-CM | POA: Diagnosis not present

## 2019-07-30 DIAGNOSIS — J3081 Allergic rhinitis due to animal (cat) (dog) hair and dander: Secondary | ICD-10-CM | POA: Diagnosis not present

## 2019-07-30 DIAGNOSIS — J019 Acute sinusitis, unspecified: Secondary | ICD-10-CM | POA: Diagnosis not present

## 2019-08-15 ENCOUNTER — Other Ambulatory Visit: Payer: Self-pay

## 2019-08-15 ENCOUNTER — Encounter (HOSPITAL_COMMUNITY): Payer: Self-pay | Admitting: Psychiatry

## 2019-08-15 ENCOUNTER — Telehealth (INDEPENDENT_AMBULATORY_CARE_PROVIDER_SITE_OTHER): Payer: BC Managed Care – PPO | Admitting: Psychiatry

## 2019-08-15 DIAGNOSIS — F411 Generalized anxiety disorder: Secondary | ICD-10-CM

## 2019-08-15 DIAGNOSIS — F319 Bipolar disorder, unspecified: Secondary | ICD-10-CM

## 2019-08-15 MED ORDER — DIVALPROEX SODIUM ER 500 MG PO TB24: 1500.0000 mg | ORAL_TABLET | Freq: Every day | ORAL | 0 refills | Status: DC

## 2019-08-15 MED ORDER — LITHIUM CARBONATE ER 300 MG PO TBCR: 900.0000 mg | EXTENDED_RELEASE_TABLET | Freq: Every evening | ORAL | 0 refills | Status: DC

## 2019-08-15 NOTE — Progress Notes (Signed)
Virtual Visit via Video Note  I connected with Margaret Lopez on 08/15/19 at  9:00 AM EDT by a video enabled telemedicine application and verified that I am speaking with the correct person using two identifiers.  Location: Patient: home Provider: office   I discussed the limitations of evaluation and management by telemedicine and the availability of in person appointments. The patient expressed understanding and agreed to proceed.  History of Present Illness: Margaret Lopez feels a little better. She is paying more attention to anxiety triggers. She is not as overwhelmed but still struggles with it. Margaret Lopez tries hard to change her thoughts by distracting herself. Margaret Lopez still has 15 tabs of Xanax but tries to avoid taking it. Her depression is improving and she feels overall we are headed in the right direction. A few weeks ago she had a sinus infection and was not able to take all her meds. She is back on track on now. She denies SI/HI. Pt denies recent manic and hypomanic symptoms including periods of decreased need for sleep, increased energy, mood lability, impulsivity, FOI, and excessive spending.    Observations/Objective:  General Appearance: unable to assess  Eye Contact:  unable to assess  Speech:  Clear and Coherent and Normal Rate  Volume:  Normal  Mood:  Depressed  Affect:  Full Range  Thought Process:  Goal Directed, Linear and Descriptions of Associations: Intact  Orientation:  Full (Time, Place, and Person)  Thought Content:  Logical  Suicidal Thoughts:  No  Homicidal Thoughts:  No  Memory:  Immediate;   Good  Judgement:  Good  Insight:  Good  Psychomotor Activity: unable to assess  Concentration:  Concentration: Good  Recall:  Good  Fund of Knowledge:  Good  Language:  Good  Akathisia:  unable to assess  Handed:  Right  AIMS (if indicated):     Assets:  Communication Skills Desire for Improvement Financial Resources/Insurance Housing Resilience Social  Support Talents/Skills Transportation Vocational/Educational  ADL's:  unable to assess  Cognition:  WNL  Sleep:         Assessment and Plan:  GAD; Bipolar I d/o  Depakote ER 1500mg  po qHS  Lithium 900mg  po qHS  Xanax 0.5mg  po BID prn anxiety- no refill today  Continue current meds at these doses- Margaret Lopez has not been able to take prescribed doses consisently since our last visit.   Follow Up Instructions: In 2-3 months or sooner if needed   I discussed the assessment and treatment plan with the patient. The patient was provided an opportunity to ask questions and all were answered. The patient agreed with the plan and demonstrated an understanding of the instructions.   The patient was advised to call back or seek an in-person evaluation if the symptoms worsen or if the condition fails to improve as anticipated.  I provided 15 minutes of non-face-to-face time during this encounter.   Charlcie Cradle, MD

## 2019-08-27 ENCOUNTER — Other Ambulatory Visit: Payer: Self-pay | Admitting: Neurology

## 2019-09-03 ENCOUNTER — Ambulatory Visit: Payer: BC Managed Care – PPO | Admitting: Neurology

## 2019-09-05 ENCOUNTER — Other Ambulatory Visit: Payer: Self-pay

## 2019-09-05 ENCOUNTER — Ambulatory Visit (INDEPENDENT_AMBULATORY_CARE_PROVIDER_SITE_OTHER): Payer: BC Managed Care – PPO | Admitting: Neurology

## 2019-09-05 DIAGNOSIS — G43711 Chronic migraine without aura, intractable, with status migrainosus: Secondary | ICD-10-CM

## 2019-09-05 NOTE — Progress Notes (Signed)
Consent Form Botulism Toxin Injection For Chronic Migraine  Interval history 8/26(no changes still excellent): : Baseline 15 headache days a month and 10 migraine days a month. Excellent response, she has > 75% reduction in migraines and headaches monthly. Discussed acute management prescribed frova, zembrace works quickly but the migraines rebound. Did great on Nurtec and we prescribed and she continues to take. Patient felt her forehead and eyebrows were uncomfortable due to inability to move them, reduced injections 1/2 injections in the corrigators and procerus, did the frontal very high and was much better. +10 each masseter, +5 each lateral pterygoid, +5 bilat levator scapulae. +temples.  Dry needling gave her a very bad migraine will not order that again. Aimovig gave her constipation and we stopped due to insurance. Doing so well doesn't even need to consider cgrp at this time   Reviewed orally with patient, additionally signature is on file:  Botulism toxin has been approved by the Federal drug administration for treatment of chronic migraine. Botulism toxin does not cure chronic migraine and it may not be effective in some patients.  The administration of botulism toxin is accomplished by injecting a small amount of toxin into the muscles of the neck and head. Dosage must be titrated for each individual. Any benefits resulting from botulism toxin tend to wear off after 3 months with a repeat injection required if benefit is to be maintained. Injections are usually done every 3-4 months with maximum effect peak achieved by about 2 or 3 weeks. Botulism toxin is expensive and you should be sure of what costs you will incur resulting from the injection.  The side effects of botulism toxin use for chronic migraine may include:   -Transient, and usually mild, facial weakness with facial injections  -Transient, and usually mild, head or neck weakness with head/neck injections  -Reduction or  loss of forehead facial animation due to forehead muscle weakness  -Eyelid drooping  -Dry eye  -Pain at the site of injection or bruising at the site of injection  -Double vision  -Potential unknown long term risks  Contraindications: You should not have Botox if you are pregnant, nursing, allergic to albumin, have an infection, skin condition, or muscle weakness at the site of the injection, or have myasthenia gravis, Lambert-Eaton syndrome, or ALS.  It is also possible that as with any injection, there may be an allergic reaction or no effect from the medication. Reduced effectiveness after repeated injections is sometimes seen and rarely infection at the injection site may occur. All care will be taken to prevent these side effects. If therapy is given over a long time, atrophy and wasting in the muscle injected may occur. Occasionally the patient's become refractory to treatment because they develop antibodies to the toxin. In this event, therapy needs to be modified.  I have read the above information and consent to the administration of botulism toxin.    BOTOX PROCEDURE NOTE FOR MIGRAINE HEADACHE    Contraindications and precautions discussed with patient(above). Aseptic procedure was observed and patient tolerated procedure. Procedure performed by Dr. Georgia Dom  The condition has existed for more than 6 months, and pt does not have a diagnosis of ALS, Myasthenia Gravis or Lambert-Eaton Syndrome.  Risks and benefits of injections discussed and pt agrees to proceed with the procedure.  Written consent obtained  These injections are medically necessary. Pt  receives good benefits from these injections. These injections do not cause sedations or hallucinations which the oral therapies may  cause.  Indication/Diagnosis: chronic migraine BOTOX(J0585) injection was performed according to protocol by [employer]. 200 units of BOTOX was dissolved into 4 cc NS.   NDC:  [address]-21   Description of procedure:  The patient was placed in a sitting position. The standard protocol was used for Botox as follows, with 5 units of Botox injected at each site:   -Procerus muscle, midline injection 2.5 units   -Corrugator muscle, bilateral injection 2.5 units each  -Frontalis muscle, bilateral injection, with 2 sites each side, medial injection was performed in the upper one third of the frontalis muscle, in the region vertical from the medial inferior edge of the superior orbital rim. The lateral injection was again in the upper one third of the forehead vertically above the lateral limbus of the cornea, 1.5 cm lateral to the medial injection site.  - Levator Scapulae: 5 units bilaterally   -Temporalis muscle injection, 5 sites, bilaterally. The first injection was 3 cm above the tragus of the ear, second injection site was 1.5 cm to 3 cm up from the first injection site in line with the tragus of the ear. The third injection site was 1.5-3 cm forward between the first 2 injection sites. The fourth injection site was 1.5 cm posterior to the second injection site. 5th site laterally in the temporalis at the level of the outer canthus.  - Patient feels her clenching is a trigger for headaches. +10 units masseter bilaterally and +5 units bilater lateral pterygoids.  -Occipitalis muscle injection, 3 sites, bilaterally. The first injection was done one half way between the occipital protuberance and the tip of the mastoid process behind the ear. The second injection site was done lateral and superior to the first, 1 fingerbreadth from the first injection. The third injection site was 1 fingerbreadth superiorly and medially from the first injection site.  -Cervical paraspinal muscle injection, 2 sites, bilateral knee first injection site was 1 cm from the midline of the cervical spine, 3 cm inferior to the lower border of the occipital protuberance. The second injection  site was 1.5 cm superiorly and laterally to the first injection site.  -Trapezius muscle injection was performed at 3 sites, bilaterally. The first injection site was in the upper trapezius muscle halfway between the inflection point of the neck, and the acromion. The second injection site was one half way between the acromion and the first injection site. The third injection was done between the first injection site and the inflection point of the neck.   Will return for repeat injection in 3 months.   198 unit sof Botox was used, 198 units were injected, the rest of the Botox was wasted. The patient tolerated the procedure well, there were no complications of the above procedure.   I spent 25  minutes of face-to-face and non-face-to-face time with patient on the  No diagnosis found. diagnosis.  This included previsit chart review, lab review, study review, order entry, electronic health record documentation, patient education on the different diagnostic and therapeutic options, counseling and coordination of care, risks and benefits of management, compliance, or risk factor reduction

## 2019-09-05 NOTE — Progress Notes (Signed)
Botox- 100 units x 2 vials Lot: [id number] Expiration: 11/2021 NDC: 2091-0681-66  Bacteriostatic 0.9% Sodium Chloride- 85mL total Lot: [id number] Expiration: 10/10/2020 NDC: 9828-6751-98  Dx: Y42.998 B/B

## 2019-10-03 ENCOUNTER — Other Ambulatory Visit: Payer: Self-pay

## 2019-10-03 ENCOUNTER — Telehealth (HOSPITAL_COMMUNITY): Payer: BC Managed Care – PPO | Admitting: Psychiatry

## 2019-10-03 DIAGNOSIS — F319 Bipolar disorder, unspecified: Secondary | ICD-10-CM

## 2019-10-03 DIAGNOSIS — F411 Generalized anxiety disorder: Secondary | ICD-10-CM

## 2019-10-03 DIAGNOSIS — Z5181 Encounter for therapeutic drug level monitoring: Secondary | ICD-10-CM

## 2019-10-03 NOTE — Progress Notes (Unsigned)
3 lithium and 3 depakote daily  Going well- mood stable, mild agitation appropriately related to stressor. Depression no symptoms, mania - no symptoms.   Last week at beach was relaxing and enjoyable  Sleep fair and trying to get back on track    Anxiety stable and manageable  No new allergies   Health ok    Plan- order labs Refill meds Dec 16 @ 8.30a. ([facility] orders)  10 min

## 2019-10-04 MED ORDER — ALPRAZOLAM 0.5 MG PO TABS: 0.5000 mg | ORAL_TABLET | Freq: Two times a day (BID) | ORAL | 0 refills | Status: DC | PRN

## 2019-10-04 MED ORDER — DIVALPROEX SODIUM ER 500 MG PO TB24: 1500.0000 mg | ORAL_TABLET | Freq: Every day | ORAL | 0 refills | Status: DC

## 2019-10-04 MED ORDER — LITHIUM CARBONATE ER 300 MG PO TBCR: 900.0000 mg | EXTENDED_RELEASE_TABLET | Freq: Every evening | ORAL | 0 refills | Status: DC

## 2019-10-15 ENCOUNTER — Other Ambulatory Visit: Payer: Self-pay | Admitting: Neurology

## 2019-11-03 ENCOUNTER — Other Ambulatory Visit: Payer: Self-pay | Admitting: Neurology

## 2019-11-05 ENCOUNTER — Telehealth: Payer: Self-pay | Admitting: *Deleted

## 2019-11-05 NOTE — Telephone Encounter (Signed)
Completed Nurtec renewal PA on Cover My Meds. Key: [id number]. Awaiting determination form [employer] within 72 hours.

## 2019-11-11 NOTE — Telephone Encounter (Signed)
We received an approval from [facility]. Nurtec 75 mg has been approved from 11/05/2019-11/03/2020. I faxed the approval notice to [place]. Received a receipt of confirmation.

## 2019-12-02 MED ORDER — NURTEC 75 MG PO TBDP: ORAL_TABLET | ORAL | 3 refills | Status: DC

## 2019-12-02 NOTE — Addendum Note (Signed)
Addended by: Gildardo Griffes on: 12/02/2019 05:01 PM   Modules accepted: Orders

## 2019-12-10 ENCOUNTER — Ambulatory Visit (INDEPENDENT_AMBULATORY_CARE_PROVIDER_SITE_OTHER): Payer: BC Managed Care – PPO | Admitting: Neurology

## 2019-12-10 DIAGNOSIS — G43711 Chronic migraine without aura, intractable, with status migrainosus: Secondary | ICD-10-CM | POA: Diagnosis not present

## 2019-12-10 NOTE — Progress Notes (Signed)
Consent Form Botulism Toxin Injection For Chronic Migraine  Interval history 12/10/2019 (no changes still excellent): : Baseline 15 headache days a month and 10 migraine days a month. Excellent response, she has > 75% reduction in migraines and headaches monthly. Discussed acute management prescribed frova, zembrace works quickly but the migraines rebound. Did great on Nurtec and we prescribed and she continues to take. Patient felt her forehead and eyebrows were uncomfortable due to inability to move them, reduced injections 1/2 injections in the corrigators and procerus, did the frontal very high and was much better. +10 each masseter, +5 each lateral pterygoid, +5 bilat levator scapulae. +temples.  Dry needling gave her a very bad migraine will not order that again. Aimovig gave her constipation and we stopped due to insurance. Doing so well doesn't even need to consider cgrp at this time   Reviewed orally with patient, additionally signature is on file:  Botulism toxin has been approved by the Federal drug administration for treatment of chronic migraine. Botulism toxin does not cure chronic migraine and it may not be effective in some patients.  The administration of botulism toxin is accomplished by injecting a small amount of toxin into the muscles of the neck and head. Dosage must be titrated for each individual. Any benefits resulting from botulism toxin tend to wear off after 3 months with a repeat injection required if benefit is to be maintained. Injections are usually done every 3-4 months with maximum effect peak achieved by about 2 or 3 weeks. Botulism toxin is expensive and you should be sure of what costs you will incur resulting from the injection.  The side effects of botulism toxin use for chronic migraine may include:   -Transient, and usually mild, facial weakness with facial injections  -Transient, and usually mild, head or neck weakness with head/neck  injections  -Reduction or loss of forehead facial animation due to forehead muscle weakness  -Eyelid drooping  -Dry eye  -Pain at the site of injection or bruising at the site of injection  -Double vision  -Potential unknown long term risks  Contraindications: You should not have Botox if you are pregnant, nursing, allergic to albumin, have an infection, skin condition, or muscle weakness at the site of the injection, or have myasthenia gravis, Lambert-Eaton syndrome, or ALS.  It is also possible that as with any injection, there may be an allergic reaction or no effect from the medication. Reduced effectiveness after repeated injections is sometimes seen and rarely infection at the injection site may occur. All care will be taken to prevent these side effects. If therapy is given over a long time, atrophy and wasting in the muscle injected may occur. Occasionally the patient's become refractory to treatment because they develop antibodies to the toxin. In this event, therapy needs to be modified.  I have read the above information and consent to the administration of botulism toxin.    BOTOX PROCEDURE NOTE FOR MIGRAINE HEADACHE    Contraindications and precautions discussed with patient(above). Aseptic procedure was observed and patient tolerated procedure. Procedure performed by Dr. Georgia Dom  The condition has existed for more than 6 months, and pt does not have a diagnosis of ALS, Myasthenia Gravis or Lambert-Eaton Syndrome.  Risks and benefits of injections discussed and pt agrees to proceed with the procedure.  Written consent obtained  These injections are medically necessary. Pt  receives good benefits from these injections. These injections do not cause sedations or hallucinations which the oral therapies  may cause.  Indication/Diagnosis: chronic migraine BOTOX(J0585) injection was performed according to protocol by [employer]. 200 units of BOTOX was dissolved into 4 cc NS.   NDC:  [address]-76   Description of procedure:  The patient was placed in a sitting position. The standard protocol was used for Botox as follows, with 5 units of Botox injected at each site:   -Procerus muscle, midline injection 2.5 units   -Corrugator muscle, bilateral injection 2.5 units each  -Frontalis muscle, bilateral injection, with 2 sites each side, medial injection was performed in the upper one third of the frontalis muscle, in the region vertical from the medial inferior edge of the superior orbital rim. The lateral injection was again in the upper one third of the forehead vertically above the lateral limbus of the cornea, 1.5 cm lateral to the medial injection site.  - Levator Scapulae: 5 units bilaterally   -Temporalis muscle injection, 5 sites, bilaterally. The first injection was 3 cm above the tragus of the ear, second injection site was 1.5 cm to 3 cm up from the first injection site in line with the tragus of the ear. The third injection site was 1.5-3 cm forward between the first 2 injection sites. The fourth injection site was 1.5 cm posterior to the second injection site. 5th site laterally in the temporalis at the level of the outer canthus.  - Patient feels her clenching is a trigger for headaches. +10 units masseter bilaterally and +5 units bilater lateral pterygoids.  -Occipitalis muscle injection, 3 sites, bilaterally. The first injection was done one half way between the occipital protuberance and the tip of the mastoid process behind the ear. The second injection site was done lateral and superior to the first, 1 fingerbreadth from the first injection. The third injection site was 1 fingerbreadth superiorly and medially from the first injection site.  -Cervical paraspinal muscle injection, 2 sites, bilateral knee first injection site was 1 cm from the midline of the cervical spine, 3 cm inferior to the lower border of the occipital protuberance. The second injection  site was 1.5 cm superiorly and laterally to the first injection site.  -Trapezius muscle injection was performed at 3 sites, bilaterally. The first injection site was in the upper trapezius muscle halfway between the inflection point of the neck, and the acromion. The second injection site was one half way between the acromion and the first injection site. The third injection was done between the first injection site and the inflection point of the neck.   Will return for repeat injection in 3 months.   198 unit sof Botox was used, 198 units were injected, the rest of the Botox was wasted. The patient tolerated the procedure well, there were no complications of the above procedure.   I spent 25  minutes of face-to-face and non-face-to-face time with patient on the  No diagnosis found. diagnosis.  This included previsit chart review, lab review, study review, order entry, electronic health record documentation, patient education on the different diagnostic and therapeutic options, counseling and coordination of care, risks and benefits of management, compliance, or risk factor reduction

## 2019-12-10 NOTE — Progress Notes (Signed)
Botox- 200 units x 1 vials Lot: [id number] Expiration: 06/2022 NDC: 2952-8413-24  Bacteriostatic 0.9% Sodium Chloride- 22mL total Lot: [id number] Expiration: 02/10/2021 NDC: 2536-6440-34  Dx: V42.595 B/B

## 2019-12-12 ENCOUNTER — Encounter: Payer: BC Managed Care – PPO | Attending: Surgery | Admitting: Skilled Nursing Facility1

## 2019-12-12 ENCOUNTER — Other Ambulatory Visit: Payer: Self-pay

## 2019-12-12 ENCOUNTER — Encounter: Payer: Self-pay | Admitting: Skilled Nursing Facility1

## 2019-12-12 DIAGNOSIS — Z9884 Bariatric surgery status: Secondary | ICD-10-CM | POA: Diagnosis not present

## 2019-12-12 DIAGNOSIS — E669 Obesity, unspecified: Secondary | ICD-10-CM | POA: Diagnosis not present

## 2019-12-12 DIAGNOSIS — Z5181 Encounter for therapeutic drug level monitoring: Secondary | ICD-10-CM | POA: Diagnosis not present

## 2019-12-12 NOTE — Progress Notes (Signed)
Post-Operative RYGB Surgery  Medical Nutrition Therapy:  Appt start time: 3:20 end time:  4:11.  Primary concerns today: Post-operative Bariatric Surgery Nutrition Management.  Non scale victories: more active    Pt states she knows 10-15g sugar makes her sick. 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 Pt states she sees where milkshakes do NOT make her sick. Pt states she has been constipated not having gone for about 5-6 days causing nausea. Pt states she sees where not drinking enough will cause constipation. Pt states she gets non hunger eating with stress at work. 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    Surgery date: 05/16/2016 Surgery type: RYGB Start weight at [facility]: 227.3 lbs 02/05/2016 Weight today: 155 Weight loss goal: less than 150 lbs, learn to eat healthier foods, be more active/present with family  Body Composition Scale 07/10/2018 11/07/2018 05/08/2019 12/12/2019  Total Body Fat % 27.3 28.6 28.3 31.8  Visceral Fat 6 6 6 8   Fat-Free Mass % 72.6 71.3 71.6 68.1   Total Body Water % 50.8 50.1 50.3 48.5   Muscle-Mass lbs 29.9 30 30.6 30  Body Fat Displacement             Torso  lbs 23.4 25.3 25.4 30.4         Left Leg  lbs 4.6 5 5.0 6         Right Leg  lbs 4.6 5 5.0 6         Left Arm  lbs 2.3 2.5 2.5 3         Right Arm   lbs 2.3 2.5 2.5 3   24-hr recall: eating every 3-4 hours B (AM): protein shake hashbrowns + 2 bacon or sausage + 1 egg (+milk) + toast  Snk (11AM):  L (PM): meatloaf  + potatoes (+oil) Snk (PM): cucumbers Snack: chips D (6:15 PM): homemade pizza with ham and cheese Snk (PM): popcorn or sugar free pudding + cool whip Snack after having gone to sleep: pudding or popcorn or chips or croutons   Fluid intake: 2 decaff coffee (non dairy creamer + sweet n  low), 1 bottle of propel or water,  32-40 ounces Estimated total protein intake: 60g  Medications: See list Supplementation: no multi and taking calcium  Using straws: no Drinking while eating: no Having you been chewing well: yes Chewing/swallowing difficulties: no Changes in vision: no Changes to mood/headaches: no Hair loss/Changes to skin/Changes to nails: no Any difficulty focusing or concentrating: no Sweating: no Dizziness/Lightheaded: no Palpitations: no  Carbonated beverages: no N/V/D/C/GAS: no Abdominal Pain: no Dumping syndrome: no  Recent physical activity:  ADL's  Progress Towards Goal(s):  In progress.     Intervention:  Nutrition education and counseling. Goals: -Measure out your creamer for each cup of coffee -Pack your lunch for your work meals and snacks -Include non starchy vegetables with every lunch and every dinner -Take the time you need during the work week to be outside    Teaching Method Utilized:  Visual Auditory Hands on  Barriers to learning/adherence to lifestyle change: none  Demonstrated degree of understanding via:  Teach Back   Monitoring/Evaluation:  Dietary intake, exercise, and body weight. Return in 1 month.

## 2019-12-13 LAB — LITHIUM LEVEL: Lithium Lvl: 1.2 mmol/L (ref 0.6–1.2)

## 2019-12-13 LAB — VALPROIC ACID LEVEL: Valproic Acid Lvl: 88 mg/L (ref 50.0–100.0)

## 2019-12-25 DIAGNOSIS — Z01419 Encounter for gynecological examination (general) (routine) without abnormal findings: Secondary | ICD-10-CM | POA: Diagnosis not present

## 2019-12-25 DIAGNOSIS — Z1231 Encounter for screening mammogram for malignant neoplasm of breast: Secondary | ICD-10-CM | POA: Diagnosis not present

## 2019-12-25 DIAGNOSIS — Z1272 Encounter for screening for malignant neoplasm of vagina: Secondary | ICD-10-CM | POA: Diagnosis not present

## 2019-12-26 ENCOUNTER — Other Ambulatory Visit: Payer: Self-pay

## 2019-12-26 ENCOUNTER — Telehealth (HOSPITAL_COMMUNITY): Payer: BC Managed Care – PPO | Admitting: Psychiatry

## 2019-12-26 DIAGNOSIS — F411 Generalized anxiety disorder: Secondary | ICD-10-CM

## 2019-12-26 DIAGNOSIS — F319 Bipolar disorder, unspecified: Secondary | ICD-10-CM

## 2019-12-26 MED ORDER — DIVALPROEX SODIUM ER 500 MG PO TB24: 1500.0000 mg | ORAL_TABLET | Freq: Every day | ORAL | 0 refills | Status: DC

## 2019-12-26 MED ORDER — ALPRAZOLAM 0.5 MG PO TABS: 0.5000 mg | ORAL_TABLET | Freq: Two times a day (BID) | ORAL | 0 refills | Status: DC | PRN

## 2019-12-26 MED ORDER — LITHIUM CARBONATE ER 300 MG PO TBCR: 600.0000 mg | EXTENDED_RELEASE_TABLET | Freq: Every evening | ORAL | 0 refills | Status: DC

## 2019-12-26 NOTE — Progress Notes (Unsigned)
Virtual Visit via Telephone Note  I connected with JULIO STORR on 12/26/19 at  8:30 AM EST by telephone and verified that I am speaking with the correct person using two identifiers.  Location: Patient: home Provider: office   I discussed the limitations, risks, security and privacy concerns of performing an evaluation and management service by telephone and the availability of in person appointments. I also discussed with the patient that there may be a patient responsible charge related to this service. The patient expressed understanding and agreed to proceed.   History of Present Illness: Monica states she is stressed but it does not feel like it is concerning. She has a lot of stressors. It makes her a little more irritable and anxious. Deandrea is eating more as well. She has gained 20 lbs back and is starting back with the dietician.  Despite the increased anxiety she has only taken Xanax a few times over the last month. Her sleep is good. Her depression is stable. She denies SI/HI. Kashlynn denies manic and hypomanic like symptoms. Her biggest concern at this time is random tremors. Jaycelynn had Lithium level and Depakote levels done. Her Lithium is 1.2 and Depakote 88.    Observations/Objective:  General Appearance: unable to assess  Eye Contact:  unable to assess  Speech:  {Speech:22685}  Volume:  {Volume (PAA):22686}  Mood:  {BHH MOOD:22306}  Affect:  {Affect (PAA):22687}  Thought Process:  {Thought Process (PAA):22688}  Orientation:  {BHH ORIENTATION (PAA):22689}  Thought Content:  {Thought Content:22690}  Suicidal Thoughts:  {ST/HT (PAA):22692}  Homicidal Thoughts:  {ST/HT (PAA):22692}  Memory:  {BHH MEMORY:22881}  Judgement:  {Judgement (PAA):22694}  Insight:  {Insight (PAA):22695}  Psychomotor Activity: unable to assess  Concentration:  {Concentration:21399}  Recall:  {BHH GOOD/FAIR/POOR:22877}  Fund of Knowledge:  {BHH GOOD/FAIR/POOR:22877}  Language:  {BHH  GOOD/FAIR/POOR:22877}  Akathisia:  unable to assess  Handed:  {Handed:22697}  AIMS (if indicated):     Assets:  {Assets (PAA):22698}  ADL's:  unable to assess  Cognition:  {chl bhh cognition:304700322}  Sleep:         Assessment and Plan:  Bipolar 1 disorder, depressed (HCC)  Generalized anxiety disorder    Decrease Lithobid to 600mg  po qD Continue Depakote 1500mg  po qD Continue Xanax 0.5mg  po BID prn anxiety  Follow Up Instructions: In 2 months or sooner if needed.   I discussed the assessment and treatment plan with the patient. The patient was provided an opportunity to ask questions and all were answered. The patient agreed with the plan and demonstrated an understanding of the instructions.   The patient was advised to call back or seek an in-person evaluation if the symptoms worsen or if the condition fails to improve as anticipated.  I provided 15 minutes of non-face-to-face time during this encounter.   Charlcie Cradle, MD

## 2019-12-30 DIAGNOSIS — H5711 Ocular pain, right eye: Secondary | ICD-10-CM | POA: Diagnosis not present

## 2019-12-31 DIAGNOSIS — H1031 Unspecified acute conjunctivitis, right eye: Secondary | ICD-10-CM | POA: Diagnosis not present

## 2020-01-01 ENCOUNTER — Other Ambulatory Visit: Payer: Self-pay | Admitting: Obstetrics and Gynecology

## 2020-01-01 DIAGNOSIS — R928 Other abnormal and inconclusive findings on diagnostic imaging of breast: Secondary | ICD-10-CM

## 2020-01-13 ENCOUNTER — Telehealth: Payer: Self-pay | Admitting: Skilled Nursing Facility1

## 2020-01-13 NOTE — Telephone Encounter (Signed)
Pt called looking for other protein shakes options.    Dietitian answered pts questions.

## 2020-01-16 ENCOUNTER — Ambulatory Visit
Admission: RE | Admit: 2020-01-16 | Discharge: 2020-01-16 | Disposition: A | Discharge status: Home / Self Care | Payer: BC Managed Care – PPO | Source: Ambulatory Visit | Attending: Obstetrics and Gynecology | Admitting: Obstetrics and Gynecology

## 2020-01-16 ENCOUNTER — Other Ambulatory Visit: Payer: Self-pay

## 2020-01-16 ENCOUNTER — Ambulatory Visit: Payer: BC Managed Care – PPO | Admitting: Skilled Nursing Facility1

## 2020-01-16 DIAGNOSIS — N6321 Unspecified lump in the left breast, upper outer quadrant: Secondary | ICD-10-CM | POA: Diagnosis not present

## 2020-01-16 DIAGNOSIS — R928 Other abnormal and inconclusive findings on diagnostic imaging of breast: Secondary | ICD-10-CM

## 2020-01-16 DIAGNOSIS — R922 Inconclusive mammogram: Secondary | ICD-10-CM | POA: Diagnosis not present

## 2020-02-06 ENCOUNTER — Encounter: Payer: BC Managed Care – PPO | Attending: Surgery | Admitting: Skilled Nursing Facility1

## 2020-02-06 ENCOUNTER — Other Ambulatory Visit: Payer: Self-pay

## 2020-02-06 DIAGNOSIS — E669 Obesity, unspecified: Secondary | ICD-10-CM | POA: Diagnosis not present

## 2020-02-06 NOTE — Progress Notes (Signed)
Post-Operative RYGB Surgery  Primary concerns today: Post-operative Bariatric Surgery Nutrition Management.  Non scale victories: more active  Pt states she was wanting to work with somebody to lose weight: Dietitian advised her focus is not to lose weight but to feel better ,entally emotionally. Pt admits her weight gain is from emotional eating/stress eating.  Pt states she is very stressed because she feels she needs to be doing more about her weight and more with her children and more house work and better work at her job. Pt states she felt she was meeting expectations at work but now does not feel that way anymore because they reassigned her. Pt states she is scared to take her break through mental health medication stating she does not want to have to rely on it: dietitian helped pt talk through her ideas on not wanting to take her break through medication advising she speak with her doctor further to help assuage her negative feelings on the matter.   Surgery date: 05/16/2016 Surgery type: RYGB Start weight at [facility]: 227.3 lbs 02/05/2016 Weight today: 161.6 Weight loss goal: less than 150 lbs, learn to eat healthier foods, be more active/present with family  Body Composition Scale 07/10/2018 11/07/2018 05/08/2019 12/12/2019 02/06/2020  Total Body Fat % 27.3 28.6 28.3 31.8 33.2  Visceral Fat 6 6 6 8 8   Fat-Free Mass % 72.6 71.3 71.6 68.1 66.7   Total Body Water % 50.8 50.1 50.3 48.5 47.8   Muscle-Mass lbs 29.9 30 30.6 30 30   Body Fat Displacement              Torso  lbs 23.4 25.3 25.4 30.4 33.1         Left Leg  lbs 4.6 5 5.0 6 6.6         Right Leg  lbs 4.6 5 5.0 6 6.6         Left Arm  lbs 2.3 2.5 2.5 3 3.3         Right Arm   lbs 2.3 2.5 2.5 3 3.3   24-hr recall: eating every 3-4 hours B (AM): protein shake hashbrowns + 2 bacon or sausage + 1 egg (+milk) + toast  Snk (11AM):  L (PM): meatloaf  + potatoes (+oil) Snk (PM): cucumbers Snack: chips D (6:15 PM): homemade pizza  with ham and cheese Snk (PM): popcorn or sugar free pudding + cool whip Snack after having gone to sleep: pudding or popcorn or chips or croutons   Fluid intake: 2 decaff coffee (non dairy creamer + sweet n low), 1 bottle of propel or water,  32-40 ounces Estimated total protein intake: 60g  Medications: See list Supplementation: no multi and taking calcium  Using straws: no Drinking while eating: no Having you been chewing well: yes Chewing/swallowing difficulties: no Changes in vision: no Changes to mood/headaches: no Hair loss/Changes to skin/Changes to nails: no Any difficulty focusing or concentrating: no Sweating: no Dizziness/Lightheaded: no Palpitations: no  Carbonated beverages: no N/V/D/C/GAS: no Abdominal Pain: no Dumping syndrome: no  Recent physical activity:  ADL's  Progress Towards Goal(s):  In progress.     Intervention:  Nutrition education and counseling. Goals:  -Ask your physchiatrist for a referral to a mental health talk therapist  -talk to your physchiatrist about your break through medication  -Make an appt with [facility] after you start talk therapy   Teaching Method Utilized:  Visual Auditory Hands on  Barriers to learning/adherence to lifestyle change: none  Demonstrated degree of  understanding via:  Teach Back   Monitoring/Evaluation:  Dietary intake, exercise, and body weight.

## 2020-02-11 ENCOUNTER — Telehealth: Payer: Self-pay | Admitting: Neurology

## 2020-02-11 NOTE — Telephone Encounter (Signed)
Patient has a Botox appointment 3/1. Her Botox PA with [employer] will expire 2/25. I filled out PA form and MD signed. Will fax to [facility] with notes.

## 2020-02-12 NOTE — Telephone Encounter (Signed)
I called the patient. She is agreeable to enrolling in [place] for the Botox. I will fax in her prescription once we get [employer] approval. I asked the patient if she is enrolled with the Botox Savings Program. Patient states she does not know that she is. I will work on enrolling her.

## 2020-02-13 ENCOUNTER — Other Ambulatory Visit: Payer: Self-pay

## 2020-02-13 ENCOUNTER — Telehealth (INDEPENDENT_AMBULATORY_CARE_PROVIDER_SITE_OTHER): Payer: BC Managed Care – PPO | Admitting: Psychiatry

## 2020-02-13 DIAGNOSIS — F319 Bipolar disorder, unspecified: Secondary | ICD-10-CM | POA: Diagnosis not present

## 2020-02-13 DIAGNOSIS — F411 Generalized anxiety disorder: Secondary | ICD-10-CM

## 2020-02-13 MED ORDER — DIVALPROEX SODIUM ER 500 MG PO TB24: 1500.0000 mg | ORAL_TABLET | Freq: Every day | ORAL | 0 refills | Status: DC

## 2020-02-13 MED ORDER — ALPRAZOLAM 0.5 MG PO TABS
0.5000 mg | ORAL_TABLET | Freq: Two times a day (BID) | ORAL | 2 refills | Status: DC | PRN
Start: 2020-02-13 — End: 2020-03-26

## 2020-02-13 MED ORDER — BUPROPION HCL ER (XL) 150 MG PO TB24: 150.0000 mg | ORAL_TABLET | ORAL | 2 refills | Status: DC

## 2020-02-13 MED ORDER — LITHIUM CARBONATE ER 300 MG PO TBCR: 600.0000 mg | EXTENDED_RELEASE_TABLET | Freq: Every evening | ORAL | 0 refills | Status: DC

## 2020-02-13 NOTE — Telephone Encounter (Signed)
Received approval from [facility]. PA #[id number] (02/12/20- 02/10/21). Faxed Prescription to [place] with Botox savings program co-pay card, insurance cards and [employer].

## 2020-02-13 NOTE — Progress Notes (Signed)
Virtual Visit via Telephone Note  I connected with Margaret Lopez on 02/13/20 at 10:00 AM EST by telephone and verified that I am speaking with the correct person using two identifiers.  Location: Patient: home Provider: office   I discussed the limitations, risks, security and privacy concerns of performing an evaluation and management service by telephone and the availability of in person appointments. I also discussed with the patient that there may be a patient responsible charge related to this service. The patient expressed understanding and agreed to proceed.   History of Present Illness: Margaret Lopez states she has been trying to start counseling but has been unsuccessful. She contacted 2 therapist in the office who declined for unknown reasons. Margaret Lopez is noting stress eating and she has gained almost 30 lbs in about 6 months.  She was released by her dietician she weighed 135 lbs. Due to her severe diet restrictions she is surprised by how much weight gain. Her tremors have resolved since the Lithium dose was decreased. She is having a lot anxiety. She does not want to take Xanax often because she doesn't want to be sleepy or get addicted. Margaret Lopez does admit to some depression. She is reporting a lot of negative self thoughts and isolation. Her motivation and energy are low. She denies SI/HI. Margaret Lopez denies any manic or hypomanic like episodes.    Observations/Objective:  General Appearance: unable to assess  Eye Contact:  unable to assess  Speech:  Clear and Coherent and Normal Rate  Volume:  Normal  Mood:  Anxious and Depressed  Affect:  Congruent  Thought Process:  Goal Directed, Linear and Descriptions of Associations: Intact  Orientation:  Full (Time, Place, and Person)  Thought Content:  Logical  Suicidal Thoughts:  No  Homicidal Thoughts:  No  Memory:  Immediate;   Good  Judgement:  Good  Insight:  Good  Psychomotor Activity: unable to assess  Concentration:  Concentration: Good   Recall:  Good  Fund of Knowledge:  Good  Language:  Good  Akathisia:  unable to assess  Handed:  Right  AIMS (if indicated):     Assets:  Communication Skills Desire for Improvement Financial Resources/Insurance Housing Intimacy Resilience Social Support Talents/Skills Transportation Vocational/Educational  ADL's:  unable to assess  Cognition:  WNL  Sleep:         Assessment and Plan: 1. Bipolar 1 disorder, depressed (HCC) - ALPRAZolam (XANAX) 0.5 MG tablet; Take 1 tablet (0.5 mg total) by mouth 2 (two) times daily as needed for anxiety.  Dispense: 60 tablet; Refill: 2 - divalproex (DEPAKOTE ER) 500 MG 24 hr tablet; Take 3 tablets (1,500 mg total) by mouth daily.  Dispense: 270 tablet; Refill: 0 - lithium carbonate (LITHOBID) 300 MG CR tablet; Take 2 tablets (600 mg total) by mouth every evening.  Dispense: 180 tablet; Refill: 0 - Start  buPROPion (WELLBUTRIN XL) 150 MG 24 hr tablet; Take 1 tablet (150 mg total) by mouth every morning for mood, anxiety and appetite control  2. Generalized anxiety disorder - ALPRAZolam (XANAX) 0.5 MG tablet; Take 1 tablet (0.5 mg total) by mouth 2 (two) times daily as needed for anxiety.  Dispense: 60 tablet; Refill: 2 - buPROPion (WELLBUTRIN XL) 150 MG 24 hr tablet; Take 1 tablet (150 mg total) by mouth every morning.  Dispense: 30 tablet; Refill: 2  - encouraged patient to seek out therapist thru her insurance company  Follow Up Instructions: In 1-2 months or sooner if needed   I discussed  the assessment and treatment plan with the patient. The patient was provided an opportunity to ask questions and all were answered. The patient agreed with the plan and demonstrated an understanding of the instructions.   The patient was advised to call back or seek an in-person evaluation if the symptoms worsen or if the condition fails to improve as anticipated.  I provided 21 minutes of non-face-to-face time during this encounter.   Charlcie Cradle, MD

## 2020-02-18 NOTE — Telephone Encounter (Signed)
I called [facility] and spoke with Belenda Cruise to check the status. Belenda Cruise states Botox TBD 2/23.

## 2020-03-03 DIAGNOSIS — G43711 Chronic migraine without aura, intractable, with status migrainosus: Secondary | ICD-10-CM | POA: Diagnosis not present

## 2020-03-04 NOTE — Telephone Encounter (Signed)
(  1) 200U vial of Botox delivered today from [employer].

## 2020-03-10 ENCOUNTER — Ambulatory Visit: Payer: BC Managed Care – PPO | Admitting: Neurology

## 2020-03-10 ENCOUNTER — Ambulatory Visit (INDEPENDENT_AMBULATORY_CARE_PROVIDER_SITE_OTHER): Payer: BC Managed Care – PPO | Admitting: Neurology

## 2020-03-10 DIAGNOSIS — G43711 Chronic migraine without aura, intractable, with status migrainosus: Secondary | ICD-10-CM | POA: Diagnosis not present

## 2020-03-10 MED ORDER — CYCLOBENZAPRINE HCL 10 MG PO TABS: ORAL_TABLET | ORAL | 4 refills | Status: DC

## 2020-03-10 MED ORDER — NURTEC 75 MG PO TBDP: ORAL_TABLET | ORAL | 11 refills | Status: DC

## 2020-03-10 NOTE — Progress Notes (Signed)
Botox- 200 units x 1 vial Lot: [id number] Expiration: 10/2022 NDC: 6190-1222-41  Bacteriostatic 0.9% Sodium Chloride- 60mL total Lot: [id number] Expiration: 02/10/2021 NDC: 4276-7011-00  Dx: P49.611 S/P

## 2020-03-10 NOTE — Progress Notes (Signed)
Consent Form Botulism Toxin Injection For Chronic Migraine   03/10/2020:no changes, stable, doing excellent  Interval history 12/10/2019 (no changes still excellent): : Baseline 15 headache days a month and 10 migraine days a month. Excellent response, she has > 75% reduction in migraines and headaches monthly. Discussed acute management prescribed frova, zembrace works quickly but the migraines rebound. Did great on Nurtec and we prescribed and she continues to take. Patient felt her forehead and eyebrows were uncomfortable due to inability to move them, reduced injections 1/2 injections in the corrigators and procerus, did the frontal very high and was much better. +10 each masseter, +5 each lateral pterygoid, +5 bilat levator scapulae. +temples.  Dry needling gave her a very bad migraine will not order that again. Aimovig gave her constipation and we stopped due to insurance. Doing so well doesn't even need to consider cgrp at this time   Reviewed orally with patient, additionally signature is on file:  Botulism toxin has been approved by the Federal drug administration for treatment of chronic migraine. Botulism toxin does not cure chronic migraine and it may not be effective in some patients.  The administration of botulism toxin is accomplished by injecting a small amount of toxin into the muscles of the neck and head. Dosage must be titrated for each individual. Any benefits resulting from botulism toxin tend to wear off after 3 months with a repeat injection required if benefit is to be maintained. Injections are usually done every 3-4 months with maximum effect peak achieved by about 2 or 3 weeks. Botulism toxin is expensive and you should be sure of what costs you will incur resulting from the injection.  The side effects of botulism toxin use for chronic migraine may include:   -Transient, and usually mild, facial weakness with facial injections  -Transient, and usually mild, head or  neck weakness with head/neck injections  -Reduction or loss of forehead facial animation due to forehead muscle weakness  -Eyelid drooping  -Dry eye  -Pain at the site of injection or bruising at the site of injection  -Double vision  -Potential unknown long term risks  Contraindications: You should not have Botox if you are pregnant, nursing, allergic to albumin, have an infection, skin condition, or muscle weakness at the site of the injection, or have myasthenia gravis, Lambert-Eaton syndrome, or ALS.  It is also possible that as with any injection, there may be an allergic reaction or no effect from the medication. Reduced effectiveness after repeated injections is sometimes seen and rarely infection at the injection site may occur. All care will be taken to prevent these side effects. If therapy is given over a long time, atrophy and wasting in the muscle injected may occur. Occasionally the patient's become refractory to treatment because they develop antibodies to the toxin. In this event, therapy needs to be modified.  I have read the above information and consent to the administration of botulism toxin.    BOTOX PROCEDURE NOTE FOR MIGRAINE HEADACHE    Contraindications and precautions discussed with patient(above). Aseptic procedure was observed and patient tolerated procedure. Procedure performed by Dr. Georgia Dom  The condition has existed for more than 6 months, and pt does not have a diagnosis of ALS, Myasthenia Gravis or Lambert-Eaton Syndrome.  Risks and benefits of injections discussed and pt agrees to proceed with the procedure.  Written consent obtained  These injections are medically necessary. Pt  receives good benefits from these injections. These injections do not cause  sedations or hallucinations which the oral therapies may cause.  Indication/Diagnosis: chronic migraine BOTOX(J0585) injection was performed according to protocol by [employer]. 200 units of BOTOX was  dissolved into 4 cc NS.   NDC: [address]-81   Description of procedure:  The patient was placed in a sitting position. The standard protocol was used for Botox as follows, with 5 units of Botox injected at each site:   -Procerus muscle, midline injection 2.5 units   -Corrugator muscle, bilateral injection 2.5 units each  -Frontalis muscle, bilateral injection, with 2 sites each side, medial injection was performed in the upper one third of the frontalis muscle, in the region vertical from the medial inferior edge of the superior orbital rim. The lateral injection was again in the upper one third of the forehead vertically above the lateral limbus of the cornea, 1.5 cm lateral to the medial injection site.  - Levator Scapulae: 5 units bilaterally   -Temporalis muscle injection, 5 sites, bilaterally. The first injection was 3 cm above the tragus of the ear, second injection site was 1.5 cm to 3 cm up from the first injection site in line with the tragus of the ear. The third injection site was 1.5-3 cm forward between the first 2 injection sites. The fourth injection site was 1.5 cm posterior to the second injection site. 5th site laterally in the temporalis at the level of the outer canthus.  - Patient feels her clenching is a trigger for headaches. +10 units masseter bilaterally and +5 units bilater lateral pterygoids.  -Occipitalis muscle injection, 3 sites, bilaterally. The first injection was done one half way between the occipital protuberance and the tip of the mastoid process behind the ear. The second injection site was done lateral and superior to the first, 1 fingerbreadth from the first injection. The third injection site was 1 fingerbreadth superiorly and medially from the first injection site.  -Cervical paraspinal muscle injection, 2 sites, bilateral knee first injection site was 1 cm from the midline of the cervical spine, 3 cm inferior to the lower border of the occipital  protuberance. The second injection site was 1.5 cm superiorly and laterally to the first injection site.  -Trapezius muscle injection was performed at 3 sites, bilaterally. The first injection site was in the upper trapezius muscle halfway between the inflection point of the neck, and the acromion. The second injection site was one half way between the acromion and the first injection site. The third injection was done between the first injection site and the inflection point of the neck.   Will return for repeat injection in 3 months.   198 unit sof Botox was used, 198 units were injected, the rest of the Botox was wasted. The patient tolerated the procedure well, there were no complications of the above procedure.   I spent 25  minutes of face-to-face and non-face-to-face time with patient on the  No diagnosis found. diagnosis.  This included previsit chart review, lab review, study review, order entry, electronic health record documentation, patient education on the different diagnostic and therapeutic options, counseling and coordination of care, risks and benefits of management, compliance, or risk factor reduction

## 2020-03-11 ENCOUNTER — Other Ambulatory Visit (HOSPITAL_COMMUNITY): Payer: Self-pay | Admitting: Psychiatry

## 2020-03-11 DIAGNOSIS — F411 Generalized anxiety disorder: Secondary | ICD-10-CM

## 2020-03-11 DIAGNOSIS — F319 Bipolar disorder, unspecified: Secondary | ICD-10-CM

## 2020-03-26 ENCOUNTER — Telehealth (INDEPENDENT_AMBULATORY_CARE_PROVIDER_SITE_OTHER): Payer: BC Managed Care – PPO | Admitting: Psychiatry

## 2020-03-26 ENCOUNTER — Other Ambulatory Visit: Payer: Self-pay

## 2020-03-26 ENCOUNTER — Encounter (HOSPITAL_COMMUNITY): Payer: Self-pay | Admitting: Psychiatry

## 2020-03-26 DIAGNOSIS — F411 Generalized anxiety disorder: Secondary | ICD-10-CM | POA: Diagnosis not present

## 2020-03-26 DIAGNOSIS — F319 Bipolar disorder, unspecified: Secondary | ICD-10-CM | POA: Diagnosis not present

## 2020-03-26 MED ORDER — DIVALPROEX SODIUM ER 500 MG PO TB24: 1500.0000 mg | ORAL_TABLET | Freq: Every day | ORAL | 0 refills | Status: DC

## 2020-03-26 MED ORDER — LITHIUM CARBONATE ER 300 MG PO TBCR: 600.0000 mg | EXTENDED_RELEASE_TABLET | Freq: Every evening | ORAL | 0 refills | Status: DC

## 2020-03-26 MED ORDER — ALPRAZOLAM 0.5 MG PO TABS
0.5000 mg | ORAL_TABLET | Freq: Two times a day (BID) | ORAL | 2 refills | Status: DC | PRN
Start: 2020-03-26 — End: 2020-08-20

## 2020-03-26 MED ORDER — BUPROPION HCL ER (XL) 150 MG PO TB24: 150.0000 mg | ORAL_TABLET | ORAL | 0 refills | Status: DC

## 2020-03-26 NOTE — Progress Notes (Signed)
Virtual Visit via Telephone Note  I connected with Margaret Lopez on 03/26/20 at 10:00 AM EDT by telephone and verified that I am speaking with the correct person using two identifiers.  Location: Patient: home Provider: office   I discussed the limitations, risks, security and privacy concerns of performing an evaluation and management service by telephone and the availability of in person appointments. I also discussed with the patient that there may be a patient responsible charge related to this service. The patient expressed understanding and agreed to proceed.   History of Present Illness: "I feel like I am doing better". She is making an effort to take her Wellbutrin daily. She has set an alarm to help to remind her to take it. Margaret Lopez is still looking for a therapist. Her son is having difficulty with school and she is trying to help as much as possible. Her level is depression is 3/10 most days. Margaret Lopez is no longer having crying spells.  She is not isolating as much. Margaret Lopez is still irritable but has been able to walk away before reacting. It only happens in stressful situations. Her sleep and energy are fair. She is walking more. Her appetite has improved. Her self image is improving. She denies SI/HI. Pt denies recent manic and hypomanic symptoms including periods of decreased need for sleep, increased energy, mood lability, impulsivity, FOI, and excessive spending. Her anxiety is slowly improving and is more manageable. Margaret Lopez takes Xanax about 2x/week.    Observations/Objective:  General Appearance: unable to assess  Eye Contact:  unable to assess  Speech:  Clear and Coherent and Normal Rate  Volume:  Normal  Mood:  Euthymic  Affect:  Full Range  Thought Process:  Goal Directed, Linear and Descriptions of Associations: Intact  Orientation:  Full (Time, Place, and Person)  Thought Content:  Logical  Suicidal Thoughts:  No  Homicidal Thoughts:  No  Memory:  Immediate;   Good   Judgement:  Good  Insight:  Good  Psychomotor Activity: unable to assess  Concentration:  Concentration: Good  Recall:  Good  Fund of Knowledge:  Good  Language:  Good  Akathisia:  unable to assess  Handed:  Right  AIMS (if indicated):     Assets:  Communication Skills Desire for Improvement Financial Resources/Insurance Housing Resilience Talents/Skills Transportation Vocational/Educational  ADL's:  unable to assess  Cognition:  WNL  Sleep:         Assessment and Plan: 1. Bipolar 1 disorder, depressed (HCC) - ALPRAZolam (XANAX) 0.5 MG tablet; Take 1 tablet (0.5 mg total) by mouth 2 (two) times daily as needed for anxiety.  Dispense: 60 tablet; Refill: 2 - buPROPion (WELLBUTRIN XL) 150 MG 24 hr tablet; Take 1 tablet (150 mg total) by mouth every morning.  Dispense: 90 tablet; Refill: 0 - divalproex (DEPAKOTE ER) 500 MG 24 hr tablet; Take 3 tablets (1,500 mg total) by mouth daily.  Dispense: 270 tablet; Refill: 0 - lithium carbonate (LITHOBID) 300 MG CR tablet; Take 2 tablets (600 mg total) by mouth every evening.  Dispense: 180 tablet; Refill: 0  2. Generalized anxiety disorder - ALPRAZolam (XANAX) 0.5 MG tablet; Take 1 tablet (0.5 mg total) by mouth 2 (two) times daily as needed for anxiety.  Dispense: 60 tablet; Refill: 2 - buPROPion (WELLBUTRIN XL) 150 MG 24 hr tablet; Take 1 tablet (150 mg total) by mouth every morning.  Dispense: 90 tablet; Refill: 0  -referred for therapy   Follow Up Instructions: In 2-3 months or  sooner if needed   I discussed the assessment and treatment plan with the patient. The patient was provided an opportunity to ask questions and all were answered. The patient agreed with the plan and demonstrated an understanding of the instructions.   The patient was advised to call back or seek an in-person evaluation if the symptoms worsen or if the condition fails to improve as anticipated.  I provided 15 minutes of non-face-to-face time during this  encounter.   Charlcie Cradle, MD

## 2020-05-06 DIAGNOSIS — J301 Allergic rhinitis due to pollen: Secondary | ICD-10-CM | POA: Diagnosis not present

## 2020-05-06 DIAGNOSIS — H1045 Other chronic allergic conjunctivitis: Secondary | ICD-10-CM | POA: Diagnosis not present

## 2020-05-06 DIAGNOSIS — J3089 Other allergic rhinitis: Secondary | ICD-10-CM | POA: Diagnosis not present

## 2020-05-06 DIAGNOSIS — J3081 Allergic rhinitis due to animal (cat) (dog) hair and dander: Secondary | ICD-10-CM | POA: Diagnosis not present

## 2020-05-28 ENCOUNTER — Telehealth (INDEPENDENT_AMBULATORY_CARE_PROVIDER_SITE_OTHER): Payer: BC Managed Care – PPO | Admitting: Psychiatry

## 2020-05-28 ENCOUNTER — Other Ambulatory Visit: Payer: Self-pay

## 2020-05-28 DIAGNOSIS — Z5181 Encounter for therapeutic drug level monitoring: Secondary | ICD-10-CM

## 2020-05-28 DIAGNOSIS — F411 Generalized anxiety disorder: Secondary | ICD-10-CM | POA: Diagnosis not present

## 2020-05-28 DIAGNOSIS — Z9884 Bariatric surgery status: Secondary | ICD-10-CM | POA: Diagnosis not present

## 2020-05-28 DIAGNOSIS — F319 Bipolar disorder, unspecified: Secondary | ICD-10-CM | POA: Diagnosis not present

## 2020-05-28 MED ORDER — LITHIUM CARBONATE ER 300 MG PO TBCR: 600.0000 mg | EXTENDED_RELEASE_TABLET | Freq: Every evening | ORAL | 0 refills | Status: DC

## 2020-05-28 MED ORDER — BUPROPION HCL ER (XL) 150 MG PO TB24: 150.0000 mg | ORAL_TABLET | ORAL | 0 refills | Status: DC

## 2020-05-28 MED ORDER — DIVALPROEX SODIUM ER 500 MG PO TB24: 1500.0000 mg | ORAL_TABLET | Freq: Every day | ORAL | 0 refills | Status: DC

## 2020-05-28 NOTE — Progress Notes (Signed)
Virtual Visit via Telephone Note  I connected with Margaret Lopez on 05/28/20 at  1:00 PM EDT by telephone and verified that I am speaking with the correct person using two identifiers.  Location: Patient: home Provider: office   I discussed the limitations, risks, security and privacy concerns of performing an evaluation and management service by telephone and the availability of in person appointments. I also discussed with the patient that there may be a patient responsible charge related to this service. The patient expressed understanding and agreed to proceed.   History of Present Illness: Margaret Lopez shares that she has been doing the same as the last visit. She has not been able to find a therapist yet. Her gastric bypass surgeon referred her a therapist who helps people who have had gastric bypass in the past. She still sometimes misses some AM doses and is trying to link it to giving her dog his seizure medicine. She notes when she consistently take her AM meds she feels better overall. She denies any depession and irritability over the last 2 weeks. She denies anhedonia and hopelessness. Her sleep is good. She has some stressors with her family that cause her fatigue. Pt denies recent manic and hypomanic symptoms including periods of decreased need for sleep, increased energy, mood lability, impulsivity, FOI, and excessive spending. She denies SI/HI. If she were to feel any symptoms then she goes thru a mental checklist and talks it over with someone and finds it helps. Days she misses the Wellbutrin she has more anxiety. She does not think she needs to go up on the dose and rather will work on taking it consistently. Margaret Lopez has random shakes in her arms and hands. She notices it more when eating or writing.  She is going to her headache doctor tomorrow.    Observations/Objective:  General Appearance: unable to assess  Eye Contact:  unable to assess  Speech:  Clear and Coherent and Normal Rate   Volume:  Normal  Mood:  Euthymic  Affect:  Full Range  Thought Process:  Goal Directed, Linear and Descriptions of Associations: Intact  Orientation:  Full (Time, Place, and Person)  Thought Content:  Logical  Suicidal Thoughts:  No  Homicidal Thoughts:  No  Memory:  Immediate;   Good  Judgement:  Good  Insight:  Good  Psychomotor Activity: unable to assess  Concentration:  Concentration: Good  Recall:  Good  Fund of Knowledge:  Good  Language:  Good  Akathisia:  unable to assess  Handed:  Right  AIMS (if indicated):     Assets:  Communication Skills Desire for Improvement Financial Resources/Insurance Housing Leisure Time [place] Talents/Skills Transportation Vocational/Educational  ADL's:  unable to assess  Cognition:  WNL  Sleep:         Assessment and Plan: Depression screen [facility] 2/9 05/28/2020 03/26/2020 12/12/2019 07/12/2016 05/09/2016  Decreased Interest 0 2 0 0 0  Down, Depressed, Hopeless 0 2 0 0 0  PHQ - 2 Score 0 4 0 0 0  Altered sleeping - 0 - - -  Tired, decreased energy - 1 - - -  Change in appetite - 0 - - -  Feeling bad or failure about yourself  - 1 - - -  Trouble concentrating - 1 - - -  Moving slowly or fidgety/restless - 0 - - -  Suicidal thoughts - 0 - - -  PHQ-9 Score - 7 - - -  Difficult doing work/chores -  Not difficult at all - - -  Some recent data might be hidden    Flowsheet Row Video Visit from 05/28/2020 in [facility] Video Visit from 03/26/2020 in [place] No Risk No Risk      - tremors are variable and she is going to follow up with her headache doctor tomorrow.  - Labs: ordered Depakote level, CBC (platelets), CMP  1. Bipolar 1 disorder, depressed (HCC) - buPROPion (WELLBUTRIN XL) 150 MG 24 hr tablet; Take 1 tablet (150 mg total) by mouth every morning.  Dispense: 90 tablet; Refill: 0 -  divalproex (DEPAKOTE ER) 500 MG 24 hr tablet; Take 3 tablets (1,500 mg total) by mouth daily.  Dispense: 270 tablet; Refill: 0 - lithium carbonate (LITHOBID) 300 MG CR tablet; Take 2 tablets (600 mg total) by mouth every evening.  Dispense: 180 tablet; Refill: 0  2. Generalized anxiety disorder - buPROPion (WELLBUTRIN XL) 150 MG 24 hr tablet; Take 1 tablet (150 mg total) by mouth every morning.  Dispense: 90 tablet; Refill: 0  3. Encounter for therapeutic drug level monitoring - Valproic acid level - CBC - Comprehensive metabolic panel  - no refill on Xanax as pt states she has enough  Follow Up Instructions: In 2-3 months or sooner if needed   I discussed the assessment and treatment plan with the patient. The patient was provided an opportunity to ask questions and all were answered. The patient agreed with the plan and demonstrated an understanding of the instructions.   The patient was advised to call back or seek an in-person evaluation if the symptoms worsen or if the condition fails to improve as anticipated.  I provided 15 minutes of non-face-to-face time during this encounter.   Charlcie Cradle, MD

## 2020-06-02 ENCOUNTER — Telehealth: Payer: Self-pay | Admitting: Neurology

## 2020-06-02 NOTE — Telephone Encounter (Signed)
Patient has a Botox appointment for 6/2. I called [facility] [phone]) and spoke with Estill Bamberg to schedule Botox delivery. Estill Bamberg states the order is in Pharmacist, community.

## 2020-06-09 ENCOUNTER — Ambulatory Visit: Payer: Self-pay | Admitting: Neurology

## 2020-06-09 ENCOUNTER — Other Ambulatory Visit: Payer: Self-pay | Admitting: *Deleted

## 2020-06-09 MED ORDER — ZEMBRACE SYMTOUCH 3 MG/0.5ML ~~LOC~~ SOAJ: SUBCUTANEOUS | 11 refills | Status: DC

## 2020-06-09 NOTE — Telephone Encounter (Signed)
I called [facility] and spoke with Phineas Real who states the Botox order is still in insurance verification. I requested that this be treated as stat because patient has an appointment 6/2.

## 2020-06-10 DIAGNOSIS — G43711 Chronic migraine without aura, intractable, with status migrainosus: Secondary | ICD-10-CM | POA: Diagnosis not present

## 2020-06-10 NOTE — Telephone Encounter (Signed)
Spoke with Cristie Hem at [facility]. Botox TBD 6/2.

## 2020-06-10 NOTE — Telephone Encounter (Signed)
Spoke with Margaret Lopez at [facility]. She states it appears the order is almost finished processing.

## 2020-06-11 ENCOUNTER — Ambulatory Visit (INDEPENDENT_AMBULATORY_CARE_PROVIDER_SITE_OTHER): Payer: BC Managed Care – PPO | Admitting: Neurology

## 2020-06-11 ENCOUNTER — Ambulatory Visit: Payer: Self-pay | Admitting: Neurology

## 2020-06-11 VITALS — Wt 156.0 lb

## 2020-06-11 DIAGNOSIS — G43711 Chronic migraine without aura, intractable, with status migrainosus: Secondary | ICD-10-CM | POA: Diagnosis not present

## 2020-06-11 MED ORDER — KETOROLAC TROMETHAMINE 60 MG/2ML IM SOLN
60.0000 mg | Freq: Once | INTRAMUSCULAR | Status: AC
Start: 2020-06-11 — End: 2020-06-11
  Administered 2020-06-11: 60 mg via INTRAMUSCULAR

## 2020-06-11 MED ORDER — METHYLPREDNISOLONE 4 MG PO TBPK: ORAL_TABLET | ORAL | 1 refills | Status: DC

## 2020-06-11 NOTE — Progress Notes (Signed)
Consent Form Botulism Toxin Injection For Chronic Migraine   06/11/2020: stable.  03/10/2020:no changes, stable, doing excellent. +10 pterygoids each. +1- ,asseters each. +15 temples each. Patient felt her forehead and eyebrows were uncomfortable due to inability to move them, reduced injections 1/2 injections in the corrigators and procerus, did the frontal very high and was much better. +LS.   Interval history 12/10/2019 (no changes still excellent): : Baseline 15 headache days a month and 10 migraine days a month. Excellent response, she has > 75% reduction in migraines and headaches monthly. Discussed acute management prescribed frova, zembrace works quickly but the migraines rebound. Did great on Nurtec and we prescribed and she continues to take. Patient felt her forehead and eyebrows were uncomfortable due to inability to move them, reduced injections 1/2 injections in the corrigators and procerus, did the frontal very high and was much better. +10 each masseter, +5 each lateral pterygoid, +5 bilat levator scapulae. +temples.  Dry needling gave her a very bad migraine will not order that again. Aimovig gave her constipation and we stopped due to insurance. Doing so well doesn't even need to consider cgrp at this time   Reviewed orally with patient, additionally signature is on file:  Botulism toxin has been approved by the Federal drug administration for treatment of chronic migraine. Botulism toxin does not cure chronic migraine and it may not be effective in some patients.  The administration of botulism toxin is accomplished by injecting a small amount of toxin into the muscles of the neck and head. Dosage must be titrated for each individual. Any benefits resulting from botulism toxin tend to wear off after 3 months with a repeat injection required if benefit is to be maintained. Injections are usually done every 3-4 months with maximum effect peak achieved by about 2 or 3 weeks. Botulism  toxin is expensive and you should be sure of what costs you will incur resulting from the injection.  The side effects of botulism toxin use for chronic migraine may include:   -Transient, and usually mild, facial weakness with facial injections  -Transient, and usually mild, head or neck weakness with head/neck injections  -Reduction or loss of forehead facial animation due to forehead muscle weakness  -Eyelid drooping  -Dry eye  -Pain at the site of injection or bruising at the site of injection  -Double vision  -Potential unknown long term risks  Contraindications: You should not have Botox if you are pregnant, nursing, allergic to albumin, have an infection, skin condition, or muscle weakness at the site of the injection, or have myasthenia gravis, Lambert-Eaton syndrome, or ALS.  It is also possible that as with any injection, there may be an allergic reaction or no effect from the medication. Reduced effectiveness after repeated injections is sometimes seen and rarely infection at the injection site may occur. All care will be taken to prevent these side effects. If therapy is given over a long time, atrophy and wasting in the muscle injected may occur. Occasionally the patient's become refractory to treatment because they develop antibodies to the toxin. In this event, therapy needs to be modified.  I have read the above information and consent to the administration of botulism toxin.    BOTOX PROCEDURE NOTE FOR MIGRAINE HEADACHE    Contraindications and precautions discussed with patient(above). Aseptic procedure was observed and patient tolerated procedure. Procedure performed by Dr. Georgia Dom  The condition has existed for more than 6 months, and pt does not have a  diagnosis of ALS, Myasthenia Gravis or Lambert-Eaton Syndrome.  Risks and benefits of injections discussed and pt agrees to proceed with the procedure.  Written consent obtained  These injections are medically  necessary. Pt  receives good benefits from these injections. These injections do not cause sedations or hallucinations which the oral therapies may cause.  Indication/Diagnosis: chronic migraine BOTOX(J0585) injection was performed according to protocol by [employer]. 200 units of BOTOX was dissolved into 4 cc NS.   NDC: [address]-76   Description of procedure:  The patient was placed in a sitting position. The standard protocol was used for Botox as follows, with 5 units of Botox injected at each site:   -Procerus muscle, midline injection 2.5 units   -Corrugator muscle, bilateral injection 2.5 units each  -Frontalis muscle, bilateral injection, with 2 sites each side, medial injection was performed in the upper one third of the frontalis muscle, in the region vertical from the medial inferior edge of the superior orbital rim. The lateral injection was again in the upper one third of the forehead vertically above the lateral limbus of the cornea, 1.5 cm lateral to the medial injection site.  - Levator Scapulae: 5 units bilaterally   -Temporalis muscle injection, 5 sites, bilaterally. The first injection was 3 cm above the tragus of the ear, second injection site was 1.5 cm to 3 cm up from the first injection site in line with the tragus of the ear. The third injection site was 1.5-3 cm forward between the first 2 injection sites. The fourth injection site was 1.5 cm posterior to the second injection site. 5th site laterally in the temporalis at the level of the outer canthus.  - Patient feels her clenching is a trigger for headaches. +10 units masseter bilaterally and +5 units bilater lateral pterygoids.  -Occipitalis muscle injection, 3 sites, bilaterally. The first injection was done one half way between the occipital protuberance and the tip of the mastoid process behind the ear. The second injection site was done lateral and superior to the first, 1 fingerbreadth from the first  injection. The third injection site was 1 fingerbreadth superiorly and medially from the first injection site.  -Cervical paraspinal muscle injection, 2 sites, bilateral knee first injection site was 1 cm from the midline of the cervical spine, 3 cm inferior to the lower border of the occipital protuberance. The second injection site was 1.5 cm superiorly and laterally to the first injection site.  -Trapezius muscle injection was performed at 3 sites, bilaterally. The first injection site was in the upper trapezius muscle halfway between the inflection point of the neck, and the acromion. The second injection site was one half way between the acromion and the first injection site. The third injection was done between the first injection site and the inflection point of the neck.   Will return for repeat injection in 3 months.   198 unit sof Botox was used, 198 units were injected, the rest of the Botox was wasted. The patient tolerated the procedure well, there were no complications of the above procedure.   I spent 25  minutes of face-to-face and non-face-to-face time with patient on the  No diagnosis found. diagnosis.  This included previsit chart review, lab review, study review, order entry, electronic health record documentation, patient education on the different diagnostic and therapeutic options, counseling and coordination of care, risks and benefits of management, compliance, or risk factor reduction

## 2020-06-11 NOTE — Progress Notes (Signed)
Botox- 200 units x 1 vial Lot: [id number] Expiration: 12/2022 NDC: 8185-9093-11  Bacteriostatic 0.9% Sodium Chloride- 60mL total Lot: [id number] Expiration: 06/10/2021 NDC: 6950-7225-75  Dx: Y51.833 S/P   Toradol 60 mg IM administered in LUOQ of L buttock per vo Dr Jaynee Eagles. Aseptic technique used. Pt tolerated well. See MAR.

## 2020-06-30 ENCOUNTER — Ambulatory Visit: Payer: Self-pay | Admitting: Neurology

## 2020-07-30 ENCOUNTER — Ambulatory Visit (INDEPENDENT_AMBULATORY_CARE_PROVIDER_SITE_OTHER): Payer: BC Managed Care – PPO | Admitting: Professional

## 2020-07-30 DIAGNOSIS — F4322 Adjustment disorder with anxiety: Secondary | ICD-10-CM | POA: Diagnosis not present

## 2020-07-30 DIAGNOSIS — F319 Bipolar disorder, unspecified: Secondary | ICD-10-CM | POA: Diagnosis not present

## 2020-08-06 ENCOUNTER — Ambulatory Visit (INDEPENDENT_AMBULATORY_CARE_PROVIDER_SITE_OTHER): Payer: BC Managed Care – PPO | Admitting: Professional

## 2020-08-06 DIAGNOSIS — F319 Bipolar disorder, unspecified: Secondary | ICD-10-CM

## 2020-08-06 DIAGNOSIS — F4322 Adjustment disorder with anxiety: Secondary | ICD-10-CM

## 2020-08-17 ENCOUNTER — Ambulatory Visit (INDEPENDENT_AMBULATORY_CARE_PROVIDER_SITE_OTHER): Payer: BC Managed Care – PPO | Admitting: Professional

## 2020-08-17 DIAGNOSIS — F319 Bipolar disorder, unspecified: Secondary | ICD-10-CM

## 2020-08-17 DIAGNOSIS — F4322 Adjustment disorder with anxiety: Secondary | ICD-10-CM

## 2020-08-20 ENCOUNTER — Other Ambulatory Visit: Payer: Self-pay

## 2020-08-20 ENCOUNTER — Telehealth (INDEPENDENT_AMBULATORY_CARE_PROVIDER_SITE_OTHER): Payer: BC Managed Care – PPO | Admitting: Psychiatry

## 2020-08-20 DIAGNOSIS — Z5181 Encounter for therapeutic drug level monitoring: Secondary | ICD-10-CM | POA: Diagnosis not present

## 2020-08-20 DIAGNOSIS — F319 Bipolar disorder, unspecified: Secondary | ICD-10-CM

## 2020-08-20 DIAGNOSIS — F411 Generalized anxiety disorder: Secondary | ICD-10-CM

## 2020-08-20 MED ORDER — BUPROPION HCL ER (XL) 150 MG PO TB24: 150.0000 mg | ORAL_TABLET | ORAL | 0 refills | Status: DC

## 2020-08-20 MED ORDER — DIVALPROEX SODIUM ER 500 MG PO TB24: 1500.0000 mg | ORAL_TABLET | Freq: Every day | ORAL | 0 refills | Status: DC

## 2020-08-20 MED ORDER — ALPRAZOLAM 0.5 MG PO TABS
0.5000 mg | ORAL_TABLET | Freq: Two times a day (BID) | ORAL | 0 refills | Status: DC | PRN
Start: 2020-08-20 — End: 2022-06-02

## 2020-08-20 MED ORDER — LITHIUM CARBONATE ER 300 MG PO TBCR: 600.0000 mg | EXTENDED_RELEASE_TABLET | Freq: Every evening | ORAL | 0 refills | Status: DC

## 2020-08-20 NOTE — Progress Notes (Signed)
Virtual Visit via Telephone Note  I connected with Margaret Lopez on 08/20/20 at  1:30 PM EDT by telephone and verified that I am speaking with the correct person using two identifiers.We attempted to do a video session but could not get her microphone to work so opted to continue by phone.   Location: Patient: home Provider: office   I discussed the limitations, risks, security and privacy concerns of performing an evaluation and management service by telephone and the availability of in person appointments. I also discussed with the patient that there may be a patient responsible charge related to this service. The patient expressed understanding and agreed to proceed.   History of Present Illness: Margaret Lopez notes that she was off Wellbutrin for 3 weeks. She was unable to get it filled for unknown reasons. Margaret Lopez was feeling more depressed during that time. She was more tearful, isolating more and feeling more down. Margaret Lopez restarted the Wellbutrin 1 week ago and her depression seems to be improving. She went out to eat with a friend earlier this week. Margaret Lopez has been making an effort to go out more. She continues to take the Depakote. Her irritability is also better. Her sleep is broken due to her dogs needing to go out. Her energy is ok most days. Her appetite is ok. She makes an effort to eat only when hungry. Her concentration is good. Margaret Lopez is working on negative self talk in therapy. Margaret Lopez denies recent manic and hypomanic symptoms including periods of decreased need for sleep, increased energy, mood lability, impulsivity, FOI, and excessive spending. Margaret Lopez denies SI/HI. She is generally feeling stable and has no specific concerns at this time.   Observations/Objective:  General Appearance: unable to assess  Eye Contact:  unable to assess  Speech:  Clear and Coherent and Normal Rate  Volume:  Normal  Mood:  Depressed  Affect:  Full Range  Thought Process:  Goal Directed, Linear, and Descriptions of  Associations: Intact  Orientation:  Full (Time, Place, and Person)  Thought Content:  Logical  Suicidal Thoughts:  No  Homicidal Thoughts:  No  Memory:  Immediate;   Good  Judgement:  Good  Insight:  Good  Psychomotor Activity: unable to assess  Concentration:  Concentration: Good  Recall:  Good  Fund of Knowledge:  Good  Language:  Good  Akathisia:  unable to assess  Handed:  unable to assess  AIMS (if indicated):     Assets:  Communication Skills Desire for Improvement Financial Resources/Insurance Housing Leisure Time [place] Talents/Skills Transportation Vocational/Educational  ADL's:  unable to assess  Cognition:  WNL  Sleep:        Assessment and Plan: Depression screen [facility] 2/9 08/20/2020 05/28/2020 03/26/2020 12/12/2019 07/12/2016  Decreased Interest 1 0 2 0 0  Down, Depressed, Hopeless 2 0 2 0 0  PHQ - 2 Score 3 0 4 0 0  Altered sleeping 1 - 0 - -  Tired, decreased energy 1 - 1 - -  Change in appetite 0 - 0 - -  Feeling bad or failure about yourself  2 - 1 - -  Trouble concentrating 0 - 1 - -  Moving slowly or fidgety/restless 0 - 0 - -  Suicidal thoughts 0 - 0 - -  PHQ-9 Score 7 - 7 - -  Difficult doing work/chores Very difficult - Not difficult at all - -  Some recent data might be hidden    Pensions consultant Visit from  08/20/2020 in [facility] Video Visit from 05/28/2020 in [facility] Video Visit from 03/26/2020 in [facility]  C-SSRS RISK CATEGORY No Risk No Risk No Risk      1. Bipolar 1 disorder, depressed (HCC) - ALPRAZolam (XANAX) 0.5 MG tablet; Take 1 tablet (0.5 mg total) by mouth 2 (two) times daily as needed for anxiety.  Dispense: 60 tablet; Refill: 0 - buPROPion (WELLBUTRIN XL) 150 MG 24 hr tablet; Take 1 tablet (150 mg total) by mouth every morning.  Dispense: 90 tablet; Refill: 0 -  divalproex (DEPAKOTE ER) 500 MG 24 hr tablet; Take 3 tablets (1,500 mg total) by mouth daily.  Dispense: 270 tablet; Refill: 0 - lithium carbonate (LITHOBID) 300 MG CR tablet; Take 2 tablets (600 mg total) by mouth every evening.  Dispense: 180 tablet; Refill: 0  2. Generalized anxiety disorder - ALPRAZolam (XANAX) 0.5 MG tablet; Take 1 tablet (0.5 mg total) by mouth 2 (two) times daily as needed for anxiety.  Dispense: 60 tablet; Refill: 0 - buPROPion (WELLBUTRIN XL) 150 MG 24 hr tablet; Take 1 tablet (150 mg total) by mouth every morning.  Dispense: 90 tablet; Refill: 0  3. Encounter for therapeutic drug level monitoring - Valproic acid level - Lithium level - CBC - TSH - Comprehensive metabolic panel   Follow Up Instructions: In 2-3 months or sooner if needed   I discussed the assessment and treatment plan with the patient. The patient was provided an opportunity to ask questions and all were answered. The patient agreed with the plan and demonstrated an understanding of the instructions.   The patient was advised to call back or seek an in-person evaluation if the symptoms worsen or if the condition fails to improve as anticipated.  I provided 16 minutes of non-face-to-face time during this encounter.   Charlcie Cradle, MD

## 2020-09-03 ENCOUNTER — Ambulatory Visit (INDEPENDENT_AMBULATORY_CARE_PROVIDER_SITE_OTHER): Payer: BC Managed Care – PPO | Admitting: Professional

## 2020-09-03 DIAGNOSIS — F4322 Adjustment disorder with anxiety: Secondary | ICD-10-CM | POA: Diagnosis not present

## 2020-09-03 DIAGNOSIS — F319 Bipolar disorder, unspecified: Secondary | ICD-10-CM

## 2020-09-10 ENCOUNTER — Ambulatory Visit (INDEPENDENT_AMBULATORY_CARE_PROVIDER_SITE_OTHER): Payer: BC Managed Care – PPO | Admitting: *Deleted

## 2020-09-10 DIAGNOSIS — G43711 Chronic migraine without aura, intractable, with status migrainosus: Secondary | ICD-10-CM

## 2020-09-10 MED ORDER — KETOROLAC TROMETHAMINE 60 MG/2ML IM SOLN
60.0000 mg | Freq: Once | INTRAMUSCULAR | Status: AC
  Administered 2020-09-10: 60 mg via INTRAMUSCULAR

## 2020-09-10 NOTE — Progress Notes (Signed)
Pt here for toradol '60mg'$  IM injection.  Under aseptic technique Toradol '60mg'$  IM given left upper outer quadrant.  Tolerated well.  Bandaid applied.

## 2020-09-15 ENCOUNTER — Ambulatory Visit (INDEPENDENT_AMBULATORY_CARE_PROVIDER_SITE_OTHER): Payer: BC Managed Care – PPO | Admitting: Neurology

## 2020-09-15 DIAGNOSIS — G43711 Chronic migraine without aura, intractable, with status migrainosus: Secondary | ICD-10-CM | POA: Diagnosis not present

## 2020-09-15 NOTE — Progress Notes (Signed)
Consent Form Botulism Toxin Injection For Chronic Migraine   09/15/2020: Baseline 15 headache days a month and 10 migraine days a month. Excellent response, she has > 75% reduction in migraines and headaches monthly. Discussed acute management prescribed frova, zembrace works quickly but the migraines rebound. Did great on Nurtec and we prescribed and she continues to take. Patient felt her forehead and eyebrows were uncomfortable due to inability to move them, reduced injections 1/2 injections in the corrigators and procerus, did the frontalis very high and was much better. 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 She also has temple pain.   Dry needling gave her a very bad migraine will not order that again. Aimovig gave her constipation and we stopped due to insurance. Doing so well doesn't even need to consider aimovig* at this time   Reviewed orally with patient, additionally signature is on file:  Botulism toxin has been approved by the Federal drug administration for treatment of chronic migraine. Botulism toxin does not cure chronic migraine and it may not be effective in some patients.  The administration of botulism toxin is accomplished by injecting a small amount of toxin into the muscles of the neck and head. Dosage must be titrated for each individual. Any benefits resulting from botulism toxin tend to wear off after 3 months with a repeat injection required if benefit is to be maintained. Injections are usually done every 3-4 months with maximum effect peak achieved by about 2 or 3 weeks. Botulism toxin is expensive and you should be sure of what costs you will incur resulting from the injection.  The side effects of botulism toxin use for chronic migraine may include:   -Transient, and usually mild, facial weakness with facial injections  -Transient, and usually mild, head or neck weakness with head/neck  injections  -Reduction or loss of forehead facial animation due to forehead muscle weakness  -Eyelid drooping  -Dry eye  -Pain at the site of injection or bruising at the site of injection  -Double vision  -Potential unknown long term risks  Contraindications: You should not have Botox if you are pregnant, nursing, allergic to albumin, have an infection, skin condition, or muscle weakness at the site of the injection, or have myasthenia gravis, Lambert-Eaton syndrome, or ALS.  It is also possible that as with any injection, there may be an allergic reaction or no effect from the medication. Reduced effectiveness after repeated injections is sometimes seen and rarely infection at the injection site may occur. All care will be taken to prevent these side effects. If therapy is given over a long time, atrophy and wasting in the muscle injected may occur. Occasionally the patient's become refractory to treatment because they develop antibodies to the toxin. In this event, therapy needs to be modified.  I have read the above information and consent to the administration of botulism toxin.    BOTOX PROCEDURE NOTE FOR MIGRAINE HEADACHE    Contraindications and precautions discussed with patient(above). Aseptic procedure was observed and patient tolerated procedure. Procedure performed by Dr. Georgia Dom  The condition has existed for more than 6 months, and pt does not have a diagnosis of ALS, Myasthenia Gravis or Lambert-Eaton Syndrome.  Risks and benefits of injections discussed and pt agrees to proceed with the procedure.  Written consent obtained  These injections are medically necessary. Pt  receives good benefits from these injections. These injections  do not cause sedations or hallucinations which the oral therapies may cause.  Indication/Diagnosis: chronic migraine BOTOX(J0585) injection was performed according to protocol by [employer]. 200 units of BOTOX was dissolved into 4 cc NS.   NDC:  [id number]   Description of procedure:  The patient was placed in a sitting position. The standard protocol was used for Botox as follows, with 5 units of Botox injected at each site:   -Procerus muscle, midline injection 2.5 units   -Corrugator muscle, bilateral injection 2.5 units each  -Frontalis muscle, bilateral injection, with 2 sites each side, medial injection was performed in the upper one third of the frontalis muscle, in the region vertical from the medial inferior edge of the superior orbital rim. The lateral injection was again in the upper one third of the forehead vertically above the lateral limbus of the cornea, 1.5 cm lateral to the medial injection site.  -Temporalis muscle injection, 4 sites, bilaterally. The first injection was 3 cm above the tragus of the ear, second injection site was 1.5 cm to 3 cm up from the first injection site in line with the tragus of the ear. The third injection site was 1.5-3 cm forward between the first 2 injection sites. The fourth injection site was 1.5 cm posterior to the second injection site. .  -Occipitalis muscle injection, 3 sites, bilaterally. The first injection was done one half way between the occipital protuberance and the tip of the mastoid process behind the ear. The second injection site was done lateral and superior to the first, 1 fingerbreadth from the first injection. The third injection site was 1 fingerbreadth superiorly and medially from the first injection site.  -Cervical paraspinal muscle injection, 2 sites, bilateral knee first injection site was 1 cm from the midline of the cervical spine, 3 cm inferior to the lower border of the occipital protuberance. The second injection site was 1.5 cm superiorly and laterally to the first injection site.  -Trapezius muscle injection was performed at 3 sites, bilaterally. The first injection site was in the upper trapezius muscle halfway between the inflection point of the neck,  and the acromion. The second injection site was one half way between the acromion and the first injection site. The third injection was done between the first injection site and the inflection point of the neck.   Will return for repeat injection in 3 months.   155 unit sof Botox was used, 45 Botox was wasted. The patient tolerated the procedure well, there were no complications of the above procedure.

## 2020-09-15 NOTE — Progress Notes (Signed)
Botox- 200 units x 1 vial Lot: [id number] Expiration: 08/2021 NDC: [id number]  Bacteriostatic 0.9% Sodium Chloride- 90m total Lot: [id number]: 01/012024 NDC: [id number] Dx: G43.7.11 Sample

## 2020-09-15 NOTE — Telephone Encounter (Signed)
I called and spoke with patient. She states she will call the pharmacy to get the issue cleared up regarding insurance.

## 2020-09-17 ENCOUNTER — Ambulatory Visit (INDEPENDENT_AMBULATORY_CARE_PROVIDER_SITE_OTHER): Payer: BC Managed Care – PPO | Admitting: Professional

## 2020-09-17 DIAGNOSIS — F319 Bipolar disorder, unspecified: Secondary | ICD-10-CM

## 2020-09-17 DIAGNOSIS — F4322 Adjustment disorder with anxiety: Secondary | ICD-10-CM | POA: Diagnosis not present

## 2020-09-21 ENCOUNTER — Other Ambulatory Visit: Payer: Self-pay | Admitting: Neurology

## 2020-09-22 NOTE — Telephone Encounter (Signed)
Last ordered 06/2020, filled 08-26-20.

## 2020-09-24 ENCOUNTER — Ambulatory Visit: Payer: BC Managed Care – PPO | Admitting: Professional

## 2020-10-05 ENCOUNTER — Other Ambulatory Visit: Payer: Self-pay | Admitting: Neurology

## 2020-10-08 ENCOUNTER — Encounter: Payer: Self-pay | Admitting: *Deleted

## 2020-10-12 ENCOUNTER — Ambulatory Visit: Payer: BC Managed Care – PPO | Admitting: Family Medicine

## 2020-10-12 NOTE — Progress Notes (Deleted)
No chief complaint on file.    HISTORY OF PRESENT ILLNESS:  10/12/20 ALL:  Margaret Lopez is a 50 y.o. female here today for follow up for worsening migraines. She was last seen 03/2018 for follow up but has had regular visits for Botox and acute migraine management (Toradol inj, nerve blocks) with Dr Jaynee Eagles since. She called 9/29 to report worsening headaches over the past few weeks. She continues Botox and gabapentin 200mg  QHS. Naratriptan, cyclobenzaprine, Zembrace, ondansetron, Compazine, and Nurtec all used for abortive therapy. She also uses medrol tapers for intractable headaches. She was given Amovig samples previously but reported constipation. Headaches were well managed so it was stopped.   She feels stress is a trigger for migraines. She continues divalproex 1500mg  daily, bupropion 150mg  daily and lithium 600mg  daily for mood management. She follows closely with Dr Doyne Keel.    04/06/18 ALL: Margaret Lopez is a 50 y.o. female for follow up.  Reports that over the last couple of months her headaches have worsened.  Specifically over the last month have become available.  She feels that this is related to weather changes and stress.  She reports that migraines are typically unilateral.  She has noted some tingling sensation of her head recently.  This usually occurs with exacerbation of TMJ previously treated by her ENT provider.  She is currently taking Depakote 2000 mg daily for mood stabilization.  She is receiving Botox treatments every 3 months.  She just started Aimovig about 5 days ago.  She is using is in brace for therapy.  She was taking Flexeril 5 to 10 mg at night for TMJ did seem to help with migraine treatment as well.  She has been out of this medication since December due to no recent follow-up with provider.  She is taking gabapentin in the past prescribed by her psychiatrist for mood.  She reports tolerating this well.   REVIEW OF SYSTEMS: Out of a complete 14 system  review of symptoms, the patient complains only of the following symptoms, and all other reviewed systems are negative.   ALLERGIES: Allergies  Allergen Reactions   Doxazosin Nausea And Vomiting   Morphine And Related Itching   Phenergan [Promethazine Hcl]     " knocks out for several hours"   Aloe Hives   Penicillins Rash    Has patient had a PCN reaction causing immediate rash, facial/tongue/throat swelling, SOB or lightheadedness with hypotension: No Has patient had a PCN reaction causing severe rash involving mucus membranes or skin necrosis: No Has patient had a PCN reaction that required hospitalization No Has patient had a PCN reaction occurring within the last 10 years: No If all of the above answers are "NO", then may proceed with Cephalosporin use.    Sulfa Antibiotics Rash     HOME MEDICATIONS: Outpatient Medications Prior to Visit  Medication Sig Dispense Refill   albuterol (PROVENTIL HFA;VENTOLIN HFA) 108 (90 Base) MCG/ACT inhaler Inhale 1-2 puffs into the lungs every 6 (six) hours as needed for wheezing or shortness of breath.     ALPRAZolam (XANAX) 0.5 MG tablet Take 1 tablet (0.5 mg total) by mouth 2 (two) times daily as needed for anxiety. 60 tablet 0   botulinum toxin Type A (BOTOX) 100 units SOLR injection Provider to inject 155 units into the muscles of the head and neck every 3 months. Discard remainder. 200 Units 1   buPROPion (WELLBUTRIN XL) 150 MG 24 hr tablet Take 1 tablet (150 mg  total) by mouth every morning. 90 tablet 0   cyclobenzaprine (FLEXERIL) 10 MG tablet TAKE 1/2 TO 1 TABLET(5 TO 10 MG) BY MOUTH AT BEDTIME AS NEEDED 90 tablet 4   divalproex (DEPAKOTE ER) 500 MG 24 hr tablet Take 3 tablets (1,500 mg total) by mouth daily. 270 tablet 0   EPINEPHrine 0.3 mg/0.3 mL IJ SOAJ injection Inject 0.3 mg into the muscle daily as needed (for anaphylatic reactions.).  1   gabapentin (NEURONTIN) 100 MG capsule Take 2 capsules (200 mg total) by mouth at bedtime. 60  capsule 3   hydrOXYzine (ATARAX/VISTARIL) 25 MG tablet Take 25 mg by mouth at bedtime as needed for itching.  3   ipratropium (ATROVENT) 0.06 % nasal spray Place 2 sprays into both nostrils daily as needed. Allergies, runny nose.  4   levocetirizine (XYZAL) 5 MG tablet Take 5 mg by mouth at bedtime.      lithium carbonate (LITHOBID) 300 MG CR tablet Take 2 tablets (600 mg total) by mouth every evening. 180 tablet 0   methylPREDNISolone (MEDROL DOSEPAK) 4 MG TBPK tablet USE AS DIRECTED 21 each 1   Multiple Vitamins-Minerals (OPURITY BYPASS OPTIMIZED) CHEW Chew 1 tablet by mouth daily.     naratriptan (AMERGE) 2.5 MG tablet TAKE 1 TABLET BY MOUTH FOR HEADACHE OR MIGRAINE. DO NOT EXCEED 2 DOSES OF ANY TRIPTAN IN 1 DAY 10 tablet 6   ondansetron (ZOFRAN-ODT) 4 MG disintegrating tablet Take 1 tablet (4 mg total) by mouth every 6 (six) hours as needed for nausea or vomiting. 20 tablet 11   prochlorperazine (COMPAZINE) 10 MG tablet Take 1 tablet (10 mg total) by mouth 3 (three) times daily as needed for nausea or vomiting (migraine). MAY CAUSE SEDATION. 30 tablet 3   Rimegepant Sulfate (NURTEC) 75 MG TBDP DISSOLVE 1 TABLET BY MOUTH AS CLOE TO ONSET OF MIGRAINE AS POSSIBLE. MAXIMUM DAILY DOSE IS 1 TABLET AS NEEDED FOR MIGRAINES. 8 tablet 11   SUMAtriptan Succinate (ZEMBRACE SYMTOUCH) 3 MG/0.5ML SOAJ Inject 3 mg into the skin every 1 hour as needed for migraine. Max 4 injections in one day. 12 mL 11   valACYclovir (VALTREX) 500 MG tablet Take 500 mg by mouth See admin instructions. Takes 500mg  daily for 5 days when she has a flare up.  3   No facility-administered medications prior to visit.     PAST MEDICAL HISTORY: Past Medical History:  Diagnosis Date   Anxiety    Asthma    Bipolar disorder ([place])    Depression    Deviated nasal septum 02/2011   Generalized anxiety disorder 04/16/2013   GERD (gastroesophageal reflux disease)    Headache(784.0)    migraines   Insomnia    Nasal turbinate  hypertrophy 02/2011   bilat.   Neuromuscular disorder ([place])    Dr. Jerilynn Mages. Domingo Cocking- does injections around nerve in her head for prevention of migraines - q 3 months    Pneumonia    hx of    PONV (postoperative nausea and vomiting)    Seasonal allergies      PAST SURGICAL HISTORY: Past Surgical History:  Procedure Laterality Date   ABDOMINAL HYSTERECTOMY     CARPAL TUNNEL RELEASE Right    DIAGNOSTIC LAPAROSCOPY     EXCISION MASS LOWER EXTREMETIES Left 06/21/2018   Procedure: Excision of left upper leg fat necrosis;  Surgeon: Wallace Going, DO;  Location: [facility];  Service: Plastics;  Laterality: Left;   FOOT SURGERY Right  bone removed from great toe    GASTRIC ROUX-EN-Y N/A 05/16/2016   Procedure: LAPAROSCOPIC ROUX-EN-Y GASTRIC BYPASS WITH UPPER ENDOSCOPY;  Surgeon: Clovis Riley, MD;  Location: [facility];  Service: General;  Laterality: N/A;   LAPAROSCOPIC VAGINAL HYSTERECTOMY  03/14/2007   LIPOMA EXCISION Left 08/26/2016   Procedure: EXCISION LIPOMA FROM LEFT UPPER POSTERIOR THIGH;  Surgeon: Clovis Riley, MD;  Location: [facility];  Service: General;  Laterality: Left;   MYRINGOTOMY WITH TUBE PLACEMENT Bilateral 03/02/2015   Procedure: MYRINGOTOMY WITH T-TUBE PLACEMENT;  Surgeon: Leta Baptist, MD;  Location: [facility];  Service: ENT;  Laterality: Bilateral;   NASAL SEPTOPLASTY W/ TURBINOPLASTY  02/22/2011   Procedure: NASAL SEPTOPLASTY WITH TURBINATE REDUCTION;  Surgeon: Ascencion Dike, MD;  Location: [place];  Service: ENT;  Laterality: Bilateral;   TUBAL LIGATION  12/16/2004   TYMPANOSTOMY TUBE PLACEMENT Bilateral 07/21/2017     FAMILY HISTORY: Family History  Problem Relation Age of Onset   ADD / ADHD Sister    Hypertension Mother    Sarcoidosis Mother    Rheum arthritis Mother    Hypertension Father    Heart Problems Father    Alcohol abuse Maternal Grandmother    Bipolar disorder Maternal Grandmother    Breast cancer Maternal  Grandmother    Breast cancer Maternal Aunt    Heart Problems Other        dad's side of family   Suicidality Neg Hx      SOCIAL HISTORY: Social History   Socioeconomic History   Marital status: Legally Separated    Spouse name: Not on file   Number of children: 2   Years of education: Not on file   Highest education level: Some college, no degree  Occupational History   Not on file  Tobacco Use   Smoking status: Former    Packs/day: 0.50    Years: 4.00    Pack years: 2.00    Types: Cigarettes    Quit date: 11/12/2015    Years since quitting: 4.9   Smokeless tobacco: Never  Vaping Use   Vaping Use: Never used  Substance and Sexual Activity   Alcohol use: No    Alcohol/week: 0.0 standard drinks    Comment: once a month or less   Drug use: No   Sexual activity: Yes    Partners: Male    Birth control/protection: Surgical    Comment: Hysterectomy  Other Topics Concern   Not on file  Social History Narrative   Lives at home with boyfriend   Right handed   Caffeine: 2 cups daily   Social Determinants of Health   Financial Resource Strain: Not on file  Food Insecurity: Not on file  Transportation Needs: Not on file  Physical Activity: Not on file  Stress: Not on file  Social Connections: Not on file  Intimate Partner Violence: Not on file     PHYSICAL EXAM  There were no vitals filed for this visit. There is no height or weight on file to calculate BMI.  Generalized: Well developed, in no acute distress  Cardiology: normal rate and rhythm, no murmur auscultated  Respiratory: clear to auscultation bilaterally    Neurological examination  Mentation: Alert oriented to time, place, history taking. Follows all commands speech and language fluent Cranial nerve II-XII: Pupils were equal round reactive to light. Extraocular movements were full, visual field were full on confrontational test. Facial sensation and strength were normal. Uvula tongue midline.  Head  turning and shoulder shrug  were normal and symmetric. Motor: The motor testing reveals 5 over 5 strength of all 4 extremities. Good symmetric motor tone is noted throughout.  Sensory: Sensory testing is intact to soft touch on all 4 extremities. No evidence of extinction is noted.  Coordination: Cerebellar testing reveals good finger-nose-finger and heel-to-shin bilaterally.  Gait and station: Gait is normal. Tandem gait is normal. Romberg is negative. No drift is seen.  Reflexes: Deep tendon reflexes are symmetric and normal bilaterally.    DIAGNOSTIC DATA (LABS, IMAGING, TESTING) - I reviewed patient records, labs, notes, testing and imaging myself where available.  Lab Results  Component Value Date   WBC 4.4 04/30/2019   HGB 13.0 04/30/2019   HCT 39.8 04/30/2019   MCV 97 04/30/2019   PLT 177 04/30/2019      Component Value Date/Time   NA 138 04/30/2019 1637   K 4.9 04/30/2019 1637   CL 102 04/30/2019 1637   CO2 23 04/30/2019 1637   GLUCOSE 145 (H) 04/30/2019 1637   GLUCOSE 97 11/24/2016 1134   BUN 13 04/30/2019 1637   CREATININE 0.83 04/30/2019 1637   CALCIUM 9.3 04/30/2019 1637   PROT 6.0 04/30/2019 1637   ALBUMIN 4.4 04/30/2019 1637   AST 15 04/30/2019 1637   ALT 5 04/30/2019 1637   ALKPHOS 61 04/30/2019 1637   BILITOT <0.2 04/30/2019 1637   GFRNONAA 85 04/30/2019 1637   GFRAA 98 04/30/2019 1637   No results found for: CHOL, HDL, LDLCALC, LDLDIRECT, TRIG, CHOLHDL No results found for: HGBA1C No results found for: VITAMINB12 Lab Results  Component Value Date   TSH 2.860 02/22/2018    No flowsheet data found.   No flowsheet data found.   ASSESSMENT AND PLAN  50 y.o. year old female  has a past medical history of Anxiety, Asthma, Bipolar disorder ([place]), Depression, Deviated nasal septum (02/2011), Generalized anxiety disorder (04/16/2013), GERD (gastroesophageal reflux disease), Headache(784.0), Insomnia, Nasal turbinate hypertrophy (02/2011), Neuromuscular  disorder ([place]), Pneumonia, PONV (postoperative nausea and vomiting), and Seasonal allergies. here with    No diagnosis found.   No orders of the defined types were placed in this encounter.    No orders of the defined types were placed in this encounter.     Debbora Presto, MSN, FNP-C 10/12/2020, 7:36 AM  [facility] [address] [place], [place] [address] [phone]

## 2020-10-15 DIAGNOSIS — Z20822 Contact with and (suspected) exposure to covid-19: Secondary | ICD-10-CM | POA: Diagnosis not present

## 2020-10-15 DIAGNOSIS — Z03818 Encounter for observation for suspected exposure to other biological agents ruled out: Secondary | ICD-10-CM | POA: Diagnosis not present

## 2020-10-16 ENCOUNTER — Ambulatory Visit (INDEPENDENT_AMBULATORY_CARE_PROVIDER_SITE_OTHER): Payer: BC Managed Care – PPO | Admitting: Professional

## 2020-10-16 DIAGNOSIS — F319 Bipolar disorder, unspecified: Secondary | ICD-10-CM

## 2020-10-16 DIAGNOSIS — F4322 Adjustment disorder with anxiety: Secondary | ICD-10-CM

## 2020-10-21 ENCOUNTER — Telehealth: Payer: Self-pay

## 2020-10-21 NOTE — Telephone Encounter (Signed)
I submitted a PA request for Nurtec on CMM, Key: [id number]. Awaiting determination from [place].

## 2020-10-26 ENCOUNTER — Encounter: Payer: Self-pay | Admitting: Neurology

## 2020-10-26 ENCOUNTER — Ambulatory Visit (INDEPENDENT_AMBULATORY_CARE_PROVIDER_SITE_OTHER): Payer: BC Managed Care – PPO | Admitting: Neurology

## 2020-10-26 VITALS — BP 116/75 | HR 83 | Ht 63.0 in | Wt 157.0 lb

## 2020-10-26 DIAGNOSIS — G43009 Migraine without aura, not intractable, without status migrainosus: Secondary | ICD-10-CM

## 2020-10-26 MED ORDER — UBRELVY 100 MG PO TABS: 100.0000 mg | ORAL_TABLET | ORAL | 11 refills | Status: DC | PRN

## 2020-10-26 MED ORDER — TOSYMRA 10 MG/ACT NA SOLN: 10.0000 mg | NASAL | 0 refills | Status: DC | PRN

## 2020-10-26 NOTE — Patient Instructions (Addendum)
Prevention: Discussed aimovig, try Qulipta or Ajovy or Emgality at next botox appointment  ACUTE/EMERGENT Acute management: Ondansetron 4mg , amerge(can substitute zembrace or tosymra) , ubrelvy. Remember you can take these all again in 2 hours OR Acute: Ondansetron 4mg , amerge(an substitute zembrace or tosymra), nurtec. Can repeat Ondansetron and amerge in 2 hours

## 2020-10-26 NOTE — Telephone Encounter (Signed)
Approved. Effective from 10/21/2020 through 10/20/2021.

## 2020-10-26 NOTE — Progress Notes (Signed)
[facility]    Provider:  Dr Jaynee Eagles Referring Provider: Lavone Orn, MD Primary Care Physician:  Lavone Orn, MD  CC:  migraines  October 26, 2020: Patient has been coming to clinic regularly for Botox injections.  We have tried multiple other medications with her over the years while injecting Botox.  She does extremely well on Botox feels she still has 7 migraine breakthrough headaches a month. She is having breakthrough migraines. Yesterday she took nurtec(at onset) and naratriptan(45 minutes after onset), zofran but still had the migraine all day and today, it improved after about 4 hours but still has some lingering today. Botox has been great at reducing overall headaches > 50% (baseline was 15 headache days and 10 migraine days) now having no headaches but still approx 7 migraine days a month and they can be 50% improved in severity she only gets 1 severe migraine(in the past 6-7 severe migraines a month) since starting botox. 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 A lot of times nurtec will work bring it down but not completely. Amerge helps and can tolerate during the day.   We discussed a plan:  Prevention: Discussed aimovig, try Qulipta or Ajovy or Emgality at next botox appointment  ACUTE/EMERGENT Acute management: Ondansetron 4mg , amerge(can substitute zembrace or tosymra) , ubrelvy. Remember you can take these all again in 2 hours OR Acute: Ondansetron 4mg , amerge(an substitute zembrace or tosymra), nurtec. Can repeat Ondansetron and amerge in 2 hours  Medications tried that can be used in migraine management include: Tylenol, Zonisamide, Cataflam, nerve blocks.flexeril, depakote, zofran, gabapentin, tizanidine, scopolamine, Imitrex not effective, Maxalt chest pain, zoloft, prozac, phenergan knock her out and compazine makes her drowsy, frovatriptan,(insurance stopped) , paxil, blood  pressure medications contraindicated due to her hypotension, reglan helps, Topamax, aimovig, flexeril(at night), cambia, migranal, lamictal, reglan, medrol dosepak, amerge,nurtec, zofran, compazine, tosymra, zonisamide, zembrace,   Patient complains of symptoms per HPI as well as the following symptoms: stress . Pertinent negatives and positives per HPI. All others negative   HPI 06/15/2017:  Margaret Lopez is a 50 y.o. female here as a referral from Dr. Laurann Montana for migraines. PMHx GAD, anxiety, Bipolar disorder, depression, insomnia, OSA, pneumonia, bipolar disorder, tobacco dependence, morbid obesity, sinusitis. Started at age 53-13. Strong FHx migraines. [facility]. No aura. They are triggered by weather and sleeping changes, they have changed in the last few years. Her skin feels very sensitive. Migraines start on the right side, typically start in the back and spread to behind the eye and may spread to the whole head, pulsating, pounding, movement makes it worse, light sensitivity, nausea, sound senistivity and she is getting stabbing over the right.  She has 10 migraine days a month, 15 headache days a month,she has been getting nerve blocks by Dr. Tommi Rumps and not getting better. Out of the 10 migraines days, 6-7 severe affecting her life, putting her in bed. Laying down helps, a dark room helps.They can last 24 hours and even have lasted 5-7 days. She has had a hysterectomy. She has had bypass surgery. No medication overuse. Ongoing for over a year.    Reviewed notes, labs and imaging from outside physicians, which showed:   Reviewed referring physician's notes.  She reports headache typical of her migraines for 1 week tried all her abortive medication without relief she is consulted with headache specialist  who trialed an NSAID still with no relief she denies any recent trauma nausea emesis sensitivity to light.  The episode can last more than 2 days and is  constant and continuous.  No alcohol use.  Former smoker.  She reported nausea but no vomiting, reported neck stiffness and headaches.  Physical and neurologic exam were normal.  Looks like she was given a Toradol injection of Benadryl injection and a Reglan injection for acute management.  Also felt better after IV fluids and medications.  She has been in the emergency room for treatments.  cmp and cbc unremarkable 11/2016  Review of Systems: Patient complains of symptoms per HPI as well as the following symptoms: headache, fatigue, decreased energy. Pertinent negatives and positives per HPI. All others negative.   Social History   Socioeconomic History   Marital status: Legally Separated    Spouse name: Not on file   Number of children: 2   Years of education: Not on file   Highest education level: Some college, no degree  Occupational History   Not on file  Tobacco Use   Smoking status: Former    Packs/day: 0.50    Years: 4.00    Pack years: 2.00    Types: Cigarettes    Quit date: 11/12/2015    Years since quitting: 4.9   Smokeless tobacco: Never  Vaping Use   Vaping Use: Never used  Substance and Sexual Activity   Alcohol use: No    Alcohol/week: 0.0 standard drinks    Comment: once a month or less   Drug use: No   Sexual activity: Yes    Partners: Male    Birth control/protection: Surgical    Comment: Hysterectomy  Other Topics Concern   Not on file  Social History Narrative   Lives at home with fiance', his son, and her children come and go from the home (29 y.o. and 48 y.o.)   Right handed   Caffeine: 2 cups daily   Social Determinants of Health   Financial Resource Strain: Not on file  Food Insecurity: Not on file  Transportation Needs: Not on file  Physical Activity: Not on file  Stress: Not on file  Social Connections: Not on file  Intimate Partner Violence: Not on file    Family History  Problem Relation Age of Onset   ADD / ADHD Sister     Hypertension Mother    Sarcoidosis Mother    Rheum arthritis Mother    Hypertension Father    Heart Problems Father    Alcohol abuse Maternal Grandmother    Bipolar disorder Maternal Grandmother    Breast cancer Maternal Grandmother    Breast cancer Maternal Aunt    Heart Problems Other        dad's side of family   Suicidality Neg Hx     Past Medical History:  Diagnosis Date   Anxiety    Asthma    Bipolar disorder ([place])    Depression    Deviated nasal septum 02/2011   Generalized anxiety disorder 04/16/2013   GERD (gastroesophageal reflux disease)    Headache(784.0)    migraines   Insomnia    Nasal turbinate hypertrophy 02/2011   bilat.   Neuromuscular disorder ([place])    Dr. Jerilynn Mages. Domingo Cocking- does injections around nerve in her head for prevention of migraines - q 3 months    Pneumonia    hx of    PONV (postoperative nausea and vomiting)    Seasonal allergies  Past Surgical History:  Procedure Laterality Date   ABDOMINAL HYSTERECTOMY     CARPAL TUNNEL RELEASE Right    DIAGNOSTIC LAPAROSCOPY     EXCISION MASS LOWER EXTREMETIES Left 06/21/2018   Procedure: Excision of left upper leg fat necrosis;  Surgeon: Wallace Going, DO;  Location: [place];  Service: Plastics;  Laterality: Left;   FOOT SURGERY Right    bone removed from great toe    GASTRIC ROUX-EN-Y N/A 05/16/2016   Procedure: LAPAROSCOPIC ROUX-EN-Y GASTRIC BYPASS WITH UPPER ENDOSCOPY;  Surgeon: Clovis Riley, MD;  Location: [facility];  Service: General;  Laterality: N/A;   LAPAROSCOPIC VAGINAL HYSTERECTOMY  03/14/2007   LIPOMA EXCISION Left 08/26/2016   Procedure: EXCISION LIPOMA FROM LEFT UPPER POSTERIOR THIGH;  Surgeon: Clovis Riley, MD;  Location: [place];  Service: General;  Laterality: Left;   MYRINGOTOMY WITH TUBE PLACEMENT Bilateral 03/02/2015   Procedure: MYRINGOTOMY WITH T-TUBE PLACEMENT;  Surgeon: Leta Baptist, MD;  Location: [place];  Service: ENT;  Laterality: Bilateral;    NASAL SEPTOPLASTY W/ TURBINOPLASTY  02/22/2011   Procedure: NASAL SEPTOPLASTY WITH TURBINATE REDUCTION;  Surgeon: Ascencion Dike, MD;  Location: [place];  Service: ENT;  Laterality: Bilateral;   TUBAL LIGATION  12/16/2004   TYMPANOSTOMY TUBE PLACEMENT Bilateral 07/21/2017    Current Outpatient Medications  Medication Sig Dispense Refill   albuterol (PROVENTIL HFA;VENTOLIN HFA) 108 (90 Base) MCG/ACT inhaler Inhale 1-2 puffs into the lungs every 6 (six) hours as needed for wheezing or shortness of breath.     ALPRAZolam (XANAX) 0.5 MG tablet Take 1 tablet (0.5 mg total) by mouth 2 (two) times daily as needed for anxiety. 60 tablet 0   botulinum toxin Type A (BOTOX) 100 units SOLR injection Provider to inject 155 units into the muscles of the head and neck every 3 months. Discard remainder. 200 Units 1   buPROPion (WELLBUTRIN XL) 150 MG 24 hr tablet Take 1 tablet (150 mg total) by mouth every morning. 90 tablet 0   cyclobenzaprine (FLEXERIL) 10 MG tablet TAKE 1/2 TO 1 TABLET(5 TO 10 MG) BY MOUTH AT BEDTIME AS NEEDED 90 tablet 4   divalproex (DEPAKOTE ER) 500 MG 24 hr tablet Take 3 tablets (1,500 mg total) by mouth daily. 270 tablet 0   EPINEPHrine 0.3 mg/0.3 mL IJ SOAJ injection Inject 0.3 mg into the muscle daily as needed (for anaphylatic reactions.).  1   hydrOXYzine (ATARAX/VISTARIL) 25 MG tablet Take 25 mg by mouth at bedtime as needed for itching.  3   ipratropium (ATROVENT) 0.06 % nasal spray Place 2 sprays into both nostrils daily as needed. Allergies, runny nose.  4   levocetirizine (XYZAL) 5 MG tablet Take 5 mg by mouth at bedtime.      lithium carbonate (LITHOBID) 300 MG CR tablet Take 2 tablets (600 mg total) by mouth every evening. 180 tablet 0   methylPREDNISolone (MEDROL DOSEPAK) 4 MG TBPK tablet USE AS DIRECTED 21 each 1   Multiple Vitamins-Minerals (OPURITY BYPASS OPTIMIZED) CHEW Chew 1 tablet by mouth daily.     naratriptan (AMERGE) 2.5 MG tablet TAKE 1 TABLET BY  MOUTH FOR HEADACHE OR MIGRAINE. DO NOT EXCEED 2 DOSES OF ANY TRIPTAN IN 1 DAY 10 tablet 6   ondansetron (ZOFRAN-ODT) 4 MG disintegrating tablet Take 1 tablet (4 mg total) by mouth every 6 (six) hours as needed for nausea or vomiting. 20 tablet 11   prochlorperazine (COMPAZINE) 10 MG tablet Take 1  tablet (10 mg total) by mouth 3 (three) times daily as needed for nausea or vomiting (migraine). MAY CAUSE SEDATION. 30 tablet 3   Rimegepant Sulfate (NURTEC) 75 MG TBDP DISSOLVE 1 TABLET BY MOUTH AS CLOE TO ONSET OF MIGRAINE AS POSSIBLE. MAXIMUM DAILY DOSE IS 1 TABLET AS NEEDED FOR MIGRAINES. 8 tablet 11   SUMAtriptan (TOSYMRA) 10 MG/ACT SOLN Place 10 mg into the nose every hour as needed. 2 each 0   SUMAtriptan Succinate (ZEMBRACE SYMTOUCH) 3 MG/0.5ML SOAJ Inject 3 mg into the skin every 1 hour as needed for migraine. Max 4 injections in one day. 12 mL 11   Ubrogepant (UBRELVY) 100 MG TABS Take 100 mg by mouth every 2 (two) hours as needed. Maximum 200mg  a day. 16 tablet 11   valACYclovir (VALTREX) 500 MG tablet Take 500 mg by mouth See admin instructions. Takes 500mg  daily for 5 days when she has a flare up.  3   No current facility-administered medications for this visit.    Allergies as of 10/26/2020 - Review Complete 10/26/2020  Allergen Reaction Noted   Doxazosin Nausea And Vomiting    Morphine and related Itching 05/10/2016   Phenergan [promethazine hcl]  05/10/2016   Aloe Hives 02/18/2011   Penicillins Rash 02/18/2011   Sulfa antibiotics Rash 02/18/2011    Vitals: BP 116/75 (BP Location: Right Arm, Patient Position: Sitting)   Pulse 83   Ht 5\' 3"  (1.6 m)   Wt 157 lb (71.2 kg)   BMI 27.81 kg/m  Last Weight:  Wt Readings from Last 1 Encounters:  10/26/20 157 lb (71.2 kg)   Last Height:   Ht Readings from Last 1 Encounters:  10/26/20 5\' 3"  (1.6 m)  Exam: NAD, pleasant                  Speech:    Speech is normal; fluent and spontaneous with normal comprehension.  Cognition:     The patient is oriented to person, place, and time;     recent and remote memory intact;     language fluent;    Cranial Nerves:    The pupils are equal, round, and reactive to light.Trigeminal sensation is intact and the muscles of mastication are normal. The face is symmetric. The palate elevates in the midline. Hearing intact. Voice is normal. Shoulder shrug is normal. The tongue has normal motion without fasciculations.   Coordination:  No dysmetria  Motor Observation:    No asymmetry, no atrophy, and no involuntary movements noted. Tone:    Normal muscle tone.     Strength:    Strength is V/V in the upper and lower limbs.      Sensation: intact to LT      Assessment/Plan:  50 year old with chronic intractable migraines. Failed multiple medications, baseline was 23 total headache days a month(8 of those migraines) and botox has decreased total headache days to 7 which is > 50% improvement and also 50% improvement in remaining headache severity but still having 7 migraine days a month and we have tried and failed multiple meds  Medications tried that can be used in migraine management include: Tylenol, Zonisamide, Cataflam, nerve blocks.flexeril, depakote, zofran, gabapentin, tizanidine, scopolamine, Imitrex not effective, Maxalt chest pain, zoloft, prozac, phenergan knock her out and compazine makes her drowsy, frovatriptan,(insurance stopped) , paxil, blood pressure medications contraindicated due to her hypotension, reglan helps, Topamax, aimovig, flexeril(at night), cambia, migranal, lamictal, reglan, medrol dosepak, amerge,nurtec, zofran, compazine, tosymra, zonisamide, zembrace,  Plan:  ACUTE/EMERGENT Acute management: Ondansetron 4mg , amerge(can substitute zembrace or tosymra) , ubrelvy. Remember you can take these all again in 2 hours OR Acute: Ondansetron 4mg , amerge(an substitute zembrace or tosymra), nurtec. Can repeat Ondansetron and amerge in 2 hours     Discussed:  To prevent or relieve headaches, try the following: Cool Compress. Lie down and place a cool compress on your head.  Avoid headache triggers. If certain foods or odors seem to have triggered your migraines in the past, avoid them. A headache diary might help you identify triggers.  Include physical activity in your daily routine. Try a daily walk or other moderate aerobic exercise.  Manage stress. Find healthy ways to cope with the stressors, such as delegating tasks on your to-do list.  Practice relaxation techniques. Try deep breathing, yoga, massage and visualization.  Eat regularly. Eating regularly scheduled meals and maintaining a healthy diet might help prevent headaches. Also, drink plenty of fluids.  Follow a regular sleep schedule. Sleep deprivation might contribute to headaches Consider biofeedback. With this mind-body technique, you learn to control certain bodily functions -- such as muscle tension, heart rate and blood pressure -- to prevent headaches or reduce headache pain.    Proceed to emergency room if you experience new or worsening symptoms or symptoms do not resolve, if you have new neurologic symptoms or if headache is severe, or for any concerning symptom.   Provided education and documentation from American headache Society toolbox including articles on: chronic migraine medication overuse headache, chronic migraines, prevention of migraines, behavioral and other nonpharmacologic treatments for headache.   Sarina Ill, MD  [facility] [address] [place], [place] [address]  Phone [phone] Fax [phone]  I spent over 40 minutes of face-to-face and non-face-to-face time with patient on the  1. Migraine without aura and without status migrainosus, not intractable    diagnosis.  This included previsit chart review, lab review, study review, order entry, electronic health record documentation, patient education on the different  diagnostic and therapeutic options, counseling and coordination of care, risks and benefits of management, compliance, or risk factor reduction

## 2020-10-29 ENCOUNTER — Ambulatory Visit (INDEPENDENT_AMBULATORY_CARE_PROVIDER_SITE_OTHER): Payer: BC Managed Care – PPO | Admitting: Professional

## 2020-10-29 DIAGNOSIS — F4322 Adjustment disorder with anxiety: Secondary | ICD-10-CM | POA: Diagnosis not present

## 2020-10-29 DIAGNOSIS — F319 Bipolar disorder, unspecified: Secondary | ICD-10-CM

## 2020-11-03 NOTE — Telephone Encounter (Signed)
Received approval letter from [facility]. Letter faxed to pharmacy. Received a receipt of confirmation.

## 2020-11-04 DIAGNOSIS — H6123 Impacted cerumen, bilateral: Secondary | ICD-10-CM | POA: Diagnosis not present

## 2020-11-04 DIAGNOSIS — J310 Chronic rhinitis: Secondary | ICD-10-CM | POA: Diagnosis not present

## 2020-11-04 DIAGNOSIS — H9312 Tinnitus, left ear: Secondary | ICD-10-CM | POA: Diagnosis not present

## 2020-11-04 DIAGNOSIS — H6982 Other specified disorders of Eustachian tube, left ear: Secondary | ICD-10-CM | POA: Diagnosis not present

## 2020-11-04 DIAGNOSIS — H7202 Central perforation of tympanic membrane, left ear: Secondary | ICD-10-CM | POA: Diagnosis not present

## 2020-11-04 DIAGNOSIS — Z5181 Encounter for therapeutic drug level monitoring: Secondary | ICD-10-CM | POA: Diagnosis not present

## 2020-11-04 DIAGNOSIS — H903 Sensorineural hearing loss, bilateral: Secondary | ICD-10-CM | POA: Diagnosis not present

## 2020-11-04 DIAGNOSIS — J343 Hypertrophy of nasal turbinates: Secondary | ICD-10-CM | POA: Diagnosis not present

## 2020-11-05 LAB — CBC
Hematocrit: 38.5 % (ref 34.0–46.6)
Hemoglobin: 12.9 g/dL (ref 11.1–15.9)
MCH: 31.7 pg (ref 26.6–33.0)
MCHC: 33.5 g/dL (ref 31.5–35.7)
MCV: 95 fL (ref 79–97)
Platelets: 239 10*3/uL (ref 150–450)
RBC: 4.07 x10E6/uL (ref 3.77–5.28)
RDW: 11.6 % — ABNORMAL LOW (ref 11.7–15.4)
WBC: 4.9 10*3/uL (ref 3.4–10.8)

## 2020-11-05 LAB — COMPREHENSIVE METABOLIC PANEL
ALT: 8 IU/L (ref 0–32)
AST: 20 IU/L (ref 0–40)
Albumin/Globulin Ratio: 2.6 — ABNORMAL HIGH (ref 1.2–2.2)
Albumin: 4.5 g/dL (ref 3.8–4.8)
Alkaline Phosphatase: 60 IU/L (ref 44–121)
BUN/Creatinine Ratio: 15 (ref 9–23)
BUN: 13 mg/dL (ref 6–24)
Bilirubin Total: <0.2 mg/dL (ref 0.0–1.2)
CO2: 23 mmol/L (ref 20–29)
Calcium: 9.4 mg/dL (ref 8.7–10.2)
Chloride: 100 mmol/L (ref 96–106)
Creatinine, Ser: 0.87 mg/dL (ref 0.57–1.00)
Globulin, Total: 1.7 g/dL (ref 1.5–4.5)
Glucose: 93 mg/dL (ref 70–99)
Potassium: 4.9 mmol/L (ref 3.5–5.2)
Sodium: 137 mmol/L (ref 134–144)
Total Protein: 6.2 g/dL (ref 6.0–8.5)
eGFR: 82 mL/min/{1.73_m2} (ref >59)

## 2020-11-05 LAB — TSH: TSH: 4.69 u[IU]/mL — ABNORMAL HIGH (ref 0.450–4.500)

## 2020-11-05 LAB — LITHIUM LEVEL: Lithium Lvl: 0.8 mmol/L (ref 0.5–1.2)

## 2020-11-05 LAB — VALPROIC ACID LEVEL: Valproic Acid Lvl: 30 ug/mL — ABNORMAL LOW (ref 50–100)

## 2020-11-12 ENCOUNTER — Ambulatory Visit (HOSPITAL_BASED_OUTPATIENT_CLINIC_OR_DEPARTMENT_OTHER): Payer: BC Managed Care – PPO | Admitting: Psychiatry

## 2020-11-12 ENCOUNTER — Other Ambulatory Visit: Payer: Self-pay

## 2020-11-12 DIAGNOSIS — F319 Bipolar disorder, unspecified: Secondary | ICD-10-CM | POA: Diagnosis not present

## 2020-11-12 DIAGNOSIS — F411 Generalized anxiety disorder: Secondary | ICD-10-CM

## 2020-11-12 MED ORDER — BUPROPION HCL ER (XL) 150 MG PO TB24: 150.0000 mg | ORAL_TABLET | ORAL | 0 refills | Status: DC

## 2020-11-12 MED ORDER — DIVALPROEX SODIUM ER 500 MG PO TB24: 1500.0000 mg | ORAL_TABLET | Freq: Every day | ORAL | 0 refills | Status: DC

## 2020-11-12 MED ORDER — LITHIUM CARBONATE ER 300 MG PO TBCR: 600.0000 mg | EXTENDED_RELEASE_TABLET | Freq: Every evening | ORAL | 0 refills | Status: DC

## 2020-11-12 NOTE — Progress Notes (Signed)
Virtual Visit via Telephone Note  I connected with Margaret Lopez on 11/12/20 at 10:30 AM EDT by telephone and verified that I am speaking with the correct person using two identifiers. We were to connect by video today.  Location: Patient: home Provider: office   I discussed the limitations, risks, security and privacy concerns of performing an evaluation and management service by telephone and the availability of in person appointments. I also discussed with the patient that there may be a patient responsible charge related to this service. The patient expressed understanding and agreed to proceed.   History of Present Illness: "I'm doing ok". She is overwhelmed by the amount of things she has to do. It is makes her frustrated at times. She is a little more short tempered due to frustrations. Her anxiety is a little higher due to her hectic schedule but manageable. "I don't feel depressed". She denies anhedonia. Hanna denies SI/HI.  Pt denies recent manic and hypomanic symptoms including periods of decreased need for sleep, increased energy, mood lability, impulsivity, FOI, and excessive spending.     Observations/Objective:  General Appearance: unable to assess  Eye Contact:  unable to assess  Speech:  Clear and Coherent and Normal Rate  Volume:  Normal  Mood:  Euthymic  Affect:  Full Range  Thought Process:  Goal Directed, Linear, and Descriptions of Associations: Intact  Orientation:  Full (Time, Place, and Person)  Thought Content:  Logical  Suicidal Thoughts:  No  Homicidal Thoughts:  No  Memory:  Immediate;   Good  Judgement:  Good  Insight:  Good  Psychomotor Activity: unable to assess  Concentration:  Concentration: Good  Recall:  Good  Fund of Knowledge:  Good  Language:  Good  Akathisia:  unable to assess  Handed:  unable to assess  AIMS (if indicated):     Assets:  Communication Skills Desire for Improvement Financial  Resources/Insurance Housing Intimacy Leisure Time [place] Talents/Skills Transportation Vocational/Educational  ADL's:  unable to assess  Cognition:  WNL  Sleep:        Assessment and Plan: Depression screen [facility] 2/9 11/12/2020 08/20/2020 05/28/2020 03/26/2020 12/12/2019  Decreased Interest 0 1 0 2 0  Down, Depressed, Hopeless 0 2 0 2 0  PHQ - 2 Score 0 3 0 4 0  Altered sleeping - 1 - 0 -  Tired, decreased energy - 1 - 1 -  Change in appetite - 0 - 0 -  Feeling bad or failure about yourself  - 2 - 1 -  Trouble concentrating - 0 - 1 -  Moving slowly or fidgety/restless - 0 - 0 -  Suicidal thoughts - 0 - 0 -  PHQ-9 Score - 7 - 7 -  Difficult doing work/chores - Very difficult - Not difficult at all -  Some recent data might be hidden    [place] Office Visit from 11/12/2020 in [facility] Video Visit from 08/20/2020 in [facility] Video Visit from 05/28/2020 in [place] No Risk No Risk No Risk       I reviewed labs done on 11/04/20 and lithium 0.8 and Depakote was below 30. CBC and CMP  wnl. I encouraged her to follow up with her PCP regarding her thyroid level.   1. Bipolar 1 disorder, depressed (HCC) - buPROPion (WELLBUTRIN XL) 150 MG 24 hr tablet; Take 1 tablet (150 mg total) by mouth  every morning.  Dispense: 90 tablet; Refill: 0 - divalproex (DEPAKOTE ER) 500 MG 24 hr tablet; Take 3 tablets (1,500 mg total) by mouth daily.  Dispense: 270 tablet; Refill: 0 - lithium carbonate (LITHOBID) 300 MG CR tablet; Take 2 tablets (600 mg total) by mouth every evening.  Dispense: 180 tablet; Refill: 0  2. Generalized anxiety disorder - buPROPion (WELLBUTRIN XL) 150 MG 24 hr tablet; Take 1 tablet (150 mg total) by mouth every morning.  Dispense: 90 tablet; Refill: 0   - continue prn Xanax  Follow Up  Instructions: In 2-3 months or sooner if needed   I discussed the assessment and treatment plan with the patient. The patient was provided an opportunity to ask questions and all were answered. The patient agreed with the plan and demonstrated an understanding of the instructions.   The patient was advised to call back or seek an in-person evaluation if the symptoms worsen or if the condition fails to improve as anticipated.  I provided 12 minutes of non-face-to-face time during this encounter.   Charlcie Cradle, MD

## 2020-11-13 DIAGNOSIS — Z03818 Encounter for observation for suspected exposure to other biological agents ruled out: Secondary | ICD-10-CM | POA: Diagnosis not present

## 2020-11-13 DIAGNOSIS — J029 Acute pharyngitis, unspecified: Secondary | ICD-10-CM | POA: Diagnosis not present

## 2020-11-19 ENCOUNTER — Ambulatory Visit (INDEPENDENT_AMBULATORY_CARE_PROVIDER_SITE_OTHER): Payer: BC Managed Care – PPO | Admitting: Professional

## 2020-11-19 DIAGNOSIS — F319 Bipolar disorder, unspecified: Secondary | ICD-10-CM | POA: Diagnosis not present

## 2020-11-19 DIAGNOSIS — F4322 Adjustment disorder with anxiety: Secondary | ICD-10-CM | POA: Diagnosis not present

## 2020-12-02 ENCOUNTER — Telehealth: Payer: Self-pay | Admitting: *Deleted

## 2020-12-02 NOTE — Telephone Encounter (Signed)
Completed Roselyn Meier PA on Cover My Meds. Key: [id number]. Awaiting determination from [place].

## 2020-12-10 DIAGNOSIS — H903 Sensorineural hearing loss, bilateral: Secondary | ICD-10-CM | POA: Diagnosis not present

## 2020-12-10 DIAGNOSIS — J310 Chronic rhinitis: Secondary | ICD-10-CM | POA: Diagnosis not present

## 2020-12-10 DIAGNOSIS — H6982 Other specified disorders of Eustachian tube, left ear: Secondary | ICD-10-CM | POA: Diagnosis not present

## 2020-12-11 ENCOUNTER — Ambulatory Visit (INDEPENDENT_AMBULATORY_CARE_PROVIDER_SITE_OTHER): Payer: BC Managed Care – PPO | Admitting: Professional

## 2020-12-11 DIAGNOSIS — F4322 Adjustment disorder with anxiety: Secondary | ICD-10-CM

## 2020-12-11 DIAGNOSIS — F319 Bipolar disorder, unspecified: Secondary | ICD-10-CM

## 2020-12-15 ENCOUNTER — Ambulatory Visit: Payer: BC Managed Care – PPO | Admitting: Neurology

## 2020-12-15 ENCOUNTER — Telehealth: Payer: Self-pay | Admitting: *Deleted

## 2020-12-15 NOTE — Telephone Encounter (Signed)
Received fax denial from [facility] for Ubrelvy.  Only approved if not taken with nurtec for acute migraine management at the same time.  [phone], fax [phone].

## 2020-12-15 NOTE — Telephone Encounter (Signed)
I called pt and asked what she was taking nurtec or ubrelvy.  She said both.  I relayed at last meeting she was told several different options, per ofv note: Prevention: Discussed aimovig, try Qulipta or Ajovy or Emgality at next botox appointment   ACUTE/EMERGENT Acute management: Ondansetron 4mg , amerge(can substitute zembrace or tosymra) , ubrelvy. Remember you can take these all again in 2 hours OR Acute: Ondansetron 4mg , amerge(an substitute zembrace or tosymra), nurtec. Can repeat Ondansetron and amerge in 2 hours.   Pt has appt tomorrow for botox will discuss then.    I relayed that received denial for ubrelvy due was taking nurtec, they only approve one CGRP at a time.

## 2020-12-16 ENCOUNTER — Ambulatory Visit (INDEPENDENT_AMBULATORY_CARE_PROVIDER_SITE_OTHER): Payer: BC Managed Care – PPO | Admitting: Neurology

## 2020-12-16 DIAGNOSIS — G43709 Chronic migraine without aura, not intractable, without status migrainosus: Secondary | ICD-10-CM

## 2020-12-16 NOTE — Progress Notes (Signed)
Patient signed consent  Botox- 200 units x 1 vial Lot: [id number] Expiration:  05/2023 NDC: 1031-2811-88  Dx: Q77.373 B/B

## 2020-12-16 NOTE — Progress Notes (Signed)
Consent Form Botulism Toxin Injection For Chronic Migraine   09/15/2020: Baseline 15 headache days a month and 10 migraine days a month. Excellent response, she has > 75% reduction in migraines and headaches monthly. Discussed acute management prescribed frova, zembrace works quickly but the migraines rebound. Did great on Nurtec and we prescribed and she continues to take. Patient felt her forehead and eyebrows were uncomfortable due to inability to move them, reduced injections 1/2 injections in the corrigators and procerus, did the frontalis very high and was much better. 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 She also has temple pain.   Dry needling gave her a very bad migraine will not order that again. Aimovig gave her constipation and we stopped due to insurance. Doing so well doesn't even need to consider aimovig* at this time   Reviewed orally with patient, additionally signature is on file:  Botulism toxin has been approved by the Federal drug administration for treatment of chronic migraine. Botulism toxin does not cure chronic migraine and it may not be effective in some patients.  The administration of botulism toxin is accomplished by injecting a small amount of toxin into the muscles of the neck and head. Dosage must be titrated for each individual. Any benefits resulting from botulism toxin tend to wear off after 3 months with a repeat injection required if benefit is to be maintained. Injections are usually done every 3-4 months with maximum effect peak achieved by about 2 or 3 weeks. Botulism toxin is expensive and you should be sure of what costs you will incur resulting from the injection.  The side effects of botulism toxin use for chronic migraine may include:   -Transient, and usually mild, facial weakness with facial injections  -Transient, and usually mild, head or neck weakness with head/neck injections  -Reduction  or loss of forehead facial animation due to forehead muscle weakness  -Eyelid drooping  -Dry eye  -Pain at the site of injection or bruising at the site of injection  -Double vision  -Potential unknown long term risks  Contraindications: You should not have Botox if you are pregnant, nursing, allergic to albumin, have an infection, skin condition, or muscle weakness at the site of the injection, or have myasthenia gravis, Lambert-Eaton syndrome, or ALS.  It is also possible that as with any injection, there may be an allergic reaction or no effect from the medication. Reduced effectiveness after repeated injections is sometimes seen and rarely infection at the injection site may occur. All care will be taken to prevent these side effects. If therapy is given over a long time, atrophy and wasting in the muscle injected may occur. Occasionally the patient's become refractory to treatment because they develop antibodies to the toxin. In this event, therapy needs to be modified.  I have read the above information and consent to the administration of botulism toxin.    BOTOX PROCEDURE NOTE FOR MIGRAINE HEADACHE    Contraindications and precautions discussed with patient(above). Aseptic procedure was observed and patient tolerated procedure. Procedure performed by Dr. Georgia Dom  The condition has existed for more than 6 months, and pt does not have a diagnosis of ALS, Myasthenia Gravis or Lambert-Eaton Syndrome.  Risks and benefits of injections discussed and pt agrees to proceed with the procedure.  Written consent obtained  These injections are medically necessary. Pt  receives good benefits from these injections. These injections do not cause sedations or hallucinations which  the oral therapies may cause.  Indication/Diagnosis: chronic migraine BOTOX(J0585) injection was performed according to protocol by [employer]. 200 units of BOTOX was dissolved into 4 cc NS.   NDC:  [address]-92   Description of procedure:  The patient was placed in a sitting position. The standard protocol was used for Botox as follows, with 5 units of Botox injected at each site:   -Procerus muscle, midline injection 2.5 units   -Corrugator muscle, bilateral injection 2.5 units each  -Frontalis muscle, bilateral injection, with 2 sites each side, medial injection was performed in the upper one third of the frontalis muscle, in the region vertical from the medial inferior edge of the superior orbital rim. The lateral injection was again in the upper one third of the forehead vertically above the lateral limbus of the cornea, 1.5 cm lateral to the medial injection site.  -Temporalis muscle injection, 4 sites, bilaterally. The first injection was 3 cm above the tragus of the ear, second injection site was 1.5 cm to 3 cm up from the first injection site in line with the tragus of the ear. The third injection site was 1.5-3 cm forward between the first 2 injection sites. The fourth injection site was 1.5 cm posterior to the second injection site. .  -Occipitalis muscle injection, 3 sites, bilaterally. The first injection was done one half way between the occipital protuberance and the tip of the mastoid process behind the ear. The second injection site was done lateral and superior to the first, 1 fingerbreadth from the first injection. The third injection site was 1 fingerbreadth superiorly and medially from the first injection site.  -Cervical paraspinal muscle injection, 2 sites, bilateral knee first injection site was 1 cm from the midline of the cervical spine, 3 cm inferior to the lower border of the occipital protuberance. The second injection site was 1.5 cm superiorly and laterally to the first injection site.  -Trapezius muscle injection was performed at 3 sites, bilaterally. The first injection site was in the upper trapezius muscle halfway between the inflection point of the neck,  and the acromion. The second injection site was one half way between the acromion and the first injection site. The third injection was done between the first injection site and the inflection point of the neck.   Will return for repeat injection in 3 months.   155 unit sof Botox was used, 45 Botox was wasted. The patient tolerated the procedure well, there were no complications of the above procedure.

## 2020-12-31 ENCOUNTER — Encounter: Payer: Self-pay | Admitting: Professional

## 2020-12-31 ENCOUNTER — Ambulatory Visit (INDEPENDENT_AMBULATORY_CARE_PROVIDER_SITE_OTHER): Payer: BC Managed Care – PPO | Admitting: Professional

## 2020-12-31 DIAGNOSIS — F411 Generalized anxiety disorder: Secondary | ICD-10-CM | POA: Diagnosis not present

## 2020-12-31 DIAGNOSIS — F319 Bipolar disorder, unspecified: Secondary | ICD-10-CM | POA: Diagnosis not present

## 2020-12-31 NOTE — Progress Notes (Signed)
[facility] Counselor/Therapist Progress Note  Patient ID: Margaret Lopez, MRN: [id number],    Date: 12/31/2020  Time Spent: 25 minutes 1201-1226 pm  Treatment Type: Individual Therapy  Reported Symptoms: engaged, pleasant  Mental Status Exam: Appearance:  Neat     Behavior: Appropriate and Sharing  Motor: Normal  Speech/Language:  Clear and Coherent  Affect: Full Range  Mood: normal  Thought process: goal directed  Thought content:   WNL  Sensory/Perceptual disturbances:   WNL  Orientation: oriented to person, place, time/date, and situation  Attention: Good  Concentration: Good  Memory: WNL  Fund of knowledge:  Good  Insight:   Good  Judgment:  Good  Impulse Control: Good   Risk Assessment: Danger to Self:  No Self-injurious Behavior: No Danger to Others: No Duty to Warn:no Physical Aggression / Violence:No  Access to Firearms a concern: No  Gang Involvement:No   Subjective: This session was held via video teletherapy due to the coronavirus risk at this time. The patient consented to video teletherapy and was located at her home during this session. She is aware it is the responsibility of the patient to secure confidentiality on her end of the session. The provider was in a private home office for the duration of this session.   The patient arrived on time for her webex appointment appearing nicely groomed and easily engaged  Issues addressed: 1-medication -has been taking her medication everyday and she feels improved mood 2-financial -she is trying to reduce spending and become more intentional  -she has some money in her parents safe and they opted to get some money out to cover December expenses   -plan will be paying her and her partner back when they get their tax return   -she still has a few expenses to pay that will come out of Kevin's check at the end of the month -the overspending on vacation is what got them off track   -plan to pay  bill prior to leaving on vacation   -she thinks the error in paying the bills come from holding onto money due to fear of something else becoming more important 3-visited Kevin's parents at the beach -celebrated Christmas last weekend -youngest son Thurmond Butts accompanied them   -Thurmond Butts did not feel she was spending enough time with him   -Thurmond Butts was wanting to do it on his time     -she called him out on this "a while back"     -at the beach she went out to dinner with Thurmond Butts and had some honest conversations       -she reports he was very open       -he said he wanted to be able to talk to him about anything without fearing she will take something from him         -pt told him she was available to talk about whatever he needs     -he shared that he had sex with a girl       -pt was able to hear and provide helpful feedback         -discussed protection         -discussed importance of making good decision   -she shared with Thurmond Butts that there is nothing he can do that will cause her to stop loving   -she admits she has gotten better at taking a breath and talk about thinking about things and coming back to discuss 4-relationship with fiance Margaret Lopez -pt  feels that they are in a healthy place in their relationship -she reports they generally communicate pretty freely with minimal arguing -she reports they are closer now than they have several weeks ago   -they are being intentional to stay connected 5-challenges she is facing -she has not been doing her schoolwork as needed   -thinks it is related to balancing getting things done   -discussed strategies for ensuring that she devotes time to completing   -reports 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  Plan:  -meet again on Thursday, January 21, 2021 at [place], [facility]

## 2021-01-13 DIAGNOSIS — U071 COVID-19: Secondary | ICD-10-CM | POA: Diagnosis not present

## 2021-01-15 ENCOUNTER — Other Ambulatory Visit: Payer: Self-pay

## 2021-01-15 ENCOUNTER — Ambulatory Visit
Admission: RE | Admit: 2021-01-15 | Discharge: 2021-01-15 | Disposition: A | Discharge status: Home / Self Care | Payer: BC Managed Care – PPO | Source: Ambulatory Visit | Attending: Physician Assistant | Admitting: Physician Assistant

## 2021-01-15 ENCOUNTER — Other Ambulatory Visit: Payer: Self-pay | Admitting: Physician Assistant

## 2021-01-15 DIAGNOSIS — Z8616 Personal history of COVID-19: Secondary | ICD-10-CM | POA: Diagnosis not present

## 2021-01-15 DIAGNOSIS — R072 Precordial pain: Secondary | ICD-10-CM | POA: Diagnosis not present

## 2021-01-15 DIAGNOSIS — R0609 Other forms of dyspnea: Secondary | ICD-10-CM

## 2021-01-15 DIAGNOSIS — R0902 Hypoxemia: Secondary | ICD-10-CM | POA: Diagnosis not present

## 2021-01-15 DIAGNOSIS — R918 Other nonspecific abnormal finding of lung field: Secondary | ICD-10-CM | POA: Diagnosis not present

## 2021-01-15 DIAGNOSIS — R079 Chest pain, unspecified: Secondary | ICD-10-CM | POA: Diagnosis not present

## 2021-01-15 DIAGNOSIS — R0600 Dyspnea, unspecified: Secondary | ICD-10-CM | POA: Diagnosis not present

## 2021-01-15 MED ORDER — IOPAMIDOL (ISOVUE-370) INJECTION 76%
75.0000 mL | Freq: Once | INTRAVENOUS | Status: AC | PRN
  Administered 2021-01-15: 75 mL via INTRAVENOUS

## 2021-01-21 ENCOUNTER — Ambulatory Visit: Payer: BC Managed Care – PPO | Admitting: Professional

## 2021-02-02 ENCOUNTER — Encounter: Payer: Self-pay | Admitting: Neurology

## 2021-02-04 ENCOUNTER — Other Ambulatory Visit: Payer: Self-pay

## 2021-02-04 ENCOUNTER — Telehealth (HOSPITAL_BASED_OUTPATIENT_CLINIC_OR_DEPARTMENT_OTHER): Payer: BC Managed Care – PPO | Admitting: Psychiatry

## 2021-02-04 DIAGNOSIS — F411 Generalized anxiety disorder: Secondary | ICD-10-CM | POA: Diagnosis not present

## 2021-02-04 DIAGNOSIS — F319 Bipolar disorder, unspecified: Secondary | ICD-10-CM

## 2021-02-04 MED ORDER — LITHIUM CARBONATE ER 300 MG PO TBCR: 600.0000 mg | EXTENDED_RELEASE_TABLET | Freq: Every evening | ORAL | 0 refills | Status: DC

## 2021-02-04 MED ORDER — BUPROPION HCL ER (XL) 150 MG PO TB24: 150.0000 mg | ORAL_TABLET | ORAL | 0 refills | Status: DC

## 2021-02-04 MED ORDER — DIVALPROEX SODIUM ER 500 MG PO TB24: 1500.0000 mg | ORAL_TABLET | Freq: Every day | ORAL | 0 refills | Status: DC

## 2021-02-04 NOTE — Progress Notes (Signed)
Virtual Visit via Video Note  I connected with Margaret Lopez on 02/04/21 at 10:00 AM EST by a video enabled telemedicine application and verified that I am speaking with the correct person using two identifiers.  Location: Patient: home Provider: office   I discussed the limitations of evaluation and management by telemedicine and the availability of in person appointments. The patient expressed understanding and agreed to proceed.  History of Present Illness: "Pretty good I think". She has been taking the Wellbutrin daily and feels "normal". She is no longer tremendously stressed unless warranted. She is no longer crying. Her anxiety is situational and feels it is appropriate. She is using Xanax sparingly. She has taken it about 3 times in the last month. She is usually able to manage with coping skills. Mahogany denies recent manic and hypomanic symptoms including periods of decreased need for sleep, increased energy, mood lability, impulsivity, FOI, and excessive spending. She denies any depression symptoms in the last 3-4 weeks. Her sleep is improved. Earnest is getting 7-8 hrs/night. Her appetite is good but her taste is off due to COVID infection. She denies SI/HI.       Observations/Objective: Psychiatric Specialty Exam: ROS  There were no vitals taken for this visit.There is no height or weight on file to calculate BMI.  General Appearance: Casual and Neat  Eye Contact:  Good  Speech:  Clear and Coherent and Normal Rate  Volume:  Normal  Mood:  Euthymic  Affect:  Full Range  Thought Process:  Goal Directed, Linear, and Descriptions of Associations: Intact  Orientation:  Full (Time, Place, and Person)  Thought Content:  Logical  Suicidal Thoughts:  No  Homicidal Thoughts:  No  Memory:  Immediate;   Good  Judgement:  Good  Insight:  Good  Psychomotor Activity:  Normal  Concentration:  Concentration: Good  Recall:  Good  Fund of Knowledge:  Good  Language:  Good  Akathisia:  No   Handed:  Right  AIMS (if indicated):     Assets:  Communication Skills Desire for Improvement Financial Resources/Insurance Housing Resilience Social Support Talents/Skills Transportation Vocational/Educational  ADL's:  Intact  Cognition:  WNL  Sleep:        Assessment and Plan: Depression screen [facility] 2/9 02/04/2021 11/12/2020 08/20/2020 05/28/2020 03/26/2020  Decreased Interest 0 0 1 0 2  Down, Depressed, Hopeless 0 0 2 0 2  PHQ - 2 Score 0 0 3 0 4  Altered sleeping - - 1 - 0  Tired, decreased energy - - 1 - 1  Change in appetite - - 0 - 0  Feeling bad or failure about yourself  - - 2 - 1  Trouble concentrating - - 0 - 1  Moving slowly or fidgety/restless - - 0 - 0  Suicidal thoughts - - 0 - 0  PHQ-9 Score - - 7 - 7  Difficult doing work/chores - - Very difficult - Not difficult at all  Some recent data might be hidden    Flowsheet Row Video Visit from 02/04/2021 in [facility] Office Visit from 11/12/2020 in [facility] Video Visit from 08/20/2020 in [facility] No Risk No Risk No Risk      1. Bipolar 1 disorder, depressed (HCC) - buPROPion (WELLBUTRIN XL) 150 MG 24 hr tablet; Take 1 tablet (150 mg total) by mouth every morning.  Dispense: 90 tablet; Refill: 0 - divalproex (DEPAKOTE ER) 500 MG  24 hr tablet; Take 3 tablets (1,500 mg total) by mouth daily.  Dispense: 270 tablet; Refill: 0 - lithium carbonate (LITHOBID) 300 MG CR tablet; Take 2 tablets (600 mg total) by mouth every evening.  Dispense: 180 tablet; Refill: 0  2. Generalized anxiety disorder - buPROPion (WELLBUTRIN XL) 150 MG 24 hr tablet; Take 1 tablet (150 mg total) by mouth every morning.  Dispense: 90 tablet; Refill: 0  - no refill on Xanax - labs at next visit   Follow Up Instructions: In 3 months or sooner if needed   I discussed the assessment and treatment  plan with the patient. The patient was provided an opportunity to ask questions and all were answered. The patient agreed with the plan and demonstrated an understanding of the instructions.   The patient was advised to call back or seek an in-person evaluation if the symptoms worsen or if the condition fails to improve as anticipated.  I provided 10 minutes of non-face-to-face time during this encounter.   Charlcie Cradle, MD

## 2021-02-09 ENCOUNTER — Encounter: Payer: Self-pay | Admitting: Neurology

## 2021-02-11 ENCOUNTER — Ambulatory Visit (INDEPENDENT_AMBULATORY_CARE_PROVIDER_SITE_OTHER): Payer: BC Managed Care – PPO | Admitting: Professional

## 2021-02-11 ENCOUNTER — Ambulatory Visit: Payer: BC Managed Care – PPO | Admitting: Family Medicine

## 2021-02-11 ENCOUNTER — Encounter: Payer: Self-pay | Admitting: Professional

## 2021-02-11 DIAGNOSIS — F319 Bipolar disorder, unspecified: Secondary | ICD-10-CM | POA: Diagnosis not present

## 2021-02-11 DIAGNOSIS — F411 Generalized anxiety disorder: Secondary | ICD-10-CM | POA: Diagnosis not present

## 2021-02-11 NOTE — Progress Notes (Addendum)
[place] Counselor/Therapist Progress Note  Patient ID: MERYL HUBERS, MRN: [id number],    Date: 02/11/2021  Time Spent: 31 minutes 1103-1134 pm  Treatment Type: Individual Therapy  Reported Symptoms: engaged, pleasant, denies feeling depressed, some moderate anxiety but controlling with coping-has not used her prn Xanax  Mental Status Exam: Appearance:  Neat     Behavior: Appropriate and Sharing  Motor: Normal  Speech/Language:  Clear and Coherent  Affect: Full Range  Mood: normal  Thought process: goal directed  Thought content:   WNL  Sensory/Perceptual disturbances:   WNL  Orientation: oriented to person, place, time/date, and situation  Attention: Good  Concentration: Good  Memory: WNL  Fund of knowledge:  Good  Insight:   Good  Judgment:  Good  Impulse Control: Good   Risk Assessment: Danger to Self:  No Self-injurious Behavior: No Danger to Others: No Duty to Warn:no Physical Aggression / Violence:No  Access to Firearms a concern: No  Gang Involvement:No   Subjective: This session was held via video teletherapy due to the coronavirus risk at this time. The patient consented to video teletherapy and was located at her home during this session. She is aware it is the responsibility of the patient to secure confidentiality on her end of the session. The provider was in a private home office for the duration of this session.   The patient arrived late for her webex appointment due to connectivity issues.  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   -she has  discussed her need for mor time to reduce possibility of recoupments -she is feeling frustrated with compensation and management   -she ultimately has to fix the mistakes of our employees because she is the senior employee though she is not compensated financially or with reduced work load -she has opted to begin looking for new employment opportunities 4-self-care -pt verbalized critical statement about herself and was redirected -pt wants to get herself back on track with staying on top of her home and her nutrition  Interventions: Problems Addressed  Anxiety, Body Image Disturbance/Eating Disorders, Balancing Work and Family/Multiple Roles, Low Self-Esteem/Lack of Assertiveness  Goals 1. Address and eliminate maladaptive thought processes that lead to anxious responses. Objective Identify the relationship between multiple roles, role overload, role strain, and anxiety reactions. Target Date: 2021-08-05 Frequency: Weekly  Progress: 0 Modality: individual  Objective Learn and practice methods of reducing anxiety in a variety of situations. Target Date: 2021-08-05 Frequency: Weekly  Progress: 60 Modality: individual  Objective Self-correct maladaptive thought patterns preemptively in order to lessen or eliminate anxiety at least 90% of the time; increase positive self-talk. Target Date: 2021-08-05 Frequency: Weekly  Progress: 60 Modality: individual  Related Interventions Collaborate with the client to determine what kind of changes need to be made, internally and externally, for calmness and satisfaction to exist within the arenas of work, home, and school. Objective Verbalize the degree to which current life circumstances could be improved in areas such as work, home, and school. Target Date: 2021-08-05 Frequency: Weekly  Progress: 70 Modality: individual  Objective Acknowledge underlying irrational or illogical thought patterns that contribute to anxiety. Target Date: 2021-08-05  Frequency: Weekly  Progress: 80 Modality: individual  2. Change the definition of self to encompass positive attributes beyond body weight, size, and shape. 3. Develop a realistic perspective regarding demands and obligations of multiple roles and their completion. 4. Develop an awareness of internalized unrealistic and narrow standards of women's beauty and learn to challenge such beliefs. Objective Increase the number of realistic, positive messages regarding eating, food, and body size to at least three per day. Target Date: 2021-08-05 Frequency: Weekly  Progress: 0 Modality: individual  Objective Identify and change distorted self-talk messages associated with eating behavior. Target Date: 2021-08-05 Frequency: Weekly  Progress: 40 Modality: individual  Related Interventions Challenge the client's basis for construing herself in terms of shape so the concept of self goes beyond body weight; help her define herself in terms of relationships with family, friends, and spirituality, and in terms of accomplishments, traits, and abilities. Assist the client in developing internal standards of approval and reduce the need for external approval by brainstorming her positive attributes and qualities (e.g., personality, values, behavior). Objective Verbalize acceptance of control, self-determination, and responsibility for one's body image and eating behavior. Target Date: 2021-08-05 Frequency: Weekly  Progress: 40 Modality: individual  Objective Keep a journal of daily incidents of behaviors demonstrating body image preoccupation. Target Date: 2021-08-05 Frequency: Weekly  Progress: 0 Modality: individual  Related Interventions Reinforce positive affirmations, imagery, mirror work, drawing or sculpting one's body, and provide feedback to help correct body image distortions. Objective Implement three new healthy and appropriate strategies to cope with stress, depression, and anxiety rather than  using unhealthy eating patterns. Target Date: 2021-08-05 Frequency: Weekly  Progress: 100 Modality: individual  5. Develop effective coping strategies to deal with interpersonal stressors and emotional issues. 6. Develop time management strategies to reduce role overload and role conflicts. 7. Eliminate depressive and anxiety symptoms associated with trying to balance multiple roles. 8. Enhance ability to handle effectively life stressors. 9. Improve self-esteem and develop a positive self-image as capable and competent. 10. Increase ability to express needs and desires openly and honestly. 11. Increase assertiveness skills and ability to advocate for self. 12. Increase involvement in activities that foster confidence and a sense of accomplishment. 13. Increase openness to experiences and opportunities associated with risk-taking. Objective Identify three roadblocks to improved self-esteem. Target Date: 2021-08-05 Frequency: Weekly  Progress: 30 Modality: individual  Objective Acknowledge self-disparaging statements and recognize the tendency to engage in such statements. Target Date: 2021-08-05 Frequency: Weekly  Progress: 70 Modality: individual  Objective Identify three anxiety-related fears associated with being assertive. Target Date: 2021-08-05 Frequency: Weekly  Progress: 0 Modality: individual  Related Interventions Assist the client with identifying gender role messages that are empowering and those that are limiting to her self-worth and assertiveness; facilitate her identifying and developing her own values. Objective Verbalize an understanding of the differences between passive, assertive, and aggressive behavior. Target Date: 2021-08-05 Frequency: Weekly  Progress: 70 Modality: individual  Related Interventions Assist the client in identifying negative beliefs about herself. Assist the client in becoming aware of how she indirectly expresses negative feelings about self  (e.g., lack of eye contact, social withdrawal, sweating, expectations of failure or rejection). Use cognitive-restructuring techniques to change the client's belief system (i.e., via reality-testing and rational thought) toward a more positive, realistic self-perception. Encourage participation in physical activities that foster confidence and well-being (e.g., yoga, meditation, martial arts). Explore the client's  fears of being assertive (e.g., fear of rejection, fear of ineffectiveness, fear of ridicule). Explore areas of competency with the client and encourage her to engage in related activities (e.g., dance, singing, arts, volunteer work, joining a book club). Explore with the client avenues for receiving emotional support from others; connect her with further services such as women's groups when necessary. Obtain feedback on any difficulties the client experiences in application and provide tips for integrating methods into his/her everyday routine. Explore with the client her expectations regarding the balance between work and family, and discuss how this relates to internalized gender role stereotypes. Assign the client to conduct family meetings regularly (e.g., possibly biweekly or monthly) with children (ages 71 and up) and partner to delegate household responsibilities. Use behavioral techniques (i.e., education, modeling, role-playing, corrective feedback, positive reinforcement) to teach the couple problem-solving and conflict-resolution skills, including defining the problem constructively and specifically, brainstorming options, evaluating options, compromise, choosing options and implementing a plan, and evaluating the results. Assist the client in developing more adaptive strategies and behaviors (e.g., relaxation, read a book, call a friend) to cope with uncomfortable emotional states. Encourage the client to develop competencies in other areas of life (e.g., sports, hobbies, educational  activities) to increase feelings of self-worth and self-esteem. Assist the client in identifying cues that lead to unhealthy eating by assigning her to write a daily journal of eating behavior, thoughts, and feelings. Objective Decrease the frequency of making self-disparaging remarks. Target Date: 2021-08-05 Frequency: Weekly  Progress: 70 Modality: individual  Related Interventions Encourage the client to make at least one positive statement about herself during the therapy session. Assist the client in developing a list of positive affirmations to be read three times a day or to be put on a mirror at home. Ask the client to make a list including positive qualities about herself and accomplishments; verbally reinforce positive self-statements. 14. Increase overall sense of well-being via reduction/elimination of anxiety. 15. Maintain a balance between the multiple demands of motherhood, work, and other life roles and responsibilities. Objective Generate a list of self-care activities and make a commitment to regularly participate in such activities. Target Date: 2021-08-05 Frequency: Weekly  Progress: 0 Modality: individual  Objective Communicate needs with partner regarding multiple role obligations. Target Date: 2021-08-05 Frequency: Weekly  Progress: 50 Modality: individual  Objective Identify at least five practical ways to manage stress associated with multiple demands. Target Date: 2021-08-05 Frequency: Weekly  Progress: 60 Modality: individual  Objective Learn and implement stress management and relaxation techniques to reduce fatigue, anxiety, and depressive symptoms. Target Date: 2021-08-05 Frequency: Weekly  Progress: 40 Modality: individual  16. Reduce self-disparaging remarks and negative self-talk.  Diagnosis:Bipolar 1 disorder, depressed ([place])  Generalized anxiety disorder  Plan:  -meet again on Thursday, March 25, 2021 at [place], [place], [facility]

## 2021-02-18 ENCOUNTER — Telehealth: Payer: Self-pay | Admitting: Neurology

## 2021-02-18 NOTE — Telephone Encounter (Signed)
Completed [employer] continuation form FOR Botox, placed in Nurse Pod for MD signature.

## 2021-02-18 NOTE — Telephone Encounter (Signed)
Faxed signed PA form with OV notes to [employer]. ?

## 2021-02-25 NOTE — Telephone Encounter (Signed)
Received approval from [facility]. Reference # [id number] (02/18/2021-01/19/2022).

## 2021-03-16 ENCOUNTER — Ambulatory Visit: Payer: BC Managed Care – PPO | Admitting: Neurology

## 2021-03-16 DIAGNOSIS — G43709 Chronic migraine without aura, not intractable, without status migrainosus: Secondary | ICD-10-CM | POA: Diagnosis not present

## 2021-03-16 MED ORDER — AIMOVIG 140 MG/ML ~~LOC~~ SOAJ: 140.0000 mg | SUBCUTANEOUS | 11 refills | Status: DC

## 2021-03-16 NOTE — Progress Notes (Signed)
Botox- 200 units x 1 vial ?Lot: [id number] ?Expiration: 04/2023 ?[place]: [phone] ? ?Bacteriostatic 0.9% Sodium Chloride- 62m total ?Lot: [id number]?Expiration: 08/11/2022 ?[place] [id number]? ?[place]x: GZ22.482?B/B ?

## 2021-03-16 NOTE — Progress Notes (Signed)
? ?Consent Form ?Botulism Toxin Injection For Chronic Migraine ? ? ? ? ?03/16/2021: She had to put her dog down at [place] was 16. She had a very bad migraine.  When she has a migraine we use Ubrelvy. Try getting her aimovig ? ?09/15/2020: Baseline 15 headache days a month and 10 migraine days a month. Excellent response, she has > 75% reduction in migraines and headaches monthly. Discussed acute management prescribed frova, zembrace works quickly but the migraines rebound. Did great on Nurtec and we prescribed and she continues to take. Patient felt her forehead and eyebrows were uncomfortable due to inability to move them, reduced injections 1/2 injections in the corrigators and procerus, did the frontalis very high and was much better. 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 She also has temple pain.   Dry needling gave her a very bad migraine will not order that again. Aimovig gave her constipation and we stopped due to insurance. Doing so well doesn't even need to consider aimovig* at this time ? ? ?Reviewed orally with patient, additionally signature is on file: ? ?Botulism toxin has been approved by the Federal drug administration for treatment of chronic migraine. Botulism toxin does not cure chronic migraine and it may not be effective in some patients. ? ?The administration of botulism toxin is accomplished by injecting a small amount of toxin into the muscles of the neck and head. Dosage must be titrated for each individual. Any benefits resulting from botulism toxin tend to wear off after 3 months with a repeat injection required if benefit is to be maintained. Injections are usually done every 3-4 months with maximum effect peak achieved by about 2 or 3 weeks. Botulism toxin is expensive and you should be sure of what costs you will incur resulting from the injection. ? ?The side effects of botulism toxin use for chronic migraine may  include: ? ? -Transient, and usually mild, facial weakness with facial injections ? -Transient, and usually mild, head or neck weakness with head/neck injections ? -Reduction or loss of forehead facial animation due to forehead muscle weakness ? -Eyelid drooping ? -Dry eye ? -Pain at the site of injection or bruising at the site of injection ? -Double vision ? -Potential unknown long term risks ? ?Contraindications: You should not have Botox if you are pregnant, nursing, allergic to albumin, have an infection, skin condition, or muscle weakness at the site of the injection, or have myasthenia gravis, Lambert-Eaton syndrome, or ALS. ? ?It is also possible that as with any injection, there may be an allergic reaction or no effect from the medication. Reduced effectiveness after repeated injections is sometimes seen and rarely infection at the injection site may occur. All care will be taken to prevent these side effects. If therapy is given over a long time, atrophy and wasting in the muscle injected may occur. Occasionally the patient's become refractory to treatment because they develop antibodies to the toxin. In this event, therapy needs to be modified. ? ?I have read the above information and consent to the administration of botulism toxin. ? ? ? ?BOTOX PROCEDURE NOTE FOR MIGRAINE HEADACHE ? ? ? ?Contraindications and precautions discussed with patient(above). Aseptic procedure was observed and patient tolerated procedure. Procedure performed by Dr. Georgia Dom ? ?The condition has existed for more than 6 months, and pt does not have a diagnosis of ALS, Myasthenia Gravis or Lambert-Eaton Syndrome.  Risks and benefits of injections  discussed and pt agrees to proceed with the procedure.  Written consent obtained ? ?These injections are medically necessary. Pt  receives good benefits from these injections. These injections do not cause sedations or hallucinations which the oral therapies may  cause. ? ?Indication/Diagnosis: chronic migraine ?MZTAE(W2574) injection was performed according to protocol by [employer]. 200 units of BOTOX was dissolved into 4 cc NS.   ?[place]: [phone] ? ? ?Description of procedure: ? ?The patient was placed in a sitting position. The standard protocol was used for Botox as follows, with 5 units of Botox injected at each site: ? ? ?-Procerus muscle, midline injection 2.5 units  ? ?-Corrugator muscle, bilateral injection 2.5 units each ? ?-Frontalis muscle, bilateral injection, with 2 sites each side, medial injection was performed in the upper one third of the frontalis muscle, in the region vertical from the medial inferior edge of the superior orbital rim. The lateral injection was again in the upper one third of the forehead vertically above the lateral limbus of the cornea, 1.5 cm lateral to the medial injection site. ? ?-Temporalis muscle injection, 4 sites, bilaterally. The first injection was 3 cm above the tragus of the ear, second injection site was 1.5 cm to 3 cm up from the first injection site in line with the tragus of the ear. The third injection site was 1.5-3 cm forward between the first 2 injection sites. The fourth injection site was 1.5 cm posterior to the second injection site. . ? ?-Occipitalis muscle injection, 3 sites, bilaterally. The first injection was done one half way between the occipital protuberance and the tip of the mastoid process behind the ear. The second injection site was done lateral and superior to the first, 1 fingerbreadth from the first injection. The third injection site was 1 fingerbreadth superiorly and medially from the first injection site. ? ?-Cervical paraspinal muscle injection, 2 sites, bilateral knee first injection site was 1 cm from the midline of the cervical spine, 3 cm inferior to the lower border of the occipital protuberance. The second injection site was 1.5 cm superiorly and laterally to the first injection  site. ? ?-Trapezius muscle injection was performed at 3 sites, bilaterally. The first injection site was in the upper trapezius muscle halfway between the inflection point of the neck, and the acromion. The second injection site was one half way between the acromion and the first injection site. The third injection was done between the first injection site and the inflection point of the neck. ? ? ?Will return for repeat injection in 3 months. ? ? ?155 unit sof Botox was used, 45 Botox was wasted. The patient tolerated the procedure well, there were no complications of the above procedure. ? ? ? ?

## 2021-03-24 ENCOUNTER — Telehealth: Payer: Self-pay | Admitting: *Deleted

## 2021-03-24 NOTE — Telephone Encounter (Signed)
Received a PA for Aimovig however on the PA it states that if the PA is approved it will terminate her Botox approval. Sent a message to the pt.  ?

## 2021-03-25 ENCOUNTER — Ambulatory Visit (INDEPENDENT_AMBULATORY_CARE_PROVIDER_SITE_OTHER): Payer: BC Managed Care – PPO | Admitting: Professional

## 2021-03-25 ENCOUNTER — Encounter: Payer: Self-pay | Admitting: Professional

## 2021-03-25 DIAGNOSIS — F411 Generalized anxiety disorder: Secondary | ICD-10-CM

## 2021-03-25 DIAGNOSIS — F319 Bipolar disorder, unspecified: Secondary | ICD-10-CM

## 2021-03-25 NOTE — Progress Notes (Signed)
?  [facility] Counselor/Therapist Progress Note ? ?Patient ID: Margaret Lopez, MRN: [id number],   ? ?Date: 03/25/2021 ? ?Time Spent: 41 minutes 1103-1144 pm ? ?Treatment Type: Individual Therapy ? ?Risk Assessment: ?Danger to Self:  No ?Self-injurious Behavior: No ?Danger to Others: No ? ?Subjective: This session was held via video teletherapy due to the coronavirus risk at this time. The patient consented to video teletherapy and was located at her home during this session. She is aware it is the responsibility of the patient to secure confidentiality on her end of the session. The provider was in a private home office for the duration of this session.  ? ?The patient arrived late for her webex appointment due to connectivity issues. ? ?Issues addressed: ?1-schedule ?-cannot get everything done ?-Logan pushes her button in the morning and she wants to wait until he leaves to get up ?-updated schedule to accommodate change in spouse's schedule ?2-work ?-she is very stretched on her work and is pulled ?4-self-care ?-pt admit that she sometimes does not have time to take a bath ?-she has gained some weight but was too thin ?-she admits that going back to school has placed a lot of pressure since deciding to go back to school ?  -worries that people will believe she will not finish ?  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  -he is marking all over the house and he is ruining things ?7-anxiety ?-running wild lately and doesn't know why ?-does not take her prn anti-anxiety medication ?-son is not working since he was fired several month ago due to violating hipaa by having phone in restricted place ? ?Interventions: Problems  Addressed  ?Anxiety, Body Image Disturbance/Eating Disorders, Balancing Work and Family/Multiple Roles, Low Self-Esteem/Lack of Assertiveness  ?Goals ?1. Address and eliminate maladaptive thought processes that lead to anxious responses. ?Objective ?Identify the relationship between multiple roles, role overload, role strain, and anxiety reactions. ?Target Date: 2021-08-05 Frequency: Weekly  ?Progress: 0 Modality: individual  ?Objective ?Learn and practice methods of reducing anxiety in a variety of situations. ?Target Date: 2021-08-05 Frequency: Weekly  ?Progress: 60 Modality: individual  ?Objective ?Self-correct maladaptive thought patterns preemptively in order to lessen or eliminate anxiety at least 90% of the time; increase positive self-talk. ?Target Date: 2021-08-05 Frequency: Weekly  ?Progress: 60 Modality: individual  ?Related Interventions ?Collaborate with the client to determine what kind of changes need to be made, internally and externally, for calmness and satisfaction to exist within the arenas of work, home, and school. ?Objective ?Verbalize the degree to which current life circumstances could be improved in areas such as work, home, and school. ?Target Date: 2021-08-05 Frequency: Weekly  ?Progress: 70 Modality: individual  ?Objective ?Acknowledge underlying irrational or illogical thought patterns that contribute to anxiety. ?Target Date: 2021-08-05 Frequency: Weekly  ?Progress: 80 Modality: individual  ?2. Change the definition of self to encompass positive attributes beyond body weight, size, and shape. ?3. Develop a realistic perspective regarding demands and obligations of multiple roles and their completion. ?4. Develop an awareness of internalized unrealistic and narrow standards of women's beauty and learn to challenge such beliefs. ?Objective ?Increase the number of realistic, positive messages regarding eating, food,  and body size to at least three per day. ?Target Date: 2021-08-05  Frequency: Weekly  ?Progress: 0 Modality: individual  ?Objective ?Identify and change distorted self-talk messages associated with eating behavior. ?Target Date: 2021-08-05 Frequency: Weekly  ?Progress: 40 Modality: individual  ?Related Interventions ?Challenge the client's basis for construing herself in terms of shape so the concept of self goes beyond body weight; help her define herself in terms of relationships with family, friends, and spirituality, and in terms of accomplishments, traits, and abilities. ?Assist the client in developing internal standards of approval and reduce the need for external approval by brainstorming her positive attributes and qualities (e.g., personality, values, behavior). ?Objective ?Verbalize acceptance of control, self-determination, and responsibility for one's body image and eating behavior. ?Target Date: 2021-08-05 Frequency: Weekly  ?Progress: 40 Modality: individual  ?Objective ?Keep a journal of daily incidents of behaviors demonstrating body image preoccupation. ?Target Date: 2021-08-05 Frequency: Weekly  ?Progress: 0 Modality: individual  ?Related Interventions ?Reinforce positive affirmations, imagery, mirror work, drawing or sculpting one's body, and provide feedback to help correct body image distortions. ?Objective ?Implement three new healthy and appropriate strategies to cope with stress, depression, and anxiety rather than using unhealthy eating patterns. ?Target Date: 2021-08-05 Frequency: Weekly  ?Progress: 100 Modality: individual  ?5. Develop effective coping strategies to deal with interpersonal stressors and emotional issues. ?6. Develop time management strategies to reduce role overload and role conflicts. ?7. Eliminate depressive and anxiety symptoms associated with trying to balance multiple roles. ?8. Enhance ability to handle effectively life stressors. ?9. Improve self-esteem and develop a positive self-image as capable and competent. ?10. Increase  ability to express needs and desires openly and honestly. ?11. Increase assertiveness skills and ability to advocate for self. ?12. Increase involvement in activities that foster confidence and a sense of accomplishment. ?13. Increase openness to experiences and opportunities associated with risk-taking. ?Objective ?Identify three roadblocks to improved self-esteem. ?Target Date: 2021-08-05 Frequency: Weekly  ?Progress: 30 Modality: individual  ?Objective ?Acknowledge self-disparaging statements and recognize the tendency to engage in such statements. ?Target Date: 2021-08-05 Frequency: Weekly  ?Progress: 70 Modality: individual  ?Objective ?Identify three anxiety-related fears associated with being assertive. ?Target Date: 2021-08-05 Frequency: Weekly  ?Progress: 0 Modality: individual  ?Related Interventions ?Assist the client with identifying gender role messages that are empowering and those that are limiting to her self-worth and assertiveness; facilitate her identifying and developing her own values. ?Objective ?Verbalize an understanding of the differences between passive, assertive, and aggressive behavior. ?Target Date: 2021-08-05 Frequency: Weekly  ?Progress: 70 Modality: individual  ?Related Interventions ?Assist the client in identifying negative beliefs about herself. ?Assist the client in becoming aware of how she indirectly expresses negative feelings about self (e.g., lack of eye contact, social withdrawal, sweating, expectations of failure or rejection). ?Use cognitive-restructuring techniques to change the client's belief system (i.e., via reality-testing and rational thought) toward a more positive, realistic self-perception. ?Encourage participation in physical activities that foster confidence and well-being (e.g., yoga, meditation, martial arts). ?Explore the client's fears of being assertive (e.g., fear of rejection, fear of ineffectiveness, fear of ridicule). ?Explore areas of competency with  the client and encourage her to engage in related activities (e.g., dance, singing, arts, volunteer work, joining a book club). ?Explore with the client avenues for receiving emotional support from others; conne

## 2021-04-15 ENCOUNTER — Encounter: Payer: Self-pay | Admitting: Professional

## 2021-04-15 ENCOUNTER — Encounter (INDEPENDENT_AMBULATORY_CARE_PROVIDER_SITE_OTHER): Payer: BC Managed Care – PPO | Admitting: Professional

## 2021-04-15 NOTE — Progress Notes (Signed)
This encounter was created in error - please disregard.               Kaiesha Tonner, LCMHC 

## 2021-04-28 DIAGNOSIS — J4541 Moderate persistent asthma with (acute) exacerbation: Secondary | ICD-10-CM | POA: Diagnosis not present

## 2021-04-29 ENCOUNTER — Telehealth (HOSPITAL_BASED_OUTPATIENT_CLINIC_OR_DEPARTMENT_OTHER): Payer: BC Managed Care – PPO | Admitting: Psychiatry

## 2021-04-29 DIAGNOSIS — F411 Generalized anxiety disorder: Secondary | ICD-10-CM | POA: Diagnosis not present

## 2021-04-29 DIAGNOSIS — F319 Bipolar disorder, unspecified: Secondary | ICD-10-CM

## 2021-04-29 MED ORDER — DIVALPROEX SODIUM ER 500 MG PO TB24
1500.0000 mg | ORAL_TABLET | Freq: Every day | ORAL | 0 refills | Status: DC
Start: 2021-04-29 — End: 2021-07-15

## 2021-04-29 MED ORDER — LITHIUM CARBONATE ER 300 MG PO TBCR: 600.0000 mg | EXTENDED_RELEASE_TABLET | Freq: Every evening | ORAL | 0 refills | Status: DC

## 2021-04-29 MED ORDER — BUPROPION HCL ER (XL) 150 MG PO TB24: 150.0000 mg | ORAL_TABLET | ORAL | 0 refills | Status: DC

## 2021-04-29 NOTE — Progress Notes (Signed)
Virtual Visit via Video Note ? ?I connected with Margaret Lopez on 04/29/21 at 10:30 AM EDT by a video enabled telemedicine application and verified that I am speaking with the correct person using two identifiers. ? ?Location: ?Patient: home ?Provider: office ?  ?I discussed the limitations of evaluation and management by telemedicine and the availability of in person appointments. The patient expressed understanding and agreed to proceed. ? ?History of Present Illness: ?"I'm pretty well I think". She is enjoying the longer sunny days but is having seasonal allergies. Despite that she still tries to get out more. Her depression and anxiety have been "in check". She got to the point where she was tried of dealing with numerous stressors and has decided to move on. She is evaluating the relationships in her life and trying to decide if they are worth continuing.  Her mood has been "funk mood" 3-4 days/week. She is not sure if it is depression because it feels different than depressive episodes in the past. Her work remains stressful. At home her cars need repair, getting electricity fixed and dealing with her boyfriend's son are stressed. Margaret Lopez has noticed it is harder for her to enjoy things. When overwhelmed she feels helpless. Margaret Lopez denies SI/HI. Her sleep, appetite and concentrations are good. Her energy is on the low side.  ?  ?Observations/Objective: ?Psychiatric Specialty Exam: ?ROS  ?There were no vitals taken for this visit.There is no height or weight on file to calculate BMI.  ?General Appearance: Casual and Neat  ?Eye Contact:  Good  ?Speech:  Clear and Coherent and Normal Rate  ?Volume:  Normal  ?Mood:  Anxious and Depressed  ?Affect:  Full Range  ?Thought Process:  Goal Directed, Linear, and Descriptions of Associations: Intact  ?Orientation:  Full (Time, Place, and Person)  ?Thought Content:  Logical  ?Suicidal Thoughts:  No  ?Homicidal Thoughts:  No  ?Memory:  Immediate;   Good  ?Judgement:  Good   ?Insight:  Good  ?Psychomotor Activity:  Normal  ?Concentration:  Concentration: Good  ?Recall:  Good  ?Fund of Knowledge:  Good  ?Language:  Good  ?Akathisia:  No  ?Handed:  Right  ?AIMS (if indicated):     ?Assets:  Communication Skills ?Desire for Improvement ?Financial Resources/Insurance ?Housing ?Intimacy ?Leisure Time ?Resilience ?Social Support ?Talents/Skills ?Transportation ?Vocational/Educational  ?ADL's:  Intact  ?Cognition:  WNL  ?Sleep:     ? ? ? ?Assessment and Plan: ? ? ?  04/29/2021  ? 10:42 AM 02/04/2021  ? 10:12 AM 11/12/2020  ? 10:43 AM 08/20/2020  ?  1:42 PM 05/28/2020  ?  1:08 PM  ?Depression screen PHQ 2/9  ?Decreased Interest 1 0 0 1 0  ?Down, Depressed, Hopeless 1 0 0 2 0  ?PHQ - 2 Score 2 0 0 3 0  ?Altered sleeping 0   1   ?Tired, decreased energy 1   1   ?Change in appetite 0   0   ?Feeling bad or failure about yourself  0   2   ?Trouble concentrating 0   0   ?Moving slowly or fidgety/restless 0   0   ?Suicidal thoughts 0   0   ?PHQ-9 Score 3   7   ?Difficult doing work/chores Somewhat difficult   Very difficult   ? ? ?Flowsheet Row Video Visit from 04/29/2021 in [facility] Video Visit from 02/04/2021 in [facility] Office Visit from 11/12/2020 in [facility]  ?C-SSRS RISK CATEGORY No Risk No Risk No Risk  ? ?  ? ? ? ? ? ?Status of current problems: ongoing but manageable ? ?Meds: Margaret Lopez wants to continue her meds and not make changes at this time. ? ? ?Margaret Lopez states she does not need a refill on Xanax today. ? ?1. Bipolar 1 disorder, depressed (HCC) ?- buPROPion (WELLBUTRIN XL) 150 MG 24 hr tablet; Take 1 tablet (150 mg total) by mouth every morning.  Dispense: 90 tablet; Refill: 0 ?- divalproex (DEPAKOTE ER) 500 MG 24 hr tablet; Take 3 tablets (1,500 mg total) by mouth daily.  Dispense: 270 tablet; Refill: 0 ?- lithium carbonate (LITHOBID) 300 MG CR tablet; Take 2 tablets (600  mg total) by mouth every evening.  Dispense: 180 tablet; Refill: 0 ? ?2. Generalized anxiety disorder ?- buPROPion (WELLBUTRIN XL) 150 MG 24 hr tablet; Take 1 tablet (150 mg total) by mouth every morning.  Dispense: 90 tablet; Refill: 0 ? ? ? ? ?Labs: will order at next visit  ? ? ?Therapy: brief supportive therapy provided. Discussed psychosocial stressors in detail.   ? ? ? ?Collaboration of Care: Other none today ? ?Patient/Guardian was advised Release of Information must be obtained prior to any record release in order to collaborate their care with an outside provider. Patient/Guardian was advised if they have not already done so to contact the registration department to sign all necessary forms in order for [name] to release information regarding their care.  ? ?Consent: Patient/Guardian gives verbal consent for treatment and assignment of benefits for services provided during this visit. Patient/Guardian expressed understanding and agreed to proceed.  ? ? ? ?Follow Up Instructions: ?Follow up in 2-3 months or sooner if needed ?  ? ?I discussed the assessment and treatment plan with the patient. The patient was provided an opportunity to ask questions and all were answered. The patient agreed with the plan and demonstrated an understanding of the instructions. ?  ?The patient was advised to call back or seek an in-person evaluation if the symptoms worsen or if the condition fails to improve as anticipated. ? ?I provided 14 minutes of non-face-to-face time during this encounter. ? ? ?Margaret Cradle, MD ? ?

## 2021-05-04 DIAGNOSIS — R052 Subacute cough: Secondary | ICD-10-CM | POA: Diagnosis not present

## 2021-05-04 DIAGNOSIS — J3089 Other allergic rhinitis: Secondary | ICD-10-CM | POA: Diagnosis not present

## 2021-05-04 DIAGNOSIS — H1045 Other chronic allergic conjunctivitis: Secondary | ICD-10-CM | POA: Diagnosis not present

## 2021-05-04 DIAGNOSIS — J301 Allergic rhinitis due to pollen: Secondary | ICD-10-CM | POA: Diagnosis not present

## 2021-05-04 DIAGNOSIS — J3081 Allergic rhinitis due to animal (cat) (dog) hair and dander: Secondary | ICD-10-CM | POA: Diagnosis not present

## 2021-05-06 ENCOUNTER — Encounter: Payer: Self-pay | Admitting: Professional

## 2021-05-06 ENCOUNTER — Ambulatory Visit (INDEPENDENT_AMBULATORY_CARE_PROVIDER_SITE_OTHER): Payer: BC Managed Care – PPO | Admitting: Professional

## 2021-05-06 DIAGNOSIS — J343 Hypertrophy of nasal turbinates: Secondary | ICD-10-CM | POA: Diagnosis not present

## 2021-05-06 DIAGNOSIS — F411 Generalized anxiety disorder: Secondary | ICD-10-CM | POA: Diagnosis not present

## 2021-05-06 DIAGNOSIS — R42 Dizziness and giddiness: Secondary | ICD-10-CM | POA: Diagnosis not present

## 2021-05-06 DIAGNOSIS — H7202 Central perforation of tympanic membrane, left ear: Secondary | ICD-10-CM | POA: Diagnosis not present

## 2021-05-06 DIAGNOSIS — F319 Bipolar disorder, unspecified: Secondary | ICD-10-CM

## 2021-05-06 DIAGNOSIS — J310 Chronic rhinitis: Secondary | ICD-10-CM | POA: Diagnosis not present

## 2021-05-06 DIAGNOSIS — H6982 Other specified disorders of Eustachian tube, left ear: Secondary | ICD-10-CM | POA: Diagnosis not present

## 2021-05-06 NOTE — Progress Notes (Signed)
?  [facility] Counselor/Therapist Progress Note ? ?Patient ID: Margaret Lopez, MRN: [id number],   ? ?Date: 05/06/2021 ? ?Time Spent: 49 minutes 1102-1151pm ? ?Treatment Type: Individual Therapy ? ?Risk Assessment: ?Danger to Self:  No ?Self-injurious Behavior: No ?Danger to Others: No ? ?Subjective: This session was held via video teletherapy due to the coronavirus risk at this time. The patient consented to video teletherapy and was located at her home during this session. She is aware it is the responsibility of the patient to secure confidentiality on her end of the session. The provider was in a private home office for the duration of this session.  ? ?The patient arrived on time for her webex appointment. ? ?Issues addressed: ?1-personal ?-things going well with her sons ?  -she successfully got her youngest son on anxiety medication and he is doing well ?-her stepson Rolla Plate is having an increase in behavioral issues ?  -issues at home and school ?    -at home he broke the windows out of the family's trailer ?  -causing issues in relationship pt has with her partner Lennette Bihari ?    -pt has verbalized that she will not continue in the relationship if Rolla Plate continues to destroy property ?  -school system removed him from IEP and placed him on a 504 plan that is not working ?  -discussed potential options for treatment that might be beneficial for [employer] ?2-nutrition ?-pt had picked up some weight but has lost it ?-she would like to drop ten more pounds to get to her ideal weight ?-discussed strategies ?  -pt states not eating out as frequently ? ?Interventions: Problems Addressed  ?Anxiety, Body Image Disturbance/Eating Disorders, Balancing Work and Family/Multiple Roles, Low Self-Esteem/Lack of Assertiveness  ?Goals ?1. Address and eliminate maladaptive thought processes that lead to anxious responses. ?Objective ?Identify the relationship between multiple roles, role overload, role strain, and anxiety  reactions. ?Target Date: 2021-08-05 Frequency: Weekly  ?Progress: 0 Modality: individual  ?Objective ?Learn and practice methods of reducing anxiety in a variety of situations. ?Target Date: 2021-08-05 Frequency: Weekly  ?Progress: 60 Modality: individual  ?Objective ?Self-correct maladaptive thought patterns preemptively in order to lessen or eliminate anxiety at least 90% of the time; increase positive self-talk. ?Target Date: 2021-08-05 Frequency: Weekly  ?Progress: 60 Modality: individual  ?Related Interventions ?Collaborate with the client to determine what kind of changes need to be made, internally and externally, for calmness and satisfaction to exist within the arenas of work, home, and school. ?Objective ?Verbalize the degree to which current life circumstances could be improved in areas such as work, home, and school. ?Target Date: 2021-08-05 Frequency: Weekly  ?Progress: 70 Modality: individual  ?Objective ?Acknowledge underlying irrational or illogical thought patterns that contribute to anxiety. ?Target Date: 2021-08-05 Frequency: Weekly  ?Progress: 80 Modality: individual  ?2. Change the definition of self to encompass positive attributes beyond body weight, size, and shape. ?3. Develop a realistic perspective regarding demands and obligations of multiple roles and their completion. ?4. Develop an awareness of internalized unrealistic and narrow standards of women's beauty and learn to challenge such beliefs. ?Objective ?Increase the number of realistic, positive messages regarding eating, food, and body size to at least three per day. ?Target Date: 2021-08-05 Frequency: Weekly  ?Progress: 0 Modality: individual  ?Objective ?Identify and change distorted self-talk messages associated with eating behavior. ?Target Date: 2021-08-05 Frequency: Weekly  ?Progress: 40 Modality: individual  ?Related Interventions ?Challenge the client's basis for construing herself in terms of  shape so the concept of self  goes beyond body weight; help her define herself in terms of relationships with family, friends, and spirituality, and in terms of accomplishments, traits, and abilities. ?Assist the client in developing internal standards of approval and reduce the need for external approval by brainstorming her positive attributes and qualities (e.g., personality, values, behavior). ?Objective ?Verbalize acceptance of control, self-determination, and responsibility for one's body image and eating behavior. ?Target Date: 2021-08-05 Frequency: Weekly  ?Progress: 40 Modality: individual  ?Objective ?Keep a journal of daily incidents of behaviors demonstrating body image preoccupation. ?Target Date: 2021-08-05 Frequency: Weekly  ?Progress: 0 Modality: individual  ?Related Interventions ?Reinforce positive affirmations, imagery, mirror work, drawing or sculpting one's body, and provide feedback to help correct body image distortions. ?Objective ?Implement three new healthy and appropriate strategies to cope with stress, depression, and anxiety rather than using unhealthy eating patterns. ?Target Date: 2021-08-05 Frequency: Weekly  ?Progress: 100 Modality: individual  ?5. Develop effective coping strategies to deal with interpersonal stressors and emotional issues. ?6. Develop time management strategies to reduce role overload and role conflicts. ?7. Eliminate depressive and anxiety symptoms associated with trying to balance multiple roles. ?8. Enhance ability to handle effectively life stressors. ?9. Improve self-esteem and develop a positive self-image as capable and competent. ?10. Increase ability to express needs and desires openly and honestly. ?11. Increase assertiveness skills and ability to advocate for self. ?12. Increase involvement in activities that foster confidence and a sense of accomplishment. ?13. Increase openness to experiences and opportunities associated with risk-taking. ?Objective ?Identify three roadblocks to  improved self-esteem. ?Target Date: 2021-08-05 Frequency: Weekly  ?Progress: 30 Modality: individual  ?Objective ?Acknowledge self-disparaging statements and recognize the tendency to engage in such statements. ?Target Date: 2021-08-05 Frequency: Weekly  ?Progress: 70 Modality: individual  ?Objective ?Identify three anxiety-related fears associated with being assertive. ?Target Date: 2021-08-05 Frequency: Weekly  ?Progress: 0 Modality: individual  ?Related Interventions ?Assist the client with identifying gender role messages that are empowering and those that are limiting to her self-worth and assertiveness; facilitate her identifying and developing her own values. ?Objective ?Verbalize an understanding of the differences between passive, assertive, and aggressive behavior. ?Target Date: 2021-08-05 Frequency: Weekly  ?Progress: 70 Modality: individual  ?Related Interventions ?Assist the client in identifying negative beliefs about herself. ?Assist the client in becoming aware of how she indirectly expresses negative feelings about self (e.g., lack of eye contact, social withdrawal, sweating, expectations of failure or rejection). ?Use cognitive-restructuring techniques to change the client's belief system (i.e., via reality-testing and rational thought) toward a more positive, realistic self-perception. ?Encourage participation in physical activities that foster confidence and well-being (e.g., yoga, meditation, martial arts). ?Explore the client's fears of being assertive (e.g., fear of rejection, fear of ineffectiveness, fear of ridicule). ?Explore areas of competency with the client and encourage her to engage in related activities (e.g., dance, singing, arts, volunteer work, joining a book club). ?Explore with the client avenues for receiving emotional support from others; connect her with further services such as women's groups when necessary. ?Obtain feedback on any difficulties the client experiences in  application and provide tips for integrating methods into his/her everyday routine. 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

## 2021-05-15 DIAGNOSIS — J301 Allergic rhinitis due to pollen: Secondary | ICD-10-CM | POA: Diagnosis not present

## 2021-05-17 DIAGNOSIS — J3089 Other allergic rhinitis: Secondary | ICD-10-CM | POA: Diagnosis not present

## 2021-05-17 DIAGNOSIS — J3081 Allergic rhinitis due to animal (cat) (dog) hair and dander: Secondary | ICD-10-CM | POA: Diagnosis not present

## 2021-05-27 ENCOUNTER — Encounter: Payer: Self-pay | Admitting: Professional

## 2021-05-27 ENCOUNTER — Ambulatory Visit (INDEPENDENT_AMBULATORY_CARE_PROVIDER_SITE_OTHER): Payer: BC Managed Care – PPO | Admitting: Professional

## 2021-05-27 DIAGNOSIS — F319 Bipolar disorder, unspecified: Secondary | ICD-10-CM | POA: Diagnosis not present

## 2021-05-27 DIAGNOSIS — F411 Generalized anxiety disorder: Secondary | ICD-10-CM | POA: Diagnosis not present

## 2021-05-27 NOTE — Progress Notes (Signed)
[place] Counselor/Therapist Progress Note  Patient ID: Margaret Lopez, MRN: [id number],    Date: 05/27/2021  Time Spent: 43 minutes 1102-1145am  Treatment Type: Individual Therapy  Risk Assessment: Danger to Self:  No Self-injurious Behavior: No Danger to Others: No  Subjective: This session was held via video teletherapy. The patient consented to video teletherapy and was located at her home during this session. She is aware it is the responsibility of the patient to secure confidentiality on her end of the session. The provider was in a private home office for the duration of this session.   The patient arrived on time for her webex appointment.  Issues addressed: 1-personal a-[place] -pt took him to MD   -he added '5mg'$  Ritalin and Abilify at nightto his daily regime   -behaviors have been a little better   -he has bene grounded so much and they are keeping an eye on him   -he is still pooping in his pants and did so yesterday during game time     -he had everything taken away again     -pt sent message to PCP and psychiatrist       -psychiatrist can only identify as behavioral issue -provided pt resource for evaluation and treatment of behavioral issues related to encopresis -pt learned from [facility] that he does not have accidents at his mother's   -he broke the lights on camper after he returned from his visit with her   -his dad Lennette Bihari shared with pt that he is probably reluctant to lose contact with his mother  Interventions: Problems Addressed  Anxiety, Body Image Disturbance/Eating Disorders, Balancing Work and Family/Multiple Roles, Low Self-Esteem/Lack of Assertiveness  Goals 1. Address and eliminate maladaptive thought processes that lead to anxious responses. Objective Identify the relationship between multiple roles, role overload, role strain, and anxiety reactions. Target Date: 2021-08-05 Frequency: Weekly  Progress: 0 Modality: individual   Objective Learn and practice methods of reducing anxiety in a variety of situations. Target Date: 2021-08-05 Frequency: Weekly  Progress: 60 Modality: individual  Objective Self-correct maladaptive thought patterns preemptively in order to lessen or eliminate anxiety at least 90% of the time; increase positive self-talk. Target Date: 2021-08-05 Frequency: Weekly  Progress: 60 Modality: individual  Related Interventions Collaborate with the client to determine what kind of changes need to be made, internally and externally, for calmness and satisfaction to exist within the arenas of work, home, and school. Objective Verbalize the degree to which current life circumstances could be improved in areas such as work, home, and school. Target Date: 2021-08-05 Frequency: Weekly  Progress: 70 Modality: individual  Objective Acknowledge underlying irrational or illogical thought patterns that contribute to anxiety. Target Date: 2021-08-05 Frequency: Weekly  Progress: 80 Modality: individual  2. Change the definition of self to encompass positive attributes beyond body weight, size, and shape. 3. Develop a realistic perspective regarding demands and obligations of multiple roles and their completion. 4. Develop an awareness of internalized unrealistic and narrow standards of women's beauty and learn to challenge such beliefs. Objective Increase the number of realistic, positive messages regarding eating, food, and body size to at least three per day. Target Date: 2021-08-05 Frequency: Weekly  Progress: 0 Modality: individual  Objective Identify and change distorted self-talk messages associated with eating behavior. Target Date: 2021-08-05 Frequency: Weekly  Progress: 40 Modality: individual  Related Interventions Challenge the client's basis for construing herself in terms of shape so the concept of self goes beyond body  weight; help her define herself in terms of relationships with family,  friends, and spirituality, and in terms of accomplishments, traits, and abilities. Assist the client in developing internal standards of approval and reduce the need for external approval by brainstorming her positive attributes and qualities (e.g., personality, values, behavior). Objective Verbalize acceptance of control, self-determination, and responsibility for one's body image and eating behavior. Target Date: 2021-08-05 Frequency: Weekly  Progress: 40 Modality: individual  Objective Keep a journal of daily incidents of behaviors demonstrating body image preoccupation. Target Date: 2021-08-05 Frequency: Weekly  Progress: 0 Modality: individual  Related Interventions Reinforce positive affirmations, imagery, mirror work, drawing or sculpting one's body, and provide feedback to help correct body image distortions. Objective Implement three new healthy and appropriate strategies to cope with stress, depression, and anxiety rather than using unhealthy eating patterns. Target Date: 2021-08-05 Frequency: Weekly  Progress: 100 Modality: individual  5. Develop effective coping strategies to deal with interpersonal stressors and emotional issues. 6. Develop time management strategies to reduce role overload and role conflicts. 7. Eliminate depressive and anxiety symptoms associated with trying to balance multiple roles. 8. Enhance ability to handle effectively life stressors. 9. Improve self-esteem and develop a positive self-image as capable and competent. 10. Increase ability to express needs and desires openly and honestly. 11. Increase assertiveness skills and ability to advocate for self. 12. Increase involvement in activities that foster confidence and a sense of accomplishment. 13. Increase openness to experiences and opportunities associated with risk-taking. Objective Identify three roadblocks to improved self-esteem. Target Date: 2021-08-05 Frequency: Weekly  Progress: 30 Modality:  individual  Objective Acknowledge self-disparaging statements and recognize the tendency to engage in such statements. Target Date: 2021-08-05 Frequency: Weekly  Progress: 70 Modality: individual  Objective Identify three anxiety-related fears associated with being assertive. Target Date: 2021-08-05 Frequency: Weekly  Progress: 0 Modality: individual  Related Interventions Assist the client with identifying gender role messages that are empowering and those that are limiting to her self-worth and assertiveness; facilitate her identifying and developing her own values. Objective Verbalize an understanding of the differences between passive, assertive, and aggressive behavior. Target Date: 2021-08-05 Frequency: Weekly  Progress: 70 Modality: individual  Related Interventions Assist the client in identifying negative beliefs about herself. Assist the client in becoming aware of how she indirectly expresses negative feelings about self (e.g., lack of eye contact, social withdrawal, sweating, expectations of failure or rejection). Use cognitive-restructuring techniques to change the client's belief system (i.e., via reality-testing and rational thought) toward a more positive, realistic self-perception. Encourage participation in physical activities that foster confidence and well-being (e.g., yoga, meditation, martial arts). Explore the client's fears of being assertive (e.g., fear of rejection, fear of ineffectiveness, fear of ridicule). Explore areas of competency with the client and encourage her to engage in related activities (e.g., dance, singing, arts, volunteer work, joining a book club). Explore with the client avenues for receiving emotional support from others; connect her with further services such as women's groups when necessary. Obtain feedback on any difficulties the client experiences in application and provide tips for integrating methods into his/her everyday routine. Explore  with the client her expectations regarding the balance between work and family, and discuss how this relates to internalized gender role stereotypes. Assign the client to conduct family meetings regularly (e.g., possibly biweekly or monthly) with children (ages 53 and up) and partner to delegate household responsibilities. Use behavioral techniques (i.e., education, modeling, role-playing, corrective feedback, positive reinforcement) to teach the couple problem-solving and conflict-resolution  skills, including defining the problem constructively and specifically, brainstorming options, evaluating options, compromise, choosing options and implementing a plan, and evaluating the results. Assist the client in developing more adaptive strategies and behaviors (e.g., relaxation, read a book, call a friend) to cope with uncomfortable emotional states. Encourage the client to develop competencies in other areas of life (e.g., sports, hobbies, educational activities) to increase feelings of self-worth and self-esteem. Assist the client in identifying cues that lead to unhealthy eating by assigning her to write a daily journal of eating behavior, thoughts, and feelings. Objective Decrease the frequency of making self-disparaging remarks. Target Date: 2021-08-05 Frequency: Weekly  Progress: 70 Modality: individual  Related Interventions Encourage the client to make at least one positive statement about herself during the therapy session. Assist the client in developing a list of positive affirmations to be read three times a day or to be put on a mirror at home. Ask the client to make a list including positive qualities about herself and accomplishments; verbally reinforce positive self-statements. 14. Increase overall sense of well-being via reduction/elimination of anxiety. 15. Maintain a balance between the multiple demands of motherhood, work, and other life roles and responsibilities. Objective Generate a  list of self-care activities and make a commitment to regularly participate in such activities. Target Date: 2021-08-05 Frequency: Weekly  Progress: 0 Modality: individual  Objective Communicate needs with partner regarding multiple role obligations. Target Date: 2021-08-05 Frequency: Weekly  Progress: 50 Modality: individual  Objective Identify at least five practical ways to manage stress associated with multiple demands. Target Date: 2021-08-05 Frequency: Weekly  Progress: 60 Modality: individual  Objective Learn and implement stress management and relaxation techniques to reduce fatigue, anxiety, and depressive symptoms. Target Date: 2021-08-05 Frequency: Weekly  Progress: 40 Modality: individual  16. Reduce self-disparaging remarks and negative self-talk.  Diagnosis:Bipolar 1 disorder, depressed ([place])  GAD (generalized anxiety disorder)  Plan:  -meet again on Thursday, June 17, 2021 at [place], [facility]

## 2021-05-29 ENCOUNTER — Other Ambulatory Visit: Payer: Self-pay | Admitting: Neurology

## 2021-05-31 ENCOUNTER — Telehealth: Payer: Self-pay | Admitting: *Deleted

## 2021-05-31 NOTE — Telephone Encounter (Signed)
Fidelia Paro Key: [id number]  PA Ubrevly sent this afternoon  waiting on approval

## 2021-06-01 NOTE — Telephone Encounter (Signed)
Donika Asbill Key: [id number]  PA Ubrevly done yesterday was cancelled. Called [employer] today 06/01/2021 sent New PA Roselyn Meier this  afternoon waiting on approval

## 2021-06-08 ENCOUNTER — Ambulatory Visit: Payer: BC Managed Care – PPO | Admitting: Neurology

## 2021-06-08 DIAGNOSIS — G43709 Chronic migraine without aura, not intractable, without status migrainosus: Secondary | ICD-10-CM

## 2021-06-08 MED ORDER — UBRELVY 100 MG PO TABS: 100.0000 mg | ORAL_TABLET | ORAL | 11 refills | Status: DC | PRN

## 2021-06-08 NOTE — Progress Notes (Signed)
Botox- 200 units x 1 vial ?Lot: [id number] ?Expiration: 12/2023 ?NDC: 0023-3921-02 ? ?Bacteriostatic 0.9% Sodium Chloride- 4mL total ?Lot: [id number] ?Expiration: 08/11/2022 ?NDC: 0409-1966-02 ? ?Dx:G43.709 ?B/B ? ?

## 2021-06-08 NOTE — Progress Notes (Signed)
Consent Form Botulism Toxin Injection For Chronic Migraine  On Botox migraines have been reduced to 4 migraine days a month and <10 total headache days a month.    06/08/2021: Baseline 15 headache days a month and 10 migraine days a month. Excellent response, she has > 75% reduction in migraines and headaches monthlyPatient felt her forehead and eyebrows were uncomfortable due to inability to move them, reduced injections 1/2 injections in the corrigators and procerus, did the frontalis very high and was much better. 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 She also has temple pain.   Dry needling gave her a very bad migraine will not order that again. Aimovig gave her constipation and we stopped due to insurance. Doing so well doesn't even need to consider aimovig* at this time 03/16/2021: She had to put her dog down at [place] was 14. She had a very bad migraine.  When she has a migraine we use Ubrelvy. Try getting her aimovig  09/15/2020: Baseline 15 headache days a month and 10 migraine days a month. Excellent response, she has > 75% reduction in migraines and headaches monthly. Discussed acute management prescribed frova, zembrace works quickly but the migraines rebound. Did great on Nurtec and we prescribed and she continues to take. Patient felt her forehead and eyebrows were uncomfortable due to inability to move them, reduced injections 1/2 injections in the corrigators and procerus, did the frontalis very high and was much better. 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 She also has temple pain.   Dry needling gave her a very bad migraine will not order that again. Aimovig gave her constipation and we stopped due to insurance. Doing so well doesn't even need to consider aimovig* at this time   Reviewed orally with patient, additionally signature is on file:  Botulism  toxin has been approved by the Federal drug administration for treatment of chronic migraine. Botulism toxin does not cure chronic migraine and it may not be effective in some patients.  The administration of botulism toxin is accomplished by injecting a small amount of toxin into the muscles of the neck and head. Dosage must be titrated for each individual. Any benefits resulting from botulism toxin tend to wear off after 3 months with a repeat injection required if benefit is to be maintained. Injections are usually done every 3-4 months with maximum effect peak achieved by about 2 or 3 weeks. Botulism toxin is expensive and you should be sure of what costs you will incur resulting from the injection.  The side effects of botulism toxin use for chronic migraine may include:   -Transient, and usually mild, facial weakness with facial injections  -Transient, and usually mild, head or neck weakness with head/neck injections  -Reduction or loss of forehead facial animation due to forehead muscle weakness  -Eyelid drooping  -Dry eye  -Pain at the site of injection or bruising at the site of injection  -Double vision  -Potential unknown long term risks  Contraindications: You should not have Botox if you are pregnant, nursing, allergic to albumin, have an infection, skin condition, or muscle weakness at the site of the injection, or have myasthenia gravis, Lambert-Eaton syndrome, or ALS.  It is also possible that as with any injection, there may be an allergic reaction or no effect from the medication. Reduced effectiveness after repeated injections is sometimes seen and rarely infection  at the injection site may occur. All care will be taken to prevent these side effects. If therapy is given over a long time, atrophy and wasting in the muscle injected may occur. Occasionally the patient's become refractory to treatment because they develop antibodies to the toxin. In this event, therapy needs to be  modified.  I have read the above information and consent to the administration of botulism toxin.    BOTOX PROCEDURE NOTE FOR MIGRAINE HEADACHE    Contraindications and precautions discussed with patient(above). Aseptic procedure was observed and patient tolerated procedure. Procedure performed by Dr. Georgia Dom  The condition has existed for more than 6 months, and pt does not have a diagnosis of ALS, Myasthenia Gravis or Lambert-Eaton Syndrome.  Risks and benefits of injections discussed and pt agrees to proceed with the procedure.  Written consent obtained  These injections are medically necessary. Pt  receives good benefits from these injections. These injections do not cause sedations or hallucinations which the oral therapies may cause.  Indication/Diagnosis: chronic migraine BOTOX(J0585) injection was performed according to protocol by [employer]. 200 units of BOTOX was dissolved into 4 cc NS.   NDC: [address]-62   Description of procedure:  The patient was placed in a sitting position. The standard protocol was used for Botox as follows, with 5 units of Botox injected at each site:   -Procerus muscle, midline injection 2.5 units   -Corrugator muscle, bilateral injection 2.5 units each  -Frontalis muscle, bilateral injection, with 2 sites each side, medial injection was performed in the upper one third of the frontalis muscle, in the region vertical from the medial inferior edge of the superior orbital rim. The lateral injection was again in the upper one third of the forehead vertically above the lateral limbus of the cornea, 1.5 cm lateral to the medial injection site.  -Temporalis muscle injection, 4 sites, bilaterally. The first injection was 3 cm above the tragus of the ear, second injection site was 1.5 cm to 3 cm up from the first injection site in line with the tragus of the ear. The third injection site was 1.5-3 cm forward between the first 2 injection sites. The  fourth injection site was 1.5 cm posterior to the second injection site. .  -Occipitalis muscle injection, 3 sites, bilaterally. The first injection was done one half way between the occipital protuberance and the tip of the mastoid process behind the ear. The second injection site was done lateral and superior to the first, 1 fingerbreadth from the first injection. The third injection site was 1 fingerbreadth superiorly and medially from the first injection site.  -Cervical paraspinal muscle injection, 2 sites, bilateral knee first injection site was 1 cm from the midline of the cervical spine, 3 cm inferior to the lower border of the occipital protuberance. The second injection site was 1.5 cm superiorly and laterally to the first injection site.  -Trapezius muscle injection was performed at 3 sites, bilaterally. The first injection site was in the upper trapezius muscle halfway between the inflection point of the neck, and the acromion. The second injection site was one half way between the acromion and the first injection site. The third injection was done between the first injection site and the inflection point of the neck.   Will return for repeat injection in 3 months.   155 unit sof Botox was used, 45 Botox was wasted. The patient tolerated the procedure well, there were no complications of the above procedure.

## 2021-06-09 NOTE — Telephone Encounter (Signed)
Additional fax questionnaire for this PA was received and completed. Place on MD's desk for review and signature.

## 2021-06-10 ENCOUNTER — Ambulatory Visit: Payer: BC Managed Care – PPO | Admitting: Professional

## 2021-06-10 ENCOUNTER — Other Ambulatory Visit: Payer: Self-pay | Admitting: Neurology

## 2021-06-15 NOTE — Telephone Encounter (Signed)
Received fax from [facility]. Margaret Lopez has been approved from 06/01/21 - 08/23/21. Approval faxed to pharmacy. Received a receipt of confirmation.

## 2021-06-16 ENCOUNTER — Ambulatory Visit: Payer: BC Managed Care – PPO | Admitting: Adult Health

## 2021-06-17 ENCOUNTER — Ambulatory Visit (INDEPENDENT_AMBULATORY_CARE_PROVIDER_SITE_OTHER): Payer: BC Managed Care – PPO | Admitting: Professional

## 2021-06-17 ENCOUNTER — Encounter: Payer: Self-pay | Admitting: Professional

## 2021-06-17 DIAGNOSIS — F411 Generalized anxiety disorder: Secondary | ICD-10-CM

## 2021-06-17 DIAGNOSIS — F319 Bipolar disorder, unspecified: Secondary | ICD-10-CM | POA: Diagnosis not present

## 2021-06-17 NOTE — Progress Notes (Signed)
[facility] Counselor/Therapist Progress Note  Patient ID: Margaret Lopez, MRN: [id number],    Date: 06/17/2021  Time Spent: 48 minutes 1105-1153am  Treatment Type: Individual Therapy  Risk Assessment: Danger to Self:  No Self-injurious Behavior: No Danger to Others: No  Subjective: This session was held via video teletherapy. The patient consented to video teletherapy and was located at her home during this session. She is aware it is the responsibility of the patient to secure confidentiality on her end of the session. The provider was in a private home office for the duration of this session.   The patient arrived on time for her webex appointment.  Issues addressed: 1-personal a-Logan -things are going somewhat better -there are less bowel incidents   -he told his dad and stepmother that he would not do at his moms house because she would not let him come over       -he admitted that he believed it was his fault when she doesn't let him come over       -pt shared that his mother might have adult things to do       -he told his dad Lennette Bihari that it was okay she probably had some adult things to do this week   -pt asked why at his dad/stepmom's home     -he said they were great parents too     -pt relayed that they will not turn him away -pt took him to MD   -changed time for Ritalin dosage to 230pm to get him through dinner and bath and not disrupt bedtime -his mom hasn't seen him in almost four week; saw him on Mother's Day for 12 hrs -his mom has only texted one time in the pat several weeks 2-pt's mental health -someday's better than others a-her house is a reflection of her mental health   -"it is a hot mess"   -her pets have some impact -pt cannot concentrate to study b-Chance her Milana Obey has marked everywhere and her home now smell   -TV stand that was given to her from her grandparents     -pt tearfully admits the memories attached to it make it hard  to give away   -he is a rescue and was 5 lbs when they got him and now he is 7 lbs   -she needs to have him neutered and his teeth cleaned   -the cost by a vet is 400-500 and too much money   -spay and neuter appt much more affordable -pt struggles to manage the pets and listen more to [place] than to her c-pt feels foggy but cannot clean her mind -create a list for morning and afternoon each day -pt feeling stressed about having Logan for a lot of the summer  Interventions: Problems Addressed  Anxiety, Body Image Disturbance/Eating Disorders, Balancing Work and Family/Multiple Roles, Low Self-Esteem/Lack of Assertiveness  Goals 1. Address and eliminate maladaptive thought processes that lead to anxious responses. Objective Identify the relationship between multiple roles, role overload, role strain, and anxiety reactions. Target Date: 2021-08-05 Frequency: Weekly  Progress: 0 Modality: individual  Objective Learn and practice methods of reducing anxiety in a variety of situations. Target Date: 2021-08-05 Frequency: Weekly  Progress: 60 Modality: individual  Objective Self-correct maladaptive thought patterns preemptively in order to lessen or eliminate anxiety at least 90% of the time; increase positive self-talk. Target Date: 2021-08-05 Frequency: Weekly  Progress: 60 Modality: individual  Related Interventions Collaborate with the  client to determine what kind of changes need to be made, internally and externally, for calmness and satisfaction to exist within the arenas of work, home, and school. Objective Verbalize the degree to which current life circumstances could be improved in areas such as work, home, and school. Target Date: 2021-08-05 Frequency: Weekly  Progress: 70 Modality: individual  Objective Acknowledge underlying irrational or illogical thought patterns that contribute to anxiety. Target Date: 2021-08-05 Frequency: Weekly  Progress: 80 Modality: individual  2.  Change the definition of self to encompass positive attributes beyond body weight, size, and shape. 3. Develop a realistic perspective regarding demands and obligations of multiple roles and their completion. 4. Develop an awareness of internalized unrealistic and narrow standards of women's beauty and learn to challenge such beliefs. Objective Increase the number of realistic, positive messages regarding eating, food, and body size to at least three per day. Target Date: 2021-08-05 Frequency: Weekly  Progress: 0 Modality: individual  Objective Identify and change distorted self-talk messages associated with eating behavior. Target Date: 2021-08-05 Frequency: Weekly  Progress: 40 Modality: individual  Related Interventions Challenge the client's basis for construing herself in terms of shape so the concept of self goes beyond body weight; help her define herself in terms of relationships with family, friends, and spirituality, and in terms of accomplishments, traits, and abilities. Assist the client in developing internal standards of approval and reduce the need for external approval by brainstorming her positive attributes and qualities (e.g., personality, values, behavior). Objective Verbalize acceptance of control, self-determination, and responsibility for one's body image and eating behavior. Target Date: 2021-08-05 Frequency: Weekly  Progress: 40 Modality: individual  Objective Keep a journal of daily incidents of behaviors demonstrating body image preoccupation. Target Date: 2021-08-05 Frequency: Weekly  Progress: 0 Modality: individual  Related Interventions Reinforce positive affirmations, imagery, mirror work, drawing or sculpting one's body, and provide feedback to help correct body image distortions. Objective Implement three new healthy and appropriate strategies to cope with stress, depression, and anxiety rather than using unhealthy eating patterns. Target Date: 2021-08-05  Frequency: Weekly  Progress: 100 Modality: individual  5. Develop effective coping strategies to deal with interpersonal stressors and emotional issues. 6. Develop time management strategies to reduce role overload and role conflicts. 7. Eliminate depressive and anxiety symptoms associated with trying to balance multiple roles. 8. Enhance ability to handle effectively life stressors. 9. Improve self-esteem and develop a positive self-image as capable and competent. 10. Increase ability to express needs and desires openly and honestly. 11. Increase assertiveness skills and ability to advocate for self. 12. Increase involvement in activities that foster confidence and a sense of accomplishment. 13. Increase openness to experiences and opportunities associated with risk-taking. Objective Identify three roadblocks to improved self-esteem. Target Date: 2021-08-05 Frequency: Weekly  Progress: 30 Modality: individual  Objective Acknowledge self-disparaging statements and recognize the tendency to engage in such statements. Target Date: 2021-08-05 Frequency: Weekly  Progress: 70 Modality: individual  Objective Identify three anxiety-related fears associated with being assertive. Target Date: 2021-08-05 Frequency: Weekly  Progress: 0 Modality: individual  Related Interventions Assist the client with identifying gender role messages that are empowering and those that are limiting to her self-worth and assertiveness; facilitate her identifying and developing her own values. Objective Verbalize an understanding of the differences between passive, assertive, and aggressive behavior. Target Date: 2021-08-05 Frequency: Weekly  Progress: 70 Modality: individual  Related Interventions Assist the client in identifying negative beliefs about herself. Assist the client in becoming aware of how she  indirectly expresses negative feelings about self (e.g., lack of eye contact, social withdrawal, sweating,  expectations of failure or rejection). Use cognitive-restructuring techniques to change the client's belief system (i.e., via reality-testing and rational thought) toward a more positive, realistic self-perception. Encourage participation in physical activities that foster confidence and well-being (e.g., yoga, meditation, martial arts). Explore the client's fears of being assertive (e.g., fear of rejection, fear of ineffectiveness, fear of ridicule). Explore areas of competency with the client and encourage her to engage in related activities (e.g., dance, singing, arts, volunteer work, joining a book club). Explore with the client avenues for receiving emotional support from others; connect her with further services such as women's groups when necessary. Obtain feedback on any difficulties the client experiences in application and provide tips for integrating methods into his/her everyday routine. Explore with the client her expectations regarding the balance between work and family, and discuss how this relates to internalized gender role stereotypes. Assign the client to conduct family meetings regularly (e.g., possibly biweekly or monthly) with children (ages 56 and up) and partner to delegate household responsibilities. Use behavioral techniques (i.e., education, modeling, role-playing, corrective feedback, positive reinforcement) to teach the couple problem-solving and conflict-resolution skills, including defining the problem constructively and specifically, brainstorming options, evaluating options, compromise, choosing options and implementing a plan, and evaluating the results. Assist the client in developing more adaptive strategies and behaviors (e.g., relaxation, read a book, call a friend) to cope with uncomfortable emotional states. Encourage the client to develop competencies in other areas of life (e.g., sports, hobbies, educational activities) to increase feelings of self-worth and  self-esteem. Assist the client in identifying cues that lead to unhealthy eating by assigning her to write a daily journal of eating behavior, thoughts, and feelings. Objective Decrease the frequency of making self-disparaging remarks. Target Date: 2021-08-05 Frequency: Weekly  Progress: 70 Modality: individual  Related Interventions Encourage the client to make at least one positive statement about herself during the therapy session. Assist the client in developing a list of positive affirmations to be read three times a day or to be put on a mirror at home. Ask the client to make a list including positive qualities about herself and accomplishments; verbally reinforce positive self-statements. 14. Increase overall sense of well-being via reduction/elimination of anxiety. 15. Maintain a balance between the multiple demands of motherhood, work, and other life roles and responsibilities. Objective Generate a list of self-care activities and make a commitment to regularly participate in such activities. Target Date: 2021-08-05 Frequency: Weekly  Progress: 0 Modality: individual  Objective Communicate needs with partner regarding multiple role obligations. Target Date: 2021-08-05 Frequency: Weekly  Progress: 50 Modality: individual  Objective Identify at least five practical ways to manage stress associated with multiple demands. Target Date: 2021-08-05 Frequency: Weekly  Progress: 60 Modality: individual  Objective Learn and implement stress management and relaxation techniques to reduce fatigue, anxiety, and depressive symptoms. Target Date: 2021-08-05 Frequency: Weekly  Progress: 40 Modality: individual  16. Reduce self-disparaging remarks and negative self-talk.  Diagnosis:Bipolar 1 disorder, depressed ([place])  GAD (generalized anxiety disorder)  Plan:  -call and schedule spay and neuter -get rid of furniture that is destroyed due to urine -meet again on Thursday, August 12, 2021  at [place], [facility]

## 2021-06-22 ENCOUNTER — Ambulatory Visit: Payer: BC Managed Care – PPO | Admitting: Neurology

## 2021-06-24 DIAGNOSIS — Z13220 Encounter for screening for lipoid disorders: Secondary | ICD-10-CM | POA: Diagnosis not present

## 2021-06-24 DIAGNOSIS — K219 Gastro-esophageal reflux disease without esophagitis: Secondary | ICD-10-CM | POA: Diagnosis not present

## 2021-06-24 DIAGNOSIS — J3089 Other allergic rhinitis: Secondary | ICD-10-CM | POA: Diagnosis not present

## 2021-06-24 DIAGNOSIS — Z0000 Encounter for general adult medical examination without abnormal findings: Secondary | ICD-10-CM | POA: Diagnosis not present

## 2021-06-24 DIAGNOSIS — F319 Bipolar disorder, unspecified: Secondary | ICD-10-CM | POA: Diagnosis not present

## 2021-06-24 DIAGNOSIS — G43909 Migraine, unspecified, not intractable, without status migrainosus: Secondary | ICD-10-CM | POA: Diagnosis not present

## 2021-06-29 DIAGNOSIS — M545 Low back pain, unspecified: Secondary | ICD-10-CM | POA: Diagnosis not present

## 2021-07-15 ENCOUNTER — Telehealth (HOSPITAL_BASED_OUTPATIENT_CLINIC_OR_DEPARTMENT_OTHER): Payer: BC Managed Care – PPO | Admitting: Psychiatry

## 2021-07-15 ENCOUNTER — Encounter (HOSPITAL_COMMUNITY): Payer: Self-pay | Admitting: Psychiatry

## 2021-07-15 DIAGNOSIS — F319 Bipolar disorder, unspecified: Secondary | ICD-10-CM

## 2021-07-15 DIAGNOSIS — F411 Generalized anxiety disorder: Secondary | ICD-10-CM | POA: Diagnosis not present

## 2021-07-15 DIAGNOSIS — Z5181 Encounter for therapeutic drug level monitoring: Secondary | ICD-10-CM

## 2021-07-15 MED ORDER — DIVALPROEX SODIUM ER 500 MG PO TB24: 1500.0000 mg | ORAL_TABLET | Freq: Every day | ORAL | 0 refills | Status: DC

## 2021-07-15 MED ORDER — BUPROPION HCL ER (XL) 150 MG PO TB24: 150.0000 mg | ORAL_TABLET | ORAL | 0 refills | Status: DC

## 2021-07-15 MED ORDER — LITHIUM CARBONATE ER 300 MG PO TBCR: 600.0000 mg | EXTENDED_RELEASE_TABLET | Freq: Every evening | ORAL | 0 refills | Status: DC

## 2021-07-15 NOTE — Progress Notes (Signed)
Virtual Visit via Video Note  I connected with Margaret Lopez on 07/15/21 at  9:00 AM EDT by a video enabled telemedicine application and verified that I am speaking with the correct person using two identifiers.  Location: Patient: home Provider: office   I discussed the limitations of evaluation and management by telemedicine and the availability of in person appointments. The patient expressed understanding and agreed to proceed.  History of Present Illness: Margaret Lopez shares that she has been ok. She has a number of stressors but feels her reaction is appropriate to the situations. She was also out of Depakote for 1 week which contributed to her stress. She restarted it 2 days ago. Her anxiety is manageable. Margaret Lopez denies GI upset, restlessness and insomnia related to anxiety. Most nights she sleeps 8-9 hrs. Her appetite and energy are good. Margaret Lopez denies depression and anhedonia. At times she doesn't want to go out and thinks it may be due to the forced isolation from [place]. She denies any manic or hypomanic like symptoms or episodes. Margaret Lopez denies SI/HI. She is taking a class that finishes in December and is trying to focus on that now. Her migraines have been a little better lately. She takes Xanax 1-2x/week when she is really stressed. It calms her down and makes her sleepy so she rarely takes it.    Observations/Objective: Psychiatric Specialty Exam: ROS  There were no vitals taken for this visit.There is no height or weight on file to calculate BMI.  General Appearance: Neat and Well Groomed  Eye Contact:  Good  Speech:  Clear and Coherent and Normal Rate  Volume:  Normal  Mood:  Euthymic  Affect:  Full Range  Thought Process:  Goal Directed, Linear, and Descriptions of Associations: Intact  Orientation:  Full (Time, Place, and Person)  Thought Content:  Logical  Suicidal Thoughts:  No  Homicidal Thoughts:  No  Memory:  Immediate;   Good  Judgement:  Good  Insight:  Good  Psychomotor  Activity:  Normal  Concentration:  Concentration: Good  Recall:  Good  Fund of Knowledge:  Good  Language:  Good  Akathisia:  No  Handed:  Right  AIMS (if indicated):     Assets:  Communication Skills Desire for Improvement Financial Resources/Insurance Housing Intimacy Leisure Time Resilience Social Support Talents/Skills Transportation Vocational/Educational  ADL's:  Intact  Cognition:  WNL  Sleep:        Assessment and Plan:     07/15/2021    9:07 AM 04/29/2021   10:42 AM 02/04/2021   10:12 AM 11/12/2020   10:43 AM 08/20/2020    1:42 PM  Depression screen PHQ 2/9  Decreased Interest 0 1 0 0 1  Down, Depressed, Hopeless 0 1 0 0 2  PHQ - 2 Score 0 2 0 0 3  Altered sleeping  0   1  Tired, decreased energy  1   1  Change in appetite  0   0  Feeling bad or failure about yourself   0   2  Trouble concentrating  0   0  Moving slowly or fidgety/restless  0   0  Suicidal thoughts  0   0  PHQ-9 Score  3   7  Difficult doing work/chores  Somewhat difficult   Very difficult    Flowsheet Row Video Visit from 07/15/2021 in [facility] Video Visit from 04/29/2021 in [facility] Video Visit from 02/04/2021 in [facility]  C-SSRS RISK CATEGORY No Risk No Risk No Risk        The risk of un-intended pregnancy is low  based on the fact that pt reports she had a partial hysterectomy. Pt is aware that these meds carry a teratogenic risk. Pt will discuss plan of action if she does or plans to become pregnant in the future.  Status of current problems: stable  Meds: continue Xanax- no refill today, as she has enough for now  1. Generalized anxiety disorder - buPROPion (WELLBUTRIN XL) 150 MG 24 hr tablet; Take 1 tablet (150 mg total) by mouth every morning.  Dispense: 90 tablet; Refill: 0  2. Bipolar 1 disorder, depressed ([facility]) - buPROPion (WELLBUTRIN XL) 150 MG 24  hr tablet; Take 1 tablet (150 mg total) by mouth every morning.  Dispense: 90 tablet; Refill: 0 - divalproex (DEPAKOTE ER) 500 MG 24 hr tablet; Take 3 tablets (1,500 mg total) by mouth daily.  Dispense: 270 tablet; Refill: 0 - lithium carbonate (LITHOBID) 300 MG CR tablet; Take 2 tablets (600 mg total) by mouth every evening.  Dispense: 180 tablet; Refill: 0  3. Encounter for therapeutic drug level monitoring - Valproic acid level - Lithium level - CBC - TSH - Comprehensive metabolic panel         Therapy: brief supportive therapy provided. Discussed psychosocial stressors in detail.     Collaboration of Care: Other none  Patient/Guardian was advised Release of Information must be obtained prior to any record release in order to collaborate their care with an outside provider. Patient/Guardian was advised if they have not already done so to contact the registration department to sign all necessary forms in order for [name] to release information regarding their care.   Consent: Patient/Guardian gives verbal consent for treatment and assignment of benefits for services provided during this visit. Patient/Guardian expressed understanding and agreed to proceed.  Follow Up Instructions: Follow up in 2-3 months or sooner if needed    I discussed the assessment and treatment plan with the patient. The patient was provided an opportunity to ask questions and all were answered. The patient agreed with the plan and demonstrated an understanding of the instructions.   The patient was advised to call back or seek an in-person evaluation if the symptoms worsen or if the condition fails to improve as anticipated.  I provided 13 minutes of non-face-to-face time during this encounter.   Charlcie Cradle, MD

## 2021-08-12 ENCOUNTER — Encounter: Payer: Self-pay | Admitting: Professional

## 2021-08-12 ENCOUNTER — Ambulatory Visit (INDEPENDENT_AMBULATORY_CARE_PROVIDER_SITE_OTHER): Payer: BC Managed Care – PPO | Admitting: Professional

## 2021-08-12 DIAGNOSIS — F411 Generalized anxiety disorder: Secondary | ICD-10-CM

## 2021-08-12 DIAGNOSIS — F319 Bipolar disorder, unspecified: Secondary | ICD-10-CM
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
## 2021-09-07 ENCOUNTER — Encounter: Payer: Self-pay | Admitting: Neurology

## 2021-09-07 ENCOUNTER — Ambulatory Visit: Payer: BC Managed Care – PPO | Admitting: Neurology

## 2021-09-07 DIAGNOSIS — G43709 Chronic migraine without aura, not intractable, without status migrainosus: Secondary | ICD-10-CM

## 2021-09-07 MED ORDER — METHYLPREDNISOLONE 4 MG PO TBPK: ORAL_TABLET | ORAL | 1 refills | Status: DC

## 2021-09-07 MED ORDER — ONABOTULINUMTOXINA 100 UNITS IJ SOLR
155.0000 [IU] | Freq: Once | INTRAMUSCULAR | Status: AC
  Administered 2021-09-07: 155 [IU] via INTRAMUSCULAR

## 2021-09-07 MED ORDER — GABAPENTIN 100 MG PO CAPS
100.0000 mg | ORAL_CAPSULE | Freq: Three times a day (TID) | ORAL | 6 refills | Status: DC | PRN
Start: 2021-09-07 — End: 2023-08-01

## 2021-09-07 NOTE — Progress Notes (Signed)
Consent Form Botulism Toxin Injection For Chronic Migraine   09/07/2021: On Botox migraines have been reduced to 4 migraine days a month and <10 total headache days a month. Exceleent response.   06/08/2021: Baseline 15 headache days a month and 10 migraine days a month. Excellent response, she has > 75% reduction in migraines and headaches monthlyPatient felt her forehead and eyebrows were uncomfortable due to inability to move them, reduced injections 1/2 injections in the corrigators and procerus, did the frontalis very high and was much better. 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 She also has temple pain.   Dry needling gave her a very bad migraine will not order that again. Aimovig gave her constipation and we stopped due to insurance. Doing so well doesn't even need to consider aimovig* at this time 03/16/2021: She had to put her dog down at [place] was 16. She had a very bad migraine.  When she has a migraine we use Ubrelvy. Try getting her aimovig  09/15/2020: Baseline 15 headache days a month and 10 migraine days a month. Excellent response, she has > 75% reduction in migraines and headaches monthly. Discussed acute management prescribed frova, zembrace works quickly but the migraines rebound. Did great on Nurtec and we prescribed and she continues to take. Patient felt her forehead and eyebrows were uncomfortable due to inability to move them, reduced injections 1/2 injections in the corrigators and procerus, did the frontalis very high and was much better. 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 She also has temple pain.   Dry needling gave her a very bad migraine will not order that again. Aimovig gave her constipation and we stopped due to insurance. Doing so well doesn't even need to consider aimovig* at this time   Reviewed orally with patient, additionally  signature is on file:  Botulism toxin has been approved by the Federal drug administration for treatment of chronic migraine. Botulism toxin does not cure chronic migraine and it may not be effective in some patients.  The administration of botulism toxin is accomplished by injecting a small amount of toxin into the muscles of the neck and head. Dosage must be titrated for each individual. Any benefits resulting from botulism toxin tend to wear off after 3 months with a repeat injection required if benefit is to be maintained. Injections are usually done every 3-4 months with maximum effect peak achieved by about 2 or 3 weeks. Botulism toxin is expensive and you should be sure of what costs you will incur resulting from the injection.  The side effects of botulism toxin use for chronic migraine may include:   -Transient, and usually mild, facial weakness with facial injections  -Transient, and usually mild, head or neck weakness with head/neck injections  -Reduction or loss of forehead facial animation due to forehead muscle weakness  -Eyelid drooping  -Dry eye  -Pain at the site of injection or bruising at the site of injection  -Double vision  -Potential unknown long term risks  Contraindications: You should not have Botox if you are pregnant, nursing, allergic to albumin, have an infection, skin condition, or muscle weakness at the site of the injection, or have myasthenia gravis, Lambert-Eaton syndrome, or ALS.  It is also possible that as with any injection, there may be an allergic reaction or no effect from the medication. Reduced effectiveness after repeated injections is sometimes seen  and rarely infection at the injection site may occur. All care will be taken to prevent these side effects. If therapy is given over a long time, atrophy and wasting in the muscle injected may occur. Occasionally the patient's become refractory to treatment because they develop antibodies to the toxin. In  this event, therapy needs to be modified.  I have read the above information and consent to the administration of botulism toxin.    BOTOX PROCEDURE NOTE FOR MIGRAINE HEADACHE    Contraindications and precautions discussed with patient(above). Aseptic procedure was observed and patient tolerated procedure. Procedure performed by Dr. Georgia Dom  The condition has existed for more than 6 months, and pt does not have a diagnosis of ALS, Myasthenia Gravis or Lambert-Eaton Syndrome.  Risks and benefits of injections discussed and pt agrees to proceed with the procedure.  Written consent obtained  These injections are medically necessary. Pt  receives good benefits from these injections. These injections do not cause sedations or hallucinations which the oral therapies may cause.  Indication/Diagnosis: chronic migraine BOTOX(J0585) injection was performed according to protocol by [employer]. 200 units of BOTOX was dissolved into 4 cc NS.   NDC: [address]-28   Description of procedure:  The patient was placed in a sitting position. The standard protocol was used for Botox as follows, with 5 units of Botox injected at each site:   -Procerus muscle, midline injection 2.5 units   -Corrugator muscle, bilateral injection 2.5 units each  -Frontalis muscle, bilateral injection, with 2 sites each side, medial injection was performed in the upper one third of the frontalis muscle, in the region vertical from the medial inferior edge of the superior orbital rim. The lateral injection was again in the upper one third of the forehead vertically above the lateral limbus of the cornea, 1.5 cm lateral to the medial injection site.  -Temporalis muscle injection, 4 sites, bilaterally. The first injection was 3 cm above the tragus of the ear, second injection site was 1.5 cm to 3 cm up from the first injection site in line with the tragus of the ear. The third injection site was 1.5-3 cm forward between the  first 2 injection sites. The fourth injection site was 1.5 cm posterior to the second injection site. .  -Occipitalis muscle injection, 3 sites, bilaterally. The first injection was done one half way between the occipital protuberance and the tip of the mastoid process behind the ear. The second injection site was done lateral and superior to the first, 1 fingerbreadth from the first injection. The third injection site was 1 fingerbreadth superiorly and medially from the first injection site.  -Cervical paraspinal muscle injection, 2 sites, bilateral knee first injection site was 1 cm from the midline of the cervical spine, 3 cm inferior to the lower border of the occipital protuberance. The second injection site was 1.5 cm superiorly and laterally to the first injection site.  -Trapezius muscle injection was performed at 3 sites, bilaterally. The first injection site was in the upper trapezius muscle halfway between the inflection point of the neck, and the acromion. The second injection site was one half way between the acromion and the first injection site. The third injection was done between the first injection site and the inflection point of the neck.   Will return for repeat injection in 3 months.   155 unit sof Botox was used, 45 Botox was wasted. The patient tolerated the procedure well, there were no complications of the above procedure.

## 2021-09-07 NOTE — Progress Notes (Signed)
Botox- 100 units x 2 vials Lot: [id number] Expiration: 02/2024 NDC: 8546-2703-50  Bacteriostatic 0.9% Sodium Chloride- 65m total Lot: [id number]: 08/11/2022 NDC: [address]-67 Dx: [id number]/B

## 2021-09-16 ENCOUNTER — Encounter: Payer: Self-pay | Admitting: Professional

## 2021-09-16 ENCOUNTER — Ambulatory Visit (INDEPENDENT_AMBULATORY_CARE_PROVIDER_SITE_OTHER): Payer: BC Managed Care – PPO | Admitting: Professional

## 2021-09-16 DIAGNOSIS — F411 Generalized anxiety disorder: Secondary | ICD-10-CM | POA: Diagnosis not present

## 2021-09-16 DIAGNOSIS — F319 Bipolar disorder, unspecified: Secondary | ICD-10-CM | POA: Diagnosis not present

## 2021-09-16 DIAGNOSIS — J4541 Moderate persistent asthma with (acute) exacerbation: Secondary | ICD-10-CM | POA: Diagnosis not present

## 2021-09-16 NOTE — Progress Notes (Signed)
[place] Counselor/Therapist Progress Note  Patient ID: Margaret Lopez, MRN: [id number],    Date: 09/16/2021  Time Spent: 34 minutes 102-136pm  Treatment Type: Individual Therapy  Risk Assessment: Danger to Self:  No Self-injurious Behavior: No Danger to Others: No  Subjective: This session was held via video teletherapy. The patient consented to video teletherapy and was located at her home during this session. She is aware it is the responsibility of the patient to secure confidentiality on her end of the session. The provider was in a private home office for the duration of this session.   The patient arrived on time for her webex appointment.  Issues addressed: 1-personal a-pt has cold and is hoarse b-pt has been feeling less depressed and anxious c-sister living with mother -husband decided to leave her -pt reports that her house was always perfect and she would be embarrassed if she were to come in her home -pt and partner cleaned out some unnecessary clutter so that she would not feel as embarrassed d-appraisal class -pt was given 15 months to complete and has only gotten 50% of the course completed and only has until December -she has come to terms that she cannot complete -pt doesn't feel she has the time because she is stretched so think between work and family   -pt admits that she is sabotaging herself   -pt feels uncertain how she could do appraising based on the needs of her children -pt admits she doubts herself and her ability to complete -discussed possible strategies to help motivate herself to follow through and complete the course   -consider going to [employer] for part of her Thursday day off d-children -Logan back in school and this is helpful -summer was very difficult with Logan's behaviors and his mother's lack of participation -Rolla Plate is going to the [facility] after school and he has gone two weeks without issues   -today is first day his  group is going swimming and she and his father are nervous given that he has pooped in the pool before     -he did not appear embarrassed     -if this occurs she will have to pick him up on Thursdays E-inattention -completed Adult ADHD Self-Report Scale -pt scored 15/18 of the symptoms  Treatment Plan Problems Addressed  Anxiety, Balancing Work and Family/Multiple Roles, Body Image Disturbance/Eating Disorders, Low Self-Esteem/Lack of Assertiveness  Goals 1. Address and eliminate maladaptive thought processes that lead to anxious responses. Objective Identify the relationship between multiple roles, role overload, role strain, and anxiety reactions. Target Date: 2022-08-12 Frequency: Every three weeks to Monthly  Progress: 0 Modality: individual  Objective Learn and practice methods of reducing anxiety in a variety of situations. Target Date: 2022-08-12 Frequency: Every three weeks to Monthly  Progress: 60 Modality: individual  Objective Self-correct maladaptive thought patterns preemptively in order to lessen or eliminate anxiety at least 90% of the time; increase positive self-talk. Target Date: 2022-08-12 Frequency: Every three weeks to Monthly  Progress: 60 Modality: individual  Related Interventions Collaborate with the client to determine what kind of changes need to be made, internally and externally, for calmness and satisfaction to exist within the arenas of work, home, and school. 2. Develop a realistic perspective regarding demands and obligations of multiple roles and their completion. 3. Develop effective coping strategies to deal with interpersonal stressors and emotional issues. 4. Develop time management strategies to reduce role overload and role conflicts. 5. Eliminate depressive and anxiety  symptoms associated with trying to balance multiple roles. 6. Enhance ability to handle effectively life stressors. 7. Increase ability to express needs and desires openly and  honestly. 8. Increase assertiveness skills and ability to advocate for self. 9. Increase involvement in activities that foster confidence and a sense of accomplishment. 10. Increase openness to experiences and opportunities associated with risk-taking. Objective Identify three roadblocks to improved self-esteem. Target Date: 2022-08-12 Frequency: Every three weeks to Monthly  Progress: 30 Modality: individual  Objective Verbalize an understanding of the differences between passive, assertive, and aggressive behavior. Target Date: 2022-08-12 Frequency: Every three weeks to Monthly  Progress: 70 Modality: individual  Related Interventions Assist the client in identifying negative beliefs about herself. Assist the client in becoming aware of how she indirectly expresses negative feelings about self (e.g., lack of eye contact, social withdrawal, sweating, expectations of failure or rejection). Use cognitive-restructuring techniques to change the client's belief system (i.e., via reality-testing and rational thought) toward a more positive, realistic self-perception. Encourage participation in physical activities that foster confidence and well-being (e.g., yoga, meditation, martial arts). Explore the client's fears of being assertive (e.g., fear of rejection, fear of ineffectiveness, fear of ridicule). Explore areas of competency with the client and encourage her to engage in related activities (e.g., dance, singing, arts, volunteer work, joining a book club). Explore with the client avenues for receiving emotional support from others; connect her with further services such as women's groups when necessary. Obtain feedback on any difficulties the client experiences in application and provide tips for integrating methods into his/her everyday routine. Explore with the client her expectations regarding the balance between work and family, and discuss how this relates to internalized gender role  stereotypes. Assign the client to conduct family meetings regularly (e.g., possibly biweekly or monthly) with children (ages 107 and up) and partner to delegate household responsibilities. Use behavioral techniques (i.e., education, modeling, role-playing, corrective feedback, positive reinforcement) to teach the couple problem-solving and conflict-resolution skills, including defining the problem constructively and specifically, brainstorming options, evaluating options, compromise, choosing options and implementing a plan, and evaluating the results. Assist the client in developing more adaptive strategies and behaviors (e.g., relaxation, read a book, call a friend) to cope with uncomfortable emotional states. Encourage the client to develop competencies in other areas of life (e.g., sports, hobbies, educational activities) to increase feelings of self-worth and self-esteem. Assist the client in identifying cues that lead to unhealthy eating by assigning her to write a daily journal of eating behavior, thoughts, and feelings. Objective Decrease the frequency of making self-disparaging remarks. Target Date: 2022-08-12 Frequency: Every three weeks to Monthly  Progress: 70 Modality: individual  Related Interventions Encourage the client to make at least one positive statement about herself during the therapy session. Assist the client in developing a list of positive affirmations to be read three times a day or to be put on a mirror at home. Ask the client to make a list including positive qualities about herself and accomplishments; verbally reinforce positive self-statements. 11. Increase overall sense of well-being via reduction/elimination of anxiety. 12. Maintain a balance between the multiple demands of motherhood, work, and other life roles and responsibilities. Objective Generate a list of self-care activities and make a commitment to regularly participate in such activities. Target Date:  2022-08-12 Frequency: Every three weeks to Monthly  Progress: 0 Modality: individual  Objective Communicate needs with partner regarding multiple role obligations. Target Date: 2022-08-12 Frequency: Every three weeks to Monthly  Progress: 50 Modality: individual  Objective Identify at least five practical ways to manage stress associated with multiple demands. Target Date: 2022-08-12 Frequency: Every three weeks to Monthly  Progress: 60 Modality: individual  Objective Learn and implement stress management and relaxation techniques to reduce fatigue, anxiety, and depressive symptoms. Target Date: 2022-08-12 Frequency: Every three weeks to Monthly  Progress: 40 Modality: individual  13. Reduce self-disparaging remarks and negative self-talk.  Diagnosis:Generalized anxiety disorder  Bipolar 1 disorder, depressed ([place])  Plan:  -meet again on Thursday, October 14, 2021 at [place], [facility] ([place])

## 2021-09-20 ENCOUNTER — Telehealth: Payer: Self-pay

## 2021-09-20 NOTE — Telephone Encounter (Signed)
Received Roselyn Meier PA request. Completed via CMM. Sent to [employer]. Should have a determination within 3-5 business days. Key: [id number].

## 2021-09-21 NOTE — Telephone Encounter (Signed)
PA for [place] approved by [employer]. "Effective from 09/20/2021 through 09/19/2022."

## 2021-10-05 ENCOUNTER — Encounter: Payer: Self-pay | Admitting: Neurology

## 2021-10-07 ENCOUNTER — Telehealth (HOSPITAL_COMMUNITY): Payer: BC Managed Care – PPO | Admitting: Psychiatry

## 2021-10-14 ENCOUNTER — Encounter: Payer: Self-pay | Admitting: Professional

## 2021-10-14 ENCOUNTER — Telehealth (HOSPITAL_BASED_OUTPATIENT_CLINIC_OR_DEPARTMENT_OTHER): Payer: BC Managed Care – PPO | Admitting: Psychiatry

## 2021-10-14 ENCOUNTER — Ambulatory Visit (INDEPENDENT_AMBULATORY_CARE_PROVIDER_SITE_OTHER): Payer: BC Managed Care – PPO | Admitting: Professional

## 2021-10-14 DIAGNOSIS — F319 Bipolar disorder, unspecified: Secondary | ICD-10-CM | POA: Diagnosis not present

## 2021-10-14 DIAGNOSIS — F411 Generalized anxiety disorder: Secondary | ICD-10-CM

## 2021-10-14 MED ORDER — DIVALPROEX SODIUM ER 500 MG PO TB24: 1500.0000 mg | ORAL_TABLET | Freq: Every day | ORAL | 0 refills | Status: DC

## 2021-10-14 MED ORDER — BUPROPION HCL ER (XL) 300 MG PO TB24: 300.0000 mg | ORAL_TABLET | ORAL | 0 refills | Status: DC

## 2021-10-14 MED ORDER — LITHIUM CARBONATE ER 300 MG PO TBCR: 600.0000 mg | EXTENDED_RELEASE_TABLET | Freq: Every evening | ORAL | 0 refills | Status: DC

## 2021-10-14 NOTE — Progress Notes (Signed)
Virtual Visit via PhoneNote  I connected with Margaret Lopez on 10/14/21 at 10:20 AM EDT by phone. We attempted to connect by a  video enabled telemedicine application but it froze 3 times so we opted to continue by phone. I verified that I am speaking with the correct person using two identifiers.  Location: Patient: home Provider: office   I discussed the limitations of evaluation and management by telemedicine and the availability of in person appointments. The patient expressed understanding and agreed to proceed.  History of Present Illness: "I'm doing better I think". Jaton states she last few months have been stressful. Yeilin shares her anger is "medium to medium high mostly out of frustration". She is still eating more due to the stress. She has not been taking Xanax. She denies any crying spells. She does not feel depressed and denies isolation and anhedonia. Sometimes if she stays inside for several days in a row then her family will take her out for a ride. It helps to do that.  Saina feels mostly just overwhelmed. She is taking a class but can't focus or get thru the material. She wants to become a gemologist. She finished the 1st class easily. She is currently taking the 2nd class and has only completed assignment 10/24.  Genae denies manic and hypomanic like symptoms. She only spends money on food. She denies any impulsive behaviors. She is generally getting about 9-10 hrs/night. She denies SI/HI. She denies AVH. Her therapist suggested that she may have ADHD due to [place] getting overwhelmed by multiple things. She gets frustrated easily. Ebbie has trouble staying on task and getting things done on time. During her early school years she had a lot of trouble focusing. She states her mind would always wonder and she would get frustrated and give up. Her grades were usually C's. She was put into low to low average classes in high school temporarily and did well. She was then put back in regular  classes and started getting C's again. She attempted college [employer]) but dropped out during her 2nd year. She then went to [employer] and that was helpful. She got her GPA and then transferred to [employer] for 1 year but it was not a good experience. After 1 year she dropped it because she couldn't keep up with the material and was getting bad grades. She has been working ever since.    Observations/Objective: Psychiatric Specialty Exam: ROS  There were no vitals taken for this visit.There is no height or weight on file to calculate BMI.  General Appearance: Casual and Fairly Groomed  Eye Contact:  Good  Speech:  Clear and Coherent and Normal Rate  Volume:  Normal  Mood:  Anxious  Affect:  Blunt  Thought Process:  Coherent and Descriptions of Associations: Circumstantial  Orientation:  Full (Time, Place, and Person)  Thought Content:  Rumination  Suicidal Thoughts:  No  Homicidal Thoughts:  No  Memory:  Immediate;   Good  Judgement:  Good  Insight:  Good  Psychomotor Activity:  Normal  Concentration:  Concentration: Good  Recall:  Good  Fund of Knowledge:  Good  Language:  Good  Akathisia:  No  Handed:  Right  AIMS (if indicated):     Assets:  Communication Skills Desire for Improvement Financial Resources/Insurance Housing Intimacy Leisure Time Resilience Social Support Talents/Skills Transportation Vocational/Educational  ADL's:  Intact  Cognition:  WNL  Sleep:        Assessment and Plan:  10/14/2021   10:27 AM 07/15/2021    9:07 AM 04/29/2021   10:42 AM 02/04/2021   10:12 AM 11/12/2020   10:43 AM  Depression screen PHQ 2/9  Decreased Interest 0 0 1 0 0  Down, Depressed, Hopeless 0 0 1 0 0  PHQ - 2 Score 0 0 2 0 0  Altered sleeping   0    Tired, decreased energy   1    Change in appetite   0    Feeling bad or failure about yourself    0    Trouble concentrating   0    Moving slowly or fidgety/restless   0    Suicidal thoughts   0    PHQ-9 Score   3     Difficult doing work/chores   Somewhat difficult      Flowsheet Row Video Visit from 10/14/2021 in [facility] Video Visit from 07/15/2021 in [facility] Video Visit from 04/29/2021 in [facility] No Risk No Risk No Risk        Pt is aware that these meds carry a teratogenic risk. Pt will discuss plan of action if she does or plans to become pregnant in the future.  Status of current problems: ongoing anxiety symptoms, reports poor focus.  She does not want to take stimulants but would like to find ways to help improve her focus.   Meds: increase Wellbutrin XL '300mg'$  po qD 1. Bipolar 1 disorder, depressed ([facility]) - buPROPion (WELLBUTRIN XL) 300 MG 24 hr tablet; Take 1 tablet (300 mg total) by mouth every morning.  Dispense: 90 tablet; Refill: 0 - divalproex (DEPAKOTE ER) 500 MG 24 hr tablet; Take 3 tablets (1,500 mg total) by mouth daily.  Dispense: 270 tablet; Refill: 0 - lithium carbonate (LITHOBID) 300 MG CR tablet; Take 2 tablets (600 mg total) by mouth every evening.  Dispense: 180 tablet; Refill: 0  2. Generalized anxiety disorder - buPROPion (WELLBUTRIN XL) 300 MG 24 hr tablet; Take 1 tablet (300 mg total) by mouth every morning.  Dispense: 90 tablet; Refill: 0     Labs: none    Therapy: brief supportive therapy provided. Discussed psychosocial stressors in detail.   She is working with her therapist for mood and anxiety symptoms. I recommended she speak with her therapist to learn coping skills for better focus.    Collaboration of Care: Referral or follow-up with counselor/therapist AEB therapist  Patient/Guardian was advised Release of Information must be obtained prior to any record release in order to collaborate their care with an outside provider. Patient/Guardian was advised if they have not already done so to contact the  registration department to sign all necessary forms in order for [name] to release information regarding their care.   Consent: Patient/Guardian gives verbal consent for treatment and assignment of benefits for services provided during this visit. Patient/Guardian expressed understanding and agreed to proceed.      Follow Up Instructions: Follow up in 2-3 months or sooner if needed    I discussed the assessment and treatment plan with the patient. The patient was provided an opportunity to ask questions and all were answered. The patient agreed with the plan and demonstrated an understanding of the instructions.   The patient was advised to call back or seek an in-person evaluation if the symptoms worsen or if the condition fails to improve as anticipated.  I provided 21 minutes of non-face-to-face  time during this encounter.   Charlcie Cradle, MD
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
## 2021-10-27 ENCOUNTER — Ambulatory Visit: Payer: BC Managed Care – PPO | Admitting: Psychology

## 2021-11-08 ENCOUNTER — Ambulatory Visit: Payer: BC Managed Care – PPO | Admitting: Professional

## 2021-11-18 ENCOUNTER — Encounter: Payer: Self-pay | Admitting: Professional

## 2021-11-18 ENCOUNTER — Ambulatory Visit (INDEPENDENT_AMBULATORY_CARE_PROVIDER_SITE_OTHER): Payer: BC Managed Care – PPO | Admitting: Professional

## 2021-11-18 DIAGNOSIS — F411 Generalized anxiety disorder: Secondary | ICD-10-CM

## 2021-11-18 DIAGNOSIS — F319 Bipolar disorder, unspecified: Secondary | ICD-10-CM
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
## 2021-11-30 ENCOUNTER — Ambulatory Visit: Payer: BC Managed Care – PPO | Admitting: Neurology

## 2021-11-30 DIAGNOSIS — G43709 Chronic migraine without aura, not intractable, without status migrainosus: Secondary | ICD-10-CM

## 2021-11-30 DIAGNOSIS — G43009 Migraine without aura, not intractable, without status migrainosus: Secondary | ICD-10-CM

## 2021-11-30 MED ORDER — ONABOTULINUMTOXINA 200 UNITS IJ SOLR
155.0000 [IU] | Freq: Once | INTRAMUSCULAR | Status: AC
  Administered 2021-11-30: 155 [IU] via INTRAMUSCULAR

## 2021-11-30 NOTE — Progress Notes (Signed)
Botox- 200 units x 1 vial Lot: [id number] Expiration: 04/2024 NDC: 9381-8299-37  Bacteriostatic 0.9% Sodium Chloride- 54m total Lot: [id number]: 09/11/2022 NDC: [address]-10 Dx: [id number]

## 2021-11-30 NOTE — Progress Notes (Signed)
Consent Form Botulism Toxin Injection For Chronic Migraine   Meds ordered this encounter  Medications   botulinum toxin Type A (BOTOX) injection 155 Units    Botox- 200 units x 1 vial Lot: [id number] Expiration: 04/2024 NDC: 7673-4193-79  Bacteriostatic 0.9% Sodium Chloride- 12m total Lot: [id number]: 09/11/2022 NDC: [address]-26 Dx: GS34.196B/B   Rimegepant Sulfate (NURTEC) 75 MG TBDP    Sig: Take 75 mg by mouth daily as needed. For migraines. Take as close to onset of migraine as possible. One daily maximum.    Dispense:  16 tablet    Refill:  11    migraines have been reduced to 4 migraine days a month and <10 total headache days a month. Failed imitre, maxalt, naratriptan, ubelvy     12/05/2021: She is doing excellent. On Botox migraines have been reduced to 4 migraine days a month and <10 total headache days a month. Exceleent response.   09/07/2021: On Botox migraines have been reduced to 4 migraine days a month and <10 total headache days a month. Exceleent response.   06/08/2021: Baseline 15 headache days a month and 10 migraine days a month. Excellent response, she has > 75% reduction in migraines and headaches monthlyPatient felt her forehead and eyebrows were uncomfortable due to inability to move them, reduced injections 1/2 injections in the corrigators and procerus, did the frontalis very high and was much better. 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 She also has temple pain.   Dry needling gave her a very bad migraine will not order that again. Aimovig gave her constipation and we stopped due to insurance. Doing so well doesn't even need to consider aimovig* at this time 03/16/2021: [place].  When she has a migraine we use Ubrelvy. Try getting her aimovig  09/15/2020: Baseline 15 headache days a month and 10 migraine days a  month. Excellent response, she has > 75% reduction in migraines and headaches monthly. Discussed acute management prescribed frova, zembrace works quickly but the migraines rebound. Did great on Nurtec and we prescribed and she continues to take. Patient felt her forehead and eyebrows were uncomfortable due to inability to move them, reduced injections 1/2 injections in the corrigators and procerus, did the frontalis very high and was much better. 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 She also has temple pain.   Dry needling gave her a very bad migraine will not order that again. Aimovig gave her constipation and we stopped due to insurance. Doing so well doesn't even need to consider aimovig* at this time   Reviewed orally with patient, additionally signature is on file:  Botulism toxin has been approved by the Federal drug administration for treatment of chronic migraine. Botulism toxin does not cure chronic migraine and it may not be effective in some patients.  The administration of botulism toxin is accomplished by injecting a small amount of toxin into the muscles of the neck and head. Dosage must be titrated for each individual. Any benefits resulting from botulism toxin tend to wear off after 3 months with a repeat injection required if benefit is to be maintained. Injections are usually done every 3-4 months with maximum effect peak achieved by about 2 or 3 weeks. Botulism toxin is expensive and you should be sure of what costs  you will incur resulting from the injection.  The side effects of botulism toxin use for chronic migraine may include:   -Transient, and usually mild, facial weakness with facial injections  -Transient, and usually mild, head or neck weakness with head/neck injections  -Reduction or loss of forehead facial animation due to forehead muscle weakness  -Eyelid drooping  -Dry eye  -Pain at the site of injection or  bruising at the site of injection  -Double vision  -Potential unknown long term risks  Contraindications: You should not have Botox if you are pregnant, nursing, allergic to albumin, have an infection, skin condition, or muscle weakness at the site of the injection, or have myasthenia gravis, Lambert-Eaton syndrome, or ALS.  It is also possible that as with any injection, there may be an allergic reaction or no effect from the medication. Reduced effectiveness after repeated injections is sometimes seen and rarely infection at the injection site may occur. All care will be taken to prevent these side effects. If therapy is given over a long time, atrophy and wasting in the muscle injected may occur. Occasionally the patient's become refractory to treatment because they develop antibodies to the toxin. In this event, therapy needs to be modified.  I have read the above information and consent to the administration of botulism toxin.    BOTOX PROCEDURE NOTE FOR MIGRAINE HEADACHE    Contraindications and precautions discussed with patient(above). Aseptic procedure was observed and patient tolerated procedure. Procedure performed by Dr. Georgia Dom  The condition has existed for more than 6 months, and pt does not have a diagnosis of ALS, Myasthenia Gravis or Lambert-Eaton Syndrome.  Risks and benefits of injections discussed and pt agrees to proceed with the procedure.  Written consent obtained  These injections are medically necessary. Pt  receives good benefits from these injections. These injections do not cause sedations or hallucinations which the oral therapies may cause.  Indication/Diagnosis: chronic migraine BOTOX(J0585) injection was performed according to protocol by [employer]. 200 units of BOTOX was dissolved into 4 cc NS.   NDC: [address]-95   Description of procedure:  The patient was placed in a sitting position. The standard protocol was used for Botox as follows, with 5  units of Botox injected at each site:   -Procerus muscle, midline injection 2.5 units   -Corrugator muscle, bilateral injection 2.5 units each  -Frontalis muscle, bilateral injection, with 2 sites each side, medial injection was performed in the upper one third of the frontalis muscle, in the region vertical from the medial inferior edge of the superior orbital rim. The lateral injection was again in the upper one third of the forehead vertically above the lateral limbus of the cornea, 1.5 cm lateral to the medial injection site.  -Temporalis muscle injection, 4 sites, bilaterally. The first injection was 3 cm above the tragus of the ear, second injection site was 1.5 cm to 3 cm up from the first injection site in line with the tragus of the ear. The third injection site was 1.5-3 cm forward between the first 2 injection sites. The fourth injection site was 1.5 cm posterior to the second injection site. .  -Occipitalis muscle injection, 3 sites, bilaterally. The first injection was done one half way between the occipital protuberance and the tip of the mastoid process behind the ear. The second injection site was done lateral and superior to the first, 1 fingerbreadth from the first injection. The third injection site was 1 fingerbreadth superiorly and medially from  the first injection site.  -Cervical paraspinal muscle injection, 2 sites, bilateral knee first injection site was 1 cm from the midline of the cervical spine, 3 cm inferior to the lower border of the occipital protuberance. The second injection site was 1.5 cm superiorly and laterally to the first injection site.  -Trapezius muscle injection was performed at 3 sites, bilaterally. The first injection site was in the upper trapezius muscle halfway between the inflection point of the neck, and the acromion. The second injection site was one half way between the acromion and the first injection site. The third injection was done between the  first injection site and the inflection point of the neck.   Will return for repeat injection in 3 months.   155 unit sof Botox was used, 45 Botox was wasted. The patient tolerated the procedure well, there were no complications of the above procedure.

## 2021-12-01 ENCOUNTER — Ambulatory Visit: Payer: BC Managed Care – PPO | Admitting: Neurology

## 2021-12-05 DIAGNOSIS — G43709 Chronic migraine without aura, not intractable, without status migrainosus: Secondary | ICD-10-CM | POA: Insufficient documentation

## 2021-12-05 DIAGNOSIS — G43009 Migraine without aura, not intractable, without status migrainosus: Secondary | ICD-10-CM | POA: Insufficient documentation

## 2021-12-05 MED ORDER — NURTEC 75 MG PO TBDP: 75.0000 mg | ORAL_TABLET | Freq: Every day | ORAL | 11 refills | Status: DC | PRN

## 2021-12-07 ENCOUNTER — Ambulatory Visit: Payer: BC Managed Care – PPO | Admitting: Neurology

## 2021-12-10 ENCOUNTER — Encounter (HOSPITAL_COMMUNITY): Payer: Self-pay | Admitting: *Deleted

## 2021-12-13 ENCOUNTER — Encounter: Payer: Self-pay | Admitting: Professional

## 2021-12-13 ENCOUNTER — Ambulatory Visit (INDEPENDENT_AMBULATORY_CARE_PROVIDER_SITE_OTHER): Payer: BC Managed Care – PPO | Admitting: Professional

## 2021-12-13 DIAGNOSIS — F319 Bipolar disorder, unspecified: Secondary | ICD-10-CM | POA: Diagnosis not present

## 2021-12-13 DIAGNOSIS — F411 Generalized anxiety disorder: Secondary | ICD-10-CM | POA: Diagnosis not present

## 2021-12-13 NOTE — Progress Notes (Signed)
[place] Counselor/Therapist Progress Note  Patient ID: Margaret Lopez, MRN: [id number],    Date: 12/13/2021  Time Spent: 49 minutes [id number]  Treatment Type: Individual Therapy  Risk Assessment: Danger to Self:  No Self-injurious Behavior: No Danger to Others: No  Subjective: This session was held via video teletherapy. The patient consented to video teletherapy and was located at her home during this session. She is aware it is the responsibility of the patient to secure confidentiality on her end of the session. The provider was in a private home office for the duration of this session.   The patient arrived on time for her webex appointment.  Issues addressed: 1-homework -accomplish one personal task in the morning and afternoon each day medication -she has noticed that it has helped her to feel better -she has not been as consistent with the medication as she needs to be -she has an alarm set to give the dog his medicine and she is to take hers at the same time   -she has been giving the dog his medication but not taking hers 2-Logan -has attended his therapy, he likes it, and it has been helpful -he still has episodes but not as often -the number of days has decreased -she communicates with whatever parent has brought to appt -pt and her spouse both have only positives to say 3-sons [name] and Thurmond Butts a-Harrison is being more responsible and has become a "neat knick" since he is spending his own money b-Ryan is still somewhat a mess -she is trying to help him get on track with his medication 4-patient's issues a-pt loves working from home but wonders if working outside the home would be better b-it has began creating some tension for her c-pt thinks she needs to work with a different team and is not very friendly d-she has increased her hours and there are a lot of differences -pt feels that she doesn't get treated fairly since she is the only white  person on her team -pt worries about making people mad and want to respect her supervisors   -she was angry last week with her when she called her on teams and was given a list of what needed to be completed   -she is being told she is not working swiftly enough d-pt admits that it scares her about what that would look like with Logan's issues e-pt should fall into medicaid expansion eligibility f-pt feels overwhelmed with work, the boys, and her parents are aging and needing more -she is feeling the crunch with groceries g-pt needs to find other work opportunities because she is not happy -discussed how to begin her job search  Treatment Plan Problems Addressed  Anxiety, Balancing Work and [employer], Body Image Disturbance/Eating Disorders, Low Self-Esteem/Lack of Assertiveness  Goals 1. Address and eliminate maladaptive thought processes that lead to anxious responses. Objective Identify the relationship between multiple roles, role overload, role strain, and anxiety reactions. Target Date: 2022-08-12 Frequency: Every three weeks to Monthly  Progress: 0 Modality: individual  Objective Learn and practice methods of reducing anxiety in a variety of situations. Target Date: 2022-08-12 Frequency: Every three weeks to Monthly  Progress: 60 Modality: individual  Objective Self-correct maladaptive thought patterns preemptively in order to lessen or eliminate anxiety at least 90% of the time; increase positive self-talk. Target Date: 2022-08-12 Frequency: Every three weeks to Monthly  Progress: 60 Modality: individual  Related Interventions Collaborate with the client to determine what kind of  changes need to be made, internally and externally, for calmness and satisfaction to exist within the arenas of work, home, and school. 2. Develop a realistic perspective regarding demands and obligations of multiple roles and their completion. 3. Develop effective coping strategies to deal  with interpersonal stressors and emotional issues. 4. Develop time management strategies to reduce role overload and role conflicts. 5. Eliminate depressive and anxiety symptoms associated with trying to balance multiple roles. 6. Enhance ability to handle effectively life stressors. 7. Increase ability to express needs and desires openly and honestly. 8. Increase assertiveness skills and ability to advocate for self. 9. Increase involvement in activities that foster confidence and a sense of accomplishment. 10. Increase openness to experiences and opportunities associated with risk-taking. Objective Identify three roadblocks to improved self-esteem. Target Date: 2022-08-12 Frequency: Every three weeks to Monthly  Progress: 30 Modality: individual  Objective Verbalize an understanding of the differences between passive, assertive, and aggressive behavior. Target Date: 2022-08-12 Frequency: Every three weeks to Monthly  Progress: 70 Modality: individual  Related Interventions Assist the client in identifying negative beliefs about herself. Assist the client in becoming aware of how she indirectly expresses negative feelings about self (e.g., lack of eye contact, social withdrawal, sweating, expectations of failure or rejection). Use cognitive-restructuring techniques to change the client's belief system (i.e., via reality-testing and rational thought) toward a more positive, realistic self-perception. Encourage participation in physical activities that foster confidence and well-being (e.g., yoga, meditation, martial arts). Explore the client's fears of being assertive (e.g., fear of rejection, fear of ineffectiveness, fear of ridicule). Explore areas of competency with the client and encourage her to engage in related activities (e.g., dance, singing, arts, volunteer work, joining a book club). Explore with the client avenues for receiving emotional support from others; connect her with  further services such as women's groups when necessary. Obtain feedback on any difficulties the client experiences in application and provide tips for integrating methods into his/her everyday routine. Explore with the client her expectations regarding the balance between work and family, and discuss how this relates to internalized gender role stereotypes. Assign the client to conduct family meetings regularly (e.g., possibly biweekly or monthly) with children (ages 4 and up) and partner to delegate household responsibilities. Use behavioral techniques (i.e., education, modeling, role-playing, corrective feedback, positive reinforcement) to teach the couple problem-solving and conflict-resolution skills, including defining the problem constructively and specifically, brainstorming options, evaluating options, compromise, choosing options and implementing a plan, and evaluating the results. Assist the client in developing more adaptive strategies and behaviors (e.g., relaxation, read a book, call a friend) to cope with uncomfortable emotional states. Encourage the client to develop competencies in other areas of life (e.g., sports, hobbies, educational activities) to increase feelings of self-worth and self-esteem. Assist the client in identifying cues that lead to unhealthy eating by assigning her to write a daily journal of eating behavior, thoughts, and feelings. Objective Decrease the frequency of making self-disparaging remarks. Target Date: 2022-08-12 Frequency: Every three weeks to Monthly  Progress: 70 Modality: individual  Related Interventions Encourage the client to make at least one positive statement about herself during the therapy session. Assist the client in developing a list of positive affirmations to be read three times a day or to be put on a mirror at home. Ask the client to make a list including positive qualities about herself and accomplishments; verbally reinforce positive  self-statements. 11. Increase overall sense of well-being via reduction/elimination of anxiety. 12. Maintain a balance between  the multiple demands of motherhood, work, and other life roles and responsibilities. Objective Generate a list of self-care activities and make a commitment to regularly participate in such activities. Target Date: 2022-08-12 Frequency: Every three weeks to Monthly  Progress: 0 Modality: individual  Objective Communicate needs with partner regarding multiple role obligations. Target Date: 2022-08-12 Frequency: Every three weeks to Monthly  Progress: 50 Modality: individual  Objective Identify at least five practical ways to manage stress associated with multiple demands. Target Date: 2022-08-12 Frequency: Every three weeks to Monthly  Progress: 60 Modality: individual  Objective Learn and implement stress management and relaxation techniques to reduce fatigue, anxiety, and depressive symptoms. Target Date: 2022-08-12 Frequency: Every three weeks to Monthly  Progress: 40 Modality: individual  13. Reduce self-disparaging remarks and negative self-talk.  Diagnosis:Bipolar 1 disorder, depressed ([place])  Generalized anxiety disorder  R/O ADHD  Plan:  -begin looking for other employment -meet again on Thursday, January 27, 2022 at 2pm.

## 2022-01-06 ENCOUNTER — Telehealth (HOSPITAL_BASED_OUTPATIENT_CLINIC_OR_DEPARTMENT_OTHER): Payer: BC Managed Care – PPO | Admitting: Psychiatry

## 2022-01-06 ENCOUNTER — Encounter (HOSPITAL_COMMUNITY): Payer: Self-pay | Admitting: Psychiatry

## 2022-01-06 DIAGNOSIS — F319 Bipolar disorder, unspecified: Secondary | ICD-10-CM

## 2022-01-06 DIAGNOSIS — F411 Generalized anxiety disorder: Secondary | ICD-10-CM

## 2022-01-06 MED ORDER — BUPROPION HCL ER (XL) 300 MG PO TB24: 300.0000 mg | ORAL_TABLET | ORAL | 0 refills | Status: DC

## 2022-01-06 MED ORDER — LITHIUM CARBONATE ER 300 MG PO TBCR: 600.0000 mg | EXTENDED_RELEASE_TABLET | Freq: Every evening | ORAL | 0 refills | Status: DC

## 2022-01-06 MED ORDER — DIVALPROEX SODIUM ER 500 MG PO TB24: 1500.0000 mg | ORAL_TABLET | Freq: Every day | ORAL | 0 refills | Status: DC

## 2022-01-06 NOTE — Progress Notes (Signed)
Virtual Visit via Video Note  I connected with Margaret Lopez on 01/06/22 at 10:00 AM EST by a video enabled telemedicine application and verified that I am speaking with the correct person using two identifiers.  Location: Patient: home Provider: office   I discussed the limitations of evaluation and management by telemedicine and the availability of in person appointments. The patient expressed understanding and agreed to proceed.  History of Present Illness: Cybele shares she is doing well. The holidays have been busy but enjoyable. Her mood has been stable. Jaloni denies sad mood,anhedonia, isolation and hopelessness. She denies SI/HI. Danijah denies any manic or hypomanic like symptoms. Her sleep is good. Appetite and energy are good. Her anxiety has been a little high due to hosting several family members. She takes Xanax about 1-2x/month or less. Meditation and medication both help. Her fiance has been helping her to take her Wellbutrin daily. She was taking it inconsistently for several weeks.  She has been attending her therapy sessions and it is going well.    Observations/Objective: Psychiatric Specialty Exam: ROS  There were no vitals taken for this visit.There is no height or weight on file to calculate BMI.  General Appearance: Casual  Eye Contact:  Good  Speech:  Clear and Coherent and Normal Rate  Volume:  Normal  Mood:  Euthymic  Affect:  Full Range  Thought Process:  Goal Directed, Linear, and Descriptions of Associations: Intact  Orientation:  Full (Time, Place, and Person)  Thought Content:  Logical  Suicidal Thoughts:  No  Homicidal Thoughts:  No  Memory:  Immediate;   Good  Judgement:  Good  Insight:  Good  Psychomotor Activity:  Normal  Concentration:  Concentration: Good  Recall:  Good  Fund of Knowledge:  Good  Language:  Good  Akathisia:  No  Handed:  Right  AIMS (if indicated):     Assets:  Communication Skills Desire for Improvement Financial  Resources/Insurance Housing Intimacy Leisure Time Resilience Social Support Talents/Skills Transportation Vocational/Educational  ADL's:  Intact  Cognition:  WNL  Sleep:        Assessment and Plan:     10/14/2021   10:27 AM 07/15/2021    9:07 AM 04/29/2021   10:42 AM 02/04/2021   10:12 AM 11/12/2020   10:43 AM  Depression screen PHQ 2/9  Decreased Interest 0 0 1 0 0  Down, Depressed, Hopeless 0 0 1 0 0  PHQ - 2 Score 0 0 2 0 0  Altered sleeping   0    Tired, decreased energy   1    Change in appetite   0    Feeling bad or failure about yourself    0    Trouble concentrating   0    Moving slowly or fidgety/restless   0    Suicidal thoughts   0    PHQ-9 Score   3    Difficult doing work/chores   Somewhat difficult      Flowsheet Row Video Visit from 10/14/2021 in [facility] Video Visit from 07/15/2021 in [facility] Video Visit from 04/29/2021 in [facility] No Risk No Risk No Risk          Pt is aware that these meds carry a teratogenic risk. Pt will discuss plan of action if she does or plans to become pregnant in the future.  Status of current problems: stable mood, mild situational  anxiety   Medication management with supportive therapy. Risks and benefits, side effects and alternative treatment options discussed with patient. Pt was given an opportunity to ask questions about medication, illness, and treatment. All current psychiatric medications have been reviewed and discussed with the patient and adjusted as clinically appropriate.  Pt verbalized understanding and verbal consent obtained for treatment.  Meds: Continue Xanax- no refill today 1. Generalized anxiety disorder - buPROPion (WELLBUTRIN XL) 300 MG 24 hr tablet; Take 1 tablet (300 mg total) by mouth every morning.  Dispense: 90 tablet; Refill: 0  2. Bipolar 1  disorder, depressed ([facility]) - buPROPion (WELLBUTRIN XL) 300 MG 24 hr tablet; Take 1 tablet (300 mg total) by mouth every morning.  Dispense: 90 tablet; Refill: 0 - divalproex (DEPAKOTE ER) 500 MG 24 hr tablet; Take 3 tablets (1,500 mg total) by mouth daily.  Dispense: 270 tablet; Refill: 0 - lithium carbonate (LITHOBID) 300 MG ER tablet; Take 2 tablets (600 mg total) by mouth every evening.  Dispense: 180 tablet; Refill: 0     Labs: at next visit will order labs    Therapy: brief supportive therapy provided. Discussed psychosocial stressors in detail.     Collaboration of Care: Referral or follow-up with counselor/therapist AEB continue therapy  Patient/Guardian was advised Release of Information must be obtained prior to any record release in order to collaborate their care with an outside provider. Patient/Guardian was advised if they have not already done so to contact the registration department to sign all necessary forms in order for [name] to release information regarding their care.   Consent: Patient/Guardian gives verbal consent for treatment and assignment of benefits for services provided during this visit. Patient/Guardian expressed understanding and agreed to proceed.      Follow Up Instructions: Follow up in 2-3 months or sooner if needed    I discussed the assessment and treatment plan with the patient. The patient was provided an opportunity to ask questions and all were answered. The patient agreed with the plan and demonstrated an understanding of the instructions.   The patient was advised to call back or seek an in-person evaluation if the symptoms worsen or if the condition fails to improve as anticipated.  I provided 11 minutes of non-face-to-face time during this encounter.   Charlcie Cradle, MD

## 2022-01-13 ENCOUNTER — Ambulatory Visit (INDEPENDENT_AMBULATORY_CARE_PROVIDER_SITE_OTHER): Payer: Medicaid Other | Admitting: Psychology

## 2022-01-13 DIAGNOSIS — F89 Unspecified disorder of psychological development: Secondary | ICD-10-CM
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
## 2022-01-20 ENCOUNTER — Other Ambulatory Visit: Payer: Self-pay | Admitting: Neurology

## 2022-01-26 ENCOUNTER — Encounter: Payer: Self-pay | Admitting: Neurology

## 2022-01-26 ENCOUNTER — Telehealth: Payer: Self-pay | Admitting: Neurology

## 2022-01-26 NOTE — Telephone Encounter (Signed)
Called pt. She said her insurance has changed and ask if someone can look it up on blueE. Stated she will try to look for card this afternoon.

## 2022-01-26 NOTE — Telephone Encounter (Signed)
Pt is on the schedule for botox injection for 02/22/22 and will need a new PA before appointment. Prior PA expired 01/19/22

## 2022-01-26 NOTE — Telephone Encounter (Signed)
Please call patient and see if there has been any insurance change for 2024. If so, can they send a picture of front and back of card through mychart? If they cannot, please gather pt's insurance name, ID, Group #, and if there are Prescription benefits then we need the Rx ID, BIN, PCN, group numbers, and the provider pre-certification number from the back of the card. Thank you!

## 2022-01-26 NOTE — Telephone Encounter (Signed)
On phone with patient. She states she does have healthy blue medicaid last month and this month. Our billing manager is able to see the ID # [id number]. Patient will upload the information she has to her mychart. She also said her medicaid is being re-certified at the beginning of February. She doesn't expect anything to change but she will submit that insurance information to [name] as well as soon as she gets it. She understands we may not do an auth right now because if something changes with insurance at beginning of February we would have to do another authorization before her appt on 2/13. Margaret Lopez verbalized appreciation for the call. She will be in touch.

## 2022-01-27 ENCOUNTER — Encounter: Payer: Self-pay | Admitting: Professional

## 2022-01-27 ENCOUNTER — Ambulatory Visit: Payer: Medicaid Other | Admitting: Professional

## 2022-01-27 ENCOUNTER — Ambulatory Visit: Payer: Medicaid Other | Admitting: Psychology

## 2022-01-27 ENCOUNTER — Other Ambulatory Visit (HOSPITAL_COMMUNITY): Payer: Self-pay

## 2022-01-27 DIAGNOSIS — F319 Bipolar disorder, unspecified: Secondary | ICD-10-CM

## 2022-01-27 DIAGNOSIS — F89 Unspecified disorder of psychological development: Secondary | ICD-10-CM

## 2022-01-27 DIAGNOSIS — F411 Generalized anxiety disorder: Secondary | ICD-10-CM

## 2022-01-27 NOTE — Telephone Encounter (Signed)
Pt states that her card will not change.  It will be same ID # and everything.  Please reauthorize.   Chronic Migraine CPT 64615  Botox [id number] Units:200  G43.709 Chronic Migraine without aura, not intractable, without status migrainous

## 2022-01-27 NOTE — Progress Notes (Signed)
[facility] Counselor/Therapist Progress Note  Patient ID: Margaret Lopez, MRN: [id number],    Date: 01/27/2022  Time Spent: 49 minutes 201-250pm  Treatment Type: Individual Therapy  Risk Assessment: Danger to Self:  No Self-injurious Behavior: No Danger to Others: No  Subjective: This session was held via video teletherapy. The patient consented to video teletherapy and was located at her home during this session. She is aware it is the responsibility of the patient to secure confidentiality on her end of the session. The provider was in a private home office for the duration of this session.   The patient arrived on time for her webex appointment.  Issues addressed: 1-homework -accomplish one personal task in the morning and afternoon each day medication -she has noticed that it has helped her to feel better -she has not been as consistent with the medication as she needs to be -she has an alarm set to give the dog his medicine and she is to take hers at the same time   -she has been giving the dog his medication but not taking hers 2-Logan -has attended his therapy, he likes it, and it has been helpful -he still has episodes but not as often -the number of days has decreased -she communicates with whatever parent has brought to appt -pt and her spouse both have only positives to say 3-sons [name] and Thurmond Butts a-Harrison is being more responsible and has become a "neat knick" since he is spending his own money b-Ryan is still somewhat a mess -she is trying to help him get on track with his medication 4-patient's issues a-pt loves working from home but wonders if working outside the home would be better b-it has began creating some tension for her c-pt thinks she needs to work with a different team and is not very friendly d-she has increased her hours and there are a lot of differences -pt feels that she doesn't get treated fairly since she is the only white  person on her team -pt worries about making people mad and want to respect her supervisors   -she was angry last week with her when she called her on teams and was given a list of what needed to be completed   -she is being told she is not working swiftly enough d-pt admits that it scares her about what that would look like with Logan's issues e-pt should fall into medicaid expansion eligibility f-pt feels overwhelmed with work, the boys, and her parents are aging and needing more -she is feeling the crunch with groceries g-pt needs to find other work opportunities because she is not happy -discussed how to begin her job search  Treatment Plan Problems Addressed  Anxiety, Balancing Work and [employer], Body Image Disturbance/Eating Disorders, Low Self-Esteem/Lack of Assertiveness  Goals 1. Address and eliminate maladaptive thought processes that lead to anxious responses. Objective Identify the relationship between multiple roles, role overload, role strain, and anxiety reactions. Target Date: 2022-08-12 Frequency: Every three weeks to Monthly  Progress: 0 Modality: individual  Objective Learn and practice methods of reducing anxiety in a variety of situations. Target Date: 2022-08-12 Frequency: Every three weeks to Monthly  Progress: 60 Modality: individual  Objective Self-correct maladaptive thought patterns preemptively in order to lessen or eliminate anxiety at least 90% of the time; increase positive self-talk. Target Date: 2022-08-12 Frequency: Every three weeks to Monthly  Progress: 60 Modality: individual  Related Interventions Collaborate with the client to determine what kind of  changes need to be made, internally and externally, for calmness and satisfaction to exist within the arenas of work, home, and school. 2. Develop a realistic perspective regarding demands and obligations of multiple roles and their completion. 3. Develop effective coping strategies to deal  with interpersonal stressors and emotional issues. 4. Develop time management strategies to reduce role overload and role conflicts. 5. Eliminate depressive and anxiety symptoms associated with trying to balance multiple roles. 6. Enhance ability to handle effectively life stressors. 7. Increase ability to express needs and desires openly and honestly. 8. Increase assertiveness skills and ability to advocate for self. 9. Increase involvement in activities that foster confidence and a sense of accomplishment. 10. Increase openness to experiences and opportunities associated with risk-taking. Objective Identify three roadblocks to improved self-esteem. Target Date: 2022-08-12 Frequency: Every three weeks to Monthly  Progress: 30 Modality: individual  Objective Verbalize an understanding of the differences between passive, assertive, and aggressive behavior. Target Date: 2022-08-12 Frequency: Every three weeks to Monthly  Progress: 70 Modality: individual  Related Interventions Assist the client in identifying negative beliefs about herself. Assist the client in becoming aware of how she indirectly expresses negative feelings about self (e.g., lack of eye contact, social withdrawal, sweating, expectations of failure or rejection). Use cognitive-restructuring techniques to change the client's belief system (i.e., via reality-testing and rational thought) toward a more positive, realistic self-perception. Encourage participation in physical activities that foster confidence and well-being (e.g., yoga, meditation, martial arts). Explore the client's fears of being assertive (e.g., fear of rejection, fear of ineffectiveness, fear of ridicule). Explore areas of competency with the client and encourage her to engage in related activities (e.g., dance, singing, arts, volunteer work, joining a book club). Explore with the client avenues for receiving emotional support from others; connect her with  further services such as women's groups when necessary. Obtain feedback on any difficulties the client experiences in application and provide tips for integrating methods into his/her everyday routine. Explore with the client her expectations regarding the balance between work and family, and discuss how this relates to internalized gender role stereotypes. Assign the client to conduct family meetings regularly (e.g., possibly biweekly or monthly) with children (ages 12 and up) and partner to delegate household responsibilities. Use behavioral techniques (i.e., education, modeling, role-playing, corrective feedback, positive reinforcement) to teach the couple problem-solving and conflict-resolution skills, including defining the problem constructively and specifically, brainstorming options, evaluating options, compromise, choosing options and implementing a plan, and evaluating the results. Assist the client in developing more adaptive strategies and behaviors (e.g., relaxation, read a book, call a friend) to cope with uncomfortable emotional states. Encourage the client to develop competencies in other areas of life (e.g., sports, hobbies, educational activities) to increase feelings of self-worth and self-esteem. Assist the client in identifying cues that lead to unhealthy eating by assigning her to write a daily journal of eating behavior, thoughts, and feelings. Objective Decrease the frequency of making self-disparaging remarks. Target Date: 2022-08-12 Frequency: Every three weeks to Monthly  Progress: 70 Modality: individual  Related Interventions Encourage the client to make at least one positive statement about herself during the therapy session. Assist the client in developing a list of positive affirmations to be read three times a day or to be put on a mirror at home. Ask the client to make a list including positive qualities about herself and accomplishments; verbally reinforce positive  self-statements. 11. Increase overall sense of well-being via reduction/elimination of anxiety. 12. Maintain a balance between  the multiple demands of motherhood, work, and other life roles and responsibilities. Objective Generate a list of self-care activities and make a commitment to regularly participate in such activities. Target Date: 2022-08-12 Frequency: Every three weeks to Monthly  Progress: 0 Modality: individual  Objective Communicate needs with partner regarding multiple role obligations. Target Date: 2022-08-12 Frequency: Every three weeks to Monthly  Progress: 50 Modality: individual  Objective Identify at least five practical ways to manage stress associated with multiple demands. Target Date: 2022-08-12 Frequency: Every three weeks to Monthly  Progress: 60 Modality: individual  Objective Learn and implement stress management and relaxation techniques to reduce fatigue, anxiety, and depressive symptoms. Target Date: 2022-08-12 Frequency: Every three weeks to Monthly  Progress: 40 Modality: individual  13. Reduce self-disparaging remarks and negative self-talk.  Diagnosis:No diagnosis found.  R/O ADHD  Plan:  -consider [facility] meeting to gain support for S.O.'s lapse -contact EMDR therapist Marya Fossa, [facility] -meet again as needed per pt's request

## 2022-01-27 NOTE — Progress Notes (Signed)
Date: 01/27/2022   Appointment Start Time: 9am Duration: 70 minutes Provider: Clarice Pole, PsyD Type of Session: Testing Appointment for Evaluation  Location of Patient: Home Location of Provider: Provider's Home (private office) Type of Contact: WebEx video visit with audio  Session Content: Today's appointment was a telepsychological visit due to COVID-19. Camia is aware it is her responsibility to secure confidentiality on her end of the session. Prior to proceeding with today's appointment, Sharelle's physical location at the time of this appointment was obtained as well a phone number she could be reached at in the event of technical difficulties. Eather denied anyone else being present in the room or on the virtual appointment. This provider reviewed that video should not be captured, photos should not be taken, nor should testing stimuli be copied or recorded as it would be a copyright violation. Eriyana expressed understanding of the aforementioned, and verbally consented to proceed. The WAIS-IV was administered, scored, and interpreted by this evaluator  Billing codes will be input on the feedback appointment. There are no billing codes for the testing appointment.   Provisional DSM-5 diagnosis(es):  F89 Unspecified Disorder of Psychological Development   Plan: Dajia was scheduled for a feedback appointment on 02/03/2022 at 4pm via WebEx video visit with audio.                Dolores Lory, PsyD

## 2022-02-03 ENCOUNTER — Ambulatory Visit (INDEPENDENT_AMBULATORY_CARE_PROVIDER_SITE_OTHER): Payer: Medicaid Other | Admitting: Psychology

## 2022-02-03 DIAGNOSIS — F902 Attention-deficit hyperactivity disorder, combined type: Secondary | ICD-10-CM
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
## 2022-02-08 NOTE — Telephone Encounter (Signed)
Checking in on this auth status, pt is scheduled 02/22/22. Thannk you!

## 2022-02-11 ENCOUNTER — Other Ambulatory Visit (HOSPITAL_COMMUNITY): Payer: Self-pay

## 2022-02-11 NOTE — Telephone Encounter (Signed)
Pharmacy Patient Advocate Encounter  Botox 200UNIT solution Received notification from [facility] that prior authorization for Botox 200UNIT solution is required/requested.    PA submitted on 02/11/2022 to (ins) [facility] Medicaid via CoverMyMeds Key [id number] Status is pending

## 2022-02-14 ENCOUNTER — Other Ambulatory Visit: Payer: Self-pay

## 2022-02-14 ENCOUNTER — Other Ambulatory Visit (HOSPITAL_COMMUNITY): Payer: Self-pay

## 2022-02-14 MED ORDER — BOTOX 100 UNITS IJ SOLR
INTRAMUSCULAR | 3 refills | Status: AC
  Filled 2022-02-14 – 2023-02-07 (×3): qty 1, 84d supply, fill #0

## 2022-02-14 NOTE — Addendum Note (Signed)
Addended by: Brandon Melnick on: 02/14/2022 01:36 PM   Modules accepted: Orders

## 2022-02-14 NOTE — Telephone Encounter (Signed)
Pharmacy Patient Advocate Encounter  Prior Authorization for Botox 200UNIT solution has been approved.    PA#  PA Case ID: [id number] Effective dates: 02/11/2022 through 02/11/2023 Per test billing copay is $4.00 This can be filled with [place]

## 2022-02-15 ENCOUNTER — Other Ambulatory Visit: Payer: Self-pay

## 2022-02-17 ENCOUNTER — Other Ambulatory Visit (HOSPITAL_COMMUNITY): Payer: Self-pay

## 2022-02-22 ENCOUNTER — Other Ambulatory Visit: Payer: Self-pay | Admitting: *Deleted

## 2022-02-22 ENCOUNTER — Ambulatory Visit: Payer: Medicaid Other | Admitting: Neurology

## 2022-02-22 ENCOUNTER — Other Ambulatory Visit: Payer: Self-pay

## 2022-02-22 ENCOUNTER — Other Ambulatory Visit (HOSPITAL_COMMUNITY): Payer: Self-pay

## 2022-02-22 ENCOUNTER — Telehealth: Payer: Self-pay | Admitting: *Deleted

## 2022-02-22 DIAGNOSIS — G43709 Chronic migraine without aura, not intractable, without status migrainosus: Secondary | ICD-10-CM | POA: Diagnosis not present

## 2022-02-22 MED ORDER — KETOROLAC TROMETHAMINE 60 MG/2ML IM SOLN: INTRAMUSCULAR | 4 refills | Status: DC

## 2022-02-22 MED ORDER — BOTOX 100 UNITS IJ SOLR
INTRAMUSCULAR | 2 refills | Status: DC
  Filled 2022-02-22: qty 2, 84d supply, fill #0
  Filled 2022-02-22: qty 2, fill #0
  Filled 2022-02-22: qty 2, 84d supply, fill #0
  Filled 2022-05-04: qty 2, 84d supply, fill #1
  Filled 2022-07-28: qty 2, 84d supply, fill #2

## 2022-02-22 MED ORDER — KETOROLAC TROMETHAMINE 60 MG/2ML IM SOLN
60.0000 mg | Freq: Once | INTRAMUSCULAR | Status: AC
  Administered 2022-02-22: 60 mg via INTRAMUSCULAR

## 2022-02-22 MED ORDER — METHYLPREDNISOLONE 4 MG PO TBPK: ORAL_TABLET | ORAL | 1 refills | Status: DC

## 2022-02-22 MED ORDER — ONABOTULINUMTOXINA 100 UNITS IJ SOLR
155.0000 [IU] | Freq: Once | INTRAMUSCULAR | Status: AC
  Administered 2022-02-22: 155 [IU] via INTRAMUSCULAR

## 2022-02-22 MED ORDER — "BD SAFETYGLIDE SYRINGE/NEEDLE 25G X 1"" 3 ML MISC": 5 refills | Status: DC

## 2022-02-22 NOTE — Telephone Encounter (Signed)
I called [facility] spoke to [place].  She did see that dose was 155units and only 1 vial was sent.  She said that needs new order with correct vial #.  She would reverse the prescription, so as able to to send other vial.  We will use B/B with the vial that we have this afternoon.  (Then replace when receive other).

## 2022-02-22 NOTE — Progress Notes (Signed)
Botox- 100 units x 2 vials Lot: [id number]  Expiration: 06/2024 NDC: [id number]  Bacteriostatic 0.9% Sodium Chloride- 17m total Lot: [id number]: 11/25 NDC: [id number] Dx: [id number]

## 2022-02-22 NOTE — Progress Notes (Signed)
Verbal Order for Toradol 71m /271mby Dr. AhJaynee Eagles Under aseptic technique injected to L upper outer gluteal quadrant.  Bandaid applied. Pt tolerated well.

## 2022-02-22 NOTE — Progress Notes (Signed)
Consent Form Botulism Toxin Injection For Chronic Migraine   Meds ordered this encounter  Medications   botulinum toxin Type A (BOTOX) injection 155 Units    Botox- 100 units x 2 vials Lot: [id number]  Expiration: 06/2024 NDC: [id number]  Bacteriostatic 0.9% Sodium Chloride- 80m total Lot: [id number]: 11/25 NDC: [id number] Dx: [id number]   ketorolac (TORADOL) 60 MG/2ML SOLN injection    Sig: Inject 1-264m(30-6047mintramuscularly at onset of migraine. May repeat in 6 hours. Max twice a day and 4 days per month.    Dispense:  10 mL    Refill:  4   SYRINGE-NEEDLE, DISP, 3 ML (BD SAFETYGLIDE SYRINGE/NEEDLE) 25G X 1" 3 ML MISC    Sig: Attach needle to syringe and use to draw up and administer Toradol. Do not reuse.    Dispense:  4 each    Refill:  5   methylPREDNISolone (MEDROL DOSEPAK) 4 MG TBPK tablet    Sig: Take pills daily all together with food. Take the first dose (6 pills) as soon as possible. Take the rest each morning. For 6 days total 6-5-4-3-2-1.    Dispense:  21 tablet    Refill:  1   02/22/2022: Stable  12/05/2021: She is doing excellent. On Botox migraines have been reduced to 4 migraine days a month and <10 total headache days a month. Exceleent response.   09/07/2021: On Botox migraines have been reduced to 4 migraine days a month and <10 total headache days a month. Exceleent response.   06/08/2021: Baseline 15 headache days a month and 10 migraine days a month. Excellent response, she has > 75% reduction in migraines and headaches monthlyPatient felt her forehead and eyebrows were uncomfortable due to inability to move them, reduced injections 1/2 injections in the corrigators and procerus, did the frontalis very high and was much better. 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 She also has temple pain.   Dry needling gave her a very bad migraine will not order that again. Aimovig gave her  constipation and we stopped due to insurance. Doing so well doesn't even need to consider aimovig* at this time 03/16/2021: She had to [place] her [place] down at [place]. She had a very bad migraine.  When she has a migraine we use Ubrelvy. Try getting her aimovig  09/15/2020: Baseline 15 headache days a month and 10 migraine days a month. Excellent response, she has > 75% reduction in migraines and headaches monthly. Discussed acute management prescribed frova, zembrace works quickly but the migraines rebound. Did great on Nurtec and we prescribed and she continues to take. Patient felt her forehead and eyebrows were uncomfortable due to inability to move them, reduced injections 1/2 injections in the corrigators and procerus, did the frontalis very high and was much better. 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 She also has temple pain.   Dry needling gave her a very bad migraine will not order that again. Aimovig gave her constipation and we stopped due to insurance. Doing so well doesn't even need to consider aimovig* at this time   Reviewed orally with patient, additionally signature is on file:  Botulism toxin has been approved by the Federal drug administration for treatment of chronic migraine. Botulism toxin does not cure chronic migraine and it may not be effective in some patients.  The administration of botulism toxin is  accomplished by injecting a small amount of toxin into the muscles of the neck and head. Dosage must be titrated for each individual. Any benefits resulting from botulism toxin tend to wear off after 3 months with a repeat injection required if benefit is to be maintained. Injections are usually done every 3-4 months with maximum effect peak achieved by about 2 or 3 weeks. Botulism toxin is expensive and you should be sure of what costs you will incur resulting from the injection.  The side effects of botulism  toxin use for chronic migraine may include:   -Transient, and usually mild, facial weakness with facial injections  -Transient, and usually mild, head or neck weakness with head/neck injections  -Reduction or loss of forehead facial animation due to forehead muscle weakness  -Eyelid drooping  -Dry eye  -Pain at the site of injection or bruising at the site of injection  -Double vision  -Potential unknown long term risks  Contraindications: You should not have Botox if you are pregnant, nursing, allergic to albumin, have an infection, skin condition, or muscle weakness at the site of the injection, or have myasthenia gravis, Lambert-Eaton syndrome, or ALS.  It is also possible that as with any injection, there may be an allergic reaction or no effect from the medication. Reduced effectiveness after repeated injections is sometimes seen and rarely infection at the injection site may occur. All care will be taken to prevent these side effects. If therapy is given over a long time, atrophy and wasting in the muscle injected may occur. Occasionally the patient's become refractory to treatment because they develop antibodies to the toxin. In this event, therapy needs to be modified.  I have read the above information and consent to the administration of botulism toxin.    BOTOX PROCEDURE NOTE FOR MIGRAINE HEADACHE    Contraindications and precautions discussed with patient(above). Aseptic procedure was observed and patient tolerated procedure. Procedure performed by Dr. Georgia Dom  The condition has existed for more than 6 months, and pt does not have a diagnosis of ALS, Myasthenia Gravis or Lambert-Eaton Syndrome.  Risks and benefits of injections discussed and pt agrees to proceed with the procedure.  Written consent obtained  These injections are medically necessary. Pt  receives good benefits from these injections. These injections do not cause sedations or hallucinations which the oral  therapies may cause.  Indication/Diagnosis: chronic migraine BOTOX(J0585) injection was performed according to protocol by [employer]. 200 units of BOTOX was dissolved into 4 cc NS.   NDC: [id number]   Description of procedure:  The patient was placed in a sitting position. The standard protocol was used for Botox as follows, with 5 units of Botox injected at each site:   -Procerus muscle, midline injection 2.5 units   -Corrugator muscle, bilateral injection 2.5 units each  -Frontalis muscle, bilateral injection, with 2 sites each side, medial injection was performed in the upper one third of the frontalis muscle, in the region vertical from the medial inferior edge of the superior orbital rim. The lateral injection was again in the upper one third of the forehead vertically above the lateral limbus of the cornea, 1.5 cm lateral to the medial injection site.  -Temporalis muscle injection, 4 sites, bilaterally. The first injection was 3 cm above the tragus of the ear, second injection site was 1.5 cm to 3 cm up from the first injection site in line with the tragus of the ear. The third injection site was 1.5-3 cm forward between  the first 2 injection sites. The fourth injection site was 1.5 cm posterior to the second injection site. .  -Occipitalis muscle injection, 3 sites, bilaterally. The first injection was done one half way between the occipital protuberance and the tip of the mastoid process behind the ear. The second injection site was done lateral and superior to the first, 1 fingerbreadth from the first injection. The third injection site was 1 fingerbreadth superiorly and medially from the first injection site.  -Cervical paraspinal muscle injection, 2 sites, bilateral knee first injection site was 1 cm from the midline of the cervical spine, 3 cm inferior to the lower border of the occipital protuberance. The second injection site was 1.5 cm superiorly and laterally to the first  injection site.  -Trapezius muscle injection was performed at 3 sites, bilaterally. The first injection site was in the upper trapezius muscle halfway between the inflection point of the neck, and the acromion. The second injection site was one half way between the acromion and the first injection site. The third injection was done between the first injection site and the inflection point of the neck.   Will return for repeat injection in 3 months.   155 unit sof Botox was used, 45 Botox was wasted. The patient tolerated the procedure well, there were no complications of the above procedure.

## 2022-02-23 NOTE — Telephone Encounter (Signed)
Replacement vial here, placed in B/B section of fridge.

## 2022-03-31 ENCOUNTER — Encounter (HOSPITAL_COMMUNITY): Payer: Self-pay | Admitting: Psychiatry

## 2022-03-31 ENCOUNTER — Telehealth (HOSPITAL_BASED_OUTPATIENT_CLINIC_OR_DEPARTMENT_OTHER): Payer: Medicaid Other | Admitting: Psychiatry

## 2022-03-31 DIAGNOSIS — Z5181 Encounter for therapeutic drug level monitoring: Secondary | ICD-10-CM | POA: Diagnosis not present

## 2022-03-31 DIAGNOSIS — F319 Bipolar disorder, unspecified: Secondary | ICD-10-CM | POA: Diagnosis not present

## 2022-03-31 DIAGNOSIS — F411 Generalized anxiety disorder: Secondary | ICD-10-CM | POA: Diagnosis not present

## 2022-03-31 MED ORDER — BUPROPION HCL ER (XL) 300 MG PO TB24: 300.0000 mg | ORAL_TABLET | ORAL | 0 refills | Status: DC

## 2022-03-31 MED ORDER — LITHIUM CARBONATE ER 300 MG PO TBCR: 600.0000 mg | EXTENDED_RELEASE_TABLET | Freq: Every evening | ORAL | 0 refills | Status: DC

## 2022-03-31 MED ORDER — DIVALPROEX SODIUM ER 500 MG PO TB24: 1500.0000 mg | ORAL_TABLET | Freq: Every day | ORAL | 0 refills | Status: DC

## 2022-03-31 NOTE — Progress Notes (Signed)
Virtual Visit via Video Note  I connected with AILEEN BOGHOSSIAN on 03/31/22 at 10:00 AM EDT by  a video enabled telemedicine application and verified that I am speaking with the correct person using two identifiers.  Location: Patient: Home Provider: office   I discussed the limitations of evaluation and management by telemedicine and the availability of in person appointments. The patient expressed understanding and agreed to proceed.  History of Present Illness: Tyreonna shares she is doing good. The time change has taken some time to deal with. Arvetta feels the meds are working well and denies SE. Her anxiety is constant but feels her response is typical for anyone with her current stressors. She notes that she tends to isolate more and thinks it is due to the pandemic. She denies depression and hopelessness. She has lost interest in some things but is not concerned about it. Kayleigh denies SI/HI. Pt denies recent manic and hypomanic symptoms including periods of decreased need for sleep, increased energy, mood lability, impulsivity, FOI, and excessive spending. The last few nights she has not been sleeping well due to a sinus infection. Most nights she sleeps well with Melatonin 1.5mg . Her energy and appetite are good most days. Crystianna shares she had psychological testing and it pointed towards ADHD. Tyler would like to manage with learning coping skills rather than medications. She focus is generally poor and she is easily distracted. She starts many thing but doesn't complete them.     Observations/Objective: Psychiatric Specialty Exam: ROS  There were no vitals taken for this visit.There is no height or weight on file to calculate BMI.  General Appearance: Fairly Groomed and Neat  Eye Contact:  Good  Speech:  Clear and Coherent and Pressured  Volume:  Normal  Mood:  Euthymic  Affect:  Full Range  Thought Process:  Goal Directed, Linear, and Descriptions of Associations: Intact  Orientation:   Full (Time, Place, and Person)  Thought Content:  Logical  Suicidal Thoughts:  No  Homicidal Thoughts:  No  Memory:  Immediate;   Good  Judgement:  Good  Insight:  Good  Psychomotor Activity:  Normal  Concentration:  Concentration: Good  Recall:  Good  Fund of Knowledge:  Good  Language:  Good  Akathisia:  No  Handed:  Right  AIMS (if indicated):     Assets:  Communication Skills Desire for Improvement Financial Resources/Insurance Housing Intimacy Leisure Time Resilience Social Support Talents/Skills Transportation Vocational/Educational  ADL's:  Intact  Cognition:  WNL  Sleep:        Assessment and Plan:     03/31/2022   10:06 AM 10/14/2021   10:27 AM 07/15/2021    9:07 AM 04/29/2021   10:42 AM 02/04/2021   10:12 AM  Depression screen PHQ 2/9  Decreased Interest 0 0 0 1 0  Down, Depressed, Hopeless 0 0 0 1 0  PHQ - 2 Score 0 0 0 2 0  Altered sleeping    0   Tired, decreased energy    1   Change in appetite    0   Feeling bad or failure about yourself     0   Trouble concentrating    0   Moving slowly or fidgety/restless    0   Suicidal thoughts    0   PHQ-9 Score    3   Difficult doing work/chores    Somewhat difficult     Flowsheet Row Video Visit from 03/31/2022 in [facility] Video Visit from 10/14/2021 in [facility] Video Visit from 07/15/2021 in [facility] No Risk No Risk No Risk          Pt is aware that these meds carry a teratogenic risk. Pt will discuss plan of action if she does or plans to become pregnant in the future.  Status of current problems: stable   Medication management with supportive therapy. Risks and benefits, side effects and alternative treatment options discussed with patient. Pt was given an opportunity to ask questions about medication, illness, and treatment. All current psychiatric  medications have been reviewed and discussed with the patient and adjusted as clinically appropriate.  Pt verbalized understanding and verbal consent obtained for treatment.  Meds:  1. Generalized anxiety disorder - buPROPion (WELLBUTRIN XL) 300 MG 24 hr tablet; Take 1 tablet (300 mg total) by mouth every morning.  Dispense: 90 tablet; Refill: 0  2. Bipolar 1 disorder, depressed (HCC) - buPROPion (WELLBUTRIN XL) 300 MG 24 hr tablet; Take 1 tablet (300 mg total) by mouth every morning.  Dispense: 90 tablet; Refill: 0 - divalproex (DEPAKOTE ER) 500 MG 24 hr tablet; Take 3 tablets (1,500 mg total) by mouth daily.  Dispense: 270 tablet; Refill: 0 - lithium carbonate (LITHOBID) 300 MG ER tablet; Take 2 tablets (600 mg total) by mouth every evening.  Dispense: 180 tablet; Refill: 0  3. Encounter for therapeutic drug level monitoring - Lithium level - Valproic acid level - CBC with Differential - Comprehensive Metabolic Panel (CMET) - TSH       Therapy: brief supportive therapy provided. Discussed psychosocial stressors in detail.      Collaboration of Care: Other none  Patient/Guardian was advised Release of Information must be obtained prior to any record release in order to collaborate their care with an outside provider. Patient/Guardian was advised if they have not already done so to contact the registration department to sign all necessary forms in order for [name] to release information regarding their care.   Consent: Patient/Guardian gives verbal consent for treatment and assignment of benefits for services provided during this visit. Patient/Guardian expressed understanding and agreed to proceed.      Follow Up Instructions: Follow up in 3 months or sooner if needed    I discussed the assessment and treatment plan with the patient. The patient was provided an opportunity to ask questions and all were answered. The patient agreed with the plan and demonstrated an understanding of  the instructions.   The patient was advised to call back or seek an in-person evaluation if the symptoms worsen or if the condition fails to improve as anticipated.  I provided 13 minutes of non-face-to-face time during this encounter.   Charlcie Cradle, MD

## 2022-05-04 ENCOUNTER — Other Ambulatory Visit (HOSPITAL_COMMUNITY): Payer: Self-pay

## 2022-05-10 ENCOUNTER — Other Ambulatory Visit (HOSPITAL_COMMUNITY): Payer: Self-pay

## 2022-05-16 ENCOUNTER — Encounter (HOSPITAL_COMMUNITY): Payer: Self-pay

## 2022-05-17 ENCOUNTER — Ambulatory Visit: Payer: Medicaid Other | Admitting: Neurology

## 2022-05-17 DIAGNOSIS — G43909 Migraine, unspecified, not intractable, without status migrainosus: Secondary | ICD-10-CM

## 2022-05-17 DIAGNOSIS — G43709 Chronic migraine without aura, not intractable, without status migrainosus: Secondary | ICD-10-CM

## 2022-05-17 MED ORDER — KETOROLAC TROMETHAMINE 60 MG/2ML IM SOLN
60.0000 mg | Freq: Once | INTRAMUSCULAR | Status: AC
  Administered 2022-05-17: 60 mg via INTRAMUSCULAR

## 2022-05-17 MED ORDER — ONABOTULINUMTOXINA 100 UNITS IJ SOLR
155.0000 [IU] | Freq: Once | INTRAMUSCULAR | Status: AC
  Administered 2022-05-17: 155 [IU] via INTRAMUSCULAR

## 2022-05-17 MED ORDER — ZAVZPRET 10 MG/ACT NA SOLN: 1.0000 | Freq: Every day | NASAL | 11 refills | Status: DC | PRN

## 2022-05-17 NOTE — Progress Notes (Signed)
Consent Form Botulism Toxin Injection For Chronic Migraine   Meds ordered this encounter  Medications   ketorolac (TORADOL) injection 60 mg   02/22/2022: Stable  12/05/2021: She is doing excellent. On Botox migraines have been reduced to 4 migraine days a month and <10 total headache days a month. Exceleent response.  For migraines that come on quickly will prescribe zavzpret.  09/07/2021: On Botox migraines have been reduced to 4 migraine days a month and <10 total headache days a month. Exceleent response.   06/08/2021: Baseline 15 headache days a month and 10 migraine days a month. Excellent response, she has > 75% reduction in migraines and headaches monthlyPatient felt her forehead and eyebrows were uncomfortable due to inability to move them, reduced injections 1/2 injections in the corrigators and procerus, did the frontalis very high and was much better. 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 She also has temple pain.   Dry needling gave her a very bad migraine will not order that again. Aimovig gave her constipation and we stopped due to insurance. Doing so well doesn't even need to consider aimovig* at this time 03/16/2021: She had to put her dog down at [place] 16. She had a very bad migraine.  When she has a migraine we use Ubrelvy. Try getting her aimovig  09/15/2020: Baseline 15 headache days a month and 10 migraine days a month. Excellent response, she has > 75% reduction in migraines and headaches monthly. Discussed acute management prescribed frova, zembrace works quickly but the migraines rebound. Did great on Nurtec and we prescribed and she continues to take. Patient felt her forehead and eyebrows were uncomfortable due to inability to move them, reduced injections 1/2 injections in the corrigators and procerus, did the frontalis very high and was much better. She is a clencher her masseters and pterygoids hurt  unfortunately try muscle relaxer at bedtime. She also has temple pain.   Dry needling gave her a very bad migraine will not order that again. Aimovig gave her constipation and we stopped due to insurance. Doing so well doesn't even need to consider aimovig* at this time   Reviewed orally with patient, additionally signature is on file:  Botulism toxin has been approved by the Federal drug administration for treatment of chronic migraine. Botulism toxin does not cure chronic migraine and it may not be effective in some patients.  The administration of botulism toxin is accomplished by injecting a small amount of toxin into the muscles of the neck and head. Dosage must be titrated for each individual. Any benefits resulting from botulism toxin tend to wear off after 3 months with a repeat injection required if benefit is to be maintained. Injections are usually done every 3-4 months with maximum effect peak achieved by about 2 or 3 weeks. Botulism toxin is expensive and you should be sure of what costs you will incur resulting from the injection.  The side effects of botulism toxin use for chronic migraine may include:   -Transient, and usually mild, facial weakness with facial injections  -Transient, and usually mild, head or neck weakness with head/neck injections  -Reduction or loss of forehead facial animation due to forehead muscle weakness  -Eyelid drooping  -Dry eye  -Pain at the site of injection or bruising at the site of injection  -Double vision  -Potential unknown long term risks  Contraindications: You should not have Botox if you are pregnant, nursing,  allergic to albumin, have an infection, skin condition, or muscle weakness at the site of the injection, or have myasthenia gravis, Lambert-Eaton syndrome, or ALS.  It is also possible that as with any injection, there may be an allergic reaction or no effect from the medication. Reduced effectiveness after repeated injections is  sometimes seen and rarely infection at the injection site may occur. All care will be taken to prevent these side effects. If therapy is given over a long time, atrophy and wasting in the muscle injected may occur. Occasionally the patient's become refractory to treatment because they develop antibodies to the toxin. In this event, therapy needs to be modified.  I have read the above information and consent to the administration of botulism toxin.    BOTOX PROCEDURE NOTE FOR MIGRAINE HEADACHE    Contraindications and precautions discussed with patient(above). Aseptic procedure was observed and patient tolerated procedure. Procedure performed by Dr. Artemio Aly  The condition has existed for more than 6 months, and pt does not have a diagnosis of ALS, Myasthenia Gravis or Lambert-Eaton Syndrome.  Risks and benefits of injections discussed and pt agrees to proceed with the procedure.  Written consent obtained  These injections are medically necessary. Pt  receives good benefits from these injections. These injections do not cause sedations or hallucinations which the oral therapies may cause.  Indication/Diagnosis: chronic migraine BOTOX(J0585) injection was performed according to protocol by [employer]. 200 units of BOTOX was dissolved into 4 cc NS.   NDC: [address]-78   Description of procedure:  The patient was placed in a sitting position. The standard protocol was used for Botox as follows, with 5 units of Botox injected at each site:   -Procerus muscle, midline injection 2.5 units   -Corrugator muscle, bilateral injection 2.5 units each  -Frontalis muscle, bilateral injection, with 2 sites each side, medial injection was performed in the upper one third of the frontalis muscle, in the region vertical from the medial inferior edge of the superior orbital rim. The lateral injection was again in the upper one third of the forehead vertically above the lateral limbus of the cornea, 1.5 cm  lateral to the medial injection site.  -Temporalis muscle injection, 4 sites, bilaterally. The first injection was 3 cm above the tragus of the ear, second injection site was 1.5 cm to 3 cm up from the first injection site in line with the tragus of the ear. The third injection site was 1.5-3 cm forward between the first 2 injection sites. The fourth injection site was 1.5 cm posterior to the second injection site. .  -Occipitalis muscle injection, 3 sites, bilaterally. The first injection was done one half way between the occipital protuberance and the tip of the mastoid process behind the ear. The second injection site was done lateral and superior to the first, 1 fingerbreadth from the first injection. The third injection site was 1 fingerbreadth superiorly and medially from the first injection site.  -Cervical paraspinal muscle injection, 2 sites, bilateral knee first injection site was 1 cm from the midline of the cervical spine, 3 cm inferior to the lower border of the occipital protuberance. The second injection site was 1.5 cm superiorly and laterally to the first injection site.  -Trapezius muscle injection was performed at 3 sites, bilaterally. The first injection site was in the upper trapezius muscle halfway between the inflection point of the neck, and the acromion. The second injection site was one half way between the acromion and the first  injection site. The third injection was done between the first injection site and the inflection point of the neck.   Will return for repeat injection in 3 months.   155 unit sof Botox was used, 45 Botox was wasted. The patient tolerated the procedure well, there were no complications of the above procedure.

## 2022-05-17 NOTE — Progress Notes (Signed)
Botox- 100 units x 2 vials Lot: [id number] Expiration: 06/2024 NDC: 2130-8657-84  Bacteriostatic 0.9% Sodium Chloride- 2 mL  Lot: [id number] Expiration: 11/25 NDC: 84132-440-10  Dx: U72.536 S/P  Witnessed by S. Margo Aye CMA  Received verbal order from Dr Lucia Gaskins for Toradol 60mg  IM x1.  Under aseptic technique injected Toradol into LUOQ of L buttock.  Bandaid applied. Pt tolerated well.

## 2022-06-02 ENCOUNTER — Telehealth (HOSPITAL_BASED_OUTPATIENT_CLINIC_OR_DEPARTMENT_OTHER): Payer: Medicaid Other | Admitting: Psychiatry

## 2022-06-02 DIAGNOSIS — F411 Generalized anxiety disorder: Secondary | ICD-10-CM | POA: Diagnosis not present

## 2022-06-02 DIAGNOSIS — F319 Bipolar disorder, unspecified: Secondary | ICD-10-CM

## 2022-06-02 LAB — COMPREHENSIVE METABOLIC PANEL
ALT: 6 IU/L (ref 0–32)
AST: 15 IU/L (ref 0–40)
Albumin/Globulin Ratio: 2.2 (ref 1.2–2.2)
Albumin: 4.1 g/dL (ref 3.9–4.9)
Alkaline Phosphatase: 78 IU/L (ref 44–121)
BUN/Creatinine Ratio: 18 (ref 9–23)
BUN: 15 mg/dL (ref 6–24)
Bilirubin Total: 0.2 mg/dL (ref 0.0–1.2)
CO2: 24 mmol/L (ref 20–29)
Calcium: 9.4 mg/dL (ref 8.7–10.2)
Chloride: 103 mmol/L (ref 96–106)
Creatinine, Ser: 0.82 mg/dL (ref 0.57–1.00)
Globulin, Total: 1.9 g/dL (ref 1.5–4.5)
Glucose: 80 mg/dL (ref 70–99)
Potassium: 5.1 mmol/L (ref 3.5–5.2)
Sodium: 138 mmol/L (ref 134–144)
Total Protein: 6 g/dL (ref 6.0–8.5)
eGFR: 88 mL/min/{1.73_m2} (ref >59)

## 2022-06-02 LAB — CBC WITH DIFFERENTIAL/PLATELET
Basophils Absolute: 0 10*3/uL (ref 0.0–0.2)
Basos: 1 %
EOS (ABSOLUTE): 0.1 10*3/uL (ref 0.0–0.4)
Eos: 2 %
Hematocrit: 37.9 % (ref 34.0–46.6)
Hemoglobin: 12.2 g/dL (ref 11.1–15.9)
Immature Grans (Abs): 0 10*3/uL (ref 0.0–0.1)
Immature Granulocytes: 1 %
Lymphocytes Absolute: 1.7 10*3/uL (ref 0.7–3.1)
Lymphs: 28 %
MCH: 30.9 pg (ref 26.6–33.0)
MCHC: 32.2 g/dL (ref 31.5–35.7)
MCV: 96 fL (ref 79–97)
Monocytes Absolute: 0.4 10*3/uL (ref 0.1–0.9)
Monocytes: 6 %
Neutrophils Absolute: 3.9 10*3/uL (ref 1.4–7.0)
Neutrophils: 62 %
Platelets: 265 10*3/uL (ref 150–450)
RBC: 3.95 x10E6/uL (ref 3.77–5.28)
RDW: 12.5 % (ref 11.7–15.4)
WBC: 6.2 10*3/uL (ref 3.4–10.8)

## 2022-06-02 LAB — LITHIUM LEVEL: Lithium Lvl: 1 mmol/L (ref 0.5–1.2)

## 2022-06-02 LAB — TSH: TSH: 4.61 u[IU]/mL — ABNORMAL HIGH (ref 0.450–4.500)

## 2022-06-02 LAB — VALPROIC ACID LEVEL: Valproic Acid Lvl: 91 ug/mL (ref 50–100)

## 2022-06-02 MED ORDER — DIVALPROEX SODIUM ER 500 MG PO TB24: 1500.0000 mg | ORAL_TABLET | Freq: Every day | ORAL | 1 refills | Status: DC

## 2022-06-02 MED ORDER — LITHIUM CARBONATE ER 300 MG PO TBCR: 600.0000 mg | EXTENDED_RELEASE_TABLET | Freq: Every evening | ORAL | 1 refills | Status: DC

## 2022-06-02 MED ORDER — BUPROPION HCL ER (XL) 300 MG PO TB24: 300.0000 mg | ORAL_TABLET | ORAL | 1 refills | Status: DC

## 2022-06-02 MED ORDER — ALPRAZOLAM 0.5 MG PO TABS: 0.5000 mg | ORAL_TABLET | Freq: Two times a day (BID) | ORAL | 5 refills | Status: DC | PRN

## 2022-06-02 NOTE — Progress Notes (Signed)
Virtual Visit via Video Note  I connected with Margaret Lopez on 06/02/22 at 10:30 AM EDT by a video enabled telemedicine application and verified that I am speaking with the correct person using two identifiers.  Location: Patient: home Provider: office   I discussed the limitations of evaluation and management by telemedicine and the availability of in person appointments. The patient expressed understanding and agreed to proceed.  History of Present Illness: "I can't believe you are leaving". Jailee shares "I think I am doing ok". She has had a few issues with her children lately. Her anxiety and sadness are justified given all that she is going thru. Colette tries to keep busy and distracted. Her anxiety manifests as tightness in her chest, restlessness, decreased energy and some racing thoughts. She can't turn off the anxiety or forget about it. The depression is not severe but is mostly situational sadness. She denies anhedonia and remains active. Her sleep is a little better with Melatonin. Her energy is good.  Her appetite is good but nothing tastes right due to nasal congestion. Caniyah denies SI/HI. Pt denies recent manic and hypomanic symptoms including periods of decreased need for sleep, increased energy, mood lability, impulsivity, FOI, and excessive spending. Her medications are working well and denies SE.       Observations/Objective: Psychiatric Specialty Exam: ROS  There were no vitals taken for this visit.There is no height or weight on file to calculate BMI.  General Appearance: Fairly Groomed and Neat  Eye Contact:  Good  Speech:  Clear and Coherent and Normal Rate  Volume:  Normal  Mood:  Anxious  Affect:  Constricted  Thought Process:  Goal Directed, Linear, and Descriptions of Associations: Intact  Orientation:  Full (Time, Place, and Person)  Thought Content:  Logical  Suicidal Thoughts:  No  Homicidal Thoughts:  No  Memory:  Immediate;   Good  Judgement:  Good   Insight:  Good  Psychomotor Activity:  Normal  Concentration:  Concentration: Good  Recall:  Good  Fund of Knowledge:  Good  Language:  Good  Akathisia:  No  Handed:  Right  AIMS (if indicated):     Assets:  Communication Skills Desire for Improvement Financial Resources/Insurance Housing Intimacy Resilience Social Support Talents/Skills Transportation Vocational/Educational  ADL's:  Intact  Cognition:  WNL  Sleep:        Assessment and Plan:     03/31/2022   10:06 AM 10/14/2021   10:27 AM 07/15/2021    9:07 AM 04/29/2021   10:42 AM 02/04/2021   10:12 AM  Depression screen PHQ 2/9  Decreased Interest 0 0 0 1 0  Down, Depressed, Hopeless 0 0 0 1 0  PHQ - 2 Score 0 0 0 2 0  Altered sleeping    0   Tired, decreased energy    1   Change in appetite    0   Feeling bad or failure about yourself     0   Trouble concentrating    0   Moving slowly or fidgety/restless    0   Suicidal thoughts    0   PHQ-9 Score    3   Difficult doing work/chores    Somewhat difficult     Flowsheet Row Video Visit from 03/31/2022 in [facility] Video Visit from 10/14/2021 in [facility] Video Visit from 07/15/2021 in [facility]  C-SSRS RISK CATEGORY No Risk No Risk No Risk  Pt is aware that these meds carry a teratogenic risk. Pt will discuss plan of action if she does or plans to become pregnant in the future.  Status of current problems: some situational sadness and anxiety but it is manageable.   Medication management with supportive therapy. Risks and benefits, side effects and alternative treatment options discussed with patient. Pt was given an opportunity to ask questions about medication, illness, and treatment. All current psychiatric medications have been reviewed and discussed with the patient and adjusted as clinically appropriate.  Pt verbalized  understanding and verbal consent obtained for treatment.  Meds: Dosha wants to continue Lithium despite increased TSH. She will follow up with her PCP 1. Generalized anxiety disorder - ALPRAZolam (XANAX) 0.5 MG tablet; Take 1 tablet (0.5 mg total) by mouth 2 (two) times daily as needed for anxiety.  Dispense: 60 tablet; Refill: 5 - buPROPion (WELLBUTRIN XL) 300 MG 24 hr tablet; Take 1 tablet (300 mg total) by mouth every morning.  Dispense: 90 tablet; Refill: 1  2. Bipolar 1 disorder, depressed (HCC) - ALPRAZolam (XANAX) 0.5 MG tablet; Take 1 tablet (0.5 mg total) by mouth 2 (two) times daily as needed for anxiety.  Dispense: 60 tablet; Refill: 5 - buPROPion (WELLBUTRIN XL) 300 MG 24 hr tablet; Take 1 tablet (300 mg total) by mouth every morning.  Dispense: 90 tablet; Refill: 1 - divalproex (DEPAKOTE ER) 500 MG 24 hr tablet; Take 3 tablets (1,500 mg total) by mouth daily.  Dispense: 270 tablet; Refill: 1 - lithium carbonate (LITHOBID) 300 MG ER tablet; Take 2 tablets (600 mg total) by mouth every evening.  Dispense: 180 tablet; Refill: 1     Labs: reviewed labs 05/31/22 CBC WNL, CMP WNL, Lithium 1, Depakote level 91,  TSH is elevated- I recommended she follow up her PCP. I encouraged her to drink water due to Lithium level is 1.     Therapy: brief supportive therapy provided. Discussed psychosocial stressors in detail.      Collaboration of Care: Other follow up with PCP regarding TSH  Patient/Guardian was advised Release of Information must be obtained prior to any record release in order to collaborate their care with an outside provider. Patient/Guardian was advised if they have not already done so to contact the registration department to sign all necessary forms in order for [name] to release information regarding their care.   Consent: Patient/Guardian gives verbal consent for treatment and assignment of benefits for services provided during this visit. Patient/Guardian expressed  understanding and agreed to proceed.      Follow Up Instructions: Follow up in 5-6 months or sooner if needed with a new psychiatrist  Patient informed that I am leaving [facility] in 07/2022 and I relayed that they will be getting a new provider after that. Patient verbalized understanding and agreed with the plan.     I discussed the assessment and treatment plan with the patient. The patient was provided an opportunity to ask questions and all were answered. The patient agreed with the plan and demonstrated an understanding of the instructions.   The patient was advised to call back or seek an in-person evaluation if the symptoms worsen or if the condition fails to improve as anticipated.  I provided 17 minutes of non-face-to-face time during this encounter.   Oletta Darter, MD

## 2022-06-07 ENCOUNTER — Telehealth: Payer: Self-pay

## 2022-06-07 ENCOUNTER — Other Ambulatory Visit (HOSPITAL_COMMUNITY): Payer: Self-pay

## 2022-06-07 NOTE — Telephone Encounter (Signed)
Pharmacy Patient Advocate Encounter   Received notification from [facility] that prior authorization for Zavzpret 10MG /ACT solution is required/requested.   PA submitted on 06/07/2022 to (ins) [employer] [facility]  via [employer] or ([place]) confirmation # [id number] Status is pending

## 2022-06-08 ENCOUNTER — Other Ambulatory Visit (HOSPITAL_COMMUNITY): Payer: Self-pay

## 2022-06-08 NOTE — Telephone Encounter (Signed)
Pharmacy Patient Advocate Encounter  Prior Authorization for Zavzpret 10MG /ACT solution has been approved by [employer]  (ins).    PA # PA Case ID: [id number]  Effective dates: 06/07/2022 through 06/07/2023  Copay is $4.00 per [facility] test claim.

## 2022-07-05 ENCOUNTER — Other Ambulatory Visit: Payer: Self-pay | Admitting: Neurology

## 2022-07-06 ENCOUNTER — Encounter: Payer: Self-pay | Admitting: Neurology

## 2022-07-23 ENCOUNTER — Other Ambulatory Visit: Payer: Self-pay | Admitting: Neurology

## 2022-07-23 DIAGNOSIS — G43709 Chronic migraine without aura, not intractable, without status migrainosus: Secondary | ICD-10-CM

## 2022-07-28 ENCOUNTER — Other Ambulatory Visit (HOSPITAL_COMMUNITY): Payer: Self-pay

## 2022-08-08 ENCOUNTER — Other Ambulatory Visit: Payer: Self-pay

## 2022-08-09 ENCOUNTER — Other Ambulatory Visit: Payer: Self-pay

## 2022-08-14 ENCOUNTER — Encounter: Payer: Self-pay | Admitting: Neurology

## 2022-08-14 ENCOUNTER — Other Ambulatory Visit: Payer: Self-pay | Admitting: Neurology

## 2022-08-14 DIAGNOSIS — G43709 Chronic migraine without aura, not intractable, without status migrainosus: Secondary | ICD-10-CM

## 2022-08-15 ENCOUNTER — Other Ambulatory Visit: Payer: Self-pay | Admitting: *Deleted

## 2022-08-15 NOTE — Progress Notes (Signed)
error 

## 2022-08-15 NOTE — Telephone Encounter (Signed)
Sent Rx request to Dr Lucia Gaskins.

## 2022-08-18 ENCOUNTER — Ambulatory Visit: Payer: Medicaid Other | Admitting: Neurology

## 2022-08-18 DIAGNOSIS — G43709 Chronic migraine without aura, not intractable, without status migrainosus: Secondary | ICD-10-CM | POA: Diagnosis not present

## 2022-08-18 NOTE — Progress Notes (Unsigned)
Botox- 100 units x 2 vial Lot: [id number] Expiration: 08/2024/10/2024 NDC: 2595-6387-56  X2   Bacteriostatic 0.9% Sodium Chloride- 4mL total [id number] Expiration: 04/11/23 NDC: 4166-0630-16  Dx: W10.932 SP  Witnessed by: Alveria Apley

## 2022-08-18 NOTE — Progress Notes (Unsigned)
Consent Form Botulism Toxin Injection For Chronic Migraine   No orders of the defined types were placed in this encounter. 08/18/2022:stable 02/22/2022: Stable  12/05/2021: She is doing excellent. On Botox migraines have been reduced to 4 migraine days a month and <10 total headache days a month. Exceleent response.  For migraines that come on quickly will prescribe zavzpret.  09/07/2021: On Botox migraines have been reduced to 4 migraine days a month and <10 total headache days a month. Exceleent response.   06/08/2021: Baseline 15 headache days a month and 10 migraine days a month. Excellent response, she has > 75% reduction in migraines and headaches monthlyPatient felt her forehead and eyebrows were uncomfortable due to inability to move them, reduced injections 1/2 injections in the corrigators and procerus, did the frontalis very high and was much better. 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 She also has temple pain.   Dry needling gave her a very bad migraine will not order that again. Aimovig gave her constipation and we stopped due to insurance. Doing so well doesn't even need to consider aimovig* at this time 03/16/2021: She had to put her dog down at [place] Genevie Cheshire was 16. She had a very bad migraine.  When she has a migraine we use Ubrelvy. Try getting her aimovig  09/15/2020: Baseline 15 headache days a month and 10 migraine days a month. Excellent response, she has > 75% reduction in migraines and headaches monthly. Discussed acute management prescribed frova, zembrace works quickly but the migraines rebound. Did great on Nurtec and we prescribed and she continues to take. Patient felt her forehead and eyebrows were uncomfortable due to inability to move them, reduced injections 1/2 injections in the corrigators and procerus, did the frontalis very high and was much better. She is a clencher her masseters and pterygoids hurt  unfortunately try muscle relaxer at bedtime. She also has temple pain.   Dry needling gave her a very bad migraine will not order that again. Aimovig gave her constipation and we stopped due to insurance. Doing so well doesn't even need to consider aimovig* at this time   Reviewed orally with patient, additionally signature is on file:  Botulism toxin has been approved by the Federal drug administration for treatment of chronic migraine. Botulism toxin does not cure chronic migraine and it may not be effective in some patients.  The administration of botulism toxin is accomplished by injecting a small amount of toxin into the muscles of the neck and head. Dosage must be titrated for each individual. Any benefits resulting from botulism toxin tend to wear off after 3 months with a repeat injection required if benefit is to be maintained. Injections are usually done every 3-4 months with maximum effect peak achieved by about 2 or 3 weeks. Botulism toxin is expensive and you should be sure of what costs you will incur resulting from the injection.  The side effects of botulism toxin use for chronic migraine may include:   -Transient, and usually mild, facial weakness with facial injections  -Transient, and usually mild, head or neck weakness with head/neck injections  -Reduction or loss of forehead facial animation due to forehead muscle weakness  -Eyelid drooping  -Dry eye  -Pain at the site of injection or bruising at the site of injection  -Double vision  -Potential unknown long term risks  Contraindications: You should not have Botox if you are pregnant, nursing, allergic to albumin,  have an infection, skin condition, or muscle weakness at the site of the injection, or have myasthenia gravis, Lambert-Eaton syndrome, or ALS.  It is also possible that as with any injection, there may be an allergic reaction or no effect from the medication. Reduced effectiveness after repeated injections is  sometimes seen and rarely infection at the injection site may occur. All care will be taken to prevent these side effects. If therapy is given over a long time, atrophy and wasting in the muscle injected may occur. Occasionally the patient's become refractory to treatment because they develop antibodies to the toxin. In this event, therapy needs to be modified.  I have read the above information and consent to the administration of botulism toxin.    BOTOX PROCEDURE NOTE FOR MIGRAINE HEADACHE    Contraindications and precautions discussed with patient(above). Aseptic procedure was observed and patient tolerated procedure. Procedure performed by Dr. Artemio Aly  The condition has existed for more than 6 months, and pt does not have a diagnosis of ALS, Myasthenia Gravis or Lambert-Eaton Syndrome.  Risks and benefits of injections discussed and pt agrees to proceed with the procedure.  Written consent obtained  These injections are medically necessary. Pt  receives good benefits from these injections. These injections do not cause sedations or hallucinations which the oral therapies may cause.  Indication/Diagnosis: chronic migraine BOTOX(J0585) injection was performed according to protocol by [employer]. 200 units of BOTOX was dissolved into 4 cc NS.   NDC: [address]-21   Description of procedure:  The patient was placed in a sitting position. The standard protocol was used for Botox as follows, with 5 units of Botox injected at each site:   -Procerus muscle, midline injection 2.5 units   -Corrugator muscle, bilateral injection 2.5 units each  -Frontalis muscle, bilateral injection, with 2 sites each side, medial injection was performed in the upper one third of the frontalis muscle, in the region vertical from the medial inferior edge of the superior orbital rim. The lateral injection was again in the upper one third of the forehead vertically above the lateral limbus of the cornea, 1.5 cm  lateral to the medial injection site.  -Temporalis muscle injection, 4 sites, bilaterally. The first injection was 3 cm above the tragus of the ear, second injection site was 1.5 cm to 3 cm up from the first injection site in line with the tragus of the ear. The third injection site was 1.5-3 cm forward between the first 2 injection sites. The fourth injection site was 1.5 cm posterior to the second injection site. .  -Occipitalis muscle injection, 3 sites, bilaterally. The first injection was done one half way between the occipital protuberance and the tip of the mastoid process behind the ear. The second injection site was done lateral and superior to the first, 1 fingerbreadth from the first injection. The third injection site was 1 fingerbreadth superiorly and medially from the first injection site.  -Cervical paraspinal muscle injection, 2 sites, bilateral knee first injection site was 1 cm from the midline of the cervical spine, 3 cm inferior to the lower border of the occipital protuberance. The second injection site was 1.5 cm superiorly and laterally to the first injection site.  -Trapezius muscle injection was performed at 3 sites, bilaterally. The first injection site was in the upper trapezius muscle halfway between the inflection point of the neck, and the acromion. The second injection site was one half way between the acromion and the first injection site. The  third injection was done between the first injection site and the inflection point of the neck.   Will return for repeat injection in 3 months.   155 unit sof Botox was used, 45 Botox was wasted. The patient tolerated the procedure well, there were no complications of the above procedure.

## 2022-08-22 ENCOUNTER — Ambulatory Visit (HOSPITAL_COMMUNITY): Payer: Medicaid Other | Admitting: Student

## 2022-08-22 MED ORDER — ONABOTULINUMTOXINA 100 UNITS IJ SOLR
100.0000 [IU] | Freq: Once | INTRAMUSCULAR | Status: DC
Start: 2022-08-22 — End: 2023-10-25

## 2022-08-22 MED ORDER — ONABOTULINUMTOXINA 100 UNITS IJ SOLR
155.0000 [IU] | Freq: Once | INTRAMUSCULAR | Status: AC
Start: 2022-08-22 — End: ?
  Administered 2022-08-18: 155 [IU] via INTRAMUSCULAR

## 2022-09-19 ENCOUNTER — Ambulatory Visit (HOSPITAL_COMMUNITY): Payer: Medicaid Other | Admitting: Student

## 2022-09-26 ENCOUNTER — Ambulatory Visit (HOSPITAL_COMMUNITY): Payer: Medicaid Other | Admitting: Student

## 2022-10-05 ENCOUNTER — Encounter: Payer: Self-pay | Admitting: Family Medicine

## 2022-10-05 ENCOUNTER — Ambulatory Visit (HOSPITAL_COMMUNITY): Payer: Medicaid Other | Admitting: Student

## 2022-10-05 ENCOUNTER — Other Ambulatory Visit: Payer: Self-pay | Admitting: Neurology

## 2022-10-05 DIAGNOSIS — G43709 Chronic migraine without aura, not intractable, without status migrainosus: Secondary | ICD-10-CM

## 2022-10-10 ENCOUNTER — Ambulatory Visit (HOSPITAL_COMMUNITY): Payer: Medicaid Other | Admitting: Student

## 2022-10-13 ENCOUNTER — Encounter: Payer: Self-pay | Admitting: Family Medicine

## 2022-10-17 ENCOUNTER — Ambulatory Visit (HOSPITAL_BASED_OUTPATIENT_CLINIC_OR_DEPARTMENT_OTHER): Payer: Medicaid Other | Admitting: Family

## 2022-10-17 ENCOUNTER — Encounter (HOSPITAL_COMMUNITY): Payer: Self-pay | Admitting: Family

## 2022-10-17 ENCOUNTER — Other Ambulatory Visit: Payer: Self-pay

## 2022-10-17 ENCOUNTER — Ambulatory Visit (HOSPITAL_COMMUNITY): Payer: Medicaid Other | Admitting: Student

## 2022-10-17 VITALS — BP 116/76 | HR 73 | Ht 63.0 in | Wt 168.0 lb

## 2022-10-17 DIAGNOSIS — F319 Bipolar disorder, unspecified: Secondary | ICD-10-CM | POA: Diagnosis not present

## 2022-10-17 DIAGNOSIS — F411 Generalized anxiety disorder: Secondary | ICD-10-CM

## 2022-10-17 MED ORDER — ATOMOXETINE HCL 18 MG PO CAPS: 18.0000 mg | ORAL_CAPSULE | Freq: Every day | ORAL | 0 refills | Status: DC
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
## 2022-11-02 ENCOUNTER — Telehealth (HOSPITAL_COMMUNITY): Payer: Medicaid Other | Admitting: Family

## 2022-11-02 DIAGNOSIS — F3130 Bipolar disorder, current episode depressed, mild or moderate severity, unspecified: Secondary | ICD-10-CM | POA: Diagnosis not present

## 2022-11-02 DIAGNOSIS — F319 Bipolar disorder, unspecified: Secondary | ICD-10-CM

## 2022-11-02 DIAGNOSIS — F411 Generalized anxiety disorder: Secondary | ICD-10-CM

## 2022-11-02 NOTE — Progress Notes (Signed)
Virtual Visit via Video Note  I connected with Margaret Lopez on 11/02/22 at  9:30 AM EDT by a video enabled telemedicine application and verified that I am speaking with the correct person using two identifiers.  Location: Patient: Home Provider:  Office   I discussed the limitations of evaluation and management by telemedicine and the availability of in person appointments. The patient expressed understanding and agreed to proceed.   I discussed the assessment and treatment plan with the patient. The patient was provided an opportunity to ask questions and all were answered. The patient agreed with the plan and demonstrated an understanding of the instructions.   The patient was advised to call back or seek an in-person evaluation if the symptoms worsen or if the condition fails to improve as anticipated.  I provided 00 minutes of non-face-to-face time during this encounter.   [name], NP   [facility] MD/PA/NP OP Progress Note  11/02/2022 9:35 AM Margaret Lopez  MRN:  [id number]  Chief Complaint: Margaret Lopez stated " I have been extremely tired since starting Strattera last week."  HPI: Margaret Lopez 52 year old Caucasian female was seen and evaluated via video tele-assessment.  She presents to follow-up with medication management appointment.  She carries a diagnosis of bipolar disorder, generalized anxiety disorder and major depressive disorder.    Currently she is prescribed Lamictal 600 mg daily and Depakote 1500 mg daily.  She was initiated on Strattera 18 mg for reported issues related to concentration and focus.  She reports she has followed up with adult ADHD testing.  States that she does not feel that the Wilhemena Durie is helping with her symptoms.  Patient to follow-up with test results, with consideration for controlled medications for attention and focus.    Margaret Lopez is sitting upright. she is alert/oriented x 3; calm/cooperative; and mood congruent with affect.  Patient is  speaking in a clear tone at moderate volume, and normal pace; with good eye contact.Her thought process is coherent and relevant; There is no indication that she is currently responding to internal/external stimuli or experiencing delusional thought content.    Patient denies suicidal/self-harm/homicidal ideation, psychosis, and paranoia.  Patient has remained calm throughout assessment and has answered questions appropriately.    Visit Diagnosis:    ICD-10-CM   1. Bipolar 1 disorder, depressed (HCC)  F31.9     2. GAD (generalized anxiety disorder)  F41.1       Past Psychiatric History:   Past Medical History:  Past Medical History:  Diagnosis Date   Anxiety    Asthma    Bipolar disorder (HCC)    Depression    Deviated nasal septum 02/2011   Generalized anxiety disorder 04/16/2013   GERD (gastroesophageal reflux disease)    Headache(784.0)    migraines   Insomnia    Nasal turbinate hypertrophy 02/2011   bilat.   Neuromuscular disorder (HCC)    Dr. Judie Petit. [facility] Burly- does injections around nerve in her head for prevention of migraines - q 3 months    Pneumonia    hx of    PONV (postoperative nausea and vomiting)    Seasonal allergies     Past Surgical History:  Procedure Laterality Date   ABDOMINAL HYSTERECTOMY     CARPAL TUNNEL RELEASE Right    DIAGNOSTIC LAPAROSCOPY     EXCISION MASS LOWER EXTREMETIES Left 06/21/2018   Procedure: Excision of left upper leg fat necrosis;  Surgeon: Peggye Form, DO;  Location: [facility];  Service: Government social research officer;  Laterality: Left;   FOOT SURGERY Right    bone removed from great toe    GASTRIC ROUX-EN-Y N/A 05/16/2016   Procedure: LAPAROSCOPIC ROUX-EN-Y GASTRIC BYPASS WITH UPPER ENDOSCOPY;  Surgeon: Berna Bue, MD;  Location: [facility];  Service: General;  Laterality: N/A;   LAPAROSCOPIC VAGINAL HYSTERECTOMY  03/14/2007   LIPOMA EXCISION Left 08/26/2016   Procedure: EXCISION LIPOMA FROM LEFT UPPER POSTERIOR THIGH;  Surgeon:  Berna Bue, MD;  Location: [facility];  Service: General;  Laterality: Left;   MYRINGOTOMY WITH TUBE PLACEMENT Bilateral 03/02/2015   Procedure: MYRINGOTOMY WITH T-TUBE PLACEMENT;  Surgeon: Newman Pies, MD;  Location: [facility];  Service: ENT;  Laterality: Bilateral;   NASAL SEPTOPLASTY W/ TURBINOPLASTY  02/22/2011   Procedure: NASAL SEPTOPLASTY WITH TURBINATE REDUCTION;  Surgeon: Darletta Moll, MD;  Location: [facility];  Service: ENT;  Laterality: Bilateral;   TUBAL LIGATION  12/16/2004   TYMPANOSTOMY TUBE PLACEMENT Bilateral 07/21/2017    Family Psychiatric History:   Family History:  Family History  Problem Relation Age of Onset   ADD / ADHD Sister    Hypertension Mother    Sarcoidosis Mother    Rheum arthritis Mother    Hypertension Father    Heart Problems Father    Alcohol abuse Maternal Grandmother    Bipolar disorder Maternal Grandmother    Breast cancer Maternal Grandmother    Breast cancer Maternal Aunt    Heart Problems Other        dad's side of family   Suicidality Neg Hx     Social History:  Social History   Socioeconomic History   Marital status: Legally Separated    Spouse name: Not on file   Number of children: 2   Years of education: Not on file   Highest education level: Some college, no degree  Occupational History   Not on file  Tobacco Use   Smoking status: Former    Current packs/day: 0.00    Average packs/day: 0.5 packs/day for 4.0 years (2.0 ttl pk-yrs)    Types: Cigarettes    Start date: 11/12/2011    Quit date: 11/12/2015    Years since quitting: 6.9   Smokeless tobacco: Never  Vaping Use   Vaping status: Never Used  Substance and Sexual Activity   Alcohol use: No    Alcohol/week: 0.0 standard drinks of alcohol    Comment: once a month or less   Drug use: No   Sexual activity: Yes    Partners: Male    Birth control/protection: Surgical    Comment: Hysterectomy  Other Topics Concern   Not on file  Social  History Narrative   Lives at home with fiance', his son, and her children come and go from the home (81 y.o. and 50 y.o.)   Right handed   Caffeine: 2 cups daily   Social Determinants of Health   Financial Resource Strain: Not on file  Food Insecurity: Not on file  Transportation Needs: Not on file  Physical Activity: Sufficiently Active (11/17/2016)   Exercise Vital Sign    Days of Exercise per Week: 5 days    Minutes of Exercise per Session: 40 min  Stress: No Stress Concern Present (11/17/2016)   [facility] [employer] - Occupational Stress Questionnaire    Feeling of Stress : Only a little  Social Connections: Not on file    Allergies:  Allergies  Allergen Reactions   Doxazosin Nausea  And Vomiting   Morphine And Codeine Itching   Phenergan [Promethazine Hcl]     " knocks out for several hours"   Aloe Hives   Penicillins Rash    Has patient had a PCN reaction causing immediate rash, facial/tongue/throat swelling, SOB or lightheadedness with hypotension: No Has patient had a PCN reaction causing severe rash involving mucus membranes or skin necrosis: No Has patient had a PCN reaction that required hospitalization No Has patient had a PCN reaction occurring within the last 10 years: No If all of the above answers are "NO", then may proceed with Cephalosporin use.    Sulfa Antibiotics Rash    Metabolic Disorder Labs: No results found for: "HGBA1C", "MPG" No results found for: "PROLACTIN" No results found for: "CHOL", "TRIG", "HDL", "CHOLHDL", "VLDL", "LDLCALC" Lab Results  Component Value Date   TSH 4.610 (H) 05/31/2022   TSH 4.690 (H) 11/04/2020    Therapeutic Level Labs: Lab Results  Component Value Date   LITHIUM 1.0 05/31/2022   LITHIUM 0.8 11/04/2020   Lab Results  Component Value Date   VALPROATE 91 05/31/2022   VALPROATE 30 (L) 11/04/2020   No results found for: "CBMZ"  Current Medications: Current Outpatient Medications   Medication Sig Dispense Refill   albuterol (PROVENTIL HFA;VENTOLIN HFA) 108 (90 Lopez) MCG/ACT inhaler Inhale 1-2 puffs into the lungs every 6 (six) hours as needed for wheezing or shortness of breath.     ALPRAZolam (XANAX) 0.5 MG tablet Take 1 tablet (0.5 mg total) by mouth 2 (two) times daily as needed for anxiety. 60 tablet 5   atomoxetine (STRATTERA) 18 MG capsule Take 1 capsule (18 mg total) by mouth daily. 30 capsule 0   botulinum toxin Type A (BOTOX) 100 units SOLR injection Provider to inject 155 units into the muscles of the head and neck every 3 months. Discard remainder. 1 each 3   botulinum toxin Type A (BOTOX) 100 units SOLR injection Inject 155 units into head and neck muscles, discard remainder. 2 each 2   cyclobenzaprine (FLEXERIL) 10 MG tablet TAKE 1/2 TO 1 TABLET(5 TO 10 MG) BY MOUTH AT BEDTIME AS NEEDED 90 tablet 4   divalproex (DEPAKOTE ER) 500 MG 24 hr tablet Take 3 tablets (1,500 mg total) by mouth daily. 270 tablet 1   EPINEPHrine 0.3 mg/0.3 mL IJ SOAJ injection Inject 0.3 mg into the muscle daily as needed (for anaphylatic reactions.).  1   Erenumab-aooe (AIMOVIG) 140 MG/ML SOAJ Inject 140 mg into the skin every 30 (thirty) days. 1.12 mL 11   gabapentin (NEURONTIN) 100 MG capsule Take 1 capsule (100 mg total) by mouth 3 (three) times daily as needed. 90 capsule 6   hydrOXYzine (ATARAX/VISTARIL) 25 MG tablet Take 25 mg by mouth at bedtime as needed for itching.  3   ipratropium (ATROVENT) 0.06 % nasal spray Place 2 sprays into both nostrils daily as needed. Allergies, runny nose.  4   ketorolac (TORADOL) 60 MG/2ML SOLN injection INJECT 1-2ML (30-60MG ) INTRAMUSCULARLY AT ONSET OF MIGRAINE. MAY REPEAT IN 6 HOURS. MAX TWICE A DAY AND 4 DAYS PER MONTH. 10 mL 4   levocetirizine (XYZAL) 5 MG tablet Take 5 mg by mouth at bedtime.      lithium carbonate (LITHOBID) 300 MG ER tablet Take 2 tablets (600 mg total) by mouth every evening. 180 tablet 1   methylPREDNISolone (MEDROL DOSEPAK)  4 MG TBPK tablet Take pills daily all together with food. Take the first dose (6 pills) as soon as  possible. Take the rest each morning. For 6 days total 6-5-4-3-2-1. 21 tablet 1   methylPREDNISolone (MEDROL DOSEPAK) 4 MG TBPK tablet TAKE AS DIRECTED WITH FOOD 21 each 0   Multiple Vitamins-Minerals (OPURITY BYPASS OPTIMIZED) CHEW Chew 1 tablet by mouth daily.     naratriptan (AMERGE) 2.5 MG tablet TAKE 1 TABLET BY MOUTH FOR HEADACHE OR MIGRAINE. DO NOT EXCEED 2 DOSES OF ANY TRIPTAN IN 1 DAY 10 tablet 6   ondansetron (ZOFRAN-ODT) 4 MG disintegrating tablet Take 1 tablet (4 mg total) by mouth every 6 (six) hours as needed for nausea or vomiting. 20 tablet 11   prochlorperazine (COMPAZINE) 10 MG tablet Take 1 tablet (10 mg total) by mouth 3 (three) times daily as needed for nausea or vomiting (migraine). MAY CAUSE SEDATION. 30 tablet 3   Rimegepant Sulfate (NURTEC) 75 MG TBDP Take 75 mg by mouth daily as needed. For migraines. Take as close to onset of migraine as possible. One daily maximum. 16 tablet 11   SUMAtriptan (TOSYMRA) 10 MG/ACT SOLN Place 10 mg into the nose every hour as needed. 2 each 0   SUMAtriptan Succinate (ZEMBRACE SYMTOUCH) 3 MG/0.5ML SOAJ Inject 3 mg into the skin every 1 hour as needed for migraine. Max 4 injections in one day. 12 mL 11   SYRINGE-NEEDLE, DISP, 3 ML (BD INTEGRA SYRINGE) 25G X 1" 3 ML MISC ATTACH NEEDLE TO SYRINGE AND USE TO DRAW UP AND ADMINISTER TORADOL. DO NOT REUSE. 4 each 0   valACYclovir (VALTREX) 500 MG tablet Take 500 mg by mouth See admin instructions. Takes 500mg  daily for 5 days when she has a flare up.  3   Zavegepant HCl (ZAVZPRET) 10 MG/ACT SOLN Place 1 spray into the nose daily as needed. PATIENT HAS COPAY CARD WILL BRING 6 each 11   Current Facility-Administered Medications  Medication Dose Route Frequency Provider Last Rate Last Admin   botulinum toxin Type A (BOTOX) injection 100 Units  100 Units Intramuscular Once Anson Fret, MD          Musculoskeletal:   Psychiatric Specialty Exam: Review of Systems  Psychiatric/Behavioral:  Positive for decreased concentration. Negative for agitation and sleep disturbance. The patient is not nervous/anxious.   All other systems reviewed and are negative.   There were no vitals taken for this visit.There is no height or weight on file to calculate BMI.  General Appearance: Casual  Eye Contact:  Good  Speech:  Clear and Coherent  Volume:  Normal  Mood:  Euthymic  Affect:  Congruent  Thought Process:  Coherent  Orientation:  Full (Time, Place, and Person)  Thought Content: Logical   Suicidal Thoughts:  No  Homicidal Thoughts:  No  Memory:  Immediate;   Good Recent;   Good  Judgement:  Good  Insight:  Good  Psychomotor Activity:  Normal  Concentration:  Concentration: Good  Recall:  Good  Fund of Knowledge: Good  Language: Good  Akathisia:  No  Handed:  Right  AIMS (if indicated): not done  Assets:  Communication Skills Desire for Improvement Social Support Talents/Skills  ADL's:  Intact  Cognition: WNL  Sleep:  Fair   Screenings: PHQ2-9    Flowsheet Row Office Visit from 10/17/2022 in [facility] Video Visit from 03/31/2022 in [facility] Video Visit from 10/14/2021 in [facility] Video Visit from 07/15/2021 in [facility] Video Visit from 04/29/2021 in [facility]  PHQ-2 Total Score 3 0 0 0 2  PHQ-9 Total Score 18 -- -- -- 3      Flowsheet Row Video Visit from 03/31/2022 in [facility] Video Visit from 10/14/2021 in [facility] Video Visit from 07/15/2021 in [facility]  C-SSRS RISK CATEGORY No Risk No Risk No Risk        Assessment and Plan: Vedha Cata 52 year old Caucasian female presents via video assessment for medication management appointment.  Currently she is prescribed Depakote 1500 mg daily, lithium 600 daily and reports taking Xanax 0.5 mg as needed.  Patient was initiated on Strattera 18 mg a day for reported symptoms related to concentration and focus.  She denied that this medication has been helping with her symptoms.  Discussed titrating to a higher dose however patient reports that she has been tested for adult attention deficit disorder.  Patient to follow-up with results in office we will discuss medications options after report has been reviewed.  Collaboration of Care: Collaboration of Care: Medication Management AEB continue lithium and Depakote as directed.  Awaiting test results for adult attention deficit disorder testing  -Declined increase in Strattera 18 mg at this time.   Patient/Guardian was advised Release of Information must be obtained prior to any record release in order to collaborate their care with an outside provider. Patient/Guardian was advised if they have not already done so to contact the registration department to sign all necessary forms in order for [name] to release information regarding their care.   Consent: Patient/Guardian gives verbal consent for treatment and assignment of benefits for services provided during this visit. Patient/Guardian expressed understanding and agreed to proceed.    [name], NP 11/02/2022, 9:35 AM

## 2022-11-03 ENCOUNTER — Other Ambulatory Visit: Payer: Self-pay | Admitting: Neurology

## 2022-11-03 ENCOUNTER — Other Ambulatory Visit (HOSPITAL_COMMUNITY): Payer: Self-pay

## 2022-11-03 ENCOUNTER — Other Ambulatory Visit: Payer: Self-pay

## 2022-11-03 MED ORDER — BOTOX 100 UNITS IJ SOLR
INTRAMUSCULAR | 2 refills | Status: DC
  Filled 2022-11-03: qty 2, 84d supply, fill #0
  Filled 2023-02-09: qty 2, 84d supply, fill #1
  Filled 2023-04-24: qty 2, 84d supply, fill #2

## 2022-11-03 NOTE — Telephone Encounter (Signed)
[facility] needs a refill sent in.

## 2022-11-03 NOTE — Progress Notes (Signed)
Specialty Pharmacy Refill Coordination Note  Margaret Lopez is a 52 y.o. female contacted today regarding refills of specialty medication(s) Onabotulinumtoxina   Patient requested Courier to Provider Office   Delivery date: 11/10/22   Verified address: [facility]  [address]   Medication will be filled on 11/09/22.

## 2022-11-08 ENCOUNTER — Other Ambulatory Visit (HOSPITAL_COMMUNITY): Payer: Self-pay | Admitting: Family

## 2022-11-16 ENCOUNTER — Other Ambulatory Visit: Payer: Self-pay

## 2022-11-17 ENCOUNTER — Ambulatory Visit: Payer: Medicaid Other | Admitting: Neurology

## 2022-11-17 DIAGNOSIS — G43709 Chronic migraine without aura, not intractable, without status migrainosus: Secondary | ICD-10-CM

## 2022-11-17 MED ORDER — ONABOTULINUMTOXINA 100 UNITS IJ SOLR
155.0000 [IU] | Freq: Once | INTRAMUSCULAR | Status: AC
  Administered 2022-11-17: 155 [IU] via INTRAMUSCULAR

## 2022-11-17 MED ORDER — ONDANSETRON 8 MG PO TBDP: 8.0000 mg | ORAL_TABLET | Freq: Three times a day (TID) | ORAL | 11 refills | Status: DC | PRN

## 2022-11-17 MED ORDER — BD INTEGRA SYRINGE 25G X 1" 3 ML MISC: 0 refills | Status: DC

## 2022-11-17 MED ORDER — ONABOTULINUMTOXINA 200 UNITS IJ SOLR
155.0000 [IU] | Freq: Once | INTRAMUSCULAR | Status: DC
Start: 2022-11-17 — End: 2023-10-25

## 2022-11-17 MED ORDER — KETOROLAC TROMETHAMINE 60 MG/2ML IM SOLN: INTRAMUSCULAR | 4 refills | Status: DC

## 2022-11-17 NOTE — Progress Notes (Signed)
Botox- 100 units x 2 vial Lot: [id number]  Expiration: 09/2024 NDC: 9811-9147-82  Bacteriostatic 0.9% Sodium Chloride- 4 mL  Lot: [id number] Expiration: 04/11/2023 NDC: 0865-7846-96   Dx: E95.284 S/P Witnessed by Delmer Islam

## 2022-11-17 NOTE — Progress Notes (Signed)
Consent Form Botulism Toxin Injection For Chronic Migraine  11/19/2022:  Baseline >15 (30 daily headaches) headache days a month and >10(15-20) migraine days a month. Excellent response, she has > 50% reduction in migraines and headache frequ and severity monthly. Discussed acute management prescribed frova, zembrace works quickly but the migraines rebound. Did great on Nurtec and we prescribed and she continues to take(ubrelvy does not work as well). Patient felt her forehead and eyebrows were uncomfortable due to inability to move them, reduced injections 1/2 injections in the corrigators and procerus, did the frontalis very high and was much better. She feels clenchings in her masseters and pterygoids make her migraines worse has tried muscle relaxer at bedtime will put 10 units in each masseter for follow the pain. She also has temple pain.   Dry needling gave her a very bad migraine will not order that again. Aimovig  helped in the past and se wants to try it again for the remainder of there migraines 8 migraines a month and 15 migraine days a month. ubrelvy not as good, get nurtec instead when she has < 8 migraine days a month and toradol 2-3x a month  Meds ordered this encounter  Medications   botulinum toxin Type A (BOTOX) injection 155 Units    Botox- 200 units x 1 vial Lot: [id number]  Expiration: 09/2024 NDC: 8756-4332-95  Bacteriostatic 0.9% Sodium Chloride- 4 mL  Lot: [id number] Expiration: 04/11/2023 NDC: 6063-0160-10   Dx: X32.355 S/P Witnessed by Sandy,RN   ondansetron (ZOFRAN-ODT) 8 MG disintegrating tablet    Sig: Take 1 tablet (8 mg total) by mouth every 8 (eight) hours as needed for nausea or vomiting.    Dispense:  30 tablet    Refill:  11   ketorolac (TORADOL) 60 MG/2ML SOLN injection    Sig: Inject 1-40ml (30-60mg ) intramuscularly at onset of migraine. May repeat in 6 hours. Max twice a day and 4 days per month.    Dispense:  10 mL    Refill:  4   SYRINGE-NEEDLE, DISP,  3 ML (BD INTEGRA SYRINGE) 25G X 1" 3 ML MISC    Sig: ATTACH NEEDLE TO SYRINGE AND USE TO DRAW UP AND ADMINISTER TORADOL. DO NOT REUSE.    Dispense:  4 each    Refill:  0   botulinum toxin Type A (BOTOX) injection 155 Units    Botox- 100 units x 2 vial Lot: [id number]  Expiration: 09/2024 NDC: 4270-6237-62  Bacteriostatic 0.9% Sodium Chloride- 4 mL  Lot: [id number] Expiration: 04/11/2023 NDC: 6160-7371-06   Dx: Y69.485 S/P Witnessed by Murlean Hark (AIMOVIG) 140 MG/ML SOAJ    Sig: Inject 140 mg into the skin every 30 (thirty) days.    Dispense:  1.12 mL    Refill:  11  08/18/2022:stable 02/22/2022: Stable  12/05/2021: She is doing excellent. On Botox migraines have been reduced to 4 migraine days a month and <10 total headache days a month. Exceleent response.  For migraines that come on quickly will prescribe zavzpret.  09/07/2021: On Botox migraines have been reduced to 4 migraine days a month and <10 total headache days a month. Exceleent response.   06/08/2021: Baseline 15 headache days a month and 10 migraine days a month. Excellent response, she has > 75% reduction in migraines and headaches monthlyPatient felt her forehead and eyebrows were uncomfortable due to inability to move them, reduced injections 1/2 injections in the corrigators and procerus, did the frontalis very high and was much better. She  is a clencher her masseters and pterygoids hurt unfortunately try muscle relaxer at bedtime. She also has temple pain.   Dry needling gave her a very bad migraine will not order that again. Aimovig gave her constipation and we stopped due to insurance. Doing so well doesn't even need to consider aimovig* at this time 03/16/2021: She had to put her dog down at [place] Genevie Cheshire was 16. She had a very bad migraine.  When she has a migraine we use Ubrelvy. Try getting her aimovig  09/15/2020: Baseline >15 headache days a month and >10 migraine days a month. Excellent response, she has >  75% reduction in migraines and headaches monthly. Discussed acute management prescribed frova, zembrace works quickly but the migraines rebound. Did great on Nurtec and we prescribed and she continues to take. Patient felt her forehead and eyebrows were uncomfortable due to inability to move them, reduced injections 1/2 injections in the corrigators and procerus, did the frontalis very high and was much better. 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 She also has temple pain.   Dry needling gave her a very bad migraine will not order that again. Aimovig gave her constipation and we stopped due to insurance. Doing so well doesn't even need to consider aimovig* at this time   Reviewed orally with patient, additionally signature is on file:  Botulism toxin has been approved by the Federal drug administration for treatment of chronic migraine. Botulism toxin does not cure chronic migraine and it may not be effective in some patients.  The administration of botulism toxin is accomplished by injecting a small amount of toxin into the muscles of the neck and head. Dosage must be titrated for each individual. Any benefits resulting from botulism toxin tend to wear off after 3 months with a repeat injection required if benefit is to be maintained. Injections are usually done every 3-4 months with maximum effect peak achieved by about 2 or 3 weeks. Botulism toxin is expensive and you should be sure of what costs you will incur resulting from the injection.  The side effects of botulism toxin use for chronic migraine may include:   -Transient, and usually mild, facial weakness with facial injections  -Transient, and usually mild, head or neck weakness with head/neck injections  -Reduction or loss of forehead facial animation due to forehead muscle weakness  -Eyelid drooping  -Dry eye  -Pain at the site of injection or bruising at the site of  injection  -Double vision  -Potential unknown long term risks  Contraindications: You should not have Botox if you are pregnant, nursing, allergic to albumin, have an infection, skin condition, or muscle weakness at the site of the injection, or have myasthenia gravis, Lambert-Eaton syndrome, or ALS.  It is also possible that as with any injection, there may be an allergic reaction or no effect from the medication. Reduced effectiveness after repeated injections is sometimes seen and rarely infection at the injection site may occur. All care will be taken to prevent these side effects. If therapy is given over a long time, atrophy and wasting in the muscle injected may occur. Occasionally the patient's become refractory to treatment because they develop antibodies to the toxin. In this event, therapy needs to be modified.  I have read the above information and consent to the administration of botulism toxin.    BOTOX PROCEDURE NOTE FOR MIGRAINE HEADACHE    Contraindications and precautions discussed with patient(above).  Aseptic procedure was observed and patient tolerated procedure. Procedure performed by Dr. Artemio Aly  The condition has existed for more than 6 months, and pt does not have a diagnosis of ALS, Myasthenia Gravis or Lambert-Eaton Syndrome.  Risks and benefits of injections discussed and pt agrees to proceed with the procedure.  Written consent obtained  These injections are medically necessary. Pt  receives good benefits from these injections. These injections do not cause sedations or hallucinations which the oral therapies may cause.  Indication/Diagnosis: chronic migraine BOTOX(J0585) injection was performed according to protocol by [employer]. 200 units of BOTOX was dissolved into 4 cc NS.   NDC: [address]-77   Description of procedure:  The patient was placed in a sitting position. The standard protocol was used for Botox as follows, with 5 units of Botox injected at  each site:   -Procerus muscle, midline injection 2.5 units   -Corrugator muscle, bilateral injection 2.5 units each  -Frontalis muscle, bilateral injection, with 2 sites each side, medial injection was performed in the upper one third of the frontalis muscle, in the region vertical from the medial inferior edge of the superior orbital rim. The lateral injection was again in the upper one third of the forehead vertically above the lateral limbus of the cornea, 1.5 cm lateral to the medial injection site.  -Temporalis muscle injection, 4 sites, bilaterally. The first injection was 3 cm above the tragus of the ear, second injection site was 1.5 cm to 3 cm up from the first injection site in line with the tragus of the ear. The third injection site was 1.5-3 cm forward between the first 2 injection sites. The fourth injection site was 1.5 cm posterior to the second injection site. .  -Occipitalis muscle injection, 3 sites, bilaterally. The first injection was done one half way between the occipital protuberance and the tip of the mastoid process behind the ear. The second injection site was done lateral and superior to the first, 1 fingerbreadth from the first injection. The third injection site was 1 fingerbreadth superiorly and medially from the first injection site.  -Cervical paraspinal muscle injection, 2 sites, bilateral knee first injection site was 1 cm from the midline of the cervical spine, 3 cm inferior to the lower border of the occipital protuberance. The second injection site was 1.5 cm superiorly and laterally to the first injection site.  -Trapezius muscle injection was performed at 3 sites, bilaterally. The first injection site was in the upper trapezius muscle halfway between the inflection point of the neck, and the acromion. The second injection site was one half way between the acromion and the first injection site. The third injection was done between the first injection site and the  inflection point of the neck.   Will return for repeat injection in 3 months.   155 unit sof Botox was used, 45 Botox was wasted. The patient tolerated the procedure well, there were no complications of the above procedure.

## 2022-11-19 MED ORDER — AIMOVIG 140 MG/ML ~~LOC~~ SOAJ: 140.0000 mg | SUBCUTANEOUS | 11 refills | Status: DC

## 2022-11-21 ENCOUNTER — Telehealth (HOSPITAL_COMMUNITY): Payer: Self-pay | Admitting: Family

## 2022-11-21 NOTE — Telephone Encounter (Cosign Needed)
Wanted to follow-up regarding medication management.  This provider left a HIPAA compliant voicemail.  Patient to follow-up schedule virtual appointment to discuss options.

## 2022-11-28 ENCOUNTER — Encounter (HOSPITAL_COMMUNITY): Payer: Self-pay | Admitting: Family

## 2022-11-28 ENCOUNTER — Telehealth (HOSPITAL_COMMUNITY): Payer: Medicaid Other | Admitting: Family

## 2022-11-28 ENCOUNTER — Encounter: Payer: Self-pay | Admitting: Neurology

## 2022-11-28 DIAGNOSIS — F411 Generalized anxiety disorder: Secondary | ICD-10-CM | POA: Diagnosis not present

## 2022-11-28 DIAGNOSIS — F902 Attention-deficit hyperactivity disorder, combined type: Secondary | ICD-10-CM | POA: Diagnosis not present

## 2022-11-28 DIAGNOSIS — F319 Bipolar disorder, unspecified: Secondary | ICD-10-CM | POA: Diagnosis not present

## 2022-11-28 MED ORDER — AMPHETAMINE-DEXTROAMPHETAMINE 10 MG PO TABS: 10.0000 mg | ORAL_TABLET | Freq: Every day | ORAL | 0 refills | Status: DC

## 2022-11-28 MED ORDER — LITHIUM CARBONATE ER 300 MG PO TBCR: 600.0000 mg | EXTENDED_RELEASE_TABLET | Freq: Every evening | ORAL | 0 refills | Status: DC

## 2022-11-28 MED ORDER — DIVALPROEX SODIUM ER 500 MG PO TB24: 1500.0000 mg | ORAL_TABLET | Freq: Every day | ORAL | 0 refills | Status: DC

## 2022-11-28 NOTE — Progress Notes (Addendum)
BH MD/PA/NP OP Progress Note  11/28/2022 12:00 PM Margaret Lopez  MRN:  [id number]  Chief Complaint:   HPI: Margaret Lopez 52 year old Caucasian female was seen and evaluated for medication management appointment.  Patient currently carries a diagnosis related to major depressive disorder, generalized anxiety disorder, bipolar 1 disorder and attention deficit disorder combined.  patient recently submitted psychological evaluation for attention deficit hyper activity disorder, which was confirmed 01/26/2022.  Currently has a diagnosis DSM-5-attention deficit/hyperactivity disorder combination combined presentation moderate.  She is currently seeking medication to assist with her reported symptoms.  This provider discussed apprehension related to starting controlled medications due to diagnoses related to anxiety and bipolar disorder.  Discussed hypervigilant related to hyper/hypomanic symptoms and possibly psychosis.  Discussed Adderall could trigger headaches/migraines, increase blood pressure/heart rate and changes in appetites, discussed stimulant can potentially cause worsening anxiety and or trigger migraines.  Patient made aware of risk and precautions.  Discussed initiating Adderall 10 mg daily.  Follow-up 4 weeks for medication tolerability/adherence.   Additional recommendations as documented per psychologist. setting a timer to complete tasks, breaking tasks into manageable times with breaks.  Utilizing calendars and checklist utilizing visual reminders/posted and working on Equities trader.  No concerns related to suicidal or homicidal ideations during this visit.  She does report struggling with migraines where she is feeling lingering hangover effects.    During evaluation Margaret Lopez is standing, patient was seen via video assessment. she is alert/oriented x 4; calm/cooperative; and mood congruent with affect. Slightly sluggish, stated lingering "hangover effects from a migraine"    Patient is speaking in a clear tone at moderate volume, and normal pace; with good eye contact. Her thought process is coherent and relevant; There is no indication that she is currently responding to internal/external stimuli or experiencing delusional thought content.  Patient denies suicidal/self-harm/homicidal ideation, psychosis, and paranoia.  Patient has remained calm throughout assessment and has answered questions appropriately.    Visit Diagnosis:    ICD-10-CM   1. Attention deficit hyperactivity disorder, combined type, moderate  F90.2     2. Bipolar 1 disorder, depressed (HCC)  F31.9     3. GAD (generalized anxiety disorder)  F41.1       Past Psychiatric History: See chart  Past Medical History:  Past Medical History:  Diagnosis Date   Anxiety    Asthma    Bipolar disorder (HCC)    Depression    Deviated nasal septum 02/2011   Generalized anxiety disorder 04/16/2013   GERD (gastroesophageal reflux disease)    Headache(784.0)    migraines   Insomnia    Nasal turbinate hypertrophy 02/2011   bilat.   Neuromuscular disorder (HCC)    Dr. Judie Petit. [facility] Burly- does injections around nerve in her head for prevention of migraines - q 3 months    Pneumonia    hx of    PONV (postoperative nausea and vomiting)    Seasonal allergies     Past Surgical History:  Procedure Laterality Date   ABDOMINAL HYSTERECTOMY     CARPAL TUNNEL RELEASE Right    DIAGNOSTIC LAPAROSCOPY     EXCISION MASS LOWER EXTREMETIES Left 06/21/2018   Procedure: Excision of left upper leg fat necrosis;  Surgeon: Peggye Form, DO;  Location: [facility];  Service: Plastics;  Laterality: Left;   FOOT SURGERY Right    bone removed from great toe    GASTRIC ROUX-EN-Y N/A 05/16/2016   Procedure: LAPAROSCOPIC ROUX-EN-Y GASTRIC BYPASS WITH UPPER ENDOSCOPY;  Surgeon: Berna Bue, MD;  Location: [facility];  Service: General;  Laterality: N/A;   LAPAROSCOPIC VAGINAL HYSTERECTOMY  03/14/2007   LIPOMA  EXCISION Left 08/26/2016   Procedure: EXCISION LIPOMA FROM LEFT UPPER POSTERIOR THIGH;  Surgeon: Berna Bue, MD;  Location: [facility];  Service: General;  Laterality: Left;   MYRINGOTOMY WITH TUBE PLACEMENT Bilateral 03/02/2015   Procedure: MYRINGOTOMY WITH T-TUBE PLACEMENT;  Surgeon: Newman Pies, MD;  Location: [facility];  Service: ENT;  Laterality: Bilateral;   NASAL SEPTOPLASTY W/ TURBINOPLASTY  02/22/2011   Procedure: NASAL SEPTOPLASTY WITH TURBINATE REDUCTION;  Surgeon: Darletta Moll, MD;  Location: [facility];  Service: ENT;  Laterality: Bilateral;   TUBAL LIGATION  12/16/2004   TYMPANOSTOMY TUBE PLACEMENT Bilateral 07/21/2017    Family Psychiatric History: See chart  Family History:  Family History  Problem Relation Age of Onset   ADD / ADHD Sister    Hypertension Mother    Sarcoidosis Mother    Rheum arthritis Mother    Hypertension Father    Heart Problems Father    Alcohol abuse Maternal Grandmother    Bipolar disorder Maternal Grandmother    Breast cancer Maternal Grandmother    Breast cancer Maternal Aunt    Heart Problems Other        dad's side of family   Suicidality Neg Hx     Social History:  Social History   Socioeconomic History   Marital status: Legally Separated    Spouse name: Not on file   Number of children: 2   Years of education: Not on file   Highest education level: Some college, no degree  Occupational History   Not on file  Tobacco Use   Smoking status: Former    Current packs/day: 0.00    Average packs/day: 0.5 packs/day for 4.0 years (2.0 ttl pk-yrs)    Types: Cigarettes    Start date: 11/12/2011    Quit date: 11/12/2015    Years since quitting: 7.0   Smokeless tobacco: Never  Vaping Use   Vaping status: Never Used  Substance and Sexual Activity   Alcohol use: No    Alcohol/week: 0.0 standard drinks of alcohol    Comment: once a month or less   Drug use: No   Sexual activity: Yes    Partners: Male    Birth  control/protection: Surgical    Comment: Hysterectomy  Other Topics Concern   Not on file  Social History Narrative   Lives at home with fiance', his son, and her children come and go from the home (25 y.o. and 50 y.o.)   Right handed   Caffeine: 2 cups daily   Social Determinants of Health   Financial Resource Strain: Not on file  Food Insecurity: Not on file  Transportation Needs: Not on file  Physical Activity: Sufficiently Active (11/17/2016)   Exercise Vital Sign    Days of Exercise per Week: 5 days    Minutes of Exercise per Session: 40 min  Stress: No Stress Concern Present (11/17/2016)   [employer] of Occupational Health - Occupational Stress Questionnaire    Feeling of Stress : Only a little  Social Connections: Not on file    Allergies:  Allergies  Allergen Reactions   Doxazosin Nausea And Vomiting   Morphine And Codeine Itching   Phenergan [Promethazine Hcl]     " knocks out for several hours"   Aloe Hives   Penicillins Rash    Has  patient had a PCN reaction causing immediate rash, facial/tongue/throat swelling, SOB or lightheadedness with hypotension: No Has patient had a PCN reaction causing severe rash involving mucus membranes or skin necrosis: No Has patient had a PCN reaction that required hospitalization No Has patient had a PCN reaction occurring within the last 10 years: No If all of the above answers are "NO", then may proceed with Cephalosporin use.    Sulfa Antibiotics Rash    Metabolic Disorder Labs: No results found for: "HGBA1C", "MPG" No results found for: "PROLACTIN" No results found for: "CHOL", "TRIG", "HDL", "CHOLHDL", "VLDL", "LDLCALC" Lab Results  Component Value Date   TSH 4.610 (H) 05/31/2022   TSH 4.690 (H) 11/04/2020    Therapeutic Level Labs: Lab Results  Component Value Date   LITHIUM 1.0 05/31/2022   LITHIUM 0.8 11/04/2020   Lab Results  Component Value Date   VALPROATE 91 05/31/2022   VALPROATE 30 (L)  11/04/2020   No results found for: "CBMZ"  Current Medications: Current Outpatient Medications  Medication Sig Dispense Refill   amphetamine-dextroamphetamine (ADDERALL) 10 MG tablet Take 1 tablet (10 mg total) by mouth daily with breakfast. 30 tablet 0   albuterol (PROVENTIL HFA;VENTOLIN HFA) 108 (90 Base) MCG/ACT inhaler Inhale 1-2 puffs into the lungs every 6 (six) hours as needed for wheezing or shortness of breath.     ALPRAZolam (XANAX) 0.5 MG tablet Take 1 tablet (0.5 mg total) by mouth 2 (two) times daily as needed for anxiety. 60 tablet 5   atomoxetine (STRATTERA) 18 MG capsule Take 1 capsule (18 mg total) by mouth daily. 30 capsule 0   botulinum toxin Type A (BOTOX) 100 units SOLR injection Provider to inject 155 units into the muscles of the head and neck every 3 months. Discard remainder. 1 each 3   botulinum toxin Type A (BOTOX) 100 units SOLR injection Inject 155 units into head and neck muscles, discard remainder. 2 each 2   cyclobenzaprine (FLEXERIL) 10 MG tablet TAKE 1/2 TO 1 TABLET(5 TO 10 MG) BY MOUTH AT BEDTIME AS NEEDED 90 tablet 4   divalproex (DEPAKOTE ER) 500 MG 24 hr tablet Take 3 tablets (1,500 mg total) by mouth daily. 270 tablet 1   EPINEPHrine 0.3 mg/0.3 mL IJ SOAJ injection Inject 0.3 mg into the muscle daily as needed (for anaphylatic reactions.).  1   Erenumab-aooe (AIMOVIG) 140 MG/ML SOAJ Inject 140 mg into the skin every 30 (thirty) days. 1.12 mL 11   gabapentin (NEURONTIN) 100 MG capsule Take 1 capsule (100 mg total) by mouth 3 (three) times daily as needed. 90 capsule 6   hydrOXYzine (ATARAX/VISTARIL) 25 MG tablet Take 25 mg by mouth at bedtime as needed for itching.  3   ipratropium (ATROVENT) 0.06 % nasal spray Place 2 sprays into both nostrils daily as needed. Allergies, runny nose.  4   ketorolac (TORADOL) 60 MG/2ML SOLN injection Inject 1-18ml (30-60mg ) intramuscularly at onset of migraine. May repeat in 6 hours. Max twice a day and 4 days per month. 10 mL  4   levocetirizine (XYZAL) 5 MG tablet Take 5 mg by mouth at bedtime.      lithium carbonate (LITHOBID) 300 MG ER tablet Take 2 tablets (600 mg total) by mouth every evening. 180 tablet 1   methylPREDNISolone (MEDROL DOSEPAK) 4 MG TBPK tablet Take pills daily all together with food. Take the first dose (6 pills) as soon as possible. Take the rest each morning. For 6 days total 6-5-4-3-2-1. 21 tablet 1  methylPREDNISolone (MEDROL DOSEPAK) 4 MG TBPK tablet TAKE AS DIRECTED WITH FOOD 21 each 0   Multiple Vitamins-Minerals (OPURITY BYPASS OPTIMIZED) CHEW Chew 1 tablet by mouth daily.     naratriptan (AMERGE) 2.5 MG tablet TAKE 1 TABLET BY MOUTH FOR HEADACHE OR MIGRAINE. DO NOT EXCEED 2 DOSES OF ANY TRIPTAN IN 1 DAY 10 tablet 6   ondansetron (ZOFRAN-ODT) 8 MG disintegrating tablet Take 1 tablet (8 mg total) by mouth every 8 (eight) hours as needed for nausea or vomiting. 30 tablet 11   prochlorperazine (COMPAZINE) 10 MG tablet Take 1 tablet (10 mg total) by mouth 3 (three) times daily as needed for nausea or vomiting (migraine). MAY CAUSE SEDATION. 30 tablet 3   Rimegepant Sulfate (NURTEC) 75 MG TBDP Take 75 mg by mouth daily as needed. For migraines. Take as close to onset of migraine as possible. One daily maximum. 16 tablet 11   SUMAtriptan (TOSYMRA) 10 MG/ACT SOLN Place 10 mg into the nose every hour as needed. 2 each 0   SUMAtriptan Succinate (ZEMBRACE SYMTOUCH) 3 MG/0.5ML SOAJ Inject 3 mg into the skin every 1 hour as needed for migraine. Max 4 injections in one day. 12 mL 11   SYRINGE-NEEDLE, DISP, 3 ML (BD INTEGRA SYRINGE) 25G X 1" 3 ML MISC ATTACH NEEDLE TO SYRINGE AND USE TO DRAW UP AND ADMINISTER TORADOL. DO NOT REUSE. 4 each 0   valACYclovir (VALTREX) 500 MG tablet Take 500 mg by mouth See admin instructions. Takes 500mg  daily for 5 days when she has a flare up.  3   Zavegepant HCl (ZAVZPRET) 10 MG/ACT SOLN Place 1 spray into the nose daily as needed. PATIENT HAS COPAY CARD WILL BRING 6 each 11    Current Facility-Administered Medications  Medication Dose Route Frequency Provider Last Rate Last Admin   botulinum toxin Type A (BOTOX) injection 100 Units  100 Units Intramuscular Once Anson Fret, MD       botulinum toxin Type A (BOTOX) injection 155 Units  155 Units Intramuscular Once Anson Fret, MD         Musculoskeletal: Teleassessment  Psychiatric Specialty Exam: Review of Systems  There were no vitals taken for this visit.There is no height or weight on file to calculate BMI.  General Appearance: Casual flat depressed slightly desponded.   Eye Contact:  Fair  Speech:  Clear and Coherent  Volume:  Normal  Mood:  Anxious and Depressed  Affect:  Congruent  Thought Process:  Coherent  Orientation:  Full (Time, Place, and Person)  Thought Content: Logical   Suicidal Thoughts:  No  Homicidal Thoughts:  No  Memory:  Immediate;   Good Recent;   Good  Judgement:  Good  Insight:  Good  Psychomotor Activity:  Normal  Concentration:  Concentration: Good  Recall:  Good  Fund of Knowledge: Good  Language: Good  Akathisia:  No  Handed:  Right  AIMS (if indicated): not done  Assets:  Communication Skills Desire for Improvement Social Support  ADL's:  Intact  Cognition: WNL  Sleep:  Fair   Screenings: PHQ2-9    Flowsheet Row [facility]/17/20[facility] Video Visit from 03/31/2022 in [facility] Video Visit from 10[facility]14/2021 in [facility] Video Visit from 07[facility]15/2021 in [facility] Video Visit from 04[facility]29/2021 in [facility]  PHQ-2 Total Score 3 0 0 0 2  PHQ-9 Total Score 18 -- -- --  3      Flowsheet Row Video Visit from 03/31/2022 in [facility] Video Visit from [facility]14[facility]2021 [facility] Video Visit from 07/15/2021 in [facility]  C-SSRS RISK CATEGORY No Risk No Risk No Risk        Assessment and Plan: Margaret Lopez 52 year old Caucasian female presents for medication management.  Patient reports she is interested in starting medications for her attention deficit disorder.  Chart reviewed patient was evaluated by [facility] psychological evaluation diagnosed with attention deficit hyperactivity disorder combined presentation moderate.  Previous visit patient was prescribed Strattera 18 mg daily which she reports no benefit and was ineffective for her symptoms. Discussed risk and precautions related to initiating Adderall and current diagnoses related to generalized anxiety disorder and bipolar 1 disorder.  Patient appeared receptive and understood risk and precautions and is willing to try medications at this time.  Attention deficit disorder combined inattentive (moderate) Initiated Adderall 10 mg p.o. daily follow-up 4 weeks for adherence and tolerability Refills with lithium 600 mg and Depakote 1500 mg daily  Collaboration of Care: Collaboration of Care: Medication Management AEB adderall 10 mg daily  Patient/Guardian was advised Release of Information must be obtained prior to any record release in order to collaborate their care with an outside provider. Patient/Guardian was advised if they have not already done so to contact the registration department to sign all necessary forms in order for [name] to release information regarding their care.   Consent: Patient/Guardian gives verbal consent for treatment and assignment of benefits for services provided during this visit. Patient/Guardian expressed understanding and agreed to proceed.    [name], NP 11/28/2022, 12:00 PM

## 2022-11-29 ENCOUNTER — Encounter: Payer: Self-pay | Admitting: Neurology

## 2022-11-29 ENCOUNTER — Ambulatory Visit (HOSPITAL_COMMUNITY): Payer: Medicaid Other | Admitting: Family

## 2022-11-30 NOTE — Progress Notes (Signed)
Virtual Visit via Video Note  I connected with Margaret Lopez on 11/28/22 at  9:30 AM EST by a video enabled telemedicine application and verified that I am speaking with the correct person using two identifiers.  Location: Patient: Home Provider: Office   I discussed the limitations of evaluation and management by telemedicine and the availability of in person appointments. The patient expressed understanding and agreed to proceed.    I discussed the assessment and treatment plan with the patient. The patient was provided an opportunity to ask questions and all were answered. The patient agreed with the plan and demonstrated an understanding of the instructions.   The patient was advised to call back or seek an in-person evaluation if the symptoms worsen or if the condition fails to improve as anticipated.  I provided 00 minutes of non-face-to-face time during this encounter.   [name], NP   BH MD/PA/NP OP Progress Note   11/28/2022 12:00 PM Margaret Lopez  MRN:  [id number]   Chief Complaint:    HPI: Margaret Lopez 52 year old Caucasian female was seen and evaluated for medication management appointment.  Patient currently carries a diagnosis related to major depressive disorder, generalized anxiety disorder, bipolar 1 disorder and attention deficit disorder combined.  patient recently submitted psychological evaluation for attention deficit hyper activity disorder, which was confirmed 01/26/2022.  Currently has a diagnosis DSM-5-attention deficit/hyperactivity disorder combination combined presentation moderate.  She is currently seeking medication to assist with her reported symptoms.  This provider discussed apprehension related to starting controlled medications due to diagnoses related to anxiety and bipolar disorder.  Discussed hypervigilant related to hyper/hypomanic symptoms and possibly psychosis.  Discussed Adderall could trigger headaches/migraines, increase blood  pressure/heart rate and changes in appetites, discussed stimulant can potentially cause worsening anxiety and or trigger migraines.  Patient made aware of risk and precautions.  Discussed initiating Adderall 10 mg daily.  Follow-up 4 weeks for medication tolerability/adherence.    Additional recommendations as documented per psychologist. setting a timer to complete tasks, breaking tasks into manageable times with breaks.  Utilizing calendars and checklist utilizing visual reminders/posted and working on Equities [employer].  No concerns related to suicidal or homicidal ideations during this visit.  She does report struggling with migraines where she is feeling lingering hangover effects.      During evaluation SHAVAWN GAUTNEY is standing, patient was seen via video assessment. she is alert/oriented x 4; calm/cooperative; and mood congruent with affect. Slightly sluggish, stated lingering "hangover effects from a migraine"   Patient is speaking in a clear tone at moderate volume, and normal pace; with good eye contact. Her thought process is coherent and relevant; There is no indication that she is currently responding to internal/external stimuli or experiencing delusional thought content.  Patient denies suicidal/self-harm/homicidal ideation, psychosis, and paranoia.  Patient has remained calm throughout assessment and has answered questions appropriately.      Visit Diagnosis:      ICD-10-CM    1. Attention deficit hyperactivity disorder, combined type, moderate  F90.2       2. Bipolar 1 disorder, depressed (HCC)  F31.9       3. GAD (generalized anxiety disorder)  F41.1           Past Psychiatric History: See chart   Past Medical History:      Past Medical History:  Diagnosis Date   Anxiety     Asthma     Bipolar disorder (HCC)     Depression  Deviated nasal septum 02/2011   Generalized anxiety disorder 04/16/2013   GERD (gastroesophageal reflux disease)     Headache(784.0)       migraines   Insomnia     Nasal turbinate hypertrophy 02/2011    bilat.   Neuromuscular disorder (HCC)      Dr. Judie Petit. [facility]- does injections around nerve in her head for prevention of migraines - q 3 months    Pneumonia      hx of    PONV (postoperative nausea and vomiting)     Seasonal allergies               Past Surgical History:  Procedure Laterality Date   ABDOMINAL HYSTERECTOMY       CARPAL TUNNEL RELEASE Right     DIAGNOSTIC LAPAROSCOPY       EXCISION MASS LOWER EXTREMETIES Left 06/21/2018    Procedure: Excision of left upper leg fat necrosis;  Surgeon: Peggye Form, DO;  Location: [facility];  Service: Plastics;  Laterality: Left;   FOOT SURGERY Right      bone removed from great toe    GASTRIC ROUX-EN-Y N/A 05/16/2016    Procedure: LAPAROSCOPIC ROUX-EN-Y GASTRIC BYPASS WITH UPPER ENDOSCOPY;  Surgeon: Berna Bue, MD;  Location: [facility];  Service: General;  Laterality: N/A;   LAPAROSCOPIC VAGINAL HYSTERECTOMY   03/14/2007   LIPOMA EXCISION Left 08/26/2016    Procedure: EXCISION LIPOMA FROM LEFT UPPER POSTERIOR THIGH;  Surgeon: Berna Bue, MD;  Location: [facility];  Service: General;  Laterality: Left;   MYRINGOTOMY WITH TUBE PLACEMENT Bilateral 03/02/2015    Procedure: MYRINGOTOMY WITH T-TUBE PLACEMENT;  Surgeon: Newman Pies, MD;  Location: [facility];  Service: ENT;  Laterality: Bilateral;   NASAL SEPTOPLASTY W/ TURBINOPLASTY   02/22/2011    Procedure: NASAL SEPTOPLASTY WITH TURBINATE REDUCTION;  Surgeon: Darletta Moll, MD;  Location: [facility];  Service: ENT;  Laterality: Bilateral;   TUBAL LIGATION   12/16/2004   TYMPANOSTOMY TUBE PLACEMENT Bilateral 07/21/2017          Family Psychiatric History: See chart   Family History:       Family History  Problem Relation Age of Onset   ADD / ADHD Sister     Hypertension Mother     Sarcoidosis Mother     Rheum arthritis Mother     Hypertension Father     Heart Problems  Father     Alcohol abuse Maternal Grandmother     Bipolar disorder Maternal Grandmother     Breast cancer Maternal Grandmother     Breast cancer Maternal Aunt     Heart Problems Other          dad's side of family   Suicidality Neg Hx            Social History:  Social History         Socioeconomic History   Marital status: Legally Separated      Spouse name: Not on file   Number of children: 2   Years of education: Not on file   Highest education level: Some college, no degree  Occupational History   Not on file  Tobacco Use   Smoking status: Former      Current packs/day: 0.00      Average packs/day: 0.5 packs/day for 4.0 years (2.0 ttl pk-yrs)      Types: Cigarettes      Start date: 11/12/2011  Quit date: 11/12/2015      Years since quitting: 7.0   Smokeless tobacco: Never  Vaping Use   Vaping status: Never Used  Substance and Sexual Activity   Alcohol use: No      Alcohol/week: 0.0 standard drinks of alcohol      Comment: once a month or less   Drug use: No   Sexual activity: Yes      Partners: Male      Birth control/protection: Surgical      Comment: Hysterectomy  Other Topics Concern   Not on file  Social History Narrative    Lives at home with fiance', his son, and her children come and go from the home (24 y.o. and 50 y.o.)    Right handed    Caffeine: 2 cups daily    Social Determinants of Health        Financial Resource Strain: Not on file  Food Insecurity: Not on file  Transportation Needs: Not on file  Physical Activity: Sufficiently Active (11/17/2016)    Exercise Vital Sign     Days of Exercise per Week: 5 days     Minutes of Exercise per Session: 40 min  Stress: No Stress Concern Present (11/17/2016)    [employer] of Occupational Health - Occupational Stress Questionnaire     Feeling of Stress : Only a little  Social Connections: Not on file      Allergies:  Allergies       Allergies  Allergen Reactions   Doxazosin Nausea  And Vomiting   Morphine And Codeine Itching   Phenergan [Promethazine Hcl]        " knocks out for several hours"   Aloe Hives   Penicillins Rash      Has patient had a PCN reaction causing immediate rash, facial/tongue/throat swelling, SOB or lightheadedness with hypotension: No Has patient had a PCN reaction causing severe rash involving mucus membranes or skin necrosis: No Has patient had a PCN reaction that required hospitalization No Has patient had a PCN reaction occurring within the last 10 years: No If all of the above answers are "NO", then may proceed with Cephalosporin use.     Sulfa Antibiotics Rash        Metabolic Disorder Labs: Recent Labs  No results found for: "HGBA1C", "MPG"   Recent Labs  No results found for: "PROLACTIN"   Recent Labs  No results found for: "CHOL", "TRIG", "HDL", "CHOLHDL", "VLDL", "LDLCALC"   Recent Labs       Lab Results  Component Value Date    TSH 4.610 (H) 05/31/2022    TSH 4.690 (H) 11/04/2020        Therapeutic Level Labs: Recent Labs       Lab Results  Component Value Date    LITHIUM 1.0 05/31/2022    LITHIUM 0.8 11/04/2020      Recent Labs       Lab Results  Component Value Date    VALPROATE 91 05/31/2022    VALPROATE 30 (L) 11/04/2020      Recent Labs  No results found for: "CBMZ"     Current Medications:       Current Outpatient Medications  Medication Sig Dispense Refill   amphetamine-dextroamphetamine (ADDERALL) 10 MG tablet Take 1 tablet (10 mg total) by mouth daily with breakfast. 30 tablet 0   albuterol (PROVENTIL HFA;VENTOLIN HFA) 108 (90 Base) MCG/ACT inhaler Inhale 1-2 puffs into the lungs every 6 (six) hours as needed for  wheezing or shortness of breath.       ALPRAZolam (XANAX) 0.5 MG tablet Take 1 tablet (0.5 mg total) by mouth 2 (two) times daily as needed for anxiety. 60 tablet 5   atomoxetine (STRATTERA) 18 MG capsule Take 1 capsule (18 mg total) by mouth daily. 30 capsule 0   botulinum toxin  Type A (BOTOX) 100 units SOLR injection Provider to inject 155 units into the muscles of the head and neck every 3 months. Discard remainder. 1 each 3   botulinum toxin Type A (BOTOX) 100 units SOLR injection Inject 155 units into head and neck muscles, discard remainder. 2 each 2   cyclobenzaprine (FLEXERIL) 10 MG tablet TAKE 1/2 TO 1 TABLET(5 TO 10 MG) BY MOUTH AT BEDTIME AS NEEDED 90 tablet 4   divalproex (DEPAKOTE ER) 500 MG 24 hr tablet Take 3 tablets (1,500 mg total) by mouth daily. 270 tablet 1   EPINEPHrine 0.3 mg/0.3 mL IJ SOAJ injection Inject 0.3 mg into the muscle daily as needed (for anaphylatic reactions.).   1   Erenumab-aooe (AIMOVIG) 140 MG/ML SOAJ Inject 140 mg into the skin every 30 (thirty) days. 1.12 mL 11   gabapentin (NEURONTIN) 100 MG capsule Take 1 capsule (100 mg total) by mouth 3 (three) times daily as needed. 90 capsule 6   hydrOXYzine (ATARAX/VISTARIL) 25 MG tablet Take 25 mg by mouth at bedtime as needed for itching.   3   ipratropium (ATROVENT) 0.06 % nasal spray Place 2 sprays into both nostrils daily as needed. Allergies, runny nose.   4   ketorolac (TORADOL) 60 MG/2ML SOLN injection Inject 1-60ml (30-60mg ) intramuscularly at onset of migraine. May repeat in 6 hours. Max twice a day and 4 days per month. 10 mL 4   levocetirizine (XYZAL) 5 MG tablet Take 5 mg by mouth at bedtime.        lithium carbonate (LITHOBID) 300 MG ER tablet Take 2 tablets (600 mg total) by mouth every evening. 180 tablet 1   methylPREDNISolone (MEDROL DOSEPAK) 4 MG TBPK tablet Take pills daily all together with food. Take the first dose (6 pills) as soon as possible. Take the rest each morning. For 6 days total 6-5-4-3-2-1. 21 tablet 1   methylPREDNISolone (MEDROL DOSEPAK) 4 MG TBPK tablet TAKE AS DIRECTED WITH FOOD 21 each 0   Multiple Vitamins-Minerals (OPURITY BYPASS OPTIMIZED) CHEW Chew 1 tablet by mouth daily.       naratriptan (AMERGE) 2.5 MG tablet TAKE 1 TABLET BY MOUTH FOR HEADACHE OR  MIGRAINE. DO NOT EXCEED 2 DOSES OF ANY TRIPTAN IN 1 DAY 10 tablet 6   ondansetron (ZOFRAN-ODT) 8 MG disintegrating tablet Take 1 tablet (8 mg total) by mouth every 8 (eight) hours as needed for nausea or vomiting. 30 tablet 11   prochlorperazine (COMPAZINE) 10 MG tablet Take 1 tablet (10 mg total) by mouth 3 (three) times daily as needed for nausea or vomiting (migraine). MAY CAUSE SEDATION. 30 tablet 3   Rimegepant Sulfate (NURTEC) 75 MG TBDP Take 75 mg by mouth daily as needed. For migraines. Take as close to onset of migraine as possible. One daily maximum. 16 tablet 11   SUMAtriptan (TOSYMRA) 10 MG/ACT SOLN Place 10 mg into the nose every hour as needed. 2 each 0   SUMAtriptan Succinate (ZEMBRACE SYMTOUCH) 3 MG/0.5ML SOAJ Inject 3 mg into the skin every 1 hour as needed for migraine. Max 4 injections in one day. 12 mL 11   SYRINGE-NEEDLE, DISP, 3 ML (BD  INTEGRA SYRINGE) 25G X 1" 3 ML MISC ATTACH NEEDLE TO SYRINGE AND USE TO DRAW UP AND ADMINISTER TORADOL. DO NOT REUSE. 4 each 0   valACYclovir (VALTREX) 500 MG tablet Take 500 mg by mouth See admin instructions. Takes 500mg  daily for 5 days when she has a flare up.   3   Zavegepant HCl (ZAVZPRET) 10 MG/ACT SOLN Place 1 spray into the nose daily as needed. PATIENT HAS COPAY CARD WILL BRING 6 each 11               Current Facility-Administered Medications  Medication Dose Route Frequency Provider Last Rate Last Admin   botulinum toxin Type A (BOTOX) injection 100 Units  100 Units Intramuscular Once Anson Fret, MD       botulinum toxin Type A (BOTOX) injection 155 Units  155 Units Intramuscular Once Anson Fret, MD              Musculoskeletal: Teleassessment   Psychiatric Specialty Exam: Review of Systems  There were no vitals taken for this visit.There is no height or weight on file to calculate BMI.  General Appearance: Casual flat depressed slightly desponded.   Eye Contact:  Fair  Speech:  Clear and Coherent  Volume:   Normal  Mood:  Anxious and Depressed  Affect:  Congruent  Thought Process:  Coherent  Orientation:  Full (Time, Place, and Person)  Thought Content: Logical   Suicidal Thoughts:  No  Homicidal Thoughts:  No  Memory:  Immediate;   Good Recent;   Good  Judgement:  Good  Insight:  Good  Psychomotor Activity:  Normal  Concentration:  Concentration: Good  Recall:  Good  Fund of Knowledge: Good  Language: Good  Akathisia:  No  Handed:  Right  AIMS (if indicated): not done  Assets:  Communication Skills Desire for Improvement Social Support  ADL's:  Intact  Cognition: WNL  Sleep:  Fair    Screenings: PHQ2-9     Flowsheet Row Office Visit from [facility]/17/20[facility] Video Visit from 03/31/2022 in [facility] Video Visit from 10/14/2021 in [facility] Video Visit from 07[facility]15/2021 in [facility] Video Visit from 04[facility]29/2021 in [facility]  PHQ-2 Total Score 3 0 0 0 2  PHQ-9 Total Score 18 -- -- -- 3         Flowsheet Row Video Visit from 03/31/2022 in [facility] Video Visit from 10/14/2021 in [facility] Video Visit from 07/15/2021 in [facility]  C-SSRS RISK CATEGORY No Risk No Risk No Risk             Assessment and Plan: Lavanna Rauda 52 year old Caucasian female presents for medication management.  Patient reports she is interested in starting medications for her attention deficit disorder.  Chart reviewed patient was evaluated by [facility] psychological evaluation diagnosed with attention deficit hyperactivity disorder combined presentation moderate.  Previous visit patient was prescribed Strattera 18 mg daily which she reports no benefit and was  ineffective for her symptoms. Discussed risk and precautions related to initiating Adderall and current diagnoses related to generalized anxiety disorder and bipolar 1 disorder.  Patient appeared receptive and understood risk and precautions and is willing to try medications at this time.   Attention deficit disorder combined inattentive (moderate) Initiated Adderall 10 mg p.o. daily follow-up 4 weeks for adherence and  tolerability Refills with lithium 600 mg and Depakote 1500 mg daily   Collaboration of Care: Collaboration of Care: Medication Management AEB adderall 10 mg daily   Patient/Guardian was advised Release of Information must be obtained prior to any record release in order to collaborate their care with an outside provider. Patient/Guardian was advised if they have not already done so to contact the registration department to sign all necessary forms in order for [name] to release information regarding their care.    Consent: Patient/Guardian gives verbal consent for treatment and assignment of benefits for services provided during this visit. Patient/Guardian expressed understanding and agreed to proceed.      [name], NP 11/28/2022, 12:00 PM

## 2022-12-05 ENCOUNTER — Other Ambulatory Visit (HOSPITAL_COMMUNITY): Payer: Self-pay

## 2022-12-05 ENCOUNTER — Telehealth: Payer: Self-pay

## 2022-12-05 NOTE — Telephone Encounter (Signed)
Pharmacy Patient Advocate Encounter   Received notification from Patient Advice Request messages that prior authorization for Nurtec 75MG  dispersible tablets is required/requested.   Insurance verification completed.   The patient is insured through [facility] .   Per test claim: PA required; PA submitted to above mentioned insurance via CoverMyMeds Key/confirmation #/EOC [facility] Status is pending

## 2022-12-05 NOTE — Telephone Encounter (Signed)
Pharmacy Patient Advocate Encounter   Received notification from Patient Advice Request messages that prior authorization for Aimovig 140MG /ML auto-injectors is required/requested.   Insurance verification completed.   The patient is insured through [facility] .   Per test claim: PA required; PA submitted to above mentioned insurance via CoverMyMeds Key/confirmation #/EOC [id number] Status is pending

## 2022-12-06 ENCOUNTER — Other Ambulatory Visit (HOSPITAL_COMMUNITY): Payer: Self-pay

## 2022-12-06 NOTE — Telephone Encounter (Signed)
Pharmacy Patient Advocate Encounter  Received notification from [facility] that Prior Authorization for Aimovig 140MG /ML auto-injectors has been APPROVED from 12/05/2022 to 03/05/2023. Ran test claim, Copay is $4.00. This test claim was processed through [facility]- copay amounts may vary at other pharmacies due to pharmacy/plan contracts, or as the patient moves through the different stages of their insurance plan.   PA #/Case ID/Reference #: PA Case ID #: [id number]

## 2022-12-06 NOTE — Telephone Encounter (Signed)
Pt has been notified.

## 2022-12-06 NOTE — Telephone Encounter (Signed)
Pharmacy Patient Advocate Encounter  Received notification from [facility] that Prior Authorization for Nurtec 75MG  dispersible tablets has been Approved from 12/05/2022 thru 12/05/2023. Unable to obtain copay due to refill too soon rejection. Last filled on 11/18/2022.   PA #/Case ID/Reference #: PA Case ID #: [id number]

## 2022-12-06 NOTE — Telephone Encounter (Signed)
Noted  

## 2022-12-07 MED ORDER — NURTEC 75 MG PO TBDP: 75.0000 mg | ORAL_TABLET | Freq: Every day | ORAL | 11 refills | Status: DC | PRN

## 2022-12-07 NOTE — Addendum Note (Signed)
Addended by: Bertram Savin on: 12/07/2022 09:19 AM   Modules accepted: Orders

## 2022-12-15 ENCOUNTER — Other Ambulatory Visit (HOSPITAL_BASED_OUTPATIENT_CLINIC_OR_DEPARTMENT_OTHER): Payer: Self-pay

## 2022-12-15 MED ORDER — HYDROCHLOROTHIAZIDE 25 MG PO TABS
25.0000 mg | ORAL_TABLET | Freq: Every morning | ORAL | 0 refills | Status: DC
  Filled 2022-12-15: qty 30, 30d supply, fill #0

## 2022-12-15 MED ORDER — WEGOVY 0.5 MG/0.5ML ~~LOC~~ SOAJ
0.5000 mg | SUBCUTANEOUS | 0 refills | Status: DC
  Filled 2022-12-15: qty 2, 28d supply, fill #0

## 2022-12-20 ENCOUNTER — Other Ambulatory Visit: Payer: Self-pay | Admitting: Obstetrics and Gynecology

## 2022-12-20 ENCOUNTER — Encounter: Payer: Self-pay | Admitting: Obstetrics and Gynecology

## 2022-12-20 DIAGNOSIS — R928 Other abnormal and inconclusive findings on diagnostic imaging of breast: Secondary | ICD-10-CM

## 2022-12-21 ENCOUNTER — Other Ambulatory Visit (HOSPITAL_BASED_OUTPATIENT_CLINIC_OR_DEPARTMENT_OTHER): Payer: Self-pay

## 2022-12-21 ENCOUNTER — Encounter (HOSPITAL_COMMUNITY): Payer: Self-pay | Admitting: *Deleted

## 2022-12-23 ENCOUNTER — Other Ambulatory Visit (HOSPITAL_BASED_OUTPATIENT_CLINIC_OR_DEPARTMENT_OTHER): Payer: Self-pay

## 2022-12-28 ENCOUNTER — Telehealth (HOSPITAL_COMMUNITY): Payer: Medicaid Other | Admitting: Family

## 2022-12-28 DIAGNOSIS — F411 Generalized anxiety disorder: Secondary | ICD-10-CM

## 2022-12-28 DIAGNOSIS — F319 Bipolar disorder, unspecified: Secondary | ICD-10-CM

## 2022-12-28 NOTE — Progress Notes (Signed)
Virtual Visit via Telephone Note  I connected with Margaret Lopez on 12/28/22 at 11:30 AM EST by telephone and verified that I am speaking with the correct person using two identifiers.  Location: Patient: Work Provider: Office   I discussed the limitations, risks, security and privacy concerns of performing an evaluation and management service by telephone and the availability of in person appointments. I also discussed with the patient that there may be a patient responsible charge related to this service. The patient expressed understanding and agreed to proceed.   I discussed the assessment and treatment plan with the patient. The patient was provided an opportunity to ask questions and all were answered. The patient agreed with the plan and demonstrated an understanding of the instructions.   The patient was advised to call back or seek an in-person evaluation if the symptoms worsen or if the condition fails to improve as anticipated.  I provided 15 minutes of non-face-to-face time during this encounter.   Margaret Rack, NP   [facility] MD/PA/NP OP Progress Note  12/28/2022 9:03 AM Margaret Lopez  MRN:  [id number]  Chief Complaint: Margaret Lopez stated " my sister is getting married and moving away."   HPI: Margaret Lopez presented for medication management follow-up appointment.  She reports she has been taking her medications as indicated.  Reports her mood, concentration and focus has improved since starting Adderall.  Denying any side effects with medications.  States she occasionally had tactile hallucinations related to bugs crawling in her peripheral however states they have resolved.   Reports her current stressors are related to her sister getting married and moving 4 hours away from the entire family.  States she is second-guessing her medications or if she is having a overreaction to the current situation.  No concerns related to suicidal or homicidal ideations.  Denies auditory or visual  hallucinations.  Reports she has been taking her medications as indicated.  Margaret Lopez denied concerns related to hypertension, chest palpitations or increased anxiety while taking Adderall 10 mg daily.  Discussed due to current psychosocial stressors will continue current medications at recommended doses and follow-up 1 month for medication management.  She was receptive to plan.  Attention deficit disorder Mood disorder: Generalized anxiety disorder  Continue Adderall 10 mg daily-consideration with titration at follow-up appointment Continue lithium 600 mg daily Continue Depakote 1500 mg daily  During evaluation Margaret Lopez reports she is currently at work; stated overall her mood has stabilized.  She is speaking clear tone moderate volume normal pace.  Patient's thought process appears to be coherent and relevant no concerns related to delusional or paranoia thought content.  States experiencing tactile hallucinations related to bugs that was observed out of her brief feel however states those symptoms have resolved.  She remained calm and answer all questions appropriately throughout this assessment.     Visit Diagnosis:    ICD-10-CM   1. Bipolar 1 disorder, depressed (HCC)  F31.9     2. Generalized anxiety disorder  F41.1       Past Psychiatric History:   Past Medical History:  Past Medical History:  Diagnosis Date   Anxiety    Asthma    Bipolar disorder (HCC)    Depression    Deviated nasal septum 02/2011   Generalized anxiety disorder 04/16/2013   GERD (gastroesophageal reflux disease)    Headache(784.0)    migraines   Insomnia    Nasal turbinate hypertrophy 02/2011   bilat.   Neuromuscular disorder (  HCC)    Dr. Judie Petit. [facility]- does injections around nerve in her head for prevention of migraines - q 3 months    Pneumonia    hx of    PONV (postoperative nausea and vomiting)    Seasonal allergies     Past Surgical History:  Procedure Laterality Date   ABDOMINAL  HYSTERECTOMY     CARPAL TUNNEL RELEASE Right    DIAGNOSTIC LAPAROSCOPY     EXCISION MASS LOWER EXTREMETIES Left 06/21/2018   Procedure: Excision of left upper leg fat necrosis;  Surgeon: Peggye Form, DO;  Location: [name];  Service: Plastics;  Laterality: Left;   FOOT SURGERY Right    bone removed from great toe    GASTRIC ROUX-EN-Y N/A 05/16/2016   Procedure: LAPAROSCOPIC ROUX-EN-Y GASTRIC BYPASS WITH UPPER ENDOSCOPY;  Surgeon: Berna Bue, MD;  Location: [facility];  Service: General;  Laterality: N/A;   LAPAROSCOPIC VAGINAL HYSTERECTOMY  03/14/2007   LIPOMA EXCISION Left 08/26/2016   Procedure: EXCISION LIPOMA FROM LEFT UPPER POSTERIOR THIGH;  Surgeon: Berna Bue, MD;  Location: [facility];  Service: General;  Laterality: Left;   MYRINGOTOMY WITH TUBE PLACEMENT Bilateral 03/02/2015   Procedure: MYRINGOTOMY WITH T-TUBE PLACEMENT;  Surgeon: Newman Pies, MD;  Location: [facility];  Service: ENT;  Laterality: Bilateral;   NASAL SEPTOPLASTY W/ TURBINOPLASTY  02/22/2011   Procedure: NASAL SEPTOPLASTY WITH TURBINATE REDUCTION;  Surgeon: Darletta Moll, MD;  Location: [facility];  Service: ENT;  Laterality: Bilateral;   TUBAL LIGATION  12/16/2004   TYMPANOSTOMY TUBE PLACEMENT Bilateral 07/21/2017    Family Psychiatric History:   Family History:  Family History  Problem Relation Age of Onset   ADD / ADHD Sister    Hypertension Mother    Sarcoidosis Mother    Rheum arthritis Mother    Hypertension Father    Heart Problems Father    Alcohol abuse Maternal Grandmother    Bipolar disorder Maternal Grandmother    Breast cancer Maternal Grandmother    Breast cancer Maternal Aunt    Heart Problems Other        dad's side of family   Suicidality Neg Hx     Social History:  Social History   Socioeconomic History   Marital status: Legally Separated    Spouse name: Not on file   Number of children: 2   Years of education: Not on file    Highest education level: Some college, no degree  Occupational History   Not on file  Tobacco Use   Smoking status: Former    Current packs/day: 0.00    Average packs/day: 0.5 packs/day for 4.0 years (2.0 ttl pk-yrs)    Types: Cigarettes    Start date: 11/12/2011    Quit date: 11/12/2015    Years since quitting: 7.1   Smokeless tobacco: Never  Vaping Use   Vaping status: Never Used  Substance and Sexual Activity   Alcohol use: No    Alcohol/week: 0.0 standard drinks of alcohol    Comment: once a month or less   Drug use: No   Sexual activity: Yes    Partners: Male    Birth control/protection: Surgical    Comment: Hysterectomy  Other Topics Concern   Not on file  Social History Narrative   Lives at home with fiance', his son, and her children come and go from the home (56 y.o. and 50 y.o.)   Right handed   Caffeine: 2 cups  daily   Social Drivers of Health   Financial Resource Strain: Not on file  Food Insecurity: Not on file  Transportation Needs: Not on file  Physical Activity: Sufficiently Active (11/17/2016)   Exercise Vital Sign    Days of Exercise per Week: 5 days    Minutes of Exercise per Session: 40 min  Stress: No Stress Concern Present (11/17/2016)   [employer] of Occupational Health - Occupational Stress Questionnaire    Feeling of Stress : Only a little  Social Connections: Not on file    Allergies:  Allergies  Allergen Reactions   Doxazosin Nausea And Vomiting   Morphine And Codeine Itching   Phenergan [Promethazine Hcl]     " knocks out for several hours"   Aloe Hives   Penicillins Rash    Has patient had a PCN reaction causing immediate rash, facial/tongue/throat swelling, SOB or lightheadedness with hypotension: No Has patient had a PCN reaction causing severe rash involving mucus membranes or skin necrosis: No Has patient had a PCN reaction that required hospitalization No Has patient had a PCN reaction occurring within the last 10 years:  No If all of the above answers are "NO", then may proceed with Cephalosporin use.    Sulfa Antibiotics Rash    Metabolic Disorder Labs: No results found for: "HGBA1C", "MPG" No results found for: "PROLACTIN" No results found for: "CHOL", "TRIG", "HDL", "CHOLHDL", "VLDL", "LDLCALC" Lab Results  Component Value Date   TSH 4.610 (H) 05/31/2022   TSH 4.690 (H) 11/04/2020    Therapeutic Level Labs: Lab Results  Component Value Date   LITHIUM 1.0 05/31/2022   LITHIUM 0.8 11/04/2020   Lab Results  Component Value Date   VALPROATE 91 05/31/2022   VALPROATE 30 (L) 11/04/2020   No results found for: "CBMZ"  Current Medications: Current Outpatient Medications  Medication Sig Dispense Refill   albuterol (PROVENTIL HFA;VENTOLIN HFA) 108 (90 Base) MCG/ACT inhaler Inhale 1-2 puffs into the lungs every 6 (six) hours as needed for wheezing or shortness of breath.     ALPRAZolam (XANAX) 0.5 MG tablet Take 1 tablet (0.5 mg total) by mouth 2 (two) times daily as needed for anxiety. 60 tablet 5   amphetamine-dextroamphetamine (ADDERALL) 10 MG tablet Take 1 tablet (10 mg total) by mouth daily with breakfast. 30 tablet 0   botulinum toxin Type A (BOTOX) 100 units SOLR injection Provider to inject 155 units into the muscles of the head and neck every 3 months. Discard remainder. 1 each 3   botulinum toxin Type A (BOTOX) 100 units SOLR injection Inject 155 units into head and neck muscles, discard remainder. 2 each 2   cyclobenzaprine (FLEXERIL) 10 MG tablet TAKE 1/2 TO 1 TABLET(5 TO 10 MG) BY MOUTH AT BEDTIME AS NEEDED 90 tablet 4   divalproex (DEPAKOTE ER) 500 MG 24 hr tablet Take 3 tablets (1,500 mg total) by mouth daily. 180 tablet 0   EPINEPHrine 0.3 mg/0.3 mL IJ SOAJ injection Inject 0.3 mg into the muscle daily as needed (for anaphylatic reactions.).  1   Erenumab-aooe (AIMOVIG) 140 MG/ML SOAJ Inject 140 mg into the skin every 30 (thirty) days. 1.12 mL 11   gabapentin (NEURONTIN) 100 MG  capsule Take 1 capsule (100 mg total) by mouth 3 (three) times daily as needed. 90 capsule 6   hydrochlorothiazide (HYDRODIURIL) 25 MG tablet Take 1 tablet (25 mg total) by mouth every morning. 30 tablet 0   hydrOXYzine (ATARAX/VISTARIL) 25 MG tablet Take 25 mg by mouth  at bedtime as needed for itching.  3   ipratropium (ATROVENT) 0.06 % nasal spray Place 2 sprays into both nostrils daily as needed. Allergies, runny nose.  4   ketorolac (TORADOL) 60 MG/2ML SOLN injection Inject 1-68ml (30-60mg ) intramuscularly at onset of migraine. May repeat in 6 hours. Max twice a day and 4 days per month. 10 mL 4   levocetirizine (XYZAL) 5 MG tablet Take 5 mg by mouth at bedtime.      lithium carbonate (LITHOBID) 300 MG ER tablet Take 2 tablets (600 mg total) by mouth every evening. 120 tablet 0   methylPREDNISolone (MEDROL DOSEPAK) 4 MG TBPK tablet Take pills daily all together with food. Take the first dose (6 pills) as soon as possible. Take the rest each morning. For 6 days total 6-5-4-3-2-1. 21 tablet 1   methylPREDNISolone (MEDROL DOSEPAK) 4 MG TBPK tablet TAKE AS DIRECTED WITH FOOD 21 each 0   Multiple Vitamins-Minerals (OPURITY BYPASS OPTIMIZED) CHEW Chew 1 tablet by mouth daily.     naratriptan (AMERGE) 2.5 MG tablet TAKE 1 TABLET BY MOUTH FOR HEADACHE OR MIGRAINE. DO NOT EXCEED 2 DOSES OF ANY TRIPTAN IN 1 DAY 10 tablet 6   ondansetron (ZOFRAN-ODT) 8 MG disintegrating tablet Take 1 tablet (8 mg total) by mouth every 8 (eight) hours as needed for nausea or vomiting. 30 tablet 11   prochlorperazine (COMPAZINE) 10 MG tablet Take 1 tablet (10 mg total) by mouth 3 (three) times daily as needed for nausea or vomiting (migraine). MAY CAUSE SEDATION. 30 tablet 3   Rimegepant Sulfate (NURTEC) 75 MG TBDP Take 1 tablet (75 mg total) by mouth daily as needed. For migraines. Take as close to onset of migraine as possible. One daily maximum. 16 tablet 11   Semaglutide-Weight Management (WEGOVY) 0.5 MG/0.5ML SOAJ Inject  0.5 mg into the skin once a week. 2 mL 0   SUMAtriptan (TOSYMRA) 10 MG/ACT SOLN Place 10 mg into the nose every hour as needed. 2 each 0   SUMAtriptan Succinate (ZEMBRACE SYMTOUCH) 3 MG/0.5ML SOAJ Inject 3 mg into the skin every 1 hour as needed for migraine. Max 4 injections in one day. 12 mL 11   SYRINGE-NEEDLE, DISP, 3 ML (BD INTEGRA SYRINGE) 25G X 1" 3 ML MISC ATTACH NEEDLE TO SYRINGE AND USE TO DRAW UP AND ADMINISTER TORADOL. DO NOT REUSE. 4 each 0   valACYclovir (VALTREX) 500 MG tablet Take 500 mg by mouth See admin instructions. Takes 500mg  daily for 5 days when she has a flare up.  3   Zavegepant HCl (ZAVZPRET) 10 MG/ACT SOLN Place 1 spray into the nose daily as needed. PATIENT HAS COPAY CARD WILL BRING 6 each 11   Current Facility-Administered Medications  Medication Dose Route Frequency Provider Last Rate Last Admin   botulinum toxin Type A (BOTOX) injection 100 Units  100 Units Intramuscular Once Anson Fret, MD       botulinum toxin Type A (BOTOX) injection 155 Units  155 Units Intramuscular Once Anson Fret, MD         Musculoskeletal: Telephonically   Psychiatric Specialty Exam: Review of Systems  Psychiatric/Behavioral:  Positive for decreased concentration. Hallucinations: tactial.The patient is nervous/anxious.     There were no vitals taken for this visit.There is no height or weight on file to calculate BMI.  General Appearance: NA  Eye Contact:  NA  Speech:  Clear and Coherent  Volume:  Normal  Mood:  Anxious  Affect:  NA  Thought Process:  Coherent  Orientation:  Full (Time, Place, and Person)  Thought Content: Logical   Suicidal Thoughts:  No  Homicidal Thoughts:  No  Memory:  Immediate;   Fair Remote;   Fair  Judgement:  Fair  Insight:  Good  Psychomotor Activity:  NA  Concentration:  Concentration: Fair improving   Recall:  Good  Fund of Knowledge: Good  Language: Good  Akathisia:  No  Handed:  Right  AIMS (if indicated): not done   Assets:  Desire for Improvement Intimacy  ADL's:  Intact  Cognition: WNL  Sleep:  Good   Screenings: PHQ2-9    Flowsheet Row Office Visit from 10/17/2022 in [facility] Video Visit from 03/31/2022 in [facility] Video Visit from 10[facility]14/2021 in [facility] Video Visit from 07[facility]15/2021 in [facility] Video Visit from 04/29/2021 in [facility]  PHQ-2 Total Score 3 0 0 0 2  PHQ-9 Total Score 18 -- -- -- 3      Flowsheet Row Video Visit from 03/31/2022 in [facility] Video Visit from 10/14/2021 in [facility] Video Visit from 07[facility]15/2021 in [facility]  C-SSRS RISK CATEGORY No Risk No Risk No Risk        Assessment and Plan: Uriyah Claeys 52 year old Caucasian female presents for medication management appointment.  She was recently started on Adderall 10 mg daily which she reports she has been taking and tolerating well.  Denying any medication side effects i.e. headaches chest pain nausea vomiting or dizziness.  Has concerns with titrating Adderall to 15 to 20 mg for added benefits.  Patient shared some recent psychosocial stressors related to family dynamics discussed continuing medication at current doses and will revisit titration at follow-up visit.  She was receptive to plan. Support, encouragement and  reassurance was provided.  Collaboration of Care: Collaboration of Care: Attention deficit disorder Mood disorder: Generalized anxiety disorder  Continue Adderall 10 mg daily-consideration with titration at follow-up appointment Continue lithium 600 mg daily Continue Depakote 1500 mg daily  Patient/Guardian was advised Release of Information must be obtained prior to any record  release in order to collaborate their care with an outside provider. Patient/Guardian was advised if they have not already done so to contact the registration department to sign all necessary forms in order for [name] to release information regarding their care.   Consent: Patient/Guardian gives verbal consent for treatment and assignment of benefits for services provided during this visit. Patient/Guardian expressed understanding and agreed to proceed.    Margaret Rack, NP 12/28/2022, 9:03 AM

## 2022-12-29 ENCOUNTER — Other Ambulatory Visit: Payer: Medicaid Other

## 2022-12-29 ENCOUNTER — Ambulatory Visit
Admission: RE | Admit: 2022-12-29 | Discharge: 2022-12-29 | Disposition: A | Discharge status: Home / Self Care | Payer: Medicaid Other | Source: Ambulatory Visit | Attending: Obstetrics and Gynecology | Admitting: Obstetrics and Gynecology

## 2022-12-29 DIAGNOSIS — R928 Other abnormal and inconclusive findings on diagnostic imaging of breast: Secondary | ICD-10-CM

## 2023-01-02 ENCOUNTER — Other Ambulatory Visit (HOSPITAL_COMMUNITY): Payer: Self-pay | Admitting: Family

## 2023-01-02 MED ORDER — AMPHETAMINE-DEXTROAMPHETAMINE 10 MG PO TABS: 10.0000 mg | ORAL_TABLET | Freq: Every day | ORAL | 0 refills | Status: DC

## 2023-01-02 NOTE — Progress Notes (Signed)
Medications was refilled.

## 2023-01-05 ENCOUNTER — Other Ambulatory Visit: Payer: Self-pay | Admitting: Neurology

## 2023-01-05 DIAGNOSIS — G43709 Chronic migraine without aura, not intractable, without status migrainosus: Secondary | ICD-10-CM

## 2023-01-12 ENCOUNTER — Other Ambulatory Visit: Payer: Medicaid Other

## 2023-01-17 ENCOUNTER — Other Ambulatory Visit: Payer: Self-pay | Admitting: Neurology

## 2023-01-17 ENCOUNTER — Other Ambulatory Visit: Payer: Self-pay

## 2023-01-17 DIAGNOSIS — G43709 Chronic migraine without aura, not intractable, without status migrainosus: Secondary | ICD-10-CM

## 2023-01-17 MED ORDER — BD INTEGRA SYRINGE 25G X 1" 3 ML MISC: 0 refills | Status: AC

## 2023-01-17 MED ORDER — METHYLPREDNISOLONE 4 MG PO TBPK: ORAL_TABLET | ORAL | 0 refills | Status: DC

## 2023-01-20 ENCOUNTER — Other Ambulatory Visit: Payer: Self-pay

## 2023-01-23 ENCOUNTER — Other Ambulatory Visit: Payer: Self-pay

## 2023-01-26 ENCOUNTER — Other Ambulatory Visit (HOSPITAL_BASED_OUTPATIENT_CLINIC_OR_DEPARTMENT_OTHER): Payer: Self-pay

## 2023-01-26 MED ORDER — FAMOTIDINE 20 MG PO TABS
20.0000 mg | ORAL_TABLET | Freq: Two times a day (BID) | ORAL | 0 refills | Status: DC
  Filled 2023-01-26: qty 60, 30d supply, fill #0

## 2023-01-26 MED ORDER — WEGOVY 0.5 MG/0.5ML ~~LOC~~ SOAJ
0.5000 mg | SUBCUTANEOUS | 0 refills | Status: DC
  Filled 2023-01-26: qty 2, 28d supply, fill #0

## 2023-02-07 ENCOUNTER — Other Ambulatory Visit: Payer: Self-pay

## 2023-02-07 ENCOUNTER — Telehealth: Payer: Self-pay | Admitting: Neurology

## 2023-02-07 ENCOUNTER — Telehealth (HOSPITAL_BASED_OUTPATIENT_CLINIC_OR_DEPARTMENT_OTHER): Payer: Medicaid Other | Admitting: Family

## 2023-02-07 DIAGNOSIS — F902 Attention-deficit hyperactivity disorder, combined type: Secondary | ICD-10-CM | POA: Diagnosis not present

## 2023-02-07 DIAGNOSIS — F319 Bipolar disorder, unspecified: Secondary | ICD-10-CM | POA: Diagnosis not present

## 2023-02-07 DIAGNOSIS — Z5181 Encounter for therapeutic drug level monitoring: Secondary | ICD-10-CM | POA: Diagnosis not present

## 2023-02-07 NOTE — Telephone Encounter (Signed)
I received this message from [facility] regarding this patient's botox for her appointment this Thursday: we have tried to reach her multiple times with no luck but we will just go ahead and send it as office requested and mark we cannot reach pt! it should come tomorrow!

## 2023-02-07 NOTE — Progress Notes (Unsigned)
Virtual Visit via Video Note  I connected with Margaret Lopez on 02/07/23 at  3:30 PM EST by a video enabled telemedicine application and verified that I am speaking with the correct person using two identifiers.  Location: Patient: Home Provider: Office   I discussed the limitations of evaluation and management by telemedicine and the availability of in person appointments. The patient expressed understanding and agreed to proceed.   I discussed the assessment and treatment plan with the patient. The patient was provided an opportunity to ask questions and all were answered. The patient agreed with the plan and demonstrated an understanding of the instructions.   The patient was advised to call back or seek an in-person evaluation if the symptoms worsen or if the condition fails to improve as anticipated.  I provided 15 minutes of non-face-to-face time during this encounter.   [name], NP   [facility] MD/PA/NP OP Progress Note  02/07/2023 3:58 PM Margaret Lopez  MRN:  [id number]  Chief Complaint: Mood irritability  HPI: Margaret Lopez 53 year old female presents for medication management follow-up appointment.  She reports worsening depression and mood lability throughout the day.  Reports disagreements with multiple family members.  States she finds herself crying often.  Discussed last medication that was introduced for attention deficit disorder Adderall 10 mg daily. "  I was afraid that this would be attributing to low mood."  Noting benefits related to attention and focus however, patient reports experiencing increased depression.  Denied suicidal or homicidal ideations.  Prior to initiation of Adderall patient was taking lithium 600 mg and Depakote 1500 mg with mood stability.  Discussed discontinuing Adderall at this time, in hopes that mood will rebound.  Patient to follow-up for lithium/Depakote level as last labs was 6 months prior.   Attention deficit disorder combined  inattentive :(moderate) Bipolar disorder depressed: Discontinue Adderall 10 mg p.o.  Refills with lithium 600 mg and Depakote 1500 mg daily -Follow-up in office for valproic acid level and lithium level  No current concerns related to appetite and/or sleep disturbance.  Speaking in clear moderate volume normal place with good eye contact.  Thoughts are clear coherent and relevant.  No indication that patient is currently responding to internal or external stimuli.  She remained calm throughout assessment answered all questions appropriately.  No concerns with suicidal or homicidal ideations.  Patient to follow-up 6 weeks.   Visit Diagnosis:    ICD-10-CM   1. Bipolar 1 disorder, depressed (HCC)  F31.9     2. Attention deficit hyperactivity disorder, combined type, moderate  F90.2     3. Encounter for therapeutic drug level monitoring  Z51.81       Past Psychiatric History: See chart  Past Medical History:  Past Medical History:  Diagnosis Date   Anxiety    Asthma    Bipolar disorder (HCC)    Depression    Deviated nasal septum 02/2011   Generalized anxiety disorder 04/16/2013   GERD (gastroesophageal reflux disease)    Headache(784.0)    migraines   Insomnia    Nasal turbinate hypertrophy 02/2011   bilat.   Neuromuscular disorder (HCC)    Dr. Judie Petit. [facility] Burly- does injections around nerve in her head for prevention of migraines - q 3 months    Pneumonia    hx of    PONV (postoperative nausea and vomiting)    Seasonal allergies     Past Surgical History:  Procedure Laterality Date   ABDOMINAL HYSTERECTOMY  CARPAL TUNNEL RELEASE Right    DIAGNOSTIC LAPAROSCOPY     EXCISION MASS LOWER EXTREMETIES Left 06/21/2018   Procedure: Excision of left upper leg fat necrosis;  Surgeon: Peggye Form, DO;  Location: [facility];  Service: Plastics;  Laterality: Left;   FOOT SURGERY Right    bone removed from great toe    GASTRIC ROUX-EN-Y N/A 05/16/2016   Procedure:  LAPAROSCOPIC ROUX-EN-Y GASTRIC BYPASS WITH UPPER ENDOSCOPY;  Surgeon: Berna Bue, MD;  Location: [facility];  Service: General;  Laterality: N/A;   LAPAROSCOPIC VAGINAL HYSTERECTOMY  03/14/2007   LIPOMA EXCISION Left 08/26/2016   Procedure: EXCISION LIPOMA FROM LEFT UPPER POSTERIOR THIGH;  Surgeon: Berna Bue, MD;  Location: [facility];  Service: General;  Laterality: Left;   MYRINGOTOMY WITH TUBE PLACEMENT Bilateral 03/02/2015   Procedure: MYRINGOTOMY WITH T-TUBE PLACEMENT;  Surgeon: Newman Pies, MD;  Location: [facility];  Service: ENT;  Laterality: Bilateral;   NASAL SEPTOPLASTY W/ TURBINOPLASTY  02/22/2011   Procedure: NASAL SEPTOPLASTY WITH TURBINATE REDUCTION;  Surgeon: Darletta Moll, MD;  Location: [facility];  Service: ENT;  Laterality: Bilateral;   TUBAL LIGATION  12/16/2004   TYMPANOSTOMY TUBE PLACEMENT Bilateral 07/21/2017    Family Psychiatric History:   Family History:  Family History  Problem Relation Age of Onset   ADD / ADHD Sister    Hypertension Mother    Sarcoidosis Mother    Rheum arthritis Mother    Hypertension Father    Heart Problems Father    Alcohol abuse Maternal Grandmother    Bipolar disorder Maternal Grandmother    Breast cancer Maternal Grandmother    Breast cancer Maternal Aunt    Heart Problems Other        dad's side of family   Suicidality Neg Hx     Social History:  Social History   Socioeconomic History   Marital status: Legally Separated    Spouse name: Not on file   Number of children: 2   Years of education: Not on file   Highest education level: Some college, no degree  Occupational History   Not on file  Tobacco Use   Smoking status: Former    Current packs/day: 0.00    Average packs/day: 0.5 packs/day for 4.0 years (2.0 ttl pk-yrs)    Types: Cigarettes    Start date: 11/12/2011    Quit date: 11/12/2015    Years since quitting: 7.2   Smokeless tobacco: Never  Vaping Use   Vaping status: Never Used   Substance and Sexual Activity   Alcohol use: No    Alcohol/week: 0.0 standard drinks of alcohol    Comment: once a month or less   Drug use: No   Sexual activity: Yes    Partners: Male    Birth control/protection: Surgical    Comment: Hysterectomy  Other Topics Concern   Not on file  Social History Narrative   Lives at home with fiance', his son, and her children come and go from the home (70 y.o. and 51 y.o.)   Right handed   Caffeine: 2 cups daily   Social Drivers of Health   Financial Resource Strain: Not on file  Food Insecurity: Not on file  Transportation Needs: Not on file  Physical Activity: Sufficiently Active (11/17/2016)   Exercise Vital Sign    Days of Exercise per Week: 5 days    Minutes of Exercise per Session: 40 min  Stress: No Stress Concern  Present (11/17/2016)   [employer] of Occupational Health - Occupational Stress Questionnaire    Feeling of Stress : Only a little  Social Connections: Not on file    Allergies:  Allergies  Allergen Reactions   Doxazosin Nausea And Vomiting   Morphine And Codeine Itching   Phenergan [Promethazine Hcl]     " knocks out for several hours"   Aloe Hives   Penicillins Rash    Has patient had a PCN reaction causing immediate rash, facial/tongue/throat swelling, SOB or lightheadedness with hypotension: No Has patient had a PCN reaction causing severe rash involving mucus membranes or skin necrosis: No Has patient had a PCN reaction that required hospitalization No Has patient had a PCN reaction occurring within the last 10 years: No If all of the above answers are "NO", then may proceed with Cephalosporin use.    Sulfa Antibiotics Rash    Metabolic Disorder Labs: No results found for: "HGBA1C", "MPG" No results found for: "PROLACTIN" No results found for: "CHOL", "TRIG", "HDL", "CHOLHDL", "VLDL", "LDLCALC" Lab Results  Component Value Date   TSH 4.610 (H) 05/31/2022   TSH 4.690 (H) 11/04/2020     Therapeutic Level Labs: Lab Results  Component Value Date   LITHIUM 1.0 05/31/2022   LITHIUM 0.8 11/04/2020   Lab Results  Component Value Date   VALPROATE 91 05/31/2022   VALPROATE 30 (L) 11/04/2020   No results found for: "CBMZ"  Current Medications: Current Outpatient Medications  Medication Sig Dispense Refill   albuterol (PROVENTIL HFA;VENTOLIN HFA) 108 (90 Base) MCG/ACT inhaler Inhale 1-2 puffs into the lungs every 6 (six) hours as needed for wheezing or shortness of breath.     ALPRAZolam (XANAX) 0.5 MG tablet Take 1 tablet (0.5 mg total) by mouth 2 (two) times daily as needed for anxiety. 60 tablet 5   amphetamine-dextroamphetamine (ADDERALL) 10 MG tablet Take 1 tablet (10 mg total) by mouth daily with breakfast. 60 tablet 0   botulinum toxin Type A (BOTOX) 100 units SOLR injection Provider to inject 155 units into the muscles of the head and neck every 3 months. Discard remainder. 1 each 3   botulinum toxin Type A (BOTOX) 100 units SOLR injection Inject 155 units into head and neck muscles, discard remainder. 2 each 2   cyclobenzaprine (FLEXERIL) 10 MG tablet TAKE 1/2 TO 1 TABLET(5 TO 10 MG) BY MOUTH AT BEDTIME AS NEEDED 90 tablet 4   divalproex (DEPAKOTE ER) 500 MG 24 hr tablet Take 3 tablets (1,500 mg total) by mouth daily. 180 tablet 0   EPINEPHrine 0.3 mg/0.3 mL IJ SOAJ injection Inject 0.3 mg into the muscle daily as needed (for anaphylatic reactions.).  1   Erenumab-aooe (AIMOVIG) 140 MG/ML SOAJ Inject 140 mg into the skin every 30 (thirty) days. 1.12 mL 11   famotidine (PEPCID) 20 MG tablet Take 1 tablet (20 mg total) by mouth 2 (two) times daily. 60 tablet 0   gabapentin (NEURONTIN) 100 MG capsule Take 1 capsule (100 mg total) by mouth 3 (three) times daily as needed. 90 capsule 6   hydrochlorothiazide (HYDRODIURIL) 25 MG tablet Take 1 tablet (25 mg total) by mouth every morning. 30 tablet 0   hydrOXYzine (ATARAX/VISTARIL) 25 MG tablet Take 25 mg by mouth at bedtime  as needed for itching.  3   ipratropium (ATROVENT) 0.06 % nasal spray Place 2 sprays into both nostrils daily as needed. Allergies, runny nose.  4   ketorolac (TORADOL) 60 MG/2ML SOLN injection Inject 1-2ml (30-60mg )  intramuscularly at onset of migraine. May repeat in 6 hours. Max twice a day and 4 days per month. 10 mL 4   levocetirizine (XYZAL) 5 MG tablet Take 5 mg by mouth at bedtime.      lithium carbonate (LITHOBID) 300 MG ER tablet Take 2 tablets (600 mg total) by mouth every evening. 120 tablet 0   methylPREDNISolone (MEDROL DOSEPAK) 4 MG TBPK tablet Take pills daily all together with food. Take the first dose (6 pills) as soon as possible. Take the rest each morning. For 6 days total 6-5-4-3-2-1. 21 tablet 1   methylPREDNISolone (MEDROL DOSEPAK) 4 MG TBPK tablet TAKE AS DIRECTED WITH FOOD 21 each 0   Multiple Vitamins-Minerals (OPURITY BYPASS OPTIMIZED) CHEW Chew 1 tablet by mouth daily.     naratriptan (AMERGE) 2.5 MG tablet TAKE 1 TABLET BY MOUTH FOR HEADACHE OR MIGRAINE. DO NOT EXCEED 2 DOSES OF ANY TRIPTAN IN 1 DAY 10 tablet 6   ondansetron (ZOFRAN-ODT) 8 MG disintegrating tablet Take 1 tablet (8 mg total) by mouth every 8 (eight) hours as needed for nausea or vomiting. 30 tablet 11   prochlorperazine (COMPAZINE) 10 MG tablet Take 1 tablet (10 mg total) by mouth 3 (three) times daily as needed for nausea or vomiting (migraine). MAY CAUSE SEDATION. 30 tablet 3   Rimegepant Sulfate (NURTEC) 75 MG TBDP Take 1 tablet (75 mg total) by mouth daily as needed. For migraines. Take as close to onset of migraine as possible. One daily maximum. 16 tablet 11   Semaglutide-Weight Management (WEGOVY) 0.5 MG/0.5ML SOAJ Inject 0.5 mg into the skin once a week. 2 mL 0   SUMAtriptan (TOSYMRA) 10 MG/ACT SOLN Place 10 mg into the nose every hour as needed. 2 each 0   SUMAtriptan Succinate (ZEMBRACE SYMTOUCH) 3 MG/0.5ML SOAJ Inject 3 mg into the skin every 1 hour as needed for migraine. Max 4 injections in one  day. 12 mL 11   SYRINGE-NEEDLE, DISP, 3 ML (BD INTEGRA SYRINGE) 25G X 1" 3 ML MISC ATTACH NEEDLE TO SYRINGE AND USE TO DRAW UP AND ADMINISTER TORADOL. DO NOT REUSE. 4 each 0   valACYclovir (VALTREX) 500 MG tablet Take 500 mg by mouth See admin instructions. Takes 500mg  daily for 5 days when she has a flare up.  3   Zavegepant HCl (ZAVZPRET) 10 MG/ACT SOLN Place 1 spray into the nose daily as needed. PATIENT HAS COPAY CARD WILL BRING 6 each 11   Current Facility-Administered Medications  Medication Dose Route Frequency Provider Last Rate Last Admin   botulinum toxin Type A (BOTOX) injection 100 Units  100 Units Intramuscular Once Anson Fret, MD       botulinum toxin Type A (BOTOX) injection 155 Units  155 Units Intramuscular Once Anson Fret, MD         Musculoskeletal: Virtual platform  Psychiatric Specialty Exam: Review of Systems  Psychiatric/Behavioral:  Positive for decreased concentration (depression). Negative for suicidal ideas. The patient is nervous/anxious.   All other systems reviewed and are negative.   There were no vitals taken for this visit.There is no height or weight on file to calculate BMI.  General Appearance: Casual  Eye Contact:  Good  Speech:  Clear and Coherent  Volume:  Normal  Mood:  Depressed  Affect:  Congruent  Thought Process:  Coherent  Orientation:  Full (Time, Place, and Person)  Thought Content: Logical   Suicidal Thoughts:  No  Homicidal Thoughts:  No  Memory:  Immediate;  Good Recent;   Good  Judgement:  Good  Insight:  Good  Psychomotor Activity:  Normal  Concentration:  Concentration: Good  Recall:  Good  Fund of Knowledge: Good  Language: Good  Akathisia:  No  Handed:  Right  AIMS (if indicated): not done  Assets:  Communication Skills Desire for Improvement Resilience Social Support  ADL's:  Intact  Cognition: WNL  Sleep:  Fair   Screenings: PHQ2-9    Flowsheet Row [facility] Video Visit from 03/31/2022 in [facility] Video Visit from 10[facility]14/2021 in [facility] Video Visit from 07/15/2021 in [facility] Video Visit from 04/29/2021 in [facility]  PHQ-2 Total Score 3 0 0 0 2  PHQ-9 Total Score 18 -- -- -- 3      Flowsheet Row Video Visit from 03/31/2022 in [facility] Video Visit from 10/14/2021 in [facility] Video Visit from 07/15/2021 in [facility]  C-SSRS RISK CATEGORY No Risk No Risk No Risk        Assessment and Plan: Margaret Lopez 53 year old female presents for medication management appointment.  She reports worsening depression, mood irritability and bouts of sadness.  Discussed last medication introduced was Adderall prior to her symptoms starting patient felt stabilized with Depakote and lithium.  Patient encouraged to discontinue medication until mood returns to baseline.  Margaret Lopez is receptive to plan.  Will revisit stimulants versus nonstimulants at a later date.  Patient was prescribed Strattera in the past however unable to tolerate medication.   Patient to follow-up in office for CMP, CBC, valproic acid and Depakote level Continue lithium carbonate 600 mg nightly -Continue Depakote 1500 mg daily -Discontinue Adderall 10 mg 37-month follow-up for medication management  Collaboration of Care: Collaboration of Care: Medication Management AEB discontinued Adderall  Patient/Guardian was advised Release of Information must be obtained prior to any record release in order to collaborate their care with an outside provider. Patient/Guardian was advised if they have not already done so to contact the registration department to sign all necessary forms in  order for [name] to release information regarding their care.   Consent: Patient/Guardian gives verbal consent for treatment and assignment of benefits for services provided during this visit. Patient/Guardian expressed understanding and agreed to proceed.    [name], NP 02/07/2023, 3:58 PM

## 2023-02-07 NOTE — Progress Notes (Signed)
Specialty Pharmacy Refill Coordination Note  Margaret Lopez is a 53 y.o. female assessed today regarding refills of clinic administered specialty medication(s) OnabotulinumtoxinA (BOTOX) Spoke with [facility]  Clinic requested Courier to Provider Office   Delivery date: 02/08/23   Verified address: [facility]  [address]   Medication will be filled on 01.28.25.

## 2023-02-08 MED ORDER — LITHIUM CARBONATE ER 300 MG PO TBCR: 600.0000 mg | EXTENDED_RELEASE_TABLET | Freq: Every evening | ORAL | 0 refills | Status: DC

## 2023-02-08 MED ORDER — DIVALPROEX SODIUM ER 500 MG PO TB24: 1500.0000 mg | ORAL_TABLET | Freq: Every day | ORAL | 0 refills | Status: DC

## 2023-02-09 ENCOUNTER — Other Ambulatory Visit (HOSPITAL_COMMUNITY): Payer: Self-pay

## 2023-02-09 ENCOUNTER — Other Ambulatory Visit: Payer: Self-pay

## 2023-02-09 ENCOUNTER — Ambulatory Visit: Payer: Medicaid Other | Admitting: Neurology

## 2023-02-09 DIAGNOSIS — G43709 Chronic migraine without aura, not intractable, without status migrainosus: Secondary | ICD-10-CM | POA: Diagnosis not present

## 2023-02-09 MED ORDER — KETOROLAC TROMETHAMINE 60 MG/2ML IM SOLN
60.0000 mg | Freq: Once | INTRAMUSCULAR | Status: AC
  Administered 2023-02-09: 60 mg via INTRAMUSCULAR

## 2023-02-09 MED ORDER — ONABOTULINUMTOXINA 100 UNITS IJ SOLR
155.0000 [IU] | Freq: Once | INTRAMUSCULAR | Status: AC
  Administered 2023-02-09: 155 [IU] via INTRAMUSCULAR

## 2023-02-09 NOTE — Progress Notes (Signed)
Toradol 60 mg IM x 1 given to patient in LUOQ of L buttock per v.o. Dr Lucia Gaskins. Pt tolerated well. Aseptic technique maintained. See MAR.

## 2023-02-09 NOTE — Patient Instructions (Signed)
Get Vyepti approved In the meantime Qulipta  Eptinezumab Injection What is this medication? EPTINEZUMAB (EP ti NEZ ue mab) prevents migraines. It works by blocking a substance in the body that causes migraines. It is a monoclonal antibody. This medicine may be used for other purposes; ask your health care provider or pharmacist if you have questions. COMMON BRAND NAME(S): Vyepti What should I tell my care team before I take this medication? They need to know if you have any of these conditions: An unusual or allergic reaction to eptinezumab, other medications, foods, dyes, or preservatives Pregnant or trying to get pregnant Breast-feeding How should I use this medication? This medication is injected into a vein. It is given by your care team in a hospital or clinic setting. Talk to your care team about the use of this medication in children. Special care may be needed. Overdosage: If you think you have taken too much of this medicine contact a poison control center or emergency room at once. NOTE: This medicine is only for you. Do not share this medicine with others. What if I miss a dose? Keep appointments for follow-up doses. It is important not to miss your dose. Call your care team if you are unable to keep an appointment. What may interact with this medication? Interactions are not expected. This list may not describe all possible interactions. Give your health care provider a list of all the medicines, herbs, non-prescription drugs, or dietary supplements you use. Also tell them if you smoke, drink alcohol, or use illegal drugs. Some items may interact with your medicine. What should I watch for while using this medication? Your condition will be monitored carefully while you are receiving this medication. What side effects may I notice from receiving this medication? Side effects that you should report to your care team as soon as possible: Allergic reactions or angioedema--skin  rash, itching or hives, swelling of the face, eyes, lips, tongue, arms, or legs, trouble swallowing or breathing Side effects that usually do not require medical attention (report to your care team if they continue or are bothersome): Runny or stuffy nose This list may not describe all possible side effects. Call your doctor for medical advice about side effects. You may report side effects to FDA at 1-800-FDA-1088. Where should I keep my medication? This medication is given in a hospital or clinic. It will not be stored at home. NOTE: This sheet is a summary. It may not cover all possible information. If you have questions about this medicine, talk to your doctor, pharmacist, or health care provider.  2024 Elsevier/Gold Standard (2021-02-22 00:00:00)Atogepant Tablets What is this medication? ATOGEPANT (a TOE je pant) prevents migraines. It works by blocking a substance in the body that causes migraines. This medicine may be used for other purposes; ask your health care provider or pharmacist if you have questions. COMMON BRAND NAME(S): QULIPTA What should I tell my care team before I take this medication? They need to know if you have any of these conditions: Kidney disease Liver disease An unusual or allergic reaction to atogepant, other medications, foods, dyes, or preservatives Pregnant or trying to get pregnant Breast-feeding How should I use this medication? Take this medication by mouth with water. Take it as directed on the prescription label at the same time every day. You can take it with or without food. If it upsets your stomach, take it with food. Keep taking it unless your care team tells you to stop. Talk to your  care team about the use of this medication in children. Special care may be needed. Overdosage: If you think you have taken too much of this medicine contact a poison control center or emergency room at once. NOTE: This medicine is only for you. Do not share this  medicine with others. What if I miss a dose? If you miss a dose, take it as soon as you can. If it is almost time for your next dose, take only that dose. Do not take double or extra doses. What may interact with this medication? Carbamazepine Certain medications for fungal infections, such as itraconazole, ketoconazole Clarithromycin Cyclosporine Efavirenz Etravirine Phenytoin Rifampin St. John's wort This list may not describe all possible interactions. Give your health care provider a list of all the medicines, herbs, non-prescription drugs, or dietary supplements you use. Also tell them if you smoke, drink alcohol, or use illegal drugs. Some items may interact with your medicine. What should I watch for while using this medication? Visit your care team for regular checks on your progress. Tell your care team if your symptoms do not start to get better or if they get worse. What side effects may I notice from receiving this medication? Side effects that you should report to your care team as soon as possible: Allergic reactions--skin rash, itching, hives, swelling of the face, lips, tongue, or throat Side effects that usually do not require medical attention (report to your care team if they continue or are bothersome): Constipation Fatigue Loss of appetite with weight loss Nausea This list may not describe all possible side effects. Call your doctor for medical advice about side effects. You may report side effects to FDA at 1-800-FDA-1088. Where should I keep my medication? Keep out of the reach of children and pets. Store at room temperature between 20 and 25 degrees C (68 and 77 degrees F). Get rid of any unused medication after the expiration date. To get rid of medications that are no longer needed or have expired: Take the medication to a medication take-back program. Check with your pharmacy or law enforcement to find a location. If you cannot return the medication, check the  label or package insert to see if the medication should be thrown out in the garbage or flushed down the toilet. If you are not sure, ask your care team. If it is safe to put it in the trash, take the medication out of the container. Mix the medication with cat litter, dirt, coffee grounds, or other unwanted substance. Seal the mixture in a bag or container. Put it in the trash. NOTE: This sheet is a summary. It may not cover all possible information. If you have questions about this medicine, talk to your doctor, pharmacist, or health care provider.  2024 Elsevier/Gold Standard (2021-02-15 00:00:00)

## 2023-02-09 NOTE — Progress Notes (Signed)
Botox- 100 units x 2 vial Lot: [id number], [id number] Expiration: 04/2025,  03/2025 NDC: 2130-8657-84   Bacteriostatic 0.9% Sodium Chloride- 4 mL  Lot: [id number] Expiration: 11/11/2023 NDC: 2841-3244-01    Dx: U27.253 S/P   Witnessed by Joellen Jersey RN

## 2023-02-12 MED ORDER — SODIUM CHLORIDE 0.9 % IV SOLN: 100.0000 mg | INTRAVENOUS | 4 refills | Status: AC

## 2023-02-12 NOTE — Progress Notes (Signed)
Consent Form Botulism Toxin Injection For Chronic Migraine 02/09/2023:  Baseline >30 daily headaches headache days a month and >15-20 migraine days a month. Excellent response, she has > 50% reduction in migraines and headache frequ and severity monthly but still with a burden of > 15 days a month and 8 migraine days a month. Will try to get Vyepti approved to layer on top of Botox.    11/19/2022:  Baseline >30 daily headaches headache days a month and >15-20 migraine days a month. Excellent response, she has > 50% reduction in migraines and headache frequ and severity monthly. Discussed acute management prescribed frova, zembrace works quickly but the migraines rebound. Did great on Nurtec and we prescribed and she continues to take(ubrelvy does not work as well). Patient felt her forehead and eyebrows were uncomfortable due to inability to move them, reduced injections 1/2 injections in the corrigators and procerus, did the frontalis very high and was much better. She feels clenchings in her masseters and pterygoids make her migraines worse has tried muscle relaxer at bedtime will put 10 units in each masseter for follow the pain. She also has temple pain.   Dry needling gave her a very bad migraine will not order that again. A  Meds ordered this encounter  Medications   botulinum toxin Type A (BOTOX) injection 155 Units    Botox- 100 units x 2 vial Lot: [id number], [id number] Expiration: 04/2025,  03/2025 NDC: 9147-8295-62   Dx: Z30.865 S/P   Witnessed by Joellen Jersey RN   ketorolac (TORADOL) injection 60 mg  08/18/2022:stable 02/22/2022: Stable  12/05/2021: She is doing excellent. On Botox migraines have been reduced to 4 migraine days a month and <10 total headache days a month. Exceleent response.  For migraines that come on quickly will prescribe zavzpret.  09/07/2021: On Botox migraines have been reduced to 4 migraine days a month and <10 total headache days a month. Exceleent response.    06/08/2021: Baseline 15 headache days a month and 10 migraine days a month. Excellent response, she has > 75% reduction in migraines and headaches monthlyPatient felt her forehead and eyebrows were uncomfortable due to inability to move them, reduced injections 1/2 injections in the corrigators and procerus, did the frontalis very high and was much better. 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 She also has temple pain.   Dry needling gave her a very bad migraine will not order that again. Aimovig gave her constipation and we stopped due to insurance. Doing so well doesn't even need to consider aimovig* at this time 03/16/2021: She had to put her dog down at [place] Genevie Cheshire was 16. She had a very bad migraine.  When she has a migraine we use Ubrelvy. Try getting her aimovig  09/15/2020: Baseline >15 headache days a month and >10 migraine days a month. Excellent response, she has > 75% reduction in migraines and headaches monthly. Discussed acute management prescribed frova, zembrace works quickly but the migraines rebound. Did great on Nurtec and we prescribed and she continues to take. Patient felt her forehead and eyebrows were uncomfortable due to inability to move them, reduced injections 1/2 injections in the corrigators and procerus, did the frontalis very high and was much better. 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 She also has temple pain.   Dry needling gave her a very bad migraine will not order that again. Aimovig gave  her constipation and we stopped due to insurance. Doing so well doesn't even need to consider aimovig* at this time   Reviewed orally with patient, additionally signature is on file:  Botulism toxin has been approved by the Federal drug administration for treatment of chronic migraine. Botulism toxin does not cure chronic migraine and it may not be effective in  some patients.  The administration of botulism toxin is accomplished by injecting a small amount of toxin into the muscles of the neck and head. Dosage must be titrated for each individual. Any benefits resulting from botulism toxin tend to wear off after 3 months with a repeat injection required if benefit is to be maintained. Injections are usually done every 3-4 months with maximum effect peak achieved by about 2 or 3 weeks. Botulism toxin is expensive and you should be sure of what costs you will incur resulting from the injection.  The side effects of botulism toxin use for chronic migraine may include:   -Transient, and usually mild, facial weakness with facial injections  -Transient, and usually mild, head or neck weakness with head/neck injections  -Reduction or loss of forehead facial animation due to forehead muscle weakness  -Eyelid drooping  -Dry eye  -Pain at the site of injection or bruising at the site of injection  -Double vision  -Potential unknown long term risks  Contraindications: You should not have Botox if you are pregnant, nursing, allergic to albumin, have an infection, skin condition, or muscle weakness at the site of the injection, or have myasthenia gravis, Lambert-Eaton syndrome, or ALS.  It is also possible that as with any injection, there may be an allergic reaction or no effect from the medication. Reduced effectiveness after repeated injections is sometimes seen and rarely infection at the injection site may occur. All care will be taken to prevent these side effects. If therapy is given over a long time, atrophy and wasting in the muscle injected may occur. Occasionally the patient's become refractory to treatment because they develop antibodies to the toxin. In this event, therapy needs to be modified.  I have read the above information and consent to the administration of botulism toxin.    BOTOX PROCEDURE NOTE FOR MIGRAINE HEADACHE    Contraindications  and precautions discussed with patient(above). Aseptic procedure was observed and patient tolerated procedure. Procedure performed by Dr. Artemio Aly  The condition has existed for more than 6 months, and pt does not have a diagnosis of ALS, Myasthenia Gravis or Lambert-Eaton Syndrome.  Risks and benefits of injections discussed and pt agrees to proceed with the procedure.  Written consent obtained  These injections are medically necessary. Pt  receives good benefits from these injections. These injections do not cause sedations or hallucinations which the oral therapies may cause.  Indication/Diagnosis: chronic migraine BOTOX(J0585) injection was performed according to protocol by [employer]. 200 units of BOTOX was dissolved into 4 cc NS.   NDC: [address]-82   Description of procedure:  The patient was placed in a sitting position. The standard protocol was used for Botox as follows, with 5 units of Botox injected at each site:   -Procerus muscle, midline injection 2.5 units   -Corrugator muscle, bilateral injection 2.5 units each  -Frontalis muscle, bilateral injection, with 2 sites each side, medial injection was performed in the upper one third of the frontalis muscle, in the region vertical from the medial inferior edge of the superior orbital rim. The lateral injection was again in the upper one third  of the forehead vertically above the lateral limbus of the cornea, 1.5 cm lateral to the medial injection site.  -Temporalis muscle injection, 4 sites, bilaterally. The first injection was 3 cm above the tragus of the ear, second injection site was 1.5 cm to 3 cm up from the first injection site in line with the tragus of the ear. The third injection site was 1.5-3 cm forward between the first 2 injection sites. The fourth injection site was 1.5 cm posterior to the second injection site. .  -Occipitalis muscle injection, 3 sites, bilaterally. The first injection was done one half way between  the occipital protuberance and the tip of the mastoid process behind the ear. The second injection site was done lateral and superior to the first, 1 fingerbreadth from the first injection. The third injection site was 1 fingerbreadth superiorly and medially from the first injection site.  -Cervical paraspinal muscle injection, 2 sites, bilateral knee first injection site was 1 cm from the midline of the cervical spine, 3 cm inferior to the lower border of the occipital protuberance. The second injection site was 1.5 cm superiorly and laterally to the first injection site.  -Trapezius muscle injection was performed at 3 sites, bilaterally. The first injection site was in the upper trapezius muscle halfway between the inflection point of the neck, and the acromion. The second injection site was one half way between the acromion and the first injection site. The third injection was done between the first injection site and the inflection point of the neck.   Will return for repeat injection in 3 months.   155 unit sof Botox was used, 45 Botox was wasted. The patient tolerated the procedure well, there were no complications of the above procedure.

## 2023-02-13 ENCOUNTER — Telehealth: Payer: Self-pay | Admitting: *Deleted

## 2023-02-13 NOTE — Telephone Encounter (Signed)
-----   Message from Anson Fret sent at 02/12/2023  1:09 PM EST ----- Regarding: see if w can get vyepti approved 02/09/2023:  Baseline >30 daily headaches headache days a month and >15-20 migraine days a month. Excellent response on botox, she has > 50% reduction in migraines and headache frequ and severity monthly but still with a burden of > 15 days a month and 8 migraine days a month. Will try to get Vyepti approved to layer on top of Botox.   Please try to get vyepti approved thank you

## 2023-02-13 NOTE — Telephone Encounter (Signed)
 Order signed and given to [facility].

## 2023-02-13 NOTE — Telephone Encounter (Signed)
Order form for Vyepti 100 mg IV every 3 months completed and is pending Dr Trevor Mace signature.

## 2023-02-14 ENCOUNTER — Encounter: Payer: Self-pay | Admitting: *Deleted

## 2023-02-16 ENCOUNTER — Other Ambulatory Visit (HOSPITAL_BASED_OUTPATIENT_CLINIC_OR_DEPARTMENT_OTHER): Payer: Self-pay

## 2023-02-16 MED ORDER — WEGOVY 0.5 MG/0.5ML ~~LOC~~ SOAJ
0.5000 mg | SUBCUTANEOUS | 0 refills | Status: DC
  Filled 2023-02-24: qty 2, 28d supply, fill #0

## 2023-02-20 ENCOUNTER — Other Ambulatory Visit (HOSPITAL_BASED_OUTPATIENT_CLINIC_OR_DEPARTMENT_OTHER): Payer: Self-pay

## 2023-02-24 ENCOUNTER — Other Ambulatory Visit (HOSPITAL_BASED_OUTPATIENT_CLINIC_OR_DEPARTMENT_OTHER): Payer: Self-pay

## 2023-02-27 ENCOUNTER — Other Ambulatory Visit (HOSPITAL_BASED_OUTPATIENT_CLINIC_OR_DEPARTMENT_OTHER): Payer: Self-pay

## 2023-03-09 ENCOUNTER — Other Ambulatory Visit (HOSPITAL_BASED_OUTPATIENT_CLINIC_OR_DEPARTMENT_OTHER): Payer: Self-pay

## 2023-03-09 ENCOUNTER — Telehealth: Payer: Self-pay | Admitting: Neurology

## 2023-03-09 MED ORDER — HYDROCHLOROTHIAZIDE 25 MG PO TABS
25.0000 mg | ORAL_TABLET | Freq: Every morning | ORAL | 0 refills | Status: DC
  Filled 2023-03-09: qty 90, 90d supply, fill #0

## 2023-03-09 NOTE — Telephone Encounter (Signed)
 We needed her weight and height. I had sent her a message a few weeks ago but she did not respond. Will call her.

## 2023-03-09 NOTE — Telephone Encounter (Signed)
 Checking with [facility].

## 2023-03-09 NOTE — Telephone Encounter (Signed)
 Called pt and LVM (ok per DPR) for patient with update.

## 2023-03-09 NOTE — Telephone Encounter (Signed)
 Pt said have not heard anything since prior authorization was initiated., Would like a call back.

## 2023-03-09 NOTE — Telephone Encounter (Signed)
 LVM and sent mychart msg informing pt of appt change due to MD schedule change

## 2023-03-30 ENCOUNTER — Other Ambulatory Visit (HOSPITAL_BASED_OUTPATIENT_CLINIC_OR_DEPARTMENT_OTHER): Payer: Self-pay

## 2023-03-30 MED ORDER — WEGOVY 1 MG/0.5ML ~~LOC~~ SOAJ
1.0000 mg | SUBCUTANEOUS | 0 refills | Status: DC
  Filled 2023-03-30: qty 2, 28d supply, fill #0

## 2023-04-03 NOTE — Telephone Encounter (Signed)
 I received word from [facility] that pt is unable to be infused here due to insurance and specialty pharmacy is not an option. Will need to send order elsewhere. I talked to patient. She is happy to try home infusion with [facility]. I have completed the order form and printed out other necessary requested information for the referral. Will obtain Dr Trevor Mace signature then fax to [facility].

## 2023-04-04 NOTE — Telephone Encounter (Signed)
 Vyepti 100 mg IV every 12 week referral signed by Dr Lucia Gaskins. Referral faxed to [facility] in [place] at [phone].

## 2023-04-05 NOTE — Telephone Encounter (Signed)
 [facility] faxed a note to [name] saying that referral has been received and they also confirmed they received the necessary information needed to process the request.

## 2023-04-10 ENCOUNTER — Telehealth: Payer: Self-pay | Admitting: Neurology

## 2023-04-10 NOTE — Telephone Encounter (Signed)
 Submitted auth renewal to [employer] via CMM, received approval. Pt will continue to fill through [facility].  Auth#: [id number] (04/10/23-04/09/24)

## 2023-04-13 ENCOUNTER — Ambulatory Visit (HOSPITAL_COMMUNITY)

## 2023-04-13 ENCOUNTER — Ambulatory Visit (HOSPITAL_BASED_OUTPATIENT_CLINIC_OR_DEPARTMENT_OTHER)

## 2023-04-13 DIAGNOSIS — Z5181 Encounter for therapeutic drug level monitoring: Secondary | ICD-10-CM | POA: Diagnosis not present

## 2023-04-13 NOTE — Progress Notes (Signed)
 Patient arrived today for her due labs. Patient was well groomed with a pleasant affect. Venipuncture done in the Right [facility] with a 22 G straight needle. Patient tolerated well and with out complaint.

## 2023-04-17 NOTE — Telephone Encounter (Signed)
 Received message from [place], nurse with [facility], stating Margaret Lopez has been approved and they have LVM for patient to be scheduled.

## 2023-04-18 ENCOUNTER — Telehealth (HOSPITAL_COMMUNITY): Admitting: Family

## 2023-04-18 DIAGNOSIS — F319 Bipolar disorder, unspecified: Secondary | ICD-10-CM | POA: Diagnosis not present

## 2023-04-18 DIAGNOSIS — F902 Attention-deficit hyperactivity disorder, combined type: Secondary | ICD-10-CM | POA: Diagnosis not present

## 2023-04-18 MED ORDER — GUANFACINE HCL ER 1 MG PO TB24: 1.0000 mg | ORAL_TABLET | Freq: Every day | ORAL | 0 refills | Status: DC

## 2023-04-18 NOTE — Progress Notes (Cosign Needed Addendum)
 Virtual Visit via Video Note  I connected with Margaret Lopez on 04/18/23 at  1:30 PM EDT by a video enabled telemedicine application and verified that I am speaking with the correct person using two identifiers.  Location: Patient: Home Provider: Office   I discussed the limitations of evaluation and management by telemedicine and the availability of in person appointments. The patient expressed understanding and agreed to proceed.  I discussed the assessment and treatment plan with the patient. The patient was provided an opportunity to ask questions and all were answered. The patient agreed with the plan and demonstrated an understanding of the instructions.   The patient was advised to call back or seek an in-person evaluation if the symptoms worsen or if the condition fails to improve as anticipated.  I provided 14 minutes of non-face-to-face time during this encounter.   Margaret Rack, NP   BH MD/PA/NP OP Progress Note  04/18/2023 2:07 PM Margaret Lopez  MRN:  [id number]  Chief Complaint: Medication management  HPI: [name] 53 year old female presents for medication management follow-up appointment.  Patient was seen and evaluated via caregility. She is currently prescribed Depakote 1500 mg daily, lithium 600 mg nightly.  Reports she has been taking and tolerating medications well.  Continues to struggle with attention and focus.  States that she feels her depression is getting worse.  Recently completed valproic acid level 61, 5 days ago lithium level 1.2.  CMP CBC within normal limits.  Discussed by elevation with glucose level.  Awaiting vitamin D results as she reports feeling " tired".  Recently discontinued Adderall, states she feels her mood is starting to stabilize from taking medication.  -Discussed following up with primary care related to thyroid panel results -TSH 4.990 Free T4/t3 within normal limits.   Consideration for adjunct medication for worsening depression  symptoms, however patient states she would like to she if her mood stabilizes since she recently discontinued Adderall a few months ago.    Discussed initiating Intuniv 1 mg nonstimulant for attention deficit disorder combined inattentive.  She was receptive to plan.  No other documented concerns noted at this visit.  Follow-up 1 months for medication management.   Visit Diagnosis:    ICD-10-CM   1. Bipolar 1 disorder, depressed (HCC)  F31.9     2. Attention deficit hyperactivity disorder, combined type, moderate  F90.2       Past Psychiatric History:   Past Medical History:  Past Medical History:  Diagnosis Date   Anxiety    Asthma    Bipolar disorder (HCC)    Depression    Deviated nasal septum 02/2011   Generalized anxiety disorder 04/16/2013   GERD (gastroesophageal reflux disease)    Headache(784.0)    migraines   Insomnia    Nasal turbinate hypertrophy 02/2011   bilat.   Neuromuscular disorder (HCC)    Dr. Judie Petit. [facility]- does injections around nerve in her head for prevention of migraines - q 3 months    Pneumonia    hx of    PONV (postoperative nausea and vomiting)    Seasonal allergies     Past Surgical History:  Procedure Laterality Date   ABDOMINAL HYSTERECTOMY     CARPAL TUNNEL RELEASE Right    DIAGNOSTIC LAPAROSCOPY     EXCISION MASS LOWER EXTREMETIES Left 06/21/2018   Procedure: Excision of left upper leg fat necrosis;  Surgeon: Peggye Form, DO;  Location: [facility];  Service: Plastics;  Laterality: Left;  FOOT SURGERY Right    bone removed from great toe    GASTRIC ROUX-EN-Y N/A 05/16/2016   Procedure: LAPAROSCOPIC ROUX-EN-Y GASTRIC BYPASS WITH UPPER ENDOSCOPY;  Surgeon: Berna Bue, MD;  Location: [facility];  Service: General;  Laterality: N/A;   LAPAROSCOPIC VAGINAL HYSTERECTOMY  03/14/2007   LIPOMA EXCISION Left 08/26/2016   Procedure: EXCISION LIPOMA FROM LEFT UPPER POSTERIOR THIGH;  Surgeon: Berna Bue, MD;  Location: [facility];   Service: General;  Laterality: Left;   MYRINGOTOMY WITH TUBE PLACEMENT Bilateral 03/02/2015   Procedure: MYRINGOTOMY WITH T-TUBE PLACEMENT;  Surgeon: Newman Pies, MD;  Location: [facility];  Service: ENT;  Laterality: Bilateral;   NASAL SEPTOPLASTY W/ TURBINOPLASTY  02/22/2011   Procedure: NASAL SEPTOPLASTY WITH TURBINATE REDUCTION;  Surgeon: Darletta Moll, MD;  Location: [facility];  Service: ENT;  Laterality: Bilateral;   TUBAL LIGATION  12/16/2004   TYMPANOSTOMY TUBE PLACEMENT Bilateral 07/21/2017    Family Psychiatric History:   Family History:  Family History  Problem Relation Age of Onset   ADD / ADHD Sister    Hypertension Mother    Sarcoidosis Mother    Rheum arthritis Mother    Hypertension Father    Heart Problems Father    Alcohol abuse Maternal Grandmother    Bipolar disorder Maternal Grandmother    Breast cancer Maternal Grandmother    Breast cancer Maternal Aunt    Heart Problems Other        dad's side of family   Suicidality Neg Hx     Social History:  Social History   Socioeconomic History   Marital status: Legally Separated    Spouse name: Not on file   Number of children: 2   Years of education: Not on file   Highest education level: Some college, no degree  Occupational History   Not on file  Tobacco Use   Smoking status: Former    Current packs/day: 0.00    Average packs/day: 0.5 packs/day for 4.0 years (2.0 ttl pk-yrs)    Types: Cigarettes    Start date: 11/12/2011    Quit date: 11/12/2015    Years since quitting: 7.4   Smokeless tobacco: Never  Vaping Use   Vaping status: Never Used  Substance and Sexual Activity   Alcohol use: No    Alcohol/week: 0.0 standard drinks of alcohol    Comment: once a month or less   Drug use: No   Sexual activity: Yes    Partners: Male    Birth control/protection: Surgical    Comment: Hysterectomy  Other Topics Concern   Not on file  Social History Narrative   Lives at home with  fiance', his son, and her children come and go from the home (64 y.o. and 51 y.o.)   Right handed   Caffeine: 2 cups daily   Social Drivers of Health   Financial Resource Strain: Not on file  Food Insecurity: Not on file  Transportation Needs: Not on file  Physical Activity: Sufficiently Active (11/17/2016)   Exercise Vital Sign    Days of Exercise per Week: 5 days    Minutes of Exercise per Session: 40 min  Stress: No Stress Concern Present (11/17/2016)   [employer] of Occupational Health - Occupational Stress Questionnaire    Feeling of Stress : Only a little  Social Connections: Not on file    Allergies:  Allergies  Allergen Reactions   Doxazosin Nausea And Vomiting   Morphine And Codeine  Itching   Phenergan [Promethazine Hcl]     " knocks out for several hours"   Aloe Hives   Penicillins Rash    Has patient had a PCN reaction causing immediate rash, facial/tongue/throat swelling, SOB or lightheadedness with hypotension: No Has patient had a PCN reaction causing severe rash involving mucus membranes or skin necrosis: No Has patient had a PCN reaction that required hospitalization No Has patient had a PCN reaction occurring within the last 10 years: No If all of the above answers are "NO", then may proceed with Cephalosporin use.    Sulfa Antibiotics Rash    Metabolic Disorder Labs: No results found for: "HGBA1C", "MPG" No results found for: "PROLACTIN" No results found for: "CHOL", "TRIG", "HDL", "CHOLHDL", "VLDL", "LDLCALC" Lab Results  Component Value Date   TSH 4.990 (H) 04/13/2023   TSH 4.610 (H) 05/31/2022    Therapeutic Level Labs: Lab Results  Component Value Date   LITHIUM 1.2 04/13/2023   LITHIUM 1.0 05/31/2022   Lab Results  Component Value Date   VALPROATE 61 04/13/2023   VALPROATE 91 05/31/2022   No results found for: "CBMZ"  Current Medications: Current Outpatient Medications  Medication Sig Dispense Refill   guanFACINE (INTUNIV) 1  MG TB24 ER tablet Take 1 tablet (1 mg total) by mouth daily. 30 tablet 0   albuterol (PROVENTIL HFA;VENTOLIN HFA) 108 (90 Base) MCG/ACT inhaler Inhale 1-2 puffs into the lungs every 6 (six) hours as needed for wheezing or shortness of breath.     botulinum toxin Type A (BOTOX) 100 units SOLR injection Provider to inject 155 units into the muscles of the head and neck every 3 months. Discard remainder. 1 each 3   botulinum toxin Type A (BOTOX) 100 units SOLR injection Inject 155 units into head and neck muscles, discard remainder. 2 each 2   cyclobenzaprine (FLEXERIL) 10 MG tablet TAKE 1/2 TO 1 TABLET(5 TO 10 MG) BY MOUTH AT BEDTIME AS NEEDED 90 tablet 4   divalproex (DEPAKOTE ER) 500 MG 24 hr tablet Take 3 tablets (1,500 mg total) by mouth daily. 180 tablet 0   famotidine (PEPCID) 20 MG tablet Take 1 tablet (20 mg total) by mouth 2 (two) times daily. 60 tablet 0   gabapentin (NEURONTIN) 100 MG capsule Take 1 capsule (100 mg total) by mouth 3 (three) times daily as needed. 90 capsule 6   hydrochlorothiazide (HYDRODIURIL) 25 MG tablet Take 1 tablet (25 mg total) by mouth every morning. 90 tablet 0   hydrOXYzine (ATARAX/VISTARIL) 25 MG tablet Take 25 mg by mouth at bedtime as needed for itching.  3   ipratropium (ATROVENT) 0.06 % nasal spray Place 2 sprays into both nostrils daily as needed. Allergies, runny nose.  4   ketorolac (TORADOL) 60 MG/2ML SOLN injection Inject 1-77ml (30-60mg ) intramuscularly at onset of migraine. May repeat in 6 hours. Max twice a day and 4 days per month. 10 mL 4   levocetirizine (XYZAL) 5 MG tablet Take 5 mg by mouth at bedtime.      lithium carbonate (LITHOBID) 300 MG ER tablet Take 2 tablets (600 mg total) by mouth every evening. 120 tablet 0   methylPREDNISolone (MEDROL DOSEPAK) 4 MG TBPK tablet Take pills daily all together with food. Take the first dose (6 pills) as soon as possible. Take the rest each morning. For 6 days total 6-5-4-3-2-1. 21 tablet 1    methylPREDNISolone (MEDROL DOSEPAK) 4 MG TBPK tablet TAKE AS DIRECTED WITH FOOD 21 each 0  Multiple Vitamins-Minerals (OPURITY BYPASS OPTIMIZED) CHEW Chew 1 tablet by mouth daily.     naratriptan (AMERGE) 2.5 MG tablet TAKE 1 TABLET BY MOUTH FOR HEADACHE OR MIGRAINE. DO NOT EXCEED 2 DOSES OF ANY TRIPTAN IN 1 DAY 10 tablet 6   ondansetron (ZOFRAN-ODT) 8 MG disintegrating tablet Take 1 tablet (8 mg total) by mouth every 8 (eight) hours as needed for nausea or vomiting. 30 tablet 11   prochlorperazine (COMPAZINE) 10 MG tablet Take 1 tablet (10 mg total) by mouth 3 (three) times daily as needed for nausea or vomiting (migraine). MAY CAUSE SEDATION. 30 tablet 3   Rimegepant Sulfate (NURTEC) 75 MG TBDP Take 1 tablet (75 mg total) by mouth daily as needed. For migraines. Take as close to onset of migraine as possible. One daily maximum. 16 tablet 11   Semaglutide-Weight Management (WEGOVY) 0.5 MG/0.5ML SOAJ Inject 0.5 mg into the skin once a week. 2 mL 0   Semaglutide-Weight Management (WEGOVY) 1 MG/0.5ML SOAJ Inject 1 mg into the skin once a week. 2 mL 0   sodium chloride 0.9 % SOLN 100 mL with Eptinezumab-jjmr 100 MG/ML SOLN 100 mg Inject 100 mg into the vein every 3 (three) months. 100 mg 4   SUMAtriptan (TOSYMRA) 10 MG/ACT SOLN Place 10 mg into the nose every hour as needed. 2 each 0   SUMAtriptan Succinate (ZEMBRACE SYMTOUCH) 3 MG/0.5ML SOAJ Inject 3 mg into the skin every 1 hour as needed for migraine. Max 4 injections in one day. 12 mL 11   SYRINGE-NEEDLE, DISP, 3 ML (BD INTEGRA SYRINGE) 25G X 1" 3 ML MISC ATTACH NEEDLE TO SYRINGE AND USE TO DRAW UP AND ADMINISTER TORADOL. DO NOT REUSE. 4 each 0   valACYclovir (VALTREX) 500 MG tablet Take 500 mg by mouth See admin instructions. Takes 500mg  daily for 5 days when she has a flare up.  3   Zavegepant HCl (ZAVZPRET) 10 MG/ACT SOLN Place 1 spray into the nose daily as needed. PATIENT HAS COPAY CARD WILL BRING 6 each 11   Current Facility-Administered  Medications  Medication Dose Route Frequency Provider Last Rate Last Admin   botulinum toxin Type A (BOTOX) injection 100 Units  100 Units Intramuscular Once Anson Fret, MD       botulinum toxin Type A (BOTOX) injection 155 Units  155 Units Intramuscular Once Anson Fret, MD         Musculoskeletal: Virtual assessment  Psychiatric Specialty Exam: Review of Systems  There were no vitals taken for this visit.There is no height or weight on file to calculate BMI.  General Appearance: Casual  Eye Contact:  Good  Speech:  Clear and Coherent  Volume:  Normal  Mood:  Anxious  Affect:  Congruent  Thought Process:  Coherent  Orientation:  Full (Time, Place, and Person)  Thought Content: WDL   Suicidal Thoughts:  No  Homicidal Thoughts:  No  Memory:  Immediate;   Good Recent;   Good  Judgement:  Good  Insight:  Good  Psychomotor Activity:  Normal  Concentration:  Concentration: Good  Recall:  Good  Fund of Knowledge: Good  Language: Good  Akathisia:  No  Handed:  Right  AIMS (if indicated): not done  Assets:  Communication Skills Desire for Improvement  ADL's:  Intact  Cognition: WNL  Sleep:  Good   Screenings: PHQ2-9    Flowsheet Row Office Visit from 10/17/20[facility] in [facility] Video Visit from [facility]2022 [facility] Video Visit from 10/14/2021 in [facility] Video Visit from 07[facility]15/2021 in [facility] Video Visit from 04[facility]29/2021 in [facility]  PHQ-2 Total Score 3 0 0 0 2  PHQ-9 Total Score 18 -- -- -- 3      Flowsheet Row Video Visit from 03/31/2022 in [facility] Video Visit from 10/14/2021 in [facility] Video Visit from 07/15/2021 in [facility]  C-SSRS  RISK CATEGORY No Risk No Risk No Risk        Assessment and Plan: Margaret Lopez 53 year old female presents for medication management follow-up appointment.  Margaret Lopez carries a diagnosis related to bipolar, depression disorder.  She reports she has been taking and tolerating medications well.  Recently discontinued Adderall as she reports increased anxiety, mood irritability and sleep disturbance.  While on medication.  Reports she is interested in restarting something to help with concentration and focus.  Tried Strattera in the past without success.  Will try Intuniv 1 mg daily.  Will continue to monitor symptoms.  Patient to follow-up 1 month  Collaboration of Care: Collaboration of Care: Medication Management AEB initiated Intuniv 1 mg  Patient/Guardian was advised Release of Information must be obtained prior to any record release in order to collaborate their care with an outside provider. Patient/Guardian was advised if they have not already done so to contact the registration department to sign all necessary forms in order for [name] to release information regarding their care.   Consent: Patient/Guardian gives verbal consent for treatment and assignment of benefits for services provided during this visit. Patient/Guardian expressed understanding and agreed to proceed.    Margaret Rack, NP 04/18/2023, 2:07 PM

## 2023-04-19 NOTE — Telephone Encounter (Signed)
 Received fax from [facility] stating pt's first infusion is scheduled for 04/27/23. Also, her current authorization expires 10/07/23.

## 2023-04-20 ENCOUNTER — Other Ambulatory Visit (HOSPITAL_BASED_OUTPATIENT_CLINIC_OR_DEPARTMENT_OTHER): Payer: Self-pay

## 2023-04-20 MED ORDER — FAMOTIDINE 20 MG PO TABS
20.0000 mg | ORAL_TABLET | Freq: Two times a day (BID) | ORAL | 1 refills | Status: DC
  Filled 2023-04-20 – 2023-05-03 (×2): qty 90, 45d supply, fill #0
  Filled 2023-07-12: qty 90, 45d supply, fill #1

## 2023-04-24 ENCOUNTER — Other Ambulatory Visit: Payer: Self-pay

## 2023-04-24 NOTE — Progress Notes (Signed)
 Specialty Pharmacy Refill Coordination Note  Margaret Lopez is a 53 y.o. female contacted today regarding refills of specialty medication(s) OnabotulinumtoxinA (BOTOX)   Patient requested Courier to Provider Office   Delivery date: 05/04/23   Verified address: [facility]  [address]   Medication will be filled on 05/03/23.

## 2023-04-27 ENCOUNTER — Encounter: Payer: Self-pay | Admitting: Neurology

## 2023-04-27 LAB — CBC WITH DIFFERENTIAL/PLATELET
Basophils Absolute: 0 10*3/uL (ref 0.0–0.2)
Basos: 0 %
EOS (ABSOLUTE): 0.1 10*3/uL (ref 0.0–0.4)
Eos: 2 %
Hematocrit: 38 % (ref 34.0–46.6)
Hemoglobin: 12.5 g/dL (ref 11.1–15.9)
Immature Grans (Abs): 0 10*3/uL (ref 0.0–0.1)
Immature Granulocytes: 1 %
Lymphocytes Absolute: 1.5 10*3/uL (ref 0.7–3.1)
Lymphs: 26 %
MCH: 31.6 pg (ref 26.6–33.0)
MCHC: 32.9 g/dL (ref 31.5–35.7)
MCV: 96 fL (ref 79–97)
Monocytes Absolute: 0.4 10*3/uL (ref 0.1–0.9)
Monocytes: 7 %
Neutrophils Absolute: 3.8 10*3/uL (ref 1.4–7.0)
Neutrophils: 64 %
Platelets: 265 10*3/uL (ref 150–450)
RBC: 3.96 x10E6/uL (ref 3.77–5.28)
RDW: 11.8 % (ref 11.7–15.4)
WBC: 5.8 10*3/uL (ref 3.4–10.8)

## 2023-04-27 LAB — VALPROIC ACID LEVEL: Valproic Acid Lvl: 61 ug/mL (ref 50–100)

## 2023-04-27 LAB — COMPREHENSIVE METABOLIC PANEL WITH GFR
ALT: 5 IU/L (ref 0–32)
AST: 14 IU/L (ref 0–40)
Albumin: 4.2 g/dL (ref 3.9–4.9)
Alkaline Phosphatase: 90 IU/L (ref 44–121)
BUN/Creatinine Ratio: 13 (ref 9–23)
BUN: 12 mg/dL (ref 6–24)
Bilirubin Total: 0.2 mg/dL (ref 0.0–1.2)
CO2: 26 mmol/L (ref 20–29)
Calcium: 9.7 mg/dL (ref 8.7–10.2)
Chloride: 98 mmol/L (ref 96–106)
Creatinine, Ser: 0.93 mg/dL (ref 0.57–1.00)
Globulin, Total: 1.9 g/dL (ref 1.5–4.5)
Glucose: 150 mg/dL — ABNORMAL HIGH (ref 70–99)
Potassium: 4.4 mmol/L (ref 3.5–5.2)
Sodium: 138 mmol/L (ref 134–144)
Total Protein: 6.1 g/dL (ref 6.0–8.5)
eGFR: 75 mL/min/{1.73_m2} (ref >59)

## 2023-04-27 LAB — VITAMIN D 1,25 DIHYDROXY
Vitamin D 1, 25 (OH)2 Total: 39 pg/mL
Vitamin D2 1, 25 (OH)2: <10 pg/mL
Vitamin D3 1, 25 (OH)2: 31 pg/mL

## 2023-04-27 LAB — TSH+FREE T4
Free T4: 0.88 ng/dL (ref 0.82–1.77)
TSH: 4.99 u[IU]/mL — ABNORMAL HIGH (ref 0.450–4.500)

## 2023-04-27 LAB — LITHIUM LEVEL: Lithium Lvl: 1.2 mmol/L (ref 0.5–1.2)

## 2023-05-01 NOTE — Telephone Encounter (Signed)
-  Received Nursing assessment on Vyepti 100mg  infusion 1st infusion 04-27-2023 (from [facility]).   [employer]/ Elicia Ground, RN pt tolerated infusion well.

## 2023-05-03 ENCOUNTER — Other Ambulatory Visit: Payer: Self-pay

## 2023-05-03 ENCOUNTER — Other Ambulatory Visit (HOSPITAL_BASED_OUTPATIENT_CLINIC_OR_DEPARTMENT_OTHER): Payer: Self-pay

## 2023-05-04 ENCOUNTER — Ambulatory Visit: Payer: Medicaid Other | Admitting: Neurology

## 2023-05-08 ENCOUNTER — Ambulatory Visit: Payer: Medicaid Other | Admitting: Neurology

## 2023-05-08 DIAGNOSIS — G43709 Chronic migraine without aura, not intractable, without status migrainosus: Secondary | ICD-10-CM | POA: Diagnosis not present

## 2023-05-08 MED ORDER — ONABOTULINUMTOXINA 100 UNITS IJ SOLR
100.0000 [IU] | Freq: Once | INTRAMUSCULAR | Status: AC
  Administered 2023-05-08: 155 [IU] via INTRAMUSCULAR

## 2023-05-08 NOTE — Progress Notes (Signed)
 Consent Form Botulism Toxin Injection For Chronic Migraine 05/08/2023: First infusion Vyepti 100mg  infusion 1st infusion 04-27-2023. Patient feels that her migraines cause her jaw to ache in her jaw aching also can cause her migraines to worsen it is a trigger for migraines included units in each masseter to see if that helps with migraine severity.   02/09/2023:  Baseline >30 daily headaches headache days a month and >15-20 migraine days a month. Excellent response, she has > 50% reduction in migraines and headache frequ and severity monthly but still with a burden of > 15 days a month and 8 migraine days a month. Will try to get Vyepti approved to layer on top of Botox .    11/19/2022:  Baseline >30 daily headaches headache days a month and >15-20 migraine days a month. Excellent response, she has > 50% reduction in migraines and headache frequ and severity monthly. Discussed acute management prescribed frova , zembrace works quickly but the migraines rebound. Did great on Nurtec and we prescribed and she continues to take(ubrelvy  does not work as well). Patient felt her forehead and eyebrows were uncomfortable due to inability to move them, reduced injections 1/2 injections in the corrigators and procerus, did the frontalis very high and was much better. She feels clenchings in her masseters and pterygoids make her migraines worse has tried muscle relaxer at bedtime will put 10 units in each masseter for follow the pain. She also has temple pain.   Dry needling gave her a very bad migraine will not order that again. A  No orders of the defined types were placed in this encounter. 08/18/2022:stable 02/22/2022: Stable  12/05/2021: She is doing excellent. On Botox  migraines have been reduced to 4 migraine days a month and <10 total headache days a month. Exceleent response.  For migraines that come on quickly will prescribe zavzpret .  09/07/2021: On Botox  migraines have been reduced to 4 migraine days a  month and <10 total headache days a month. Exceleent response.   06/08/2021: Baseline 15 headache days a month and 10 migraine days a month. Excellent response, she has > 75% reduction in migraines and headaches monthlyPatient felt her forehead and eyebrows were uncomfortable due to inability to move them, reduced injections 1/2 injections in the corrigators and procerus, did the frontalis very high and was much better. 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 She also has temple pain.   Dry needling gave her a very bad migraine will not order that again. Aimovig  gave her constipation and we stopped due to insurance. Doing so well doesn't even need to consider aimovig * at this time 03/16/2021: She had to put her dog down at [name] was 16. She had a very bad migraine.  When she has a migraine we use Ubrelvy . Try getting her aimovig   09/15/2020: Baseline >15 headache days a month and >10 migraine days a month. Excellent response, she has > 75% reduction in migraines and headaches monthly. Discussed acute management prescribed frova , zembrace works quickly but the migraines rebound. Did great on Nurtec and we prescribed and she continues to take. Patient felt her forehead and eyebrows were uncomfortable due to inability to move them, reduced injections 1/2 injections in the corrigators and procerus, did the frontalis very high and was much better. 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 She also has temple pain.   Dry needling gave her a very bad  migraine will not order that again. Aimovig  gave her constipation and we stopped due to insurance. Doing so well doesn't even need to consider aimovig * at this time   Reviewed orally with patient, additionally signature is on file:  Botulism toxin has been approved by the Federal drug administration for treatment of chronic migraine. Botulism toxin  does not cure chronic migraine and it may not be effective in some patients.  The administration of botulism toxin is accomplished by injecting a small amount of toxin into the muscles of the neck and head. Dosage must be titrated for each individual. Any benefits resulting from botulism toxin tend to wear off after 3 months with a repeat injection required if benefit is to be maintained. Injections are usually done every 3-4 months with maximum effect peak achieved by about 2 or 3 weeks. Botulism toxin is expensive and you should be sure of what costs you will incur resulting from the injection.  The side effects of botulism toxin use for chronic migraine may include:   -Transient, and usually mild, facial weakness with facial injections  -Transient, and usually mild, head or neck weakness with head/neck injections  -Reduction or loss of forehead facial animation due to forehead muscle weakness  -Eyelid drooping  -Dry eye  -Pain at the site of injection or bruising at the site of injection  -Double vision  -Potential unknown long term risks  Contraindications: You should not have Botox  if you are pregnant, nursing, allergic to albumin, have an infection, skin condition, or muscle weakness at the site of the injection, or have myasthenia gravis, Lambert-Eaton syndrome, or ALS.  It is also possible that as with any injection, there may be an allergic reaction or no effect from the medication. Reduced effectiveness after repeated injections is sometimes seen and rarely infection at the injection site may occur. All care will be taken to prevent these side effects. If therapy is given over a long time, atrophy and wasting in the muscle injected may occur. Occasionally the patient's become refractory to treatment because they develop antibodies to the toxin. In this event, therapy needs to be modified.  I have read the above information and consent to the administration of botulism toxin.    BOTOX   PROCEDURE NOTE FOR MIGRAINE HEADACHE    Contraindications and precautions discussed with patient(above). Aseptic procedure was observed and patient tolerated procedure. Procedure performed by Dr. Criselda Dolly  The condition has existed for more than 6 months, and pt does not have a diagnosis of ALS, Myasthenia Gravis or Lambert-Eaton Syndrome.  Risks and benefits of injections discussed and pt agrees to proceed with the procedure.  Written consent obtained  These injections are medically necessary. Pt  receives good benefits from these injections. These injections do not cause sedations or hallucinations which the oral therapies may cause.  Indication/Diagnosis: chronic migraine BOTOX (Q4696) injection was performed according to protocol by [facility]. 200 units of BOTOX  was dissolved into 4 cc NS.   NDC: [address]-44   Description of procedure:  The patient was placed in a sitting position. The standard protocol was used for Botox  as follows, with 5 units of Botox  injected at each site:   -Procerus muscle, midline injection 2.5 units   -Corrugator muscle, bilateral injection 2.5 units each  -Frontalis muscle, bilateral injection, with 2 sites each side, medial injection was performed in the upper one third of the frontalis muscle, in the region vertical from the medial inferior edge of the superior orbital rim. The lateral  injection was again in the upper one third of the forehead vertically above the lateral limbus of the cornea, 1.5 cm lateral to the medial injection site.  -Temporalis muscle injection, 4 sites, bilaterally. The first injection was 3 cm above the tragus of the ear, second injection site was 1.5 cm to 3 cm up from the first injection site in line with the tragus of the ear. The third injection site was 1.5-3 cm forward between the first 2 injection sites. The fourth injection site was 1.5 cm posterior to the second injection site. .  -Occipitalis muscle injection, 3 sites,  bilaterally. The first injection was done one half way between the occipital protuberance and the tip of the mastoid process behind the ear. The second injection site was done lateral and superior to the first, 1 fingerbreadth from the first injection. The third injection site was 1 fingerbreadth superiorly and medially from the first injection site.  -Cervical paraspinal muscle injection, 2 sites, bilateral knee first injection site was 1 cm from the midline of the cervical spine, 3 cm inferior to the lower border of the occipital protuberance. The second injection site was 1.5 cm superiorly and laterally to the first injection site.  -Trapezius muscle injection was performed at 3 sites, bilaterally. The first injection site was in the upper trapezius muscle halfway between the inflection point of the neck, and the acromion. The second injection site was one half way between the acromion and the first injection site. The third injection was done between the first injection site and the inflection point of the neck.   Will return for repeat injection in 3 months.   155 unit sof Botox  was used, 45 Botox  was wasted. The patient tolerated the procedure well, there were no complications of the above procedure.

## 2023-05-08 NOTE — Progress Notes (Signed)
 Botox - 100 units x 1 vial Lot: [id number] Expiration: 06/2025 NDC: 4540-9811-91  Bacteriostatic 0.9% Sodium Chloride - 4mL total Lot: [id number] Expiration: 11/11/2023 NDC: 6213-0865-78  Dx: I69.629 S/P   Botox - 100 units x 1 vial Lot: [id number] Expiration: 10/2025 NDC: 4010-2725-36  Bacteriostatic 0.9% Sodium Chloride - 4mL total Lot: [id number] Expiration: 11/11/2023 NDC: 7425-9563-87  Dx: F64.332 S/P  Witnessed By: Inocencio Mania, RN

## 2023-05-10 ENCOUNTER — Telehealth: Payer: Self-pay

## 2023-05-10 ENCOUNTER — Other Ambulatory Visit (HOSPITAL_COMMUNITY): Payer: Self-pay

## 2023-05-10 NOTE — Telephone Encounter (Signed)
 Pharmacy Patient Advocate Encounter  Received notification from [facility] that Prior Authorization for Zavzpret  10MG /ACT solution has been APPROVED from 05/10/2023 to 05/09/2024. Ran test claim, Copay is $4.00. This test claim was processed through [facility]- copay amounts may vary at other pharmacies due to pharmacy/plan contracts, or as the patient moves through the different stages of their insurance plan.   PA #/Case ID/Reference #: PA Case ID #: [id number]

## 2023-05-10 NOTE — Telephone Encounter (Signed)
 Pharmacy Patient Advocate Encounter   Received notification from CoverMyMeds that prior authorization for Zavzpret  10MG /ACT solution is required/requested.   Insurance verification completed.   The patient is insured through [facility] .   Per test claim: PA required; PA submitted to above mentioned insurance via CoverMyMeds Key/confirmation #/EOC [id number] Status is pending

## 2023-05-11 ENCOUNTER — Other Ambulatory Visit (HOSPITAL_BASED_OUTPATIENT_CLINIC_OR_DEPARTMENT_OTHER): Payer: Self-pay

## 2023-05-11 MED ORDER — WEGOVY 1 MG/0.5ML ~~LOC~~ SOAJ
1.0000 mg | SUBCUTANEOUS | 0 refills | Status: DC
  Filled 2023-05-11: qty 2, 28d supply, fill #0

## 2023-05-16 ENCOUNTER — Other Ambulatory Visit (HOSPITAL_BASED_OUTPATIENT_CLINIC_OR_DEPARTMENT_OTHER): Payer: Self-pay

## 2023-05-17 ENCOUNTER — Other Ambulatory Visit (HOSPITAL_BASED_OUTPATIENT_CLINIC_OR_DEPARTMENT_OTHER): Payer: Self-pay

## 2023-05-22 ENCOUNTER — Telehealth (HOSPITAL_BASED_OUTPATIENT_CLINIC_OR_DEPARTMENT_OTHER): Admitting: Family

## 2023-05-22 ENCOUNTER — Other Ambulatory Visit (HOSPITAL_BASED_OUTPATIENT_CLINIC_OR_DEPARTMENT_OTHER): Payer: Self-pay

## 2023-05-22 DIAGNOSIS — F902 Attention-deficit hyperactivity disorder, combined type: Secondary | ICD-10-CM | POA: Diagnosis not present

## 2023-05-22 DIAGNOSIS — F319 Bipolar disorder, unspecified: Secondary | ICD-10-CM | POA: Diagnosis not present

## 2023-05-22 NOTE — Progress Notes (Unsigned)
 Virtual Visit via Video Note  I connected with Margaret Lopez on 05/22/23 at  3:30 PM EDT by a video enabled telemedicine application and verified that I am speaking with the correct person using two identifiers.  Location: Patient: Home Provider: Office   I discussed the limitations of evaluation and management by telemedicine and the availability of in person appointments. The patient expressed understanding and agreed to proceed.  I discussed the assessment and treatment plan with the patient. The patient was provided an opportunity to ask questions and all were answered. The patient agreed with the plan and demonstrated an understanding of the instructions.   The patient was advised to call back or seek an in-person evaluation if the symptoms worsen or if the condition fails to improve as anticipated.  I provided 15  minutes of non-face-to-face time during this encounter.   Levester Reagin, NP   [facility] MD/PA/NP OP Progress Note  05/22/2023 3:59 PM LIESELOTTE HESSELTINE  MRN:  [id number]  Chief Complaint: Medication management  HPI: Margaret Lopez 53 year old female presents for medication management follow-up appointment.  Carries a diagnosis related to bipolar 1, major depressive disorder, generalized anxiety disorder and attention deficit disorder inattentive type.  She was initiated on Intuniv  1 mg nightly for concentration and focus.  As we have tried multiple medications in the past for concentration and focus to include Strattera  and Adderall.  Discussed nonstimulant options.  However states she has not taken medications due to her primary care provider initiated low dose of Synthroid at her most recent follow-up visit.  Discussed following up late September for medication management.  She was receptive to plan.  Patient observed resting on bed.  Speaking in clear congruent moderate tone.  Normal place good eye contact.  Thoughts coherent and relevant.  No indication that patient is responding  to internal or external stimuli.  Continues to take lithium  and Depakote  as directed.  Mood is congruent.  She remained calm throughout this assessment.  Reports a fair appetite.  States she is resting well throughout the night.  Support, encouragement and reassurance was provided.   Visit Diagnosis:    ICD-10-CM   1. Bipolar 1 disorder, depressed (HCC)  F31.9     2. Attention deficit hyperactivity disorder, combined type, moderate  F90.2       Past Psychiatric History:   Past Medical History:  Past Medical History:  Diagnosis Date   Anxiety    Asthma    Bipolar disorder (HCC)    Depression    Deviated nasal septum 02/2011   Generalized anxiety disorder 04/16/2013   GERD (gastroesophageal reflux disease)    Headache(784.0)    migraines   Insomnia    Nasal turbinate hypertrophy 02/2011   bilat.   Neuromuscular disorder (HCC)    Dr. Melven Stable. Margie Sheller- does injections around nerve in her head for prevention of migraines - q 3 months    Pneumonia    hx of    PONV (postoperative nausea and vomiting)    Seasonal allergies     Past Surgical History:  Procedure Laterality Date   ABDOMINAL HYSTERECTOMY     CARPAL TUNNEL RELEASE Right    DIAGNOSTIC LAPAROSCOPY     EXCISION MASS LOWER EXTREMETIES Left 06/21/2018   Procedure: Excision of left upper leg fat necrosis;  Surgeon: Thornell Flirt, DO;  Location: [facility];  Service: Plastics;  Laterality: Left;   FOOT SURGERY Right    bone removed from great toe  GASTRIC ROUX-EN-Y N/A 05/16/2016   Procedure: LAPAROSCOPIC ROUX-EN-Y GASTRIC BYPASS WITH UPPER ENDOSCOPY;  Surgeon: Adalberto Acton, MD;  Location: [facility];  Service: General;  Laterality: N/A;   LAPAROSCOPIC VAGINAL HYSTERECTOMY  03/14/2007   LIPOMA EXCISION Left 08/26/2016   Procedure: EXCISION LIPOMA FROM LEFT UPPER POSTERIOR THIGH;  Surgeon: Adalberto Acton, MD;  Location: [facility];  Service: General;  Laterality: Left;   MYRINGOTOMY WITH TUBE PLACEMENT Bilateral  03/02/2015   Procedure: MYRINGOTOMY WITH T-TUBE PLACEMENT;  Surgeon: Reynold Caves, MD;  Location: [facility];  Service: ENT;  Laterality: Bilateral;   NASAL SEPTOPLASTY W/ TURBINOPLASTY  02/22/2011   Procedure: NASAL SEPTOPLASTY WITH TURBINATE REDUCTION;  Surgeon: Lawence Press, MD;  Location: [facility];  Service: ENT;  Laterality: Bilateral;   TUBAL LIGATION  12/16/2004   TYMPANOSTOMY TUBE PLACEMENT Bilateral 07/21/2017    Family Psychiatric History:   Family History:  Family History  Problem Relation Age of Onset   ADD / ADHD Sister    Hypertension Mother    Sarcoidosis Mother    Rheum arthritis Mother    Hypertension Father    Heart Problems Father    Alcohol abuse Maternal Grandmother    Bipolar disorder Maternal Grandmother    Breast cancer Maternal Grandmother    Breast cancer Maternal Aunt    Heart Problems Other        dad's side of family   Suicidality Neg Hx     Social History:  Social History   Socioeconomic History   Marital status: Legally Separated    Spouse name: Not on file   Number of children: 2   Years of education: Not on file   Highest education level: Some college, no degree  Occupational History   Not on file  Tobacco Use   Smoking status: Former    Current packs/day: 0.00    Average packs/day: 0.5 packs/day for 4.0 years (2.0 ttl pk-yrs)    Types: Cigarettes    Start date: 11/12/2011    Quit date: 11/12/2015    Years since quitting: 7.5   Smokeless tobacco: Never  Vaping Use   Vaping status: Never Used  Substance and Sexual Activity   Alcohol use: No    Alcohol/week: 0.0 standard drinks of alcohol    Comment: once a month or less   Drug use: No   Sexual activity: Yes    Partners: Male    Birth control/protection: Surgical    Comment: Hysterectomy  Other Topics Concern   Not on file  Social History Narrative   Lives at home with fiance', his son, and her children come and go from the home (65 y.o. and 51 y.o.)    Right handed   Caffeine: 2 cups daily   Social Drivers of Health   Financial Resource Strain: Not on file  Food Insecurity: Not on file  Transportation Needs: Not on file  Physical Activity: Sufficiently Active (11/17/2016)   Exercise Vital Sign    Days of Exercise per Week: 5 days    Minutes of Exercise per Session: 40 min  Stress: No Stress Concern Present (11/17/2016)   [employer] of Occupational Health - Occupational Stress Questionnaire    Feeling of Stress : Only a little  Social Connections: Not on file    Allergies:  Allergies  Allergen Reactions   Doxazosin Nausea And Vomiting   Morphine  And Codeine Itching   Phenergan [Promethazine Hcl]     " knocks out for  several hours"   Aloe Hives   Penicillins Rash    Has patient had a PCN reaction causing immediate rash, facial/tongue/throat swelling, SOB or lightheadedness with hypotension: No Has patient had a PCN reaction causing severe rash involving mucus membranes or skin necrosis: No Has patient had a PCN reaction that required hospitalization No Has patient had a PCN reaction occurring within the last 10 years: No If all of the above answers are "NO", then may proceed with Cephalosporin use.    Sulfa Antibiotics Rash    Metabolic Disorder Labs: No results found for: "HGBA1C", "MPG" No results found for: "PROLACTIN" No results found for: "CHOL", "TRIG", "HDL", "CHOLHDL", "VLDL", "LDLCALC" Lab Results  Component Value Date   TSH 4.990 (H) 04/13/2023   TSH 4.610 (H) 05/31/2022    Therapeutic Level Labs: Lab Results  Component Value Date   LITHIUM  1.2 04/13/2023   LITHIUM  1.0 05/31/2022   Lab Results  Component Value Date   VALPROATE 61 04/13/2023   VALPROATE 91 05/31/2022   No results found for: "CBMZ"  Current Medications: Current Outpatient Medications  Medication Sig Dispense Refill   albuterol  (PROVENTIL  HFA;VENTOLIN  HFA) 108 (90 Base) MCG/ACT inhaler Inhale 1-2 puffs into the lungs every 6  (six) hours as needed for wheezing or shortness of breath.     botulinum toxin Type A  (BOTOX ) 100 units SOLR injection Provider to inject 155 units into the muscles of the head and neck every 3 months. Discard remainder. 1 each 3   botulinum toxin Type A  (BOTOX ) 100 units SOLR injection Inject 155 units into head and neck muscles, discard remainder. 2 each 2   cyclobenzaprine  (FLEXERIL ) 10 MG tablet TAKE 1/2 TO 1 TABLET(5 TO 10 MG) BY MOUTH AT BEDTIME AS NEEDED 90 tablet 4   divalproex  (DEPAKOTE  ER) 500 MG 24 hr tablet Take 3 tablets (1,500 mg total) by mouth daily. 180 tablet 0   famotidine  (PEPCID ) 20 MG tablet Take 1 tablet (20 mg total) by mouth 2 (two) times daily. 90 tablet 1   gabapentin  (NEURONTIN ) 100 MG capsule Take 1 capsule (100 mg total) by mouth 3 (three) times daily as needed. 90 capsule 6   hydrochlorothiazide  (HYDRODIURIL ) 25 MG tablet Take 1 tablet (25 mg total) by mouth every morning. 90 tablet 0   hydrOXYzine  (ATARAX /VISTARIL ) 25 MG tablet Take 25 mg by mouth at bedtime as needed for itching.  3   ipratropium (ATROVENT) 0.06 % nasal spray Place 2 sprays into both nostrils daily as needed. Allergies, runny nose.  4   ketorolac  (TORADOL ) 60 MG/2ML SOLN injection Inject 1-2ml (30-60mg ) intramuscularly at onset of migraine. May repeat in 6 hours. Max twice a day and 4 days per month. 10 mL 4   levocetirizine (XYZAL) 5 MG tablet Take 5 mg by mouth at bedtime.      levothyroxine (SYNTHROID) 25 MCG tablet Take 25 mcg by mouth every morning.     lithium  carbonate (LITHOBID ) 300 MG ER tablet Take 2 tablets (600 mg total) by mouth every evening. 120 tablet 0   methylPREDNISolone  (MEDROL  DOSEPAK) 4 MG TBPK tablet Take pills daily all together with food. Take the first dose (6 pills) as soon as possible. Take the rest each morning. For 6 days total 6-5-4-3-2-1. 21 tablet 1   methylPREDNISolone  (MEDROL  DOSEPAK) 4 MG TBPK tablet TAKE AS DIRECTED WITH FOOD 21 each 0   Multiple Vitamins-Minerals  (OPURITY BYPASS OPTIMIZED) CHEW Chew 1 tablet by mouth daily.     naratriptan  (AMERGE) 2.5  MG tablet TAKE 1 TABLET BY MOUTH FOR HEADACHE OR MIGRAINE. DO NOT EXCEED 2 DOSES OF ANY TRIPTAN IN 1 DAY 10 tablet 6   ondansetron  (ZOFRAN -ODT) 8 MG disintegrating tablet Take 1 tablet (8 mg total) by mouth every 8 (eight) hours as needed for nausea or vomiting. 30 tablet 11   prochlorperazine  (COMPAZINE ) 10 MG tablet Take 1 tablet (10 mg total) by mouth 3 (three) times daily as needed for nausea or vomiting (migraine). MAY CAUSE SEDATION. 30 tablet 3   Rimegepant Sulfate (NURTEC) 75 MG TBDP Take 1 tablet (75 mg total) by mouth daily as needed. For migraines. Take as close to onset of migraine as possible. One daily maximum. 16 tablet 11   Semaglutide -Weight Management (WEGOVY ) 0.5 MG/0.5ML SOAJ Inject 0.5 mg into the skin once a week. 2 mL 0   Semaglutide -Weight Management (WEGOVY ) 1 MG/0.5ML SOAJ Inject 1 mg into the skin once a week. 2 mL 0   sodium chloride  0.9 % SOLN 100 mL with Eptinezumab-jjmr 100 MG/ML SOLN 100 mg Inject 100 mg into the vein every 3 (three) months. 100 mg 4   SUMAtriptan  (TOSYMRA ) 10 MG/ACT SOLN Place 10 mg into the nose every hour as needed. 2 each 0   SUMAtriptan  Succinate (ZEMBRACE SYMTOUCH ) 3 MG/0.5ML SOAJ Inject 3 mg into the skin every 1 hour as needed for migraine. Max 4 injections in one day. 12 mL 11   SYRINGE-NEEDLE, DISP, 3 ML (BD INTEGRA SYRINGE) 25G X 1" 3 ML MISC ATTACH NEEDLE TO SYRINGE AND USE TO DRAW UP AND ADMINISTER TORADOL . DO NOT REUSE. 4 each 0   valACYclovir (VALTREX) 500 MG tablet Take 500 mg by mouth See admin instructions. Takes 500mg  daily for 5 days when she has a flare up.  3   Zavegepant HCl (ZAVZPRET ) 10 MG/ACT SOLN Place 1 spray into the nose daily as needed. PATIENT HAS COPAY CARD WILL BRING 6 each 11   Current Facility-Administered Medications  Medication Dose Route Frequency Provider Last Rate Last Admin   botulinum toxin Type A  (BOTOX ) injection 100  Units  100 Units Intramuscular Once Glory Larsen, MD       botulinum toxin Type A  (BOTOX ) injection 155 Units  155 Units Intramuscular Once Ahern, Antonia B, MD         Musculoskeletal: Virtual   Psychiatric Specialty Exam: Review of Systems  There were no vitals taken for this visit.There is no height or weight on file to calculate BMI.  General Appearance: Casual  Eye Contact:  Good  Speech:  Clear and Coherent  Volume:  Normal  Mood:  Anxious and Depressed  Affect:  Congruent  Thought Process:  Coherent  Orientation:  Full (Time, Place, and Person)  Thought Content: Logical   Suicidal Thoughts:  No  Homicidal Thoughts:  No  Memory:  Immediate;   Good Recent;   Good  Judgement:  Good  Insight:  Good  Psychomotor Activity:  Normal  Concentration:  Concentration: Good  Recall:  Good  Fund of Knowledge: Good  Language: Good  Akathisia:  No  Handed:  Right  AIMS (if indicated): not done  Assets:  Communication Skills Desire for Improvement Resilience Social Support  ADL's:  Intact  Cognition: WNL  Sleep:  Good   Screenings: PHQ2-9    Flowsheet Row Office Visit [facility] 10/17/2022 in [facility] Video Visit from 03[facility]31/2022 in [facility] Video Visit from 10/14/2021 [facility] Video Visit  from 07/15/2021 in [facility] Video Visit from 04/29/2021 in [facility]  PHQ-2 Total Score 3 0 0 0 2  PHQ-9 Total Score 18 -- -- -- 3      Flowsheet Row Video Visit from 03/31/2022 in [facility] Video Visit from 10/14/2021 in [facility] Video Visit from 07/15/2021 in [facility]  C-SSRS RISK CATEGORY No Risk No Risk No Risk        Assessment and Plan: Margaret Lopez  53 year old Caucasian female presents for medication management.  Carries a diagnosis with bipolar 1, attention deficit disorder inattentive type, major depressive disorder and generalized anxiety disorder.  She reports she has been taking and tolerating medications well.  Was eager to start nonstimulant medication for concentration and focus symptoms.  However states she was recently followed by her primary care provider who started her on a low-dose of Synthroid.  Discussed allowing time for medication to stabilize.  Will attempt to reintroduce Intuniv  at a later date.  She was receptive to plan.  Patient to follow-up 4 months.  Support, encouragement and  reassurance was provided.  Collaboration of Care: Collaboration of Care: Medication Management AEB continue current medications as indicated, discontinued Intuniv  at this time due to starting Synthroid by primary care provider  Patient/Guardian was advised Release of Information must be obtained prior to any record release in order to collaborate their care with an outside provider. Patient/Guardian was advised if they have not already done so to contact the registration department to sign all necessary forms in order for us  to release information regarding their care.   Consent: Patient/Guardian gives verbal consent for treatment and assignment of benefits for services provided during this visit. Patient/Guardian expressed understanding and agreed to proceed.    Levester Reagin, NP 05/22/2023, 3:59 PM

## 2023-05-23 ENCOUNTER — Other Ambulatory Visit (HOSPITAL_BASED_OUTPATIENT_CLINIC_OR_DEPARTMENT_OTHER): Payer: Self-pay

## 2023-05-24 ENCOUNTER — Other Ambulatory Visit (HOSPITAL_BASED_OUTPATIENT_CLINIC_OR_DEPARTMENT_OTHER): Payer: Self-pay

## 2023-05-25 ENCOUNTER — Other Ambulatory Visit (HOSPITAL_BASED_OUTPATIENT_CLINIC_OR_DEPARTMENT_OTHER): Payer: Self-pay

## 2023-05-26 ENCOUNTER — Other Ambulatory Visit (HOSPITAL_BASED_OUTPATIENT_CLINIC_OR_DEPARTMENT_OTHER): Payer: Self-pay

## 2023-05-26 ENCOUNTER — Other Ambulatory Visit (HOSPITAL_COMMUNITY): Payer: Self-pay

## 2023-05-29 ENCOUNTER — Other Ambulatory Visit (HOSPITAL_BASED_OUTPATIENT_CLINIC_OR_DEPARTMENT_OTHER): Payer: Self-pay

## 2023-05-29 ENCOUNTER — Other Ambulatory Visit (HOSPITAL_COMMUNITY): Payer: Self-pay

## 2023-05-30 ENCOUNTER — Other Ambulatory Visit (HOSPITAL_BASED_OUTPATIENT_CLINIC_OR_DEPARTMENT_OTHER): Payer: Self-pay

## 2023-05-31 ENCOUNTER — Other Ambulatory Visit (HOSPITAL_BASED_OUTPATIENT_CLINIC_OR_DEPARTMENT_OTHER): Payer: Self-pay

## 2023-05-31 MED ORDER — HYDROCHLOROTHIAZIDE 25 MG PO TABS
25.0000 mg | ORAL_TABLET | Freq: Every morning | ORAL | 0 refills | Status: DC
  Filled 2023-05-31 – 2023-06-15 (×2): qty 90, 90d supply, fill #0

## 2023-06-01 ENCOUNTER — Other Ambulatory Visit (HOSPITAL_BASED_OUTPATIENT_CLINIC_OR_DEPARTMENT_OTHER): Payer: Self-pay

## 2023-06-02 ENCOUNTER — Other Ambulatory Visit (HOSPITAL_BASED_OUTPATIENT_CLINIC_OR_DEPARTMENT_OTHER): Payer: Self-pay

## 2023-06-06 ENCOUNTER — Other Ambulatory Visit (HOSPITAL_BASED_OUTPATIENT_CLINIC_OR_DEPARTMENT_OTHER): Payer: Self-pay

## 2023-06-07 ENCOUNTER — Other Ambulatory Visit (HOSPITAL_COMMUNITY): Payer: Self-pay | Admitting: *Deleted

## 2023-06-07 ENCOUNTER — Other Ambulatory Visit (HOSPITAL_BASED_OUTPATIENT_CLINIC_OR_DEPARTMENT_OTHER): Payer: Self-pay

## 2023-06-07 DIAGNOSIS — F319 Bipolar disorder, unspecified: Secondary | ICD-10-CM

## 2023-06-07 MED ORDER — LITHIUM CARBONATE ER 300 MG PO TBCR: 600.0000 mg | EXTENDED_RELEASE_TABLET | Freq: Every evening | ORAL | 0 refills | Status: DC

## 2023-06-07 MED ORDER — DIVALPROEX SODIUM ER 500 MG PO TB24: 1500.0000 mg | ORAL_TABLET | Freq: Every day | ORAL | 0 refills | Status: DC

## 2023-06-08 ENCOUNTER — Other Ambulatory Visit (HOSPITAL_BASED_OUTPATIENT_CLINIC_OR_DEPARTMENT_OTHER): Payer: Self-pay

## 2023-06-12 ENCOUNTER — Other Ambulatory Visit (HOSPITAL_BASED_OUTPATIENT_CLINIC_OR_DEPARTMENT_OTHER): Payer: Self-pay

## 2023-06-15 ENCOUNTER — Other Ambulatory Visit (HOSPITAL_BASED_OUTPATIENT_CLINIC_OR_DEPARTMENT_OTHER): Payer: Self-pay

## 2023-06-15 MED ORDER — ZEPBOUND 2.5 MG/0.5ML ~~LOC~~ SOAJ
2.5000 mg | SUBCUTANEOUS | 0 refills | Status: DC
  Filled 2023-06-15: qty 2, 28d supply, fill #0

## 2023-06-16 ENCOUNTER — Other Ambulatory Visit (HOSPITAL_BASED_OUTPATIENT_CLINIC_OR_DEPARTMENT_OTHER): Payer: Self-pay

## 2023-06-19 ENCOUNTER — Other Ambulatory Visit (HOSPITAL_BASED_OUTPATIENT_CLINIC_OR_DEPARTMENT_OTHER): Payer: Self-pay

## 2023-06-20 ENCOUNTER — Other Ambulatory Visit (HOSPITAL_BASED_OUTPATIENT_CLINIC_OR_DEPARTMENT_OTHER): Payer: Self-pay

## 2023-06-21 ENCOUNTER — Other Ambulatory Visit (HOSPITAL_BASED_OUTPATIENT_CLINIC_OR_DEPARTMENT_OTHER): Payer: Self-pay

## 2023-06-22 ENCOUNTER — Other Ambulatory Visit (HOSPITAL_BASED_OUTPATIENT_CLINIC_OR_DEPARTMENT_OTHER): Payer: Self-pay

## 2023-07-06 ENCOUNTER — Ambulatory Visit: Attending: Physician Assistant | Admitting: Physical Therapy

## 2023-07-06 ENCOUNTER — Other Ambulatory Visit: Payer: Self-pay

## 2023-07-06 ENCOUNTER — Encounter: Payer: Self-pay | Admitting: Physical Therapy

## 2023-07-06 DIAGNOSIS — M25552 Pain in left hip: Secondary | ICD-10-CM | POA: Diagnosis present

## 2023-07-06 DIAGNOSIS — R29898 Other symptoms and signs involving the musculoskeletal system: Secondary | ICD-10-CM | POA: Insufficient documentation

## 2023-07-06 DIAGNOSIS — M6281 Muscle weakness (generalized): Secondary | ICD-10-CM | POA: Diagnosis present

## 2023-07-06 DIAGNOSIS — M25551 Pain in right hip: Secondary | ICD-10-CM | POA: Insufficient documentation

## 2023-07-06 NOTE — Therapy (Signed)
 OUTPATIENT PHYSICAL THERAPY LOWER EXTREMITY EVALUATION   Patient Name: Margaret Lopez MRN: [id number] DOB:07/23/1970, 53 y.o., female Today's Date: 07/06/2023  END OF SESSION:  PT End of Session - 07/06/23 1304     Visit Number 1    Number of Visits 13    Date for PT Re-Evaluation 08/31/23    Authorization Type [employer]    Authorization Time Period 07/06/23 to 08/31/23    PT Start Time 1302    PT Stop Time 1345    PT Time Calculation (min) 43 min    Activity Tolerance Patient tolerated treatment well    Behavior During Therapy [facility] for tasks assessed/performed          Past Medical History:  Diagnosis Date   Anxiety    Asthma    Bipolar disorder (HCC)    Depression    Deviated nasal septum 02/2011   Generalized anxiety disorder 04/16/2013   GERD (gastroesophageal reflux disease)    Headache(784.0)    migraines   Insomnia    Nasal turbinate hypertrophy 02/2011   bilat.   Neuromuscular disorder (HCC)    Dr. CHRISTELLA. Oneita- does injections around nerve in her head for prevention of migraines - q 3 months    Pneumonia    hx of    PONV (postoperative nausea and vomiting)    Seasonal allergies    Past Surgical History:  Procedure Laterality Date   ABDOMINAL HYSTERECTOMY     CARPAL TUNNEL RELEASE Right    DIAGNOSTIC LAPAROSCOPY     EXCISION MASS LOWER EXTREMETIES Left 06/21/2018   Procedure: Excision of left upper leg fat necrosis;  Surgeon: Lowery Estefana RAMAN, DO;  Location: [facility];  Service: Plastics;  Laterality: Left;   FOOT SURGERY Right    bone removed from great toe    GASTRIC ROUX-EN-Y N/A 05/16/2016   Procedure: LAPAROSCOPIC ROUX-EN-Y GASTRIC BYPASS WITH UPPER ENDOSCOPY;  Surgeon: Signe Mitzie LABOR, MD;  Location: [facility];  Service: General;  Laterality: N/A;   LAPAROSCOPIC VAGINAL HYSTERECTOMY  03/14/2007   LIPOMA EXCISION Left 08/26/2016   Procedure: EXCISION LIPOMA FROM LEFT UPPER POSTERIOR THIGH;  Surgeon: Signe Mitzie LABOR, MD;  Location: [facility];  Service: General;  Laterality: Left;   MYRINGOTOMY WITH TUBE PLACEMENT Bilateral 03/02/2015   Procedure: MYRINGOTOMY WITH T-TUBE PLACEMENT;  Surgeon: Daniel Moccasin, MD;  Location: [facility];  Service: ENT;  Laterality: Bilateral;   NASAL SEPTOPLASTY W/ TURBINOPLASTY  02/22/2011   Procedure: NASAL SEPTOPLASTY WITH TURBINATE REDUCTION;  Surgeon: Ana LELON Moccasin, MD;  Location: [facility];  Service: ENT;  Laterality: Bilateral;   TUBAL LIGATION  12/16/2004   TYMPANOSTOMY TUBE PLACEMENT Bilateral 07/21/2017   Patient Active Problem List   Diagnosis Date Noted   Chronic migraine without aura without status migrainosus, not intractable 12/05/2021   Migraine without aura and without status migrainosus, not intractable 12/05/2021   ERRONEOUS ENCOUNTER--DISREGARD 04/15/2021   Hematoma of leg 03/02/2018   Chronic migraine without aura, with intractable migraine, so stated, with status migrainosus 06/18/2017   GAD (generalized anxiety disorder) 04/16/2013   Bipolar 1 disorder, depressed (HCC) 04/16/2013   Generalized anxiety disorder 04/16/2013    PCP: Signa Rush MD   REFERRING PROVIDER: Alys Schuyler CHRISTELLA, PA  REFERRING DIAG: B hip pain   THERAPY DIAG:  Pain in right hip  Pain in left hip  Muscle weakness (generalized)  Other symptoms and signs involving the musculoskeletal system  Rationale for Evaluation and Treatment:  Rehabilitation  ONSET DATE: several months   SUBJECTIVE:   SUBJECTIVE STATEMENT:  This has been happening for awhile, when I walk a lot they really stiffen up. 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 The last time I was here was when I got my leg stuck between a street sign and a trailer tire and the muscles got ripped off the bone. It doesn't seem like these things are related. Its hard to lift legs up to get in the car. Low back is tight but doesn't seem to be related to this issue.   PERTINENT HISTORY: See above  PAIN:  Are you having  pain? Yes: NPRS scale: 1/10 now, can get to 9/10 at worst   Pain location: B hips  Pain description: stiffness  Aggravating factors: sitting for too long or standing/walking for too long  Relieving factors: nothing   PRECAUTIONS: None  RED FLAGS: None   WEIGHT BEARING RESTRICTIONS: No  FALLS:  Has patient fallen in last 6 months? 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    PATIENT SURVEYS:   LEFS 31/80  COGNITION: Overall cognitive status: Within functional limits for tasks assessed     SENSATION: Not tested    MUSCLE LENGTH:  HS mod limitation B  Piriformis OK  POSTURE: rounded shoulders and forward head    ROM:  Lumbar flexion 25% limited Lumbar extension WNL Lumbar lateral flexion WNL  Lumbar rotation 30% limited B  Hip flexion, IR, ER PROM OK     LOWER EXTREMITY MMT:  MMT Right eval Left eval  Hip flexion 3 3  Hip extension 3- 3-  Hip abduction 3 3  Hip adduction    Hip internal rotation    Hip external rotation    Knee flexion 4- 4-  Knee extension 4 4  Ankle dorsiflexion    Ankle plantarflexion    Ankle inversion    Ankle eversion     (Blank rows = not tested)  LOWER EXTREMITY SPECIAL TESTS:   FABER (+) L, (-) R  FADIR OK B  Scour test (-) L, (+) R                                                                                                                                      TREATMENT DATE:   07/06/23  Eval, POC, HEP     PATIENT EDUCATION:  Education  details: exam findings, POC, HEP; if hip pain does not improve with skilled PT may need to refer back to MD for further w/u, suspicion for some potential rheumatological involvement  Person educated: Patient Education method: Explanation, Demonstration, and Handouts Education comprehension: verbalized understanding, returned demonstration, and needs further education  HOME EXERCISE PROGRAM: Access Code: [email or web address] URL: [email or web address] Date: 07/06/2023 Prepared by: Josette Rough  Exercises - Supine Single Knee to Chest Stretch  - 1-2 x daily - 7 x weekly - 1 sets - 10 reps - 5 seconds  hold - Supine Lower Trunk Rotation  - 1-2 x daily - 7 x weekly - 1 sets - 10 reps - 5 seconds  hold - Supine Bridge  - 1 x daily - 5 x weekly - 2 sets - 10 reps - 1 second  hold - Seated Hip Abduction with Resistance  - 1 x daily - 5 x weekly - 2 sets - 10 reps - 1 second  hold  ASSESSMENT:  CLINICAL IMPRESSION:  Patient is a 53 y.o. F who was seen today for physical therapy evaluation and treatment for B hip pain. Her hip pain started insidiously and has gotten worse over the past few months. Objectives as above. Bilateral joint pain starting at the same time with some reports of lumbar stiffness does make me question if there could possible be rheumatological involvement, we will monitor. We will make every effort to improve pain and function moving forward.    OBJECTIVE IMPAIRMENTS: Abnormal gait, decreased activity tolerance, decreased mobility, difficulty walking, decreased strength, impaired flexibility, and pain.   ACTIVITY LIMITATIONS: sitting, standing, squatting, sleeping, stairs, transfers, bed mobility, and locomotion level  PARTICIPATION LIMITATIONS: driving, shopping, community activity, occupation, and yard work  PERSONAL FACTORS: Age, Behavior pattern, Fitness, Past/current  experiences, and Time since onset of injury/illness/exacerbation are also affecting patient's functional outcome.   REHAB POTENTIAL: Fair sedentary lifestyle, chronicity of pain, question possible rheumatological involvement complicating PT    CLINICAL DECISION MAKING: Stable/uncomplicated  EVALUATION COMPLEXITY: Low   GOALS: Goals reviewed with patient? No  SHORT TERM GOALS: Target date: 07/27/2023   Will be compliant with appropriate progressive HEP  Baseline: Goal status: INITIAL  2.  Lumbar AROM to have normalized  Baseline:  Goal status: INITIAL  3.  HS flexibility to be no more than 25% limited B  Baseline:  Goal status: INITIAL  4.  Pain with extended sitting or standing/walking to be no more than 6/10 Baseline:  Goal status: INITIAL    LONG TERM GOALS: Target date: 08/17/2023    MMT to have improved by at least one grade in all weak groups  Baseline:  Goal status: INITIAL  2.  Pain with all functional extended tasks (sitting, walking, standing) to be no more than 3/10 at worst  Baseline:  Goal status: INITIAL  3.  Will be able to perform car transfers with hip pain no more than 3/10 Baseline:  Goal status: INITIAL  4.  LEFS to have improved by at least 20 points  Baseline:  Goal status: INITIAL     PLAN:  PT FREQUENCY: 1-2x/week  PT DURATION: 6 weeks  PLANNED INTERVENTIONS: 97750- Physical Performance Testing, 97110-Therapeutic exercises, 97530- Therapeutic activity, [id number]- Neuromuscular re-education, 97535- Self Care, 02859- Manual therapy, [id number]- Gait training, and [phone]- Ultrasound  PLAN FOR NEXT SESSION: monitor for worsening sx that might further indicate rheumatological involvement/warrant referral back to MD, otherwise lumbar ROM, hip and core strength, could try US  if pain persists  Josette Rough, PT, DPT 07/06/23 2:02 PM

## 2023-07-17 ENCOUNTER — Other Ambulatory Visit (HOSPITAL_BASED_OUTPATIENT_CLINIC_OR_DEPARTMENT_OTHER): Payer: Self-pay

## 2023-07-20 ENCOUNTER — Other Ambulatory Visit (HOSPITAL_BASED_OUTPATIENT_CLINIC_OR_DEPARTMENT_OTHER): Payer: Self-pay

## 2023-07-20 ENCOUNTER — Ambulatory Visit: Attending: Physician Assistant | Admitting: Physical Therapy

## 2023-07-20 DIAGNOSIS — M25552 Pain in left hip: Secondary | ICD-10-CM | POA: Diagnosis present

## 2023-07-20 DIAGNOSIS — R29898 Other symptoms and signs involving the musculoskeletal system: Secondary | ICD-10-CM | POA: Insufficient documentation

## 2023-07-20 DIAGNOSIS — M25551 Pain in right hip: Secondary | ICD-10-CM | POA: Insufficient documentation

## 2023-07-20 DIAGNOSIS — M6281 Muscle weakness (generalized): Secondary | ICD-10-CM | POA: Diagnosis present

## 2023-07-20 MED ORDER — ZEPBOUND 5 MG/0.5ML ~~LOC~~ SOAJ
5.0000 mg | SUBCUTANEOUS | 0 refills | Status: DC
  Filled 2023-07-20: qty 2, 28d supply, fill #0

## 2023-07-20 NOTE — Therapy (Signed)
 OUTPATIENT PHYSICAL THERAPY LOWER EXTREMITY EVALUATION   Patient Name: Margaret Lopez MRN: [id number] DOB:03-Oct-1970, 53 y.o., female Today's Date: 07/20/2023  END OF SESSION:  PT End of Session - 07/20/23 0930     Visit Number 2    Number of Visits 13    Date for PT Re-Evaluation 08/31/23    Authorization Type [employer]    Authorization Time Period 07/06/23 to 08/31/23    PT Start Time 0930    PT Stop Time 1015    PT Time Calculation (min) 45 min          Past Medical History:  Diagnosis Date   Anxiety    Asthma    Bipolar disorder (HCC)    Depression    Deviated nasal septum 02/2011   Generalized anxiety disorder 04/16/2013   GERD (gastroesophageal reflux disease)    Headache(784.0)    migraines   Insomnia    Nasal turbinate hypertrophy 02/2011   bilat.   Neuromuscular disorder (HCC)    Dr. CHRISTELLA. Oneita- does injections around nerve in her head for prevention of migraines - q 3 months    Pneumonia    hx of    PONV (postoperative nausea and vomiting)    Seasonal allergies    Past Surgical History:  Procedure Laterality Date   ABDOMINAL HYSTERECTOMY     CARPAL TUNNEL RELEASE Right    DIAGNOSTIC LAPAROSCOPY     EXCISION MASS LOWER EXTREMETIES Left 06/21/2018   Procedure: Excision of left upper leg fat necrosis;  Surgeon: Lowery Estefana RAMAN, DO;  Location: [facility];  Service: Plastics;  Laterality: Left;   FOOT SURGERY Right    bone removed from great toe    GASTRIC ROUX-EN-Y N/A 05/16/2016   Procedure: LAPAROSCOPIC ROUX-EN-Y GASTRIC BYPASS WITH UPPER ENDOSCOPY;  Surgeon: Signe Mitzie LABOR, MD;  Location: [facility];  Service: General;  Laterality: N/A;   LAPAROSCOPIC VAGINAL HYSTERECTOMY  03/14/2007   LIPOMA EXCISION Left 08/26/2016   Procedure: EXCISION LIPOMA FROM LEFT UPPER POSTERIOR THIGH;  Surgeon: Signe Mitzie LABOR, MD;  Location: [facility];  Service: General;  Laterality: Left;   MYRINGOTOMY WITH TUBE PLACEMENT Bilateral 03/02/2015   Procedure:  MYRINGOTOMY WITH T-TUBE PLACEMENT;  Surgeon: Daniel Moccasin, MD;  Location: [facility];  Service: ENT;  Laterality: Bilateral;   NASAL SEPTOPLASTY W/ TURBINOPLASTY  02/22/2011   Procedure: NASAL SEPTOPLASTY WITH TURBINATE REDUCTION;  Surgeon: Ana LELON Moccasin, MD;  Location: [facility];  Service: ENT;  Laterality: Bilateral;   TUBAL LIGATION  12/16/2004   TYMPANOSTOMY TUBE PLACEMENT Bilateral 07/21/2017   Patient Active Problem List   Diagnosis Date Noted   Chronic migraine without aura without status migrainosus, not intractable 12/05/2021   Migraine without aura and without status migrainosus, not intractable 12/05/2021   ERRONEOUS ENCOUNTER--DISREGARD 04/15/2021   Hematoma of leg 03/02/2018   Chronic migraine without aura, with intractable migraine, so stated, with status migrainosus 06/18/2017   GAD (generalized anxiety disorder) 04/16/2013   Bipolar 1 disorder, depressed (HCC) 04/16/2013   Generalized anxiety disorder 04/16/2013    PCP: Signa Rush MD   REFERRING PROVIDER: Alys Schuyler CHRISTELLA, PA  REFERRING DIAG: B hip pain   THERAPY DIAG:  Pain in right hip  Pain in left hip  Muscle weakness (generalized)  Rationale for Evaluation and Treatment: Rehabilitation  ONSET DATE: several months   SUBJECTIVE:   SUBJECTIVE STATEMENT:  Did HEP some but busy. More stiff than pain  This has been happening  for awhile, when I walk a lot they really stiffen up. 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 The last time I was here was when I got my leg stuck between a street sign and a trailer tire and the muscles got ripped off the bone. It doesn't seem like these things are related. Its hard to lift legs up to get in the car. Low back is tight but doesn't seem to be related to this issue.   PERTINENT HISTORY: See above  PAIN:  Are you having pain? Yes: NPRS scale: 1/10 now, can get to 9/10 at worst   Pain location: B hips  Pain description: stiffness  Aggravating  factors: sitting for too long or standing/walking for too long  Relieving factors: nothing   PRECAUTIONS: None  RED FLAGS: None   WEIGHT BEARING RESTRICTIONS: No  FALLS:  Has patient fallen in last 6 months? 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    PATIENT SURVEYS:   LEFS 31/80  COGNITION: Overall cognitive status: Within functional limits for tasks assessed     SENSATION: Not tested    MUSCLE LENGTH:  HS mod limitation B  Piriformis OK  POSTURE: rounded shoulders and forward head    ROM:  Lumbar flexion 25% limited Lumbar extension WNL Lumbar lateral flexion WNL  Lumbar rotation 30% limited B  Hip flexion, IR, ER PROM OK     LOWER EXTREMITY MMT:  MMT Right eval Left eval  Hip flexion 3 3  Hip extension 3- 3-  Hip abduction 3 3  Hip adduction    Hip internal rotation    Hip external rotation    Knee flexion 4- 4-  Knee extension 4 4  Ankle dorsiflexion    Ankle plantarflexion    Ankle inversion    Ankle eversion     (Blank rows = not tested)  LOWER EXTREMITY SPECIAL TESTS:   FABER (+) L, (-) R  FADIR OK B  Scour test (-) L, (+) R                                                                                                                                     TREATMENT DATE:   07/20/23 Nustep L 4 LE only ADD ball squeeze 20 x Green tband clams 20x Green tband  hip flex alt 20  Red tband standing BIL 3 ways 12 x  each Wt ball STS 10 x Feet on ball bridge, KTC and abd Feet on ball alt SLR 20 x PROM and stretching BIL LE and trunk- BIL tight HS and LEft side tighter in general vs RT   07/06/23  Eval, POC, HEP     PATIENT EDUCATION:  Education details: exam findings, POC, HEP; if hip pain does not improve with skilled PT may need to refer back to MD for further w/u, suspicion for some potential rheumatological involvement  Person educated: Patient Education method: Explanation, Demonstration, and Handouts Education comprehension: verbalized understanding, returned demonstration, and needs further education  HOME EXERCISE PROGRAM: Access Code: [email or web address] URL: [email or web address] Date: 07/20/2023 Prepared by: Zebbie Ace  Exercises - Standing Hip Flexion with Resistance Loop  - 1 x daily - 7 x weekly - 2 sets - 10 reps - Hip Abduction with Resistance Loop  - 1 x daily - 7 x weekly - 2 sets - 10 reps - Hip Extension with Resistance Loop  - 1 x daily - 7 x weekly - 2 sets - 10 reps - Seated Table Hamstring Stretch  - 2-3 x daily - 7 x weekly - 2 sets - 3 reps - 30 hold  Access Code: [email or web address] URL: [email or web address] Date: 07/06/2023 Prepared by: Josette Rough  Exercises - Supine Single Knee to Chest Stretch  - 1-2 x daily - 7 x weekly - 1 sets - 10 reps - 5 seconds  hold - Supine Lower Trunk Rotation  - 1-2 x daily - 7 x weekly - 1 sets - 10 reps - 5 seconds  hold - Supine Bridge  - 1 x daily - 5 x weekly - 2 sets - 10 reps - 1 second  hold - Seated Hip Abduction with Resistance  - 1 x daily - 5 x weekly - 2 sets - 10 reps - 1 second  hold  ASSESSMENT:  CLINICAL IMPRESSION:  pt arrives for first visit after eval. More tightness vs pain. Reviewed HEP and added strengthening to HEP. Weakness noted by shaking and fatigue with ex.Assessed and documented goals.  Patient is a 53 y.o. F who was seen today for  physical therapy evaluation and treatment for B hip pain. Her hip pain started insidiously and has gotten worse over the past few months. Objectives as above. Bilateral joint pain starting at the same time with some reports of lumbar stiffness does make me question if there could possible be rheumatological involvement, we will monitor. We will make every effort to improve pain and function moving forward.    OBJECTIVE IMPAIRMENTS: Abnormal gait, decreased activity tolerance, decreased mobility, difficulty walking, decreased strength, impaired flexibility, and pain.   ACTIVITY LIMITATIONS: sitting, standing, squatting, sleeping, stairs, transfers, bed mobility, and locomotion level  PARTICIPATION LIMITATIONS: driving, shopping, community activity, occupation, and yard work  PERSONAL FACTORS: Age, Behavior pattern, Fitness, Past/current experiences, and Time since onset of injury/illness/exacerbation are also affecting patient's functional outcome.   REHAB POTENTIAL: Fair sedentary lifestyle, chronicity of pain, question possible rheumatological involvement complicating PT    CLINICAL DECISION MAKING: Stable/uncomplicated  EVALUATION COMPLEXITY: Low   GOALS: Goals reviewed with patient? No  SHORT TERM GOALS: Target date: 07/27/2023   Will be compliant with appropriate progressive HEP  Baseline: Goal status: MET 07/20/23  2.  Lumbar AROM to have normalized  Baseline:  Goal status: 07/20/23 progressing WLS minus flexion limited 40%- HS tightness  3.  HS flexibility to be no more than 25% limited B  Baseline:  Goal status:  MET 07/20/23   4.  Pain with extended sitting or standing/walking to be no more than 6/10 Baseline:  Goal status: 07/20/23 progressing    LONG TERM GOALS: Target date: 08/17/2023    MMT to have improved by at least one grade in all weak groups  Baseline:  Goal status: INITIAL  2.  Pain with all functional extended tasks (sitting, walking, standing) to be no more  than 3/10 at worst  Baseline:  Goal status: INITIAL  3.  Will be able to perform car transfers with hip pain no more than 3/10 Baseline:  Goal status: INITIAL  4.  LEFS to have improved by at least 20 points  Baseline:  Goal status: INITIAL     PLAN:  PT FREQUENCY: 1-2x/week  PT DURATION: 6 weeks  PLANNED INTERVENTIONS: 97750- Physical Performance Testing, 97110-Therapeutic exercises, 97530- Therapeutic activity, 97112- Neuromuscular re-education, 97535- Self Care, 02859- Manual therapy, [phone]- Gait training, and [phone]- Ultrasound  PLAN FOR NEXT SESSION: assess response to todays sessions and progress    monitor for worsening sx that might further indicate rheumatological involvement/warrant referral back to MD, otherwise lumbar ROM, hip and core strength, could try US  if pain persists   Jon Khaila Velarde PTA 07/20/23 9:31 AM

## 2023-07-23 ENCOUNTER — Other Ambulatory Visit: Payer: Self-pay | Admitting: Neurology

## 2023-07-24 ENCOUNTER — Other Ambulatory Visit: Payer: Self-pay | Admitting: Neurology

## 2023-07-24 ENCOUNTER — Other Ambulatory Visit: Payer: Self-pay

## 2023-07-24 ENCOUNTER — Other Ambulatory Visit: Payer: Self-pay | Admitting: Pharmacy Technician

## 2023-07-24 MED ORDER — BOTOX 100 UNITS IJ SOLR
INTRAMUSCULAR | 3 refills | Status: AC
  Filled 2023-07-24: qty 2, 84d supply, fill #0
  Filled 2023-10-11 – 2023-10-12 (×2): qty 2, 84d supply, fill #1
  Filled 2024-01-02: qty 2, 84d supply, fill #2

## 2023-07-24 NOTE — Telephone Encounter (Signed)
 done

## 2023-07-24 NOTE — Telephone Encounter (Signed)
 Please send refills to [facility], thank you!

## 2023-07-24 NOTE — Progress Notes (Signed)
 Specialty Pharmacy Refill Coordination Note  Margaret Lopez is a 53 y.o. female contacted today regarding refills of specialty medication(s) OnabotulinumtoxinA  (BOTOX )   Patient requested Courier to Provider Office   Delivery date: 07/27/23   Verified address: [facility] [address] [place]   Medication will be filled on 07/26/23.  Injection Appointment: 08/01/23.   RR sent to MD (message Maurine HERO as well )  This fill date is pending response to refill request from provider. Patient is aware and if they have not received fill by intended date they must follow up with pharmacy.

## 2023-07-26 ENCOUNTER — Other Ambulatory Visit: Payer: Self-pay

## 2023-07-27 ENCOUNTER — Encounter: Payer: Self-pay | Admitting: Physical Therapy

## 2023-07-27 ENCOUNTER — Ambulatory Visit: Admitting: Physical Therapy

## 2023-07-27 DIAGNOSIS — M6281 Muscle weakness (generalized): Secondary | ICD-10-CM

## 2023-07-27 DIAGNOSIS — M25552 Pain in left hip: Secondary | ICD-10-CM

## 2023-07-27 DIAGNOSIS — R29898 Other symptoms and signs involving the musculoskeletal system: Secondary | ICD-10-CM

## 2023-07-27 DIAGNOSIS — M25551 Pain in right hip: Secondary | ICD-10-CM | POA: Diagnosis not present

## 2023-07-27 NOTE — Therapy (Signed)
 OUTPATIENT PHYSICAL THERAPY LOWER EXTREMITY TREATMENT    Patient Name: Margaret Lopez MRN: [id number] DOB:[birth year], 53 y.o., female Today's Date: 07/27/2023  END OF SESSION:  PT End of Session - 07/27/23 1533     Visit Number 3    Number of Visits 13    Date for PT Re-Evaluation 08/31/23    Authorization Type [employer]    Authorization Time Period 07/06/23 to 08/31/23    PT Start Time 1530   pt arrived late   PT Stop Time 1555    PT Time Calculation (min) 25 min    Activity Tolerance Patient tolerated treatment well    Behavior During Therapy [employer] for tasks assessed/performed           Past Medical History:  Diagnosis Date   Anxiety    Asthma    Bipolar disorder (HCC)    Depression    Deviated nasal septum 02/2011   Generalized anxiety disorder 04/16/2013   GERD (gastroesophageal reflux disease)    Headache(784.0)    migraines   Insomnia    Nasal turbinate hypertrophy 02/2011   bilat.   Neuromuscular disorder (HCC)    Dr. CHRISTELLA. Oneita- does injections around nerve in her head for prevention of migraines - q 3 months    Pneumonia    hx of    PONV (postoperative nausea and vomiting)    Seasonal allergies    Past Surgical History:  Procedure Laterality Date   ABDOMINAL HYSTERECTOMY     CARPAL TUNNEL RELEASE Right    DIAGNOSTIC LAPAROSCOPY     EXCISION MASS LOWER EXTREMETIES Left 06/21/2018   Procedure: Excision of left upper leg fat necrosis;  Surgeon: Lowery Estefana RAMAN, DO;  Location: [facility];  Service: Plastics;  Laterality: Left;   FOOT SURGERY Right    bone removed from great toe    GASTRIC ROUX-EN-Y N/A 05/16/2016   Procedure: LAPAROSCOPIC ROUX-EN-Y GASTRIC BYPASS WITH UPPER ENDOSCOPY;  Surgeon: Signe Mitzie LABOR, MD;  Location: [facility];  Service: General;  Laterality: N/A;   LAPAROSCOPIC VAGINAL HYSTERECTOMY  03/14/2007   LIPOMA EXCISION Left 08/26/2016   Procedure: EXCISION LIPOMA FROM LEFT UPPER POSTERIOR THIGH;  Surgeon: Signe Mitzie LABOR,  MD;  Location: [facility];  Service: General;  Laterality: Left;   MYRINGOTOMY WITH TUBE PLACEMENT Bilateral 03/02/2015   Procedure: MYRINGOTOMY WITH T-TUBE PLACEMENT;  Surgeon: Daniel Moccasin, MD;  Location: [facility];  Service: ENT;  Laterality: Bilateral;   NASAL SEPTOPLASTY W/ TURBINOPLASTY  02/22/2011   Procedure: NASAL SEPTOPLASTY WITH TURBINATE REDUCTION;  Surgeon: Ana LELON Moccasin, MD;  Location: [facility];  Service: ENT;  Laterality: Bilateral;   TUBAL LIGATION  12/16/2004   TYMPANOSTOMY TUBE PLACEMENT Bilateral 07/21/2017   Patient Active Problem List   Diagnosis Date Noted   Chronic migraine without aura without status migrainosus, not intractable 12/05/2021   Migraine without aura and without status migrainosus, not intractable 12/05/2021   ERRONEOUS ENCOUNTER--DISREGARD 04/15/2021   Hematoma of leg 03/02/2018   Chronic migraine without aura, with intractable migraine, so stated, with status migrainosus 06/18/2017   GAD (generalized anxiety disorder) 04/16/2013   Bipolar 1 disorder, depressed (HCC) 04/16/2013   Generalized anxiety disorder 04/16/2013    PCP: Signa Rush MD   REFERRING PROVIDER: Alys Schuyler CHRISTELLA, PA  REFERRING DIAG: B hip pain   THERAPY DIAG:  Pain in right hip  Pain in left hip  Muscle weakness (generalized)  Other symptoms and signs involving the musculoskeletal system  Rationale for Evaluation and Treatment: Rehabilitation  ONSET DATE: several months   SUBJECTIVE:   SUBJECTIVE STATEMENT:  Nothing really new, the exercises from [employer] are going well. Its been crazy this week between 2 dogs, 3 boys, and a spouse.   EVAL: This has been happening for awhile, when I walk a lot they really stiffen up. 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 The last time I was here was when I got my leg stuck between a street sign and a trailer tire and the muscles got ripped off the bone. It doesn't seem like these things are related. Its hard to lift  legs up to get in the car. Low back is tight but doesn't seem to be related to this issue.   PERTINENT HISTORY: See above  PAIN:  Are you having pain? No 0/10 just tight   PRECAUTIONS: None  RED FLAGS: None   WEIGHT BEARING RESTRICTIONS: No  FALLS:  Has patient fallen in last 6 months? 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    PATIENT SURVEYS:   LEFS 31/80  COGNITION: Overall cognitive status: Within functional limits for tasks assessed     SENSATION: Not tested    MUSCLE LENGTH:  HS mod limitation B  Piriformis OK  POSTURE: rounded shoulders and forward head    ROM:  Lumbar flexion 25% limited Lumbar extension WNL Lumbar lateral flexion WNL  Lumbar rotation 30% limited B  Hip flexion, IR, ER PROM OK     LOWER EXTREMITY MMT:  MMT Right eval Left eval  Hip flexion 3 3  Hip extension 3- 3-  Hip abduction 3 3  Hip adduction    Hip internal rotation    Hip external rotation    Knee flexion 4- 4-  Knee extension 4 4  Ankle dorsiflexion    Ankle plantarflexion    Ankle inversion    Ankle eversion     (Blank rows = not tested)  LOWER EXTREMITY SPECIAL TESTS:   FABER (+) L, (-) R  FADIR OK B  Scour test (-) L, (+) R                                                                                                                                     TREATMENT DATE:    07/27/23  Nustep L4x8 minutes seat 8, BLEs only  DKTC with towel behind knees  6x5 seconds  Figure 4 stretch 2x30 seconds B  HS stretch 2x30 seconds B Quadruped green TB hip ABD x10 B Quadruped green TB hip extensions/knee bent x10 B Hip hikes x12 B    07/20/23 Nustep L 4 LE only ADD ball squeeze 20 x Green tband clams 20x Green tband hip flex alt 20  Red tband standing BIL 3 ways 12 x each Wt ball STS 10 x Feet on ball bridge, KTC and abd Feet on ball alt SLR 20 x PROM and stretching BIL LE and trunk- BIL tight HS and LEft side tighter in general vs RT   07/06/23  Eval, POC, HEP     PATIENT EDUCATION:  Education details: exam findings, POC, HEP; if hip pain does not improve with skilled PT may need to refer back to MD for further w/u, suspicion for some potential rheumatological involvement  Person educated: Patient Education method: Explanation, Demonstration, and Handouts Education comprehension: verbalized understanding, returned demonstration, and needs further education  HOME EXERCISE PROGRAM: Access Code: [email or web address] URL: [email or web address] Date: 07/20/2023 Prepared by: Angela Payseur  Exercises - Standing Hip Flexion with Resistance Loop  - 1 x daily - 7 x weekly - 2 sets - 10 reps - Hip Abduction with Resistance Loop  - 1 x daily - 7 x weekly - 2 sets - 10 reps - Hip Extension with Resistance Loop  - 1 x daily - 7 x weekly - 2 sets - 10 reps - Seated Table Hamstring Stretch  - 2-3 x daily - 7 x weekly - 2 sets - 3 reps - 30 hold  Access Code: [email or web address] URL: [email or web address] Date: 07/06/2023 Prepared by: Josette Rough  Exercises - Supine Single Knee to Chest Stretch  - 1-2 x daily - 7 x weekly - 1 sets - 10 reps - 5 seconds  hold - Supine Lower Trunk Rotation  - 1-2 x daily - 7 x weekly - 1 sets - 10 reps - 5 seconds  hold - Supine Bridge  - 1 x daily - 5 x weekly - 2 sets - 10 reps - 1 second  hold - Seated Hip  Abduction with Resistance  - 1 x daily - 5 x weekly - 2 sets - 10 reps - 1 second  hold  ASSESSMENT:  CLINICAL IMPRESSION:    Arrived late today. Worked on functional strength and lumbar/hip mobility as time allowed today, tolerated session well.    EVAL: Patient is a 53 y.o. F who was seen today for physical therapy evaluation and treatment for B hip pain. Her hip pain started insidiously and has gotten worse over the past few months. Objectives as above. Bilateral joint pain starting at the same time with some reports of lumbar stiffness does make me question if there could possible be rheumatological involvement, we will monitor. We will make every effort to improve pain and function moving forward.    OBJECTIVE IMPAIRMENTS: Abnormal gait, decreased activity tolerance, decreased mobility, difficulty walking, decreased strength, impaired flexibility, and pain.   ACTIVITY LIMITATIONS: sitting, standing, squatting, sleeping, stairs, transfers, bed mobility, and locomotion level  PARTICIPATION LIMITATIONS: driving, shopping, community activity, occupation, and yard work  PERSONAL FACTORS: Age, Behavior pattern, Fitness, Past/current experiences, and Time since onset of injury/illness/exacerbation are also affecting patient's functional outcome.   REHAB POTENTIAL: Fair sedentary lifestyle, chronicity of pain, question possible rheumatological involvement complicating PT    CLINICAL DECISION MAKING: Stable/uncomplicated  EVALUATION COMPLEXITY: Low   GOALS: Goals reviewed with patient? No  SHORT TERM GOALS: Target date: 07/27/2023   Will be compliant with appropriate progressive HEP  Baseline: Goal status: MET 07/20/23  2.  Lumbar AROM to have normalized  Baseline:  Goal status: 07/20/23 progressing WLS minus flexion limited 40%- HS tightness  3.  HS flexibility to be no more than 25% limited B  Baseline:  Goal status: MET 07/20/23   4.  Pain with extended sitting or  standing/walking to be no more than 6/10 Baseline:  Goal status: 07/20/23 progressing    LONG TERM GOALS: Target date: 08/17/2023    MMT to have improved by at least one grade in all weak groups  Baseline:  Goal status: INITIAL  2.  Pain with all functional extended tasks (sitting, walking, standing) to be no more than 3/10 at worst  Baseline:  Goal status: INITIAL  3.  Will be able to perform car transfers with hip pain no more than 3/10 Baseline:  Goal status: INITIAL  4.  LEFS to have improved by at least 20 points  Baseline:  Goal status: INITIAL     PLAN:  PT FREQUENCY: 1-2x/week  PT DURATION: 6 weeks  PLANNED INTERVENTIONS: 97750- Physical Performance Testing, 97110-Therapeutic exercises, 97530- Therapeutic activity, 97112- Neuromuscular re-education, 97535- Self Care, [id number]- Manual therapy, [phone]- Gait training, and [phone]- Ultrasound  PLAN FOR NEXT SESSION: assess and progress, update objectives next visit   monitor for worsening sx that might further indicate rheumatological involvement/warrant referral back to MD, otherwise lumbar ROM, hip and core strength, could try US  if pain persists   Josette Rough, PT, DPT 07/27/23 3:56 PM

## 2023-07-30 ENCOUNTER — Other Ambulatory Visit (HOSPITAL_COMMUNITY): Payer: Self-pay | Admitting: Family

## 2023-07-30 DIAGNOSIS — F319 Bipolar disorder, unspecified: Secondary | ICD-10-CM

## 2023-08-01 ENCOUNTER — Other Ambulatory Visit: Payer: Self-pay | Admitting: Neurology

## 2023-08-01 ENCOUNTER — Ambulatory Visit: Admitting: Neurology

## 2023-08-01 VITALS — BP 125/67 | HR 77

## 2023-08-01 DIAGNOSIS — G43709 Chronic migraine without aura, not intractable, without status migrainosus: Secondary | ICD-10-CM | POA: Diagnosis not present

## 2023-08-01 MED ORDER — ONABOTULINUMTOXINA 100 UNITS IJ SOLR
155.0000 [IU] | Freq: Once | INTRAMUSCULAR | Status: AC
  Administered 2023-08-01: 155 [IU] via INTRAMUSCULAR

## 2023-08-01 NOTE — Progress Notes (Signed)
 Botox - 100 units x 2 vials Lot: [id number] Expiration: 09/2025 NDC: 9976-8854-98  Bacteriostatic 0.9% Sodium Chloride - 4 mL  Lot: [id number] Expiration:10/2024 NDC: 9590-8033-97  Dx: G43.709 S/P  Witnessed by Particia PEAK

## 2023-08-01 NOTE — Progress Notes (Signed)
 Consent Form Botulism Toxin Injection For Chronic Migraine  08/01/2023: First infusion Vyepti 100mg  infusion 1st infusion was 04-27-2023. Patient feels that her migraines cause her jaw to ache in her jaw aching also can cause her migraines to worsen it is a trigger for migraines included units in each masseter to see if that helps with migraine severity.  She has received benefit from 100mg  Vyepti, 300mg  would be more beneficial/effective   05/08/2023: First infusion Vyepti 100mg  infusion 1st infusion 04-27-2023. Patient feels that her migraines cause her jaw to ache in her jaw aching also can cause her migraines to worsen it is a trigger for migraines included units in each masseter to see if that helps with migraine severity.   02/09/2023:  Baseline >30 daily headaches headache days a month and >15-20 migraine days a month. Excellent response, she has > 50% reduction in migraines and headache frequ and severity monthly but still with a burden of > 15 days a month and 8 migraine days a month. Will try to get Vyepti approved to layer on top of Botox .    11/19/2022:  Baseline >30 daily headaches headache days a month and >15-20 migraine days a month. Excellent response, she has > 50% reduction in migraines and headache frequ and severity monthly. Discussed acute management prescribed frova , zembrace works quickly but the migraines rebound. Did great on Nurtec and we prescribed and she continues to take(ubrelvy  does not work as well). Patient felt her forehead and eyebrows were uncomfortable due to inability to move them, reduced injections 1/2 injections in the corrigators and procerus, did the frontalis very high and was much better. She feels clenchings in her masseters and pterygoids make her migraines worse has tried muscle relaxer at bedtime will put 10 units in each masseter for follow the pain. She also has temple pain.   Dry needling gave her a very bad migraine will not order that again. A  No  orders of the defined types were placed in this encounter. 08/18/2022:stable 02/22/2022: Stable  12/05/2021: She is doing excellent. On Botox  migraines have been reduced to 4 migraine days a month and <10 total headache days a month. Exceleent response.  For migraines that come on quickly will prescribe zavzpret .  09/07/2021: On Botox  migraines have been reduced to 4 migraine days a month and <10 total headache days a month. Exceleent response.   06/08/2021: Baseline 15 headache days a month and 10 migraine days a month. Excellent response, she has > 75% reduction in migraines and headaches monthlyPatient felt her forehead and eyebrows were uncomfortable due to inability to move them, reduced injections 1/2 injections in the corrigators and procerus, did the frontalis very high and was much better. 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 She also has temple pain.   Dry needling gave her a very bad migraine will not order that again. Aimovig  gave her constipation and we stopped due to insurance. Doing so well doesn't even need to consider aimovig * at this time 03/16/2021: She had to put her dog down at [place] Velma was 16. She had a very bad migraine.  When she has a migraine we use Ubrelvy . Try getting her aimovig   09/15/2020: Baseline >15 headache days a month and >10 migraine days a month. Excellent response, she has > 75% reduction in migraines and headaches monthly. Discussed acute management prescribed frova , zembrace works quickly but the migraines rebound. Did great on Nurtec and we prescribed  and she continues to take. Patient felt her forehead and eyebrows were uncomfortable due to inability to move them, reduced injections 1/2 injections in the corrigators and procerus, did the frontalis very high and was much better. 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 She also has temple pain.    Dry needling gave her a very bad migraine will not order that again. Aimovig  gave her constipation and we stopped due to insurance. Doing so well doesn't even need to consider aimovig * at this time   Reviewed orally with patient, additionally signature is on file:  Botulism toxin has been approved by the Federal drug administration for treatment of chronic migraine. Botulism toxin does not cure chronic migraine and it may not be effective in some patients.  The administration of botulism toxin is accomplished by injecting a small amount of toxin into the muscles of the neck and head. Dosage must be titrated for each individual. Any benefits resulting from botulism toxin tend to wear off after 3 months with a repeat injection required if benefit is to be maintained. Injections are usually done every 3-4 months with maximum effect peak achieved by about 2 or 3 weeks. Botulism toxin is expensive and you should be sure of what costs you will incur resulting from the injection.  The side effects of botulism toxin use for chronic migraine may include:   -Transient, and usually mild, facial weakness with facial injections  -Transient, and usually mild, head or neck weakness with head/neck injections  -Reduction or loss of forehead facial animation due to forehead muscle weakness  -Eyelid drooping  -Dry eye  -Pain at the site of injection or bruising at the site of injection  -Double vision  -Potential unknown long term risks  Contraindications: You should not have Botox  if you are pregnant, nursing, allergic to albumin, have an infection, skin condition, or muscle weakness at the site of the injection, or have myasthenia gravis, Lambert-Eaton syndrome, or ALS.  It is also possible that as with any injection, there may be an allergic reaction or no effect from the medication. Reduced effectiveness after repeated injections is sometimes seen and rarely infection at the injection site may occur. All care  will be taken to prevent these side effects. If therapy is given over a long time, atrophy and wasting in the muscle injected may occur. Occasionally the patient's become refractory to treatment because they develop antibodies to the toxin. In this event, therapy needs to be modified.  I have read the above information and consent to the administration of botulism toxin.    BOTOX  PROCEDURE NOTE FOR MIGRAINE HEADACHE    Contraindications and precautions discussed with patient(above). Aseptic procedure was observed and patient tolerated procedure. Procedure performed by Dr. Andree Epp  The condition has existed for more than 6 months, and pt does not have a diagnosis of ALS, Myasthenia Gravis or Lambert-Eaton Syndrome.  Risks and benefits of injections discussed and pt agrees to proceed with the procedure.  Written consent obtained  These injections are medically necessary. Pt  receives good benefits from these injections. These injections do not cause sedations or hallucinations which the oral therapies may cause.  Indication/Diagnosis: chronic migraine BOTOX (G9414) injection was performed according to protocol by [employer]. 200 units of BOTOX  was dissolved into 4 cc NS.   NDC: [address]-98   Description of procedure:  The patient was placed in a sitting position. The standard protocol was used for Botox  as  follows, with 5 units of Botox  injected at each site:   -Procerus muscle, midline injection 2.5 units   -Corrugator muscle, bilateral injection 2.5 units each  -Frontalis muscle, bilateral injection, with 2 sites each side, medial injection was performed in the upper one third of the frontalis muscle, in the region vertical from the medial inferior edge of the superior orbital rim. The lateral injection was again in the upper one third of the forehead vertically above the lateral limbus of the cornea, 1.5 cm lateral to the medial injection site.  -Temporalis muscle injection, 4  sites, bilaterally. The first injection was 3 cm above the tragus of the ear, second injection site was 1.5 cm to 3 cm up from the first injection site in line with the tragus of the ear. The third injection site was 1.5-3 cm forward between the first 2 injection sites. The fourth injection site was 1.5 cm posterior to the second injection site. .  -Occipitalis muscle injection, 3 sites, bilaterally. The first injection was done one half way between the occipital protuberance and the tip of the mastoid process behind the ear. The second injection site was done lateral and superior to the first, 1 fingerbreadth from the first injection. The third injection site was 1 fingerbreadth superiorly and medially from the first injection site.  -Cervical paraspinal muscle injection, 2 sites, bilateral knee first injection site was 1 cm from the midline of the cervical spine, 3 cm inferior to the lower border of the occipital protuberance. The second injection site was 1.5 cm superiorly and laterally to the first injection site.  -Trapezius muscle injection was performed at 3 sites, bilaterally. The first injection site was in the upper trapezius muscle halfway between the inflection point of the neck, and the acromion. The second injection site was one half way between the acromion and the first injection site. The third injection was done between the first injection site and the inflection point of the neck.   Will return for repeat injection in 3 months.   165 unit sof Botox  was used, 35 Botox  was wasted. The patient tolerated the procedure well, there were no complications of the above procedure.

## 2023-08-03 ENCOUNTER — Ambulatory Visit: Admitting: Physical Therapy

## 2023-08-07 NOTE — Therapy (Signed)
 OUTPATIENT PHYSICAL THERAPY LOWER EXTREMITY TREATMENT    Patient Name: Margaret Lopez MRN: [id number] DOB:Sep 13, 1970, 53 y.o., female Today's Date: 08/08/2023  END OF SESSION:  PT End of Session - 08/08/23 1715     Visit Number 4    Number of Visits 13    Date for PT Re-Evaluation 08/31/23    Authorization Type [employer]    Authorization Time Period 07/06/23 to 08/31/23    PT Start Time 1715    PT Stop Time 1800    PT Time Calculation (min) 45 min    Activity Tolerance Patient tolerated treatment well    Behavior During Therapy [facility] for tasks assessed/performed            Past Medical History:  Diagnosis Date   Anxiety    Asthma    Bipolar disorder (HCC)    Depression    Deviated nasal septum 02/2011   Generalized anxiety disorder 04/16/2013   GERD (gastroesophageal reflux disease)    Headache(784.0)    migraines   Insomnia    Nasal turbinate hypertrophy 02/2011   bilat.   Neuromuscular disorder (HCC)    Dr. CHRISTELLA. Oneita- does injections around nerve in her head for prevention of migraines - q 3 months    Pneumonia    hx of    PONV (postoperative nausea and vomiting)    Seasonal allergies    Past Surgical History:  Procedure Laterality Date   ABDOMINAL HYSTERECTOMY     CARPAL TUNNEL RELEASE Right    DIAGNOSTIC LAPAROSCOPY     EXCISION MASS LOWER EXTREMETIES Left 06/21/2018   Procedure: Excision of left upper leg fat necrosis;  Surgeon: Lowery Estefana RAMAN, DO;  Location: [facility];  Service: Plastics;  Laterality: Left;   FOOT SURGERY Right    bone removed from great toe    GASTRIC ROUX-EN-Y N/A 05/16/2016   Procedure: LAPAROSCOPIC ROUX-EN-Y GASTRIC BYPASS WITH UPPER ENDOSCOPY;  Surgeon: Signe Mitzie LABOR, MD;  Location: [facility];  Service: General;  Laterality: N/A;   LAPAROSCOPIC VAGINAL HYSTERECTOMY  03/14/2007   LIPOMA EXCISION Left 08/26/2016   Procedure: EXCISION LIPOMA FROM LEFT UPPER POSTERIOR THIGH;  Surgeon: Signe Mitzie LABOR, MD;  Location:  [facility];  Service: General;  Laterality: Left;   MYRINGOTOMY WITH TUBE PLACEMENT Bilateral 03/02/2015   Procedure: MYRINGOTOMY WITH T-TUBE PLACEMENT;  Surgeon: Daniel Moccasin, MD;  Location: [facility];  Service: ENT;  Laterality: Bilateral;   NASAL SEPTOPLASTY W/ TURBINOPLASTY  02/22/2011   Procedure: NASAL SEPTOPLASTY WITH TURBINATE REDUCTION;  Surgeon: Ana LELON Moccasin, MD;  Location: [facility];  Service: ENT;  Laterality: Bilateral;   TUBAL LIGATION  12/16/2004   TYMPANOSTOMY TUBE PLACEMENT Bilateral 07/21/2017   Patient Active Problem List   Diagnosis Date Noted   Chronic migraine without aura without status migrainosus, not intractable 12/05/2021   Migraine without aura and without status migrainosus, not intractable 12/05/2021   ERRONEOUS ENCOUNTER--DISREGARD 04/15/2021   Hematoma of leg 03/02/2018   Chronic migraine without aura, with intractable migraine, so stated, with status migrainosus 06/18/2017   GAD (generalized anxiety disorder) 04/16/2013   Bipolar 1 disorder, depressed (HCC) 04/16/2013   Generalized anxiety disorder 04/16/2013    PCP: Signa Rush MD   REFERRING PROVIDER: Alys Schuyler CHRISTELLA, PA  REFERRING DIAG: B hip pain   THERAPY DIAG:  Pain in right hip  Pain in left hip  Muscle weakness (generalized)  Other symptoms and signs involving the musculoskeletal system  Rationale for  Evaluation and Treatment: Rehabilitation  ONSET DATE: several months   SUBJECTIVE:   SUBJECTIVE STATEMENT:  I am doing alright. Some pain in my back and hip but not too bad. I have been doing the exercises.   EVAL: This has been happening for awhile, when I walk a lot they really stiffen up. 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 The last time I was here was when I got my leg stuck between a street sign and a trailer tire and the muscles got ripped off the bone. It doesn't seem like these things are related. Its hard to lift legs up to get in the car. Low back is  tight but doesn't seem to be related to this issue.   PERTINENT HISTORY: See above  PAIN:  Are you having pain? No 0/10 just tight   PRECAUTIONS: None  RED FLAGS: None   WEIGHT BEARING RESTRICTIONS: No  FALLS:  Has patient fallen in last 6 months? 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    PATIENT SURVEYS:   LEFS 31/80  COGNITION: Overall cognitive status: Within functional limits for tasks assessed     SENSATION: Not tested    MUSCLE LENGTH:  HS mod limitation B  Piriformis OK  POSTURE: rounded shoulders and forward head    ROM:  Lumbar flexion 25% limited Lumbar extension WNL Lumbar lateral flexion WNL  Lumbar rotation 30% limited B  Hip flexion, IR, ER PROM OK     LOWER EXTREMITY MMT:  MMT Right eval Left eval  Hip flexion 3 3  Hip extension 3- 3-  Hip abduction 3 3  Hip adduction    Hip internal rotation    Hip external rotation    Knee flexion 4- 4-  Knee extension 4 4  Ankle dorsiflexion    Ankle plantarflexion    Ankle inversion    Ankle eversion     (Blank rows = not tested)  LOWER EXTREMITY SPECIAL TESTS:   FABER (+) L, (-) R  FADIR OK B  Scour test (-) L, (+) R                                                                                                                                     TREATMENT DATE:  08/08/23 NuStep L4 x60mins  Passive supine LE  stretches- HS, glutes, piriformis, LTR, SKTC  Bridge 2x10 Feet on pball  rotations and knees to chest x10 Single knee bent fall out green band 2x10 Lateral band steps  STS 2x10   07/27/23  Nustep L4x8 minutes seat 8, BLEs only  DKTC with towel behind knees 6x5 seconds  Figure 4 stretch 2x30 seconds B  HS stretch 2x30 seconds B Quadruped green TB hip ABD x10 B Quadruped green TB hip extensions/knee bent x10 B Hip hikes x12 B    07/20/23 Nustep L 4 LE only ADD ball squeeze 20 x Green tband clams 20x Green tband hip flex alt 20  Red tband standing BIL 3 ways 12 x each Wt ball STS 10 x Feet on ball bridge, KTC and abd Feet on ball alt SLR 20 x PROM and stretching BIL LE and trunk- BIL tight HS and LEft side tighter in general vs RT   07/06/23  Eval, POC, HEP     PATIENT EDUCATION:  Education details: exam findings, POC, HEP; if hip pain does not improve with skilled PT may need to refer back to MD for further w/u, suspicion for some potential rheumatological involvement  Person educated: Patient Education method: Explanation, Demonstration, and Handouts Education comprehension: verbalized understanding, returned demonstration, and needs further education  HOME EXERCISE PROGRAM: Access Code: [email or web address] URL: [email or web address] Date: 07/20/2023 Prepared by: Angela Payseur  Exercises - Standing Hip Flexion with Resistance Loop  - 1 x daily - 7 x weekly - 2 sets - 10 reps - Hip Abduction with Resistance Loop  - 1 x daily - 7 x weekly - 2 sets - 10 reps - Hip Extension with Resistance Loop  - 1 x daily - 7 x weekly - 2 sets - 10 reps - Seated Table Hamstring Stretch  - 2-3 x daily - 7 x weekly - 2 sets - 3 reps - 30 hold  Access Code: [email or web address] URL: [email or web address] Date: 07/06/2023 Prepared by: Josette Rough  Exercises - Supine Single Knee to Chest Stretch  - 1-2 x daily - 7 x weekly - 1 sets - 10 reps - 5 seconds  hold -  Supine Lower Trunk Rotation  - 1-2 x daily - 7 x weekly - 1 sets - 10 reps - 5 seconds  hold - Supine Bridge  - 1 x daily - 5 x weekly - 2 sets - 10 reps - 1 second  hold - Seated Hip Abduction with Resistance  - 1 x daily - 5 x weekly - 2 sets - 10 reps - 1 second  hold  ASSESSMENT:  CLINICAL IMPRESSION:   Patient remains compliant with HEP. Has been doing well, but over did some of the exercises and had pain. Worked on functional strength and lumbar/hip mobility; tolerated session well. Pt reports some swelling in her ankles. May be from prolonged sitting for work, her doctor has put her on a diuretic.    EVAL: Patient is a 53 y.o. F who was seen today for physical therapy evaluation and treatment for B hip pain. Her hip pain started insidiously and has gotten worse over the past few months. Objectives as above. Bilateral joint pain starting at the same time with some reports of lumbar stiffness does make me question if there could possible be rheumatological involvement, we will monitor. We will make every effort to improve pain and function moving forward.    OBJECTIVE IMPAIRMENTS: Abnormal gait, decreased activity tolerance, decreased mobility, difficulty walking, decreased strength, impaired flexibility, and pain.   ACTIVITY LIMITATIONS: sitting, standing, squatting, sleeping, stairs, transfers, bed mobility, and  locomotion level  PARTICIPATION LIMITATIONS: driving, shopping, community activity, occupation, and yard work  PERSONAL FACTORS: Age, Behavior pattern, Fitness, Past/current experiences, and Time since onset of injury/illness/exacerbation are also affecting patient's functional outcome.   REHAB POTENTIAL: Fair sedentary lifestyle, chronicity of pain, question possible rheumatological involvement complicating PT    CLINICAL DECISION MAKING: Stable/uncomplicated  EVALUATION COMPLEXITY: Low   GOALS: Goals reviewed with patient? No  SHORT TERM GOALS: Target date: 07/27/2023    Will be compliant with appropriate progressive HEP  Baseline: Goal status: MET 07/20/23  2.  Lumbar AROM to have normalized  Baseline:  Goal status: 07/20/23 progressing WLS minus flexion limited 40%- HS tightness  3.  HS flexibility to be no more than 25% limited B  Baseline:  Goal status: MET 07/20/23   4.  Pain with extended sitting or standing/walking to be no more than 6/10 Baseline:  Goal status: 07/20/23 progressing    LONG TERM GOALS: Target date: 08/17/2023    MMT to have improved by at least one grade in all weak groups  Baseline:  Goal status: INITIAL  2.  Pain with all functional extended tasks (sitting, walking, standing) to be no more than 3/10 at worst  Baseline:  Goal status: INITIAL  3.  Will be able to perform car transfers with hip pain no more than 3/10 Baseline:  Goal status: INITIAL  4.  LEFS to have improved by at least 20 points  Baseline:  Goal status: INITIAL     PLAN:  PT FREQUENCY: 1-2x/week  PT DURATION: 6 weeks  PLANNED INTERVENTIONS: 97750- Physical Performance Testing, 97110-Therapeutic exercises, 97530- Therapeutic activity, 97112- Neuromuscular re-education, 97535- Self Care, 02859- Manual therapy, [phone]- Gait training, and [phone]- Ultrasound  PLAN FOR NEXT SESSION: assess and progress, update objectives next visit   monitor for worsening sx that might further indicate rheumatological involvement/warrant referral back to MD, otherwise lumbar ROM, hip and core strength, could try US  if pain persists   Josette Rough, PT, DPT 08/08/23 6:05 PM

## 2023-08-08 ENCOUNTER — Ambulatory Visit

## 2023-08-08 DIAGNOSIS — M25552 Pain in left hip: Secondary | ICD-10-CM

## 2023-08-08 DIAGNOSIS — M6281 Muscle weakness (generalized): Secondary | ICD-10-CM

## 2023-08-08 DIAGNOSIS — M25551 Pain in right hip: Secondary | ICD-10-CM | POA: Diagnosis not present

## 2023-08-08 DIAGNOSIS — R29898 Other symptoms and signs involving the musculoskeletal system: Secondary | ICD-10-CM

## 2023-08-10 ENCOUNTER — Encounter: Payer: Self-pay | Admitting: Physical Therapy

## 2023-08-10 ENCOUNTER — Ambulatory Visit: Admitting: Physical Therapy

## 2023-08-10 DIAGNOSIS — M25551 Pain in right hip: Secondary | ICD-10-CM

## 2023-08-10 DIAGNOSIS — M6281 Muscle weakness (generalized): Secondary | ICD-10-CM

## 2023-08-10 DIAGNOSIS — R29898 Other symptoms and signs involving the musculoskeletal system: Secondary | ICD-10-CM

## 2023-08-10 DIAGNOSIS — M25552 Pain in left hip: Secondary | ICD-10-CM

## 2023-08-10 NOTE — Therapy (Signed)
 OUTPATIENT PHYSICAL THERAPY LOWER EXTREMITY TREATMENT    Patient Name: Margaret Lopez MRN: [id number] DOB:November 22, 1970, 53 y.o., female Today's Date: 08/10/2023  END OF SESSION:  PT End of Session - 08/10/23 1134     Visit Number 5    Number of Visits 13    Date for PT Re-Evaluation 08/31/23    Authorization Type [employer]    Authorization Time Period 07/06/23 to 08/31/23    PT Start Time 1105    PT Stop Time 1143    PT Time Calculation (min) 38 min    Activity Tolerance Patient tolerated treatment well    Behavior During Therapy [facility] for tasks assessed/performed             Past Medical History:  Diagnosis Date   Anxiety    Asthma    Bipolar disorder (HCC)    Depression    Deviated nasal septum 02/2011   Generalized anxiety disorder 04/16/2013   GERD (gastroesophageal reflux disease)    Headache(784.0)    migraines   Insomnia    Nasal turbinate hypertrophy 02/2011   bilat.   Neuromuscular disorder (HCC)    Dr. CHRISTELLA. Oneita- does injections around nerve in her head for prevention of migraines - q 3 months    Pneumonia    hx of    PONV (postoperative nausea and vomiting)    Seasonal allergies    Past Surgical History:  Procedure Laterality Date   ABDOMINAL HYSTERECTOMY     CARPAL TUNNEL RELEASE Right    DIAGNOSTIC LAPAROSCOPY     EXCISION MASS LOWER EXTREMETIES Left 06/21/2018   Procedure: Excision of left upper leg fat necrosis;  Surgeon: Lowery Estefana RAMAN, DO;  Location: [facility];  Service: Plastics;  Laterality: Left;   FOOT SURGERY Right    bone removed from great toe    GASTRIC ROUX-EN-Y N/A 05/16/2016   Procedure: LAPAROSCOPIC ROUX-EN-Y GASTRIC BYPASS WITH UPPER ENDOSCOPY;  Surgeon: Signe Mitzie LABOR, MD;  Location: [facility];  Service: General;  Laterality: N/A;   LAPAROSCOPIC VAGINAL HYSTERECTOMY  03/14/2007   LIPOMA EXCISION Left 08/26/2016   Procedure: EXCISION LIPOMA FROM LEFT UPPER POSTERIOR THIGH;  Surgeon: Signe Mitzie LABOR, MD;   Location: [facility];  Service: General;  Laterality: Left;   MYRINGOTOMY WITH TUBE PLACEMENT Bilateral 03/02/2015   Procedure: MYRINGOTOMY WITH T-TUBE PLACEMENT;  Surgeon: Daniel Moccasin, MD;  Location: [facility];  Service: ENT;  Laterality: Bilateral;   NASAL SEPTOPLASTY W/ TURBINOPLASTY  02/22/2011   Procedure: NASAL SEPTOPLASTY WITH TURBINATE REDUCTION;  Surgeon: Ana LELON Moccasin, MD;  Location: [facility];  Service: ENT;  Laterality: Bilateral;   TUBAL LIGATION  12/16/2004   TYMPANOSTOMY TUBE PLACEMENT Bilateral 07/21/2017   Patient Active Problem List   Diagnosis Date Noted   Chronic migraine without aura without status migrainosus, not intractable 12/05/2021   Migraine without aura and without status migrainosus, not intractable 12/05/2021   ERRONEOUS ENCOUNTER--DISREGARD 04/15/2021   Hematoma of leg 03/02/2018   Chronic migraine without aura, with intractable migraine, so stated, with status migrainosus 06/18/2017   GAD (generalized anxiety disorder) 04/16/2013   Bipolar 1 disorder, depressed (HCC) 04/16/2013   Generalized anxiety disorder 04/16/2013    PCP: Signa Rush MD   REFERRING PROVIDER: Alys Schuyler CHRISTELLA, PA  REFERRING DIAG: B hip pain   THERAPY DIAG:  Pain in right hip  Pain in left hip  Muscle weakness (generalized)  Other symptoms and signs involving the musculoskeletal system  Rationale  for Evaluation and Treatment: Rehabilitation  ONSET DATE: several months   SUBJECTIVE:   SUBJECTIVE STATEMENT:  OK, one hip loosened up. One bothered me the other day but its OK now, trending the right direction  EVAL: This has been happening for awhile, when I walk a lot they really stiffen up. 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 The last time I was here was when I got my leg stuck between a street sign and a trailer tire and the muscles got ripped off the bone. It doesn't seem like these things are related. Its hard to lift legs up to get in the car. Low  back is tight but doesn't seem to be related to this issue.   PERTINENT HISTORY: See above  PAIN:  Are you having pain? Yes: NPRS scale: 2/10 Pain location: L>R hips  Pain description: soreness and tightness  Aggravating factors: repeated movement, stairs, sitting for long periods of time Relieving factors: lying down  PRECAUTIONS: None  RED FLAGS: None   WEIGHT BEARING RESTRICTIONS: No  FALLS:  Has patient fallen in last 6 months? 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    PATIENT SURVEYS:   LEFS 31/80  COGNITION: Overall cognitive status: Within functional limits for tasks assessed     SENSATION: Not tested    MUSCLE LENGTH:  HS mod limitation B  Piriformis OK  POSTURE: rounded shoulders and forward head    ROM:  Lumbar flexion 25% limited Lumbar extension WNL Lumbar lateral flexion WNL  Lumbar rotation 30% limited B  Hip flexion, IR, ER PROM OK     LOWER EXTREMITY MMT:  MMT Right eval Left eval Right 08/10/23 Left 08/10/23  Hip flexion 3 3 3 3   Hip extension 3- 3-    Hip abduction 3 3 3 3   Hip adduction      Hip internal rotation      Hip external rotation      Knee flexion 4- 4- 4 4  Knee extension 4 4 4 4   Ankle dorsiflexion      Ankle plantarflexion      Ankle inversion      Ankle eversion       (Blank rows = not tested)  LOWER EXTREMITY SPECIAL TESTS:   FABER (+) L,  (-) R  FADIR OK B  Scour test (-) L, (+) R  TREATMENT DATE:   08/10/23  Scifit bike L4x8 min for w/u  MMT  Bridge + ABD into green TB x15 Sidelying hip ABD x12 B green TB Prone hip extensions green TB x10 B Walking bridges x5 STS + green TB x12  Hip hikes x15 B Hip hikes + ABD x10  Hip hikes + swing x10 B Hip circles for mobility x10 CW/x10 CCW B     08/08/23 NuStep L4 x38mins  Passive supine LE stretches- HS, glutes, piriformis, LTR, SKTC  Bridge 2x10 Feet on pball rotations and knees to chest x10 Single knee bent fall out green band 2x10 Lateral band steps  STS 2x10   07/27/23  Nustep L4x8 minutes seat 8, BLEs only  DKTC with towel behind knees 6x5 seconds  Figure 4 stretch 2x30 seconds B  HS stretch 2x30 seconds B Quadruped green TB hip ABD x10 B Quadruped green TB hip extensions/knee bent x10 B Hip hikes x12 B    07/20/23 Nustep L 4 LE only ADD ball squeeze 20 x Green tband clams 20x Green tband hip flex alt 20  Red tband standing BIL 3 ways 12 x each Wt ball STS 10 x Feet on ball bridge, KTC and abd Feet on ball alt SLR 20 x PROM and stretching BIL LE and trunk- BIL tight HS and LEft side tighter in general vs RT   07/06/23  Eval, POC, HEP     PATIENT EDUCATION:  Education details: exam findings, POC, HEP; if hip pain does not improve with skilled PT may need to refer back to MD for further w/u, suspicion for some potential rheumatological involvement  Person educated: Patient Education method: Explanation, Demonstration, and Handouts Education comprehension: verbalized understanding, returned demonstration, and needs further education  HOME EXERCISE PROGRAM: Access Code: [email or web address] URL: [email or web address] Date: 07/20/2023 Prepared by: Angela Payseur  Exercises - Standing Hip Flexion  with Resistance Loop  - 1 x daily - 7 x weekly - 2 sets - 10 reps - Hip Abduction with Resistance Loop  - 1 x daily - 7 x weekly - 2 sets - 10 reps - Hip Extension with Resistance Loop  - 1 x daily - 7 x weekly - 2 sets - 10 reps - Seated Table Hamstring Stretch  - 2-3 x daily - 7 x weekly - 2 sets - 3 reps - 30 hold  Access Code: [email or web address] URL: [email or web address] Date: 07/06/2023 Prepared by: Josette Rough  Exercises - Supine Single Knee to Chest Stretch  - 1-2 x daily - 7 x weekly - 1 sets - 10 reps - 5 seconds  hold - Supine Lower Trunk Rotation  - 1-2 x daily - 7 x weekly - 1 sets - 10 reps - 5 seconds  hold - Supine Bridge  - 1 x daily - 5 x weekly - 2 sets - 10 reps - 1 second  hold - Seated Hip Abduction with Resistance  - 1 x daily - 5 x weekly - 2 sets - 10 reps - 1 second  hold  ASSESSMENT:  CLINICAL IMPRESSION:     Doing OK, seems to be trending a little better. Focused on functional strengthening today, had more difficulty with L hip today in general with challenging tasks. MMT updated. Will continue to progress as tolerated/able.    EVAL: Patient is a 53 y.o. F who was seen today for physical therapy evaluation and treatment for B hip pain. Her hip pain started insidiously and has gotten worse over the past  few months. Objectives as above. Bilateral joint pain starting at the same time with some reports of lumbar stiffness does make me question if there could possible be rheumatological involvement, we will monitor. We will make every effort to improve pain and function moving forward.    OBJECTIVE IMPAIRMENTS: Abnormal gait, decreased activity tolerance, decreased mobility, difficulty walking, decreased strength, impaired flexibility, and pain.   ACTIVITY LIMITATIONS: sitting, standing, squatting, sleeping, stairs, transfers, bed mobility, and locomotion level  PARTICIPATION LIMITATIONS: driving, shopping, community activity, occupation, and yard  work  PERSONAL FACTORS: Age, Behavior pattern, Fitness, Past/current experiences, and Time since onset of injury/illness/exacerbation are also affecting patient's functional outcome.   REHAB POTENTIAL: Fair sedentary lifestyle, chronicity of pain, question possible rheumatological involvement complicating PT    CLINICAL DECISION MAKING: Stable/uncomplicated  EVALUATION COMPLEXITY: Low   GOALS: Goals reviewed with patient? No  SHORT TERM GOALS: Target date: 07/27/2023   Will be compliant with appropriate progressive HEP  Baseline: Goal status: MET 07/20/23  2.  Lumbar AROM to have normalized  Baseline:  Goal status: 07/20/23 progressing WLS minus flexion limited 40%- HS tightness  3.  HS flexibility to be no more than 25% limited B  Baseline:  Goal status: MET 07/20/23   4.  Pain with extended sitting or standing/walking to be no more than 6/10 Baseline:  Goal status: 07/20/23 progressing    LONG TERM GOALS: Target date: 08/17/2023    MMT to have improved by at least one grade in all weak groups  Baseline:  Goal status: INITIAL  2.  Pain with all functional extended tasks (sitting, walking, standing) to be no more than 3/10 at worst  Baseline:  Goal status: INITIAL  3.  Will be able to perform car transfers with hip pain no more than 3/10 Baseline:  Goal status: INITIAL  4.  LEFS to have improved by at least 20 points  Baseline:  Goal status: INITIAL     PLAN:  PT FREQUENCY: 1-2x/week  PT DURATION: 6 weeks  PLANNED INTERVENTIONS: 97750- Physical Performance Testing, 97110-Therapeutic exercises, 97530- Therapeutic activity, 97112- Neuromuscular re-education, 97535- Self Care, 02859- Manual therapy, [phone]- Gait training, and [phone]- Ultrasound  PLAN FOR NEXT SESSION: continue working on functional strength progressions keeping old L LE injury in mind   monitor for worsening sx that might further indicate rheumatological involvement/warrant referral back to MD,  otherwise lumbar ROM, hip and core strength, could try US  if pain persists   Josette Rough, PT, DPT 08/10/23 11:44 AM

## 2023-08-15 ENCOUNTER — Other Ambulatory Visit (HOSPITAL_COMMUNITY): Payer: Self-pay

## 2023-08-15 DIAGNOSIS — F319 Bipolar disorder, unspecified: Secondary | ICD-10-CM

## 2023-08-15 MED ORDER — LITHIUM CARBONATE ER 300 MG PO TBCR: 600.0000 mg | EXTENDED_RELEASE_TABLET | Freq: Every evening | ORAL | 0 refills | Status: DC

## 2023-08-17 ENCOUNTER — Ambulatory Visit: Admitting: Physical Therapy

## 2023-08-17 ENCOUNTER — Telehealth: Payer: Self-pay | Admitting: *Deleted

## 2023-08-17 NOTE — Telephone Encounter (Signed)
 Received fax nurses notes for pt that received vyepti 100mg /  08-10-2023. [facility] RN .   Tolerated the infusion well.  [facility] [phone], fax [phone].

## 2023-08-22 ENCOUNTER — Ambulatory Visit

## 2023-08-23 NOTE — Telephone Encounter (Signed)
 Next visit 11-02-2023 at 0900.

## 2023-08-29 ENCOUNTER — Telehealth: Payer: Self-pay | Admitting: *Deleted

## 2023-08-29 NOTE — Telephone Encounter (Signed)
 [place] with [facility] asking about increase in vyepti to 300mg .  Pts next dose is due 10/2023.  It does make mention in last visit here with BOTOX  08-01-2023. She has not had a med management appt since 2022. Will need to make one can be [facility].

## 2023-08-31 ENCOUNTER — Other Ambulatory Visit: Payer: Self-pay

## 2023-08-31 ENCOUNTER — Other Ambulatory Visit (HOSPITAL_BASED_OUTPATIENT_CLINIC_OR_DEPARTMENT_OTHER): Payer: Self-pay

## 2023-08-31 MED ORDER — FLUOCINONIDE 0.05 % EX CREA
1.0000 | TOPICAL_CREAM | Freq: Two times a day (BID) | CUTANEOUS | 0 refills | Status: AC
  Filled 2023-08-31: qty 60, 30d supply, fill #0

## 2023-08-31 MED ORDER — ZEPBOUND 5 MG/0.5ML ~~LOC~~ SOAJ
5.0000 mg | SUBCUTANEOUS | 0 refills | Status: DC
  Filled 2023-08-31: qty 2, 28d supply, fill #0

## 2023-09-03 NOTE — Progress Notes (Unsigned)
 PATIENT: Margaret Lopez DOB: 1970/01/17  REASON FOR VISIT: follow up HISTORY FROM: patient  Virtual Visit via Video Note  I connected with Grayce JONETTA Dawn on 09/04/23 at  1:00 PM EDT by a video enabled telemedicine application located remotely at [facility] and verified that I am speaking with the correct person using two identifiers who was located at their own home in [place]   I discussed the limitations of evaluation and management by telemedicine and the availability of in person appointments. The patient expressed understanding and agreed to proceed.   PATIENT: Margaret Lopez DOB: 07/10/1970  REASON FOR VISIT: follow up HISTORY FROM: patient  HISTORY OF PRESENT ILLNESS: Today 09/04/23:  Margaret Lopez is a 53 y.o. female with a history of Migraine headaches with and without aura. Returns today for follow-up.  Patient is currently taking Botox  and Vyepti 100 mg infusions.  She does find this beneficial but notes that she still has approximately 2 headaches a week.  With more severe headaches she has to use Toradol  otherwise Nurtec is beneficial.   Triggers: Stress and weather  Location: right side occpital to temporal region  Frequency: 2 headaches a week  Duration: when she takes nurtec resolves in 30 minutes. For a severe hadache she will toradol  injection Aura: spots in her vision with some headaches but not all of them Photophonia: yes Phonophobia: yes Nausea: yes Vomiting: yes- uses zofran   Numbness: no Weakness: no  Cardiac history: none   HISTORY (copied from Dr. Sharion note)  October 26, 2020: Patient has been coming to clinic regularly for Botox  injections.  We have tried multiple other medications with her over the years while injecting Botox .  She does extremely well on Botox  feels she still has 7 migraine breakthrough headaches a month. She is having breakthrough migraines. Yesterday she took nurtec(at onset) and naratriptan (45 minutes after  onset), zofran  but still had the migraine all day and today, it improved after about 4 hours but still has some lingering today. Botox  has been great at reducing overall headaches > 50% (baseline was 15 headache days and 10 migraine days) now having no headaches but still approx 7 migraine days a month and they can be 50% improved in severity she only gets 1 severe migraine(in the past 6-7 severe migraines a month) since starting botox . 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 A lot of times nurtec will work bring it down but not completely. Amerge helps and can tolerate during the day.    We discussed a plan:   Prevention: Discussed aimovig , try Qulipta or Ajovy or Emgality at next botox  appointment   ACUTE/EMERGENT Acute management: Ondansetron  4mg , amerge(can substitute zembrace or tosymra ) , ubrelvy . Remember you can take these all again in 2 hours OR Acute: Ondansetron  4mg , amerge(an substitute zembrace or tosymra ), nurtec. Can repeat Ondansetron  and amerge in 2 hours   Medications tried that can be used in migraine management include: Tylenol , Zonisamide , Cataflam , nerve blocks.flexeril , depakote , zofran , gabapentin , tizanidine, scopolamine , Imitrex  not effective, Maxalt chest pain, zoloft, prozac , phenergan knock her out and compazine  makes her drowsy, frovatriptan ,(insurance stopped) , paxil, blood pressure medications contraindicated due to her hypotension, reglan  helps, Topamax, aimovig , flexeril (at night), cambia , migranal , lamictal, reglan , medrol  dosepak, amerge,nurtec, zofran , compazine , tosymra , zonisamide , zembrace,    Patient complains of symptoms per HPI as well as the following symptoms: stress . Pertinent  negatives and positives per HPI. All others negative   REVIEW OF SYSTEMS: Out of a complete 14 system review of symptoms, the patient complains only of the following symptoms, and all other reviewed systems  are negative.  ALLERGIES: Allergies  Allergen Reactions   Doxazosin Nausea And Vomiting   Morphine  And Codeine Itching   Phenergan [Promethazine Hcl]      knocks out for several hours   Aloe Hives   Penicillins Rash    Has patient had a PCN reaction causing immediate rash, facial/tongue/throat swelling, SOB or lightheadedness with hypotension: No Has patient had a PCN reaction causing severe rash involving mucus membranes or skin necrosis: No Has patient had a PCN reaction that required hospitalization No Has patient had a PCN reaction occurring within the last 10 years: No If all of the above answers are NO, then may proceed with Cephalosporin use.    Sulfa Antibiotics Rash    HOME MEDICATIONS: Outpatient Medications Prior to Visit  Medication Sig Dispense Refill   albuterol  (PROVENTIL  HFA;VENTOLIN  HFA) 108 (90 Base) MCG/ACT inhaler Inhale 1-2 puffs into the lungs every 6 (six) hours as needed for wheezing or shortness of breath.     botulinum toxin Type A  (BOTOX ) 100 units SOLR injection Provider to inject 155 units into the muscles of the head and neck every 3 months. Discard remainder. 1 each 3   botulinum toxin Type A  (BOTOX ) 100 units SOLR injection Provider to inject 155 units into head and neck muscles every 12 weeks, discard remainder 2 each 3   cyclobenzaprine  (FLEXERIL ) 10 MG tablet TAKE 1/2 TO 1 TABLET(5 TO 10 MG) BY MOUTH AT BEDTIME AS NEEDED 30 tablet 14   divalproex  (DEPAKOTE  ER) 500 MG 24 hr tablet Take 3 tablets (1,500 mg total) by mouth daily. 180 tablet 0   famotidine  (PEPCID ) 20 MG tablet Take 1 tablet (20 mg total) by mouth 2 (two) times daily. 90 tablet 1   fluocinonide  cream (LIDEX ) 0.05 % Apply 1 Application topically to affected area 2 (two) times daily. 60 g 0   gabapentin  (NEURONTIN ) 100 MG capsule TAKE 1 CAPSULE BY MOUTH THREE TIMES A DAY AS NEEDED 90 capsule 6   hydrochlorothiazide  (HYDRODIURIL ) 25 MG tablet Take 1 tablet (25 mg total) by mouth in the  morning. 90 tablet 0   hydrOXYzine  (ATARAX /VISTARIL ) 25 MG tablet Take 25 mg by mouth at bedtime as needed for itching.  3   ipratropium (ATROVENT) 0.06 % nasal spray Place 2 sprays into both nostrils daily as needed. Allergies, runny nose.  4   ketorolac  (TORADOL ) 60 MG/2ML SOLN injection Inject 1-2ml (30-60mg ) intramuscularly at onset of migraine. May repeat in 6 hours. Max twice a day and 4 days per month. 10 mL 4   levocetirizine (XYZAL) 5 MG tablet Take 5 mg by mouth at bedtime.      levothyroxine (SYNTHROID) 25 MCG tablet Take 25 mcg by mouth every morning.     lithium  carbonate (LITHOBID ) 300 MG ER tablet Take 2 tablets (600 mg total) by mouth every evening. 60 tablet 0   methylPREDNISolone  (MEDROL  DOSEPAK) 4 MG TBPK tablet Take pills daily all together with food. Take the first dose (6 pills) as soon as possible. Take the rest each morning. For 6 days total 6-5-4-3-2-1. 21 tablet 1   methylPREDNISolone  (MEDROL  DOSEPAK) 4 MG TBPK tablet TAKE AS DIRECTED WITH FOOD 21 each 0   Multiple Vitamins-Minerals (OPURITY BYPASS OPTIMIZED) CHEW Chew 1 tablet by mouth daily.  naratriptan  (AMERGE) 2.5 MG tablet TAKE 1 TABLET BY MOUTH FOR HEADACHE OR MIGRAINE. DO NOT EXCEED 2 DOSES OF ANY TRIPTAN IN 1 DAY 10 tablet 6   ondansetron  (ZOFRAN -ODT) 8 MG disintegrating tablet Take 1 tablet (8 mg total) by mouth every 8 (eight) hours as needed for nausea or vomiting. 30 tablet 11   prochlorperazine  (COMPAZINE ) 10 MG tablet Take 1 tablet (10 mg total) by mouth 3 (three) times daily as needed for nausea or vomiting (migraine). MAY CAUSE SEDATION. 30 tablet 3   Rimegepant Sulfate (NURTEC) 75 MG TBDP Take 1 tablet (75 mg total) by mouth daily as needed. For migraines. Take as close to onset of migraine as possible. One daily maximum. 16 tablet 11   Semaglutide -Weight Management (WEGOVY ) 0.5 MG/0.5ML SOAJ Inject 0.5 mg into the skin once a week. 2 mL 0   Semaglutide -Weight Management (WEGOVY ) 1 MG/0.5ML SOAJ Inject 1  mg into the skin once a week. 2 mL 0   sodium chloride  0.9 % SOLN 100 mL with Eptinezumab-jjmr 100 MG/ML SOLN 100 mg Inject 100 mg into the vein every 3 (three) months. 100 mg 4   SUMAtriptan  (TOSYMRA ) 10 MG/ACT SOLN Place 10 mg into the nose every hour as needed. 2 each 0   SUMAtriptan  Succinate (ZEMBRACE SYMTOUCH ) 3 MG/0.5ML SOAJ Inject 3 mg into the skin every 1 hour as needed for migraine. Max 4 injections in one day. 12 mL 11   SYRINGE-NEEDLE, DISP, 3 ML (BD INTEGRA SYRINGE) 25G X 1 3 ML MISC ATTACH NEEDLE TO SYRINGE AND USE TO DRAW UP AND ADMINISTER TORADOL . DO NOT REUSE. 4 each 0   tirzepatide  (ZEPBOUND ) 2.5 MG/0.5ML Pen Inject 2.5 mg into the skin once a week. 2 mL 0   tirzepatide  (ZEPBOUND ) 5 MG/0.5ML Pen Inject 5 mg into the skin once a week. 2 mL 0   valACYclovir (VALTREX) 500 MG tablet Take 500 mg by mouth See admin instructions. Takes 500mg  daily for 5 days when she has a flare up.  3   Zavegepant HCl (ZAVZPRET ) 10 MG/ACT SOLN Place 1 spray into the nose daily as needed. PATIENT HAS COPAY CARD WILL BRING 6 each 11   Facility-Administered Medications Prior to Visit  Medication Dose Route Frequency Provider Last Rate Last Admin   botulinum toxin Type A  (BOTOX ) injection 100 Units  100 Units Intramuscular Once Ines Onetha NOVAK, MD       botulinum toxin Type A  (BOTOX ) injection 155 Units  155 Units Intramuscular Once Ines Onetha NOVAK, MD        PAST MEDICAL HISTORY: Past Medical History:  Diagnosis Date   Anxiety    Asthma    Bipolar disorder (HCC)    Depression    Deviated nasal septum 02/2011   Generalized anxiety disorder 04/16/2013   GERD (gastroesophageal reflux disease)    Headache(784.0)    migraines   Insomnia    Nasal turbinate hypertrophy 02/2011   bilat.   Neuromuscular disorder (HCC)    Dr. CHRISTELLA. Oneita- does injections around nerve in her head for prevention of migraines - q 3 months    Pneumonia    hx of    PONV (postoperative nausea and vomiting)    Seasonal  allergies     PAST SURGICAL HISTORY: Past Surgical History:  Procedure Laterality Date   ABDOMINAL HYSTERECTOMY     CARPAL TUNNEL RELEASE Right    DIAGNOSTIC LAPAROSCOPY     EXCISION MASS LOWER EXTREMETIES Left 06/21/2018   Procedure: Excision of left  upper leg fat necrosis;  Surgeon: Lowery Estefana RAMAN, DO;  Location: [facility];  Service: Plastics;  Laterality: Left;   FOOT SURGERY Right    bone removed from great toe    GASTRIC ROUX-EN-Y N/A 05/16/2016   Procedure: LAPAROSCOPIC ROUX-EN-Y GASTRIC BYPASS WITH UPPER ENDOSCOPY;  Surgeon: Signe Mitzie LABOR, MD;  Location: [facility];  Service: General;  Laterality: N/A;   LAPAROSCOPIC VAGINAL HYSTERECTOMY  03/14/2007   LIPOMA EXCISION Left 08/26/2016   Procedure: EXCISION LIPOMA FROM LEFT UPPER POSTERIOR THIGH;  Surgeon: Signe Mitzie LABOR, MD;  Location: [facility];  Service: General;  Laterality: Left;   MYRINGOTOMY WITH TUBE PLACEMENT Bilateral 03/02/2015   Procedure: MYRINGOTOMY WITH T-TUBE PLACEMENT;  Surgeon: Daniel Moccasin, MD;  Location: [facility];  Service: ENT;  Laterality: Bilateral;   NASAL SEPTOPLASTY W/ TURBINOPLASTY  02/22/2011   Procedure: NASAL SEPTOPLASTY WITH TURBINATE REDUCTION;  Surgeon: Ana LELON Moccasin, MD;  Location: [facility];  Service: ENT;  Laterality: Bilateral;   TUBAL LIGATION  12/16/2004   TYMPANOSTOMY TUBE PLACEMENT Bilateral 07/21/2017    FAMILY HISTORY: Family History  Problem Relation Age of Onset   ADD / ADHD Sister    Hypertension Mother    Sarcoidosis Mother    Rheum arthritis Mother    Hypertension Father    Heart Problems Father    Alcohol abuse Maternal Grandmother    Bipolar disorder Maternal Grandmother    Breast cancer Maternal Grandmother    Breast cancer Maternal Aunt    Heart Problems Other        dad's side of family   Suicidality Neg Hx     SOCIAL HISTORY: Social History   Socioeconomic History   Marital status: Legally Separated    Spouse name: Not on  file   Number of children: 2   Years of education: Not on file   Highest education level: Some college, no degree  Occupational History   Not on file  Tobacco Use   Smoking status: Former    Current packs/day: 0.00    Average packs/day: 0.5 packs/day for 4.0 years (2.0 ttl pk-yrs)    Types: Cigarettes    Start date: 11/12/2011    Quit date: 11/12/2015    Years since quitting: 7.8   Smokeless tobacco: Never  Vaping Use   Vaping status: Never Used  Substance and Sexual Activity   Alcohol use: No    Alcohol/week: 0.0 standard drinks of alcohol    Comment: once a month or less   Drug use: No   Sexual activity: Yes    Partners: Male    Birth control/protection: Surgical    Comment: Hysterectomy  Other Topics Concern   Not on file  Social History Narrative   Lives at home with fiance', his son, and her children come and go from the home (67 y.o. and 51 y.o.)   Right handed   Caffeine: 2 cups daily   Social Drivers of Health   Financial Resource Strain: Not on file  Food Insecurity: Not on file  Transportation Needs: Not on file  Physical Activity: Sufficiently Active (11/17/2016)   Exercise Vital Sign    Days of Exercise per Week: 5 days    Minutes of Exercise per Session: 40 min  Stress: No Stress Concern Present (11/17/2016)   [employer] of Occupational Health - Occupational Stress Questionnaire    Feeling of Stress : Only a little  Social Connections: Not on file  Intimate Partner Violence:  Not on file      PHYSICAL EXAM Generalized: Well developed, in no acute distress   Neurological examination  Mentation: Alert oriented to time, place, history taking. Follows all commands speech and language fluent Cranial nerve II-XII: Facial symmetry noted.   DIAGNOSTIC DATA (LABS, IMAGING, TESTING) - I reviewed patient records, labs, notes, testing and imaging myself where available.  Lab Results  Component Value Date   WBC 5.8 04/13/2023   HGB 12.5 04/13/2023    HCT 38.0 04/13/2023   MCV 96 04/13/2023   PLT 265 04/13/2023      Component Value Date/Time   NA 138 04/13/2023 1405   K 4.4 04/13/2023 1405   CL 98 04/13/2023 1405   CO2 26 04/13/2023 1405   GLUCOSE 150 (H) 04/13/2023 1405   GLUCOSE 97 11/24/2016 1134   BUN 12 04/13/2023 1405   CREATININE 0.93 04/13/2023 1405   CALCIUM 9.7 04/13/2023 1405   PROT 6.1 04/13/2023 1405   ALBUMIN 4.2 04/13/2023 1405   AST 14 04/13/2023 1405   ALT 5 04/13/2023 1405   ALKPHOS 90 04/13/2023 1405   BILITOT 0.2 04/13/2023 1405   GFRNONAA 85 04/30/2019 1637   GFRAA 98 04/30/2019 1637   No results found for: CHOL, HDL, LDLCALC, LDLDIRECT, TRIG, CHOLHDL No results found for: YHAJ8R No results found for: VITAMINB12 Lab Results  Component Value Date   TSH 4.990 (H) 04/13/2023      ASSESSMENT AND PLAN 53 y.o. year old female  has a past medical history of Anxiety, Asthma, Bipolar disorder (HCC), Depression, Deviated nasal septum (02/2011), Generalized anxiety disorder (04/16/2013), GERD (gastroesophageal reflux disease), Headache(784.0), Insomnia, Nasal turbinate hypertrophy (02/2011), Neuromuscular disorder (HCC), Pneumonia, PONV (postoperative nausea and vomiting), and Seasonal allergies. here with:  Migraine headaches with and without aura  -Continue Botox  injections with Dr. Darleen -Increase Vyepti to 300 mg every 3 months -Continue Nurtec for abortive therapy.  75 mg at the onset of a migraine.  Only 1 tablet in 24 hours   Duwaine Russell, MSN, NP-C 09/04/23, 3:23 PM [facility] [address] [place], [place] [address] [phone]  The patient's condition requires frequent monitoring and adjustments in the treatment plan, reflecting the ongoing complexity of care.  This provider is the continuing focal point for all needed services for this condition.

## 2023-09-04 ENCOUNTER — Telehealth: Admitting: Adult Health

## 2023-09-04 DIAGNOSIS — G43009 Migraine without aura, not intractable, without status migrainosus: Secondary | ICD-10-CM

## 2023-09-04 DIAGNOSIS — G43109 Migraine with aura, not intractable, without status migrainosus: Secondary | ICD-10-CM | POA: Diagnosis not present

## 2023-09-04 DIAGNOSIS — G43709 Chronic migraine without aura, not intractable, without status migrainosus: Secondary | ICD-10-CM

## 2023-09-04 DIAGNOSIS — G43E09 Chronic migraine with aura, not intractable, without status migrainosus: Secondary | ICD-10-CM

## 2023-09-04 NOTE — Patient Instructions (Signed)
 Your Plan:  Continue Botox  therapy Will try to increase Vyepti to 300 mg     Thank you for coming to see us  at [facility]. I hope we have been able to provide you high quality care today.  You may receive a patient satisfaction survey over the next few weeks. We would appreciate your feedback and comments so that we may continue to improve ourselves and the health of our patients.

## 2023-09-05 NOTE — Telephone Encounter (Signed)
 I called [place] with [facility].  I relayed we saw pt and order for vyepti 300mg  was done.  He will fax for us  to sign.  (Send OV as well.

## 2023-09-07 ENCOUNTER — Other Ambulatory Visit (HOSPITAL_COMMUNITY): Payer: Self-pay | Admitting: Family

## 2023-09-07 DIAGNOSIS — F319 Bipolar disorder, unspecified: Secondary | ICD-10-CM

## 2023-09-12 ENCOUNTER — Other Ambulatory Visit (HOSPITAL_BASED_OUTPATIENT_CLINIC_OR_DEPARTMENT_OTHER): Payer: Self-pay

## 2023-09-12 MED ORDER — HYDROCHLOROTHIAZIDE 25 MG PO TABS
25.0000 mg | ORAL_TABLET | Freq: Every day | ORAL | 0 refills | Status: AC
  Filled 2023-09-12: qty 90, 90d supply, fill #0

## 2023-09-13 NOTE — Telephone Encounter (Signed)
 Signed order faxed back to [facility]. Received a receipt of confirmation.

## 2023-09-14 ENCOUNTER — Other Ambulatory Visit: Payer: Self-pay | Admitting: Neurology

## 2023-09-14 ENCOUNTER — Other Ambulatory Visit (HOSPITAL_BASED_OUTPATIENT_CLINIC_OR_DEPARTMENT_OTHER): Payer: Self-pay

## 2023-09-14 ENCOUNTER — Ambulatory Visit: Attending: Physician Assistant

## 2023-09-14 DIAGNOSIS — R29898 Other symptoms and signs involving the musculoskeletal system: Secondary | ICD-10-CM | POA: Diagnosis present

## 2023-09-14 NOTE — Therapy (Signed)
 OUTPATIENT PHYSICAL THERAPY LOWER EXTREMITY TREATMENT    Patient Name: Margaret Lopez MRN: [id number] DOB:January 07, 1971, 53 y.o., female Today's Date: 09/14/2023  END OF SESSION:  PT End of Session - 09/14/23 0929     Visit Number 6    Number of Visits 13    Date for PT Re-Evaluation 09/17/23    Authorization Type [employer]    Authorization Time Period --    PT Start Time 0930    PT Stop Time 1015    PT Time Calculation (min) 45 min    Activity Tolerance Patient tolerated treatment well    Behavior During Therapy [facility] for tasks assessed/performed              Past Medical History:  Diagnosis Date   Anxiety    Asthma    Bipolar disorder (HCC)    Depression    Deviated nasal septum 02/2011   Generalized anxiety disorder 04/16/2013   GERD (gastroesophageal reflux disease)    Headache(784.0)    migraines   Insomnia    Nasal turbinate hypertrophy 02/2011   bilat.   Neuromuscular disorder (HCC)    Dr. CHRISTELLA. Oneita- does injections around nerve in her head for prevention of migraines - q 3 months    Pneumonia    hx of    PONV (postoperative nausea and vomiting)    Seasonal allergies    Past Surgical History:  Procedure Laterality Date   ABDOMINAL HYSTERECTOMY     CARPAL TUNNEL RELEASE Right    DIAGNOSTIC LAPAROSCOPY     EXCISION MASS LOWER EXTREMETIES Left 06/21/2018   Procedure: Excision of left upper leg fat necrosis;  Surgeon: Lowery Estefana RAMAN, DO;  Location: [facility];  Service: Plastics;  Laterality: Left;   FOOT SURGERY Right    bone removed from great toe    GASTRIC ROUX-EN-Y N/A 05/16/2016   Procedure: LAPAROSCOPIC ROUX-EN-Y GASTRIC BYPASS WITH UPPER ENDOSCOPY;  Surgeon: Signe Mitzie LABOR, MD;  Location: [facility];  Service: General;  Laterality: N/A;   LAPAROSCOPIC VAGINAL HYSTERECTOMY  03/14/2007   LIPOMA EXCISION Left 08/26/2016   Procedure: EXCISION LIPOMA FROM LEFT UPPER POSTERIOR THIGH;  Surgeon: Signe Mitzie LABOR, MD;  Location: [facility];   Service: General;  Laterality: Left;   MYRINGOTOMY WITH TUBE PLACEMENT Bilateral 03/02/2015   Procedure: MYRINGOTOMY WITH T-TUBE PLACEMENT;  Surgeon: Daniel Moccasin, MD;  Location: [facility];  Service: ENT;  Laterality: Bilateral;   NASAL SEPTOPLASTY W/ TURBINOPLASTY  02/22/2011   Procedure: NASAL SEPTOPLASTY WITH TURBINATE REDUCTION;  Surgeon: Ana LELON Moccasin, MD;  Location: [facility];  Service: ENT;  Laterality: Bilateral;   TUBAL LIGATION  12/16/2004   TYMPANOSTOMY TUBE PLACEMENT Bilateral 07/21/2017   Patient Active Problem List   Diagnosis Date Noted   Chronic migraine without aura without status migrainosus, not intractable 12/05/2021   Migraine without aura and without status migrainosus, not intractable 12/05/2021   ERRONEOUS ENCOUNTER--DISREGARD 04/15/2021   Hematoma of leg 03/02/2018   Chronic migraine without aura, with intractable migraine, so stated, with status migrainosus 06/18/2017   GAD (generalized anxiety disorder) 04/16/2013   Bipolar 1 disorder, depressed (HCC) 04/16/2013   Generalized anxiety disorder 04/16/2013    PCP: Signa Rush MD   REFERRING PROVIDER: Alys Schuyler CHRISTELLA, PA  REFERRING DIAG: B hip pain   THERAPY DIAG:  Other symptoms and signs involving the musculoskeletal system  Rationale for Evaluation and Treatment: Rehabilitation  ONSET DATE: several months   SUBJECTIVE:  SUBJECTIVE STATEMENT:  I am doing great, I am walking a lot more. It helps me loosen up.   EVAL: This has been happening for awhile, when I walk a lot they really stiffen up. 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 The last time I was here was when I got my leg stuck between a street sign and a trailer tire and the muscles got ripped off the bone. It doesn't seem like these things are related. Its hard to lift legs up to get in the car. Low back is tight but doesn't seem to be related to this issue.   PERTINENT HISTORY: See above  PAIN:  Are you having pain?  Yes: NPRS scale: 2/10 Pain location: L>R hips  Pain description: soreness and tightness  Aggravating factors: repeated movement, stairs, sitting for long periods of time Relieving factors: lying down  PRECAUTIONS: None  RED FLAGS: None   WEIGHT BEARING RESTRICTIONS: No  FALLS:  Has patient fallen in last 6 months? 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    PATIENT SURVEYS:   LEFS 31/80  COGNITION: Overall cognitive status: Within functional limits for tasks assessed     SENSATION: Not tested    MUSCLE LENGTH:  HS mod limitation B  Piriformis OK  POSTURE: rounded shoulders and forward head    ROM:  Lumbar flexion 25% limited Lumbar extension WNL Lumbar lateral flexion WNL  Lumbar rotation 30% limited B  Hip flexion, IR, ER PROM OK     LOWER EXTREMITY MMT:  MMT Right eval Left eval Right 08/10/23 Left 08/10/23  Hip flexion 3 3 3 3   Hip extension 3- 3-    Hip abduction 3 3 3 3   Hip adduction      Hip internal rotation      Hip external rotation      Knee flexion 4- 4- 4 4  Knee extension 4 4 4 4   Ankle dorsiflexion      Ankle plantarflexion      Ankle inversion      Ankle eversion       (Blank rows = not tested)  LOWER EXTREMITY SPECIAL TESTS:   FABER (+) L, (-) R  FADIR OK B  Scour test (-) L, (+) R  TREATMENT DATE:  09/14/23 Recheck goals Bike L2 x25mins  Step up 6 Hip hikes x10 Resisted gait 20# 4 way x4 5# hip abd and ext STS 2 x10  08/10/23  Scifit bike L4x8 min for w/u  MMT  Bridge + ABD into green TB x15 Sidelying hip ABD x12 B green TB Prone hip extensions green TB x10 B Walking bridges x5 STS + green TB x12  Hip hikes x15 B Hip hikes + ABD x10  Hip hikes + swing x10 B Hip circles for mobility x10 CW/x10 CCW B     08/08/23 NuStep L4 x69mins  Passive supine LE stretches- HS, glutes, piriformis, LTR, SKTC  Bridge 2x10 Feet on pball rotations and knees to chest x10 Single knee bent fall out green band 2x10 Lateral band steps  STS 2x10   07/27/23  Nustep L4x8 minutes seat 8, BLEs only  DKTC with towel behind knees 6x5 seconds  Figure 4 stretch 2x30 seconds B  HS stretch 2x30 seconds B Quadruped green TB hip ABD x10 B Quadruped green TB hip extensions/knee bent x10 B Hip hikes x12 B    07/20/23 Nustep L 4 LE only ADD ball squeeze 20 x Green tband clams 20x Green tband hip flex alt 20  Red tband standing BIL 3 ways 12 x each Wt ball STS 10 x Feet on ball bridge, KTC and abd Feet on ball alt SLR 20 x PROM and stretching BIL LE and trunk- BIL tight HS and LEft side tighter in general vs RT   07/06/23  Eval, POC, HEP     PATIENT EDUCATION:  Education details: exam findings, POC, HEP; if hip pain does not improve with skilled PT may need to refer back to MD for further w/u, suspicion for some potential rheumatological involvement  Person educated: Patient Education method: Explanation, Demonstration, and Handouts Education comprehension: verbalized understanding, returned demonstration, and needs further education  HOME EXERCISE PROGRAM: Access Code: [email or web address] URL: [email or web address] Date: 07/20/2023 Prepared by: Angela Payseur  Exercises - Standing Hip Flexion with  Resistance Loop  - 1 x daily - 7 x weekly - 2 sets - 10 reps - Hip Abduction with Resistance Loop  - 1 x daily - 7 x weekly - 2 sets - 10 reps - Hip Extension with Resistance Loop  - 1 x daily - 7 x weekly - 2 sets - 10 reps - Seated Table Hamstring Stretch  - 2-3 x daily - 7 x weekly - 2 sets - 3 reps - 30 hold  Access Code: [email or web address] URL: [email or web address] Date: 07/06/2023 Prepared by: Josette Rough  Exercises - Supine Single Knee to Chest Stretch  - 1-2 x daily - 7 x weekly - 1 sets - 10 reps - 5 seconds  hold - Supine Lower Trunk Rotation  - 1-2 x daily - 7 x weekly - 1 sets - 10 reps - 5 seconds  hold - Supine Bridge  - 1 x daily - 5 x weekly - 2 sets - 10 reps - 1 second  hold - Seated Hip Abduction with Resistance  - 1 x daily - 5 x weekly - 2 sets - 10 reps - 1 second  hold  ASSESSMENT:  CLINICAL IMPRESSION:   Patient doing well, and reports feeling good overall. Focused on functional strengthening today, had more difficulty with cable hip exercise and some mm fatigue. Agreed to discharge as she has met her goals and feels she can continue on her own.    EVAL:  Patient is a 53 y.o. F who was seen today for physical therapy evaluation and treatment for B hip pain. Her hip pain started insidiously and has gotten worse over the past few months. Objectives as above. Bilateral joint pain starting at the same time with some reports of lumbar stiffness does make me question if there could possible be rheumatological involvement, we will monitor. We will make every effort to improve pain and function moving forward.    OBJECTIVE IMPAIRMENTS: Abnormal gait, decreased activity tolerance, decreased mobility, difficulty walking, decreased strength, impaired flexibility, and pain.   ACTIVITY LIMITATIONS: sitting, standing, squatting, sleeping, stairs, transfers, bed mobility, and locomotion level  PARTICIPATION LIMITATIONS: driving, shopping, community activity, occupation, and  yard work  PERSONAL FACTORS: Age, Behavior pattern, Fitness, Past/current experiences, and Time since onset of injury/illness/exacerbation are also affecting patient's functional outcome.   REHAB POTENTIAL: Fair sedentary lifestyle, chronicity of pain, question possible rheumatological involvement complicating PT    CLINICAL DECISION MAKING: Stable/uncomplicated  EVALUATION COMPLEXITY: Low   GOALS: Goals reviewed with patient? No  SHORT TERM GOALS: Target date: 07/27/2023   Will be compliant with appropriate progressive HEP  Baseline: Goal status: MET 07/20/23  2.  Lumbar AROM to have normalized  Baseline:  Goal status: 07/20/23 progressing WLS minus flexion limited 40%- HS tightness, MET 09/14/23  3.  HS flexibility to be no more than 25% limited B  Baseline:  Goal status: MET 07/20/23   4.  Pain with extended sitting or standing/walking to be no more than 6/10 Baseline:  Goal status: 07/20/23 progressing, MET 09/14/23    LONG TERM GOALS: Target date: 08/17/2023    MMT to have improved by at least one grade in all weak groups  Baseline:  Goal status: 5/5 BLE 09/14/23  2.  Pain with all functional extended tasks (sitting, walking, standing) to be no more than 3/10 at worst  Baseline:  Goal status: Met 09/14/23  3.  Will be able to perform car transfers with hip pain no more than 3/10 Baseline:  Goal status: MET 09/14/23  4.  LEFS to have improved by at least 20 points  Baseline: 31 Goal status: 61/80 MET 09/14/23     PLAN:  PT FREQUENCY: 1-2x/week  PT DURATION: 6 weeks  PLANNED INTERVENTIONS: 97750- Physical Performance Testing, 97110-Therapeutic exercises, 97530- Therapeutic activity, 97112- Neuromuscular re-education, 97535- Self Care, [id number]- Manual therapy, [id number]- Gait training, and [phone]- Ultrasound  PLAN FOR NEXT SESSION: d/c  PHYSICAL THERAPY DISCHARGE SUMMARY  Visits from Start of Care: 6   Patient agrees to discharge. Patient goals were met. Patient is  being discharged due to being pleased with the current functional level.    Almetta Fam, PT, DPT 09/14/23 10:09 AM

## 2023-09-18 ENCOUNTER — Other Ambulatory Visit (HOSPITAL_BASED_OUTPATIENT_CLINIC_OR_DEPARTMENT_OTHER): Payer: Self-pay

## 2023-09-18 MED ORDER — FAMOTIDINE 20 MG PO TABS
20.0000 mg | ORAL_TABLET | Freq: Two times a day (BID) | ORAL | 0 refills | Status: AC
  Filled 2023-09-18 – 2023-10-06 (×2): qty 180, 90d supply, fill #0

## 2023-09-29 ENCOUNTER — Other Ambulatory Visit (HOSPITAL_BASED_OUTPATIENT_CLINIC_OR_DEPARTMENT_OTHER): Payer: Self-pay

## 2023-10-02 ENCOUNTER — Telehealth (HOSPITAL_BASED_OUTPATIENT_CLINIC_OR_DEPARTMENT_OTHER): Admitting: Family

## 2023-10-02 ENCOUNTER — Encounter (HOSPITAL_COMMUNITY): Payer: Self-pay | Admitting: Family

## 2023-10-02 DIAGNOSIS — F319 Bipolar disorder, unspecified: Secondary | ICD-10-CM | POA: Diagnosis not present

## 2023-10-02 MED ORDER — PROPRANOLOL HCL 10 MG PO TABS: 10.0000 mg | ORAL_TABLET | Freq: Two times a day (BID) | ORAL | 0 refills | Status: AC

## 2023-10-02 MED ORDER — LITHIUM CARBONATE ER 300 MG PO TBCR: 600.0000 mg | EXTENDED_RELEASE_TABLET | Freq: Every evening | ORAL | 0 refills | Status: DC

## 2023-10-02 MED ORDER — DIVALPROEX SODIUM ER 500 MG PO TB24: 1500.0000 mg | ORAL_TABLET | Freq: Every day | ORAL | 0 refills | Status: DC

## 2023-10-02 NOTE — Progress Notes (Signed)
 Virtual Visit via Video Note  I connected with Margaret Lopez on 10/02/23 at  1:00 PM EDT by a video enabled telemedicine application and verified that I am speaking with the correct person using two identifiers.  Location: Patient: Home Provider: Office   I discussed the limitations of evaluation and management by telemedicine and the availability of in person appointments. The patient expressed understanding and agreed to proceed.   I discussed the assessment and treatment plan with the patient. The patient was provided an opportunity to ask questions and all were answered. The patient agreed with the plan and demonstrated an understanding of the instructions.   The patient was advised to call back or seek an in-person evaluation if the symptoms worsen or if the condition fails to improve as anticipated.  I provided 15 minutes of non-face-to-face time during this encounter.   Staci LOISE Kerns, NP   [facility] MD/PA/NP OP Progress Note  10/02/2023 1:08 PM SHERIKA KUBICKI  MRN:  [id number]  Chief Complaint: Medication management   HPI: Margaret Lopez 53 year old female presents for medication management follow-up appointment.  She carries a diagnosis related to major depressive disorder, generalized anxiety disorder and bipolar 1 disorder.  Currently she is prescribed lithium  and Depakote  which she reports she has been taking and tolerating well.  Reported she has been taking lithium  and Depakote  for many years.  Last valproic  level was 5 months prior 61 and lithium  level 1.2.  TSH 4.99 unsure of current results.  Cherrie states overall her mood is stabilized.    Zeyna states she was recently seen and evaluated by her primary care provider through [facility] for thyroid  testing. reports that her levels have trended downward.  Reports some residual anxiety symptoms.  Discussed initiating hydroxyzine  and/or Inderal  she was amendable to plan.  Reports a good appetite.  States she is  resting well throughout the night.  Candus reports she is interested in restarting nonstimulant medication for concentration and focus.  She was previously prescribed guanfacine  1 mg.  States she would like to restart medication to see if it helps with her symptoms.  Previously prescribed Adderall which was too stimulating has since discontinued mood to stabilize.  Visit Diagnosis:    ICD-10-CM   1. Bipolar 1 disorder, depressed (HCC)  F31.9 lithium  carbonate (LITHOBID ) 300 MG ER tablet    divalproex  (DEPAKOTE  ER) 500 MG 24 hr tablet      Past Psychiatric History:   Past Medical History:  Past Medical History:  Diagnosis Date   Anxiety    Asthma    Bipolar disorder (HCC)    Depression    Deviated nasal septum 02/2011   Generalized anxiety disorder 04/16/2013   GERD (gastroesophageal reflux disease)    Headache(784.0)    migraines   Insomnia    Nasal turbinate hypertrophy 02/2011   bilat.   Neuromuscular disorder (HCC)    Dr. CHRISTELLA. Oneita- does injections around nerve in her head for prevention of migraines - q 3 months    Pneumonia    hx of    PONV (postoperative nausea and vomiting)    Seasonal allergies     Past Surgical History:  Procedure Laterality Date   ABDOMINAL HYSTERECTOMY     CARPAL TUNNEL RELEASE Right    DIAGNOSTIC LAPAROSCOPY     EXCISION MASS LOWER EXTREMETIES Left 06/21/2018   Procedure: Excision of left upper leg fat necrosis;  Surgeon: Lowery Estefana RAMAN, DO;  Location: [facility];  Service: Government social research officer;  Laterality: Left;   FOOT SURGERY Right    bone removed from great toe    GASTRIC ROUX-EN-Y N/A 05/16/2016   Procedure: LAPAROSCOPIC ROUX-EN-Y GASTRIC BYPASS WITH UPPER ENDOSCOPY;  Surgeon: Signe Mitzie LABOR, MD;  Location: [facility];  Service: General;  Laterality: N/A;   LAPAROSCOPIC VAGINAL HYSTERECTOMY  03/14/2007   LIPOMA EXCISION Left 08/26/2016   Procedure: EXCISION LIPOMA FROM LEFT UPPER POSTERIOR THIGH;  Surgeon: Signe Mitzie LABOR, MD;  Location:  [facility];  Service: General;  Laterality: Left;   MYRINGOTOMY WITH TUBE PLACEMENT Bilateral 03/02/2015   Procedure: MYRINGOTOMY WITH T-TUBE PLACEMENT;  Surgeon: Daniel Moccasin, MD;  Location: [facility];  Service: ENT;  Laterality: Bilateral;   NASAL SEPTOPLASTY W/ TURBINOPLASTY  02/22/2011   Procedure: NASAL SEPTOPLASTY WITH TURBINATE REDUCTION;  Surgeon: Ana LELON Moccasin, MD;  Location: [facility];  Service: ENT;  Laterality: Bilateral;   TUBAL LIGATION  12/16/2004   TYMPANOSTOMY TUBE PLACEMENT Bilateral 07/21/2017    Family Psychiatric History:   Family History:  Family History  Problem Relation Age of Onset   ADD / ADHD Sister    Hypertension Mother    Sarcoidosis Mother    Rheum arthritis Mother    Hypertension Father    Heart Problems Father    Alcohol abuse Maternal Grandmother    Bipolar disorder Maternal Grandmother    Breast cancer Maternal Grandmother    Breast cancer Maternal Aunt    Heart Problems Other        dad's side of family   Suicidality Neg Hx     Social History:  Social History   Socioeconomic History   Marital status: Legally Separated    Spouse name: Not on file   Number of children: 2   Years of education: Not on file   Highest education level: Some college, no degree  Occupational History   Not on file  Tobacco Use   Smoking status: Former    Current packs/day: 0.00    Average packs/day: 0.5 packs/day for 4.0 years (2.0 ttl pk-yrs)    Types: Cigarettes    Start date: 11/12/2011    Quit date: 11/12/2015    Years since quitting: 7.8   Smokeless tobacco: Never  Vaping Use   Vaping status: Never Used  Substance and Sexual Activity   Alcohol use: No    Alcohol/week: 0.0 standard drinks of alcohol    Comment: once a month or less   Drug use: No   Sexual activity: Yes    Partners: Male    Birth control/protection: Surgical    Comment: Hysterectomy  Other Topics Concern   Not on file  Social History Narrative   Lives at home  with fiance', his son, and her children come and go from the home (66 y.o. and 51 y.o.)   Right handed   Caffeine: 2 cups daily   Social Drivers of Health   Financial Resource Strain: Not on file  Food Insecurity: Not on file  Transportation Needs: Not on file  Physical Activity: Sufficiently Active (11/17/2016)   Exercise Vital Sign    Days of Exercise per Week: 5 days    Minutes of Exercise per Session: 40 min  Stress: No Stress Concern Present (11/17/2016)   [employer] of Occupational Health - Occupational Stress Questionnaire    Feeling of Stress : Only a little  Social Connections: Not on file    Allergies:  Allergies  Allergen Reactions   Doxazosin Nausea  And Vomiting   Morphine  And Codeine Itching   Phenergan [Promethazine Hcl]      knocks out for several hours   Aloe Hives   Penicillins Rash    Has patient had a PCN reaction causing immediate rash, facial/tongue/throat swelling, SOB or lightheadedness with hypotension: No Has patient had a PCN reaction causing severe rash involving mucus membranes or skin necrosis: No Has patient had a PCN reaction that required hospitalization No Has patient had a PCN reaction occurring within the last 10 years: No If all of the above answers are NO, then may proceed with Cephalosporin use.    Sulfa Antibiotics Rash    Metabolic Disorder Labs: No results found for: HGBA1C, MPG No results found for: PROLACTIN No results found for: CHOL, TRIG, HDL, CHOLHDL, VLDL, LDLCALC Lab Results  Component Value Date   TSH 4.990 (H) 04/13/2023   TSH 4.610 (H) 05/31/2022    Therapeutic Level Labs: Lab Results  Component Value Date   LITHIUM  1.2 04/13/2023   LITHIUM  1.0 05/31/2022   Lab Results  Component Value Date   VALPROATE 61 04/13/2023   VALPROATE 91 05/31/2022   No results found for: CBMZ  Current Medications: Current Outpatient Medications  Medication Sig Dispense Refill   propranolol   (INDERAL ) 10 MG tablet Take 1 tablet (10 mg total) by mouth 2 (two) times daily. 60 tablet 0   albuterol  (PROVENTIL  HFA;VENTOLIN  HFA) 108 (90 Base) MCG/ACT inhaler Inhale 1-2 puffs into the lungs every 6 (six) hours as needed for wheezing or shortness of breath.     botulinum toxin Type A  (BOTOX ) 100 units SOLR injection Provider to inject 155 units into the muscles of the head and neck every 3 months. Discard remainder. 1 each 3   botulinum toxin Type A  (BOTOX ) 100 units SOLR injection Provider to inject 155 units into head and neck muscles every 12 weeks, discard remainder 2 each 3   cyclobenzaprine  (FLEXERIL ) 10 MG tablet TAKE 1/2 TO 1 TABLET(5 TO 10 MG) BY MOUTH AT BEDTIME AS NEEDED 30 tablet 14   divalproex  (DEPAKOTE  ER) 500 MG 24 hr tablet Take 3 tablets (1,500 mg total) by mouth daily. 180 tablet 0   famotidine  (PEPCID ) 20 MG tablet Take 1 tablet (20 mg total) by mouth 2 (two) times daily. 180 tablet 0   fluocinonide  cream (LIDEX ) 0.05 % Apply 1 Application topically to affected area 2 (two) times daily. 60 g 0   gabapentin  (NEURONTIN ) 100 MG capsule TAKE 1 CAPSULE BY MOUTH THREE TIMES A DAY AS NEEDED 90 capsule 6   hydrochlorothiazide  (HYDRODIURIL ) 25 MG tablet Take 1 tablet (25 mg total) by mouth daily in the morning. 90 tablet 0   hydrOXYzine  (ATARAX /VISTARIL ) 25 MG tablet Take 25 mg by mouth at bedtime as needed for itching.  3   ipratropium (ATROVENT) 0.06 % nasal spray Place 2 sprays into both nostrils daily as needed. Allergies, runny nose.  4   ketorolac  (TORADOL ) 60 MG/2ML SOLN injection Inject 1-2ml (30-60mg ) intramuscularly at onset of migraine. May repeat in 6 hours. Max twice a day and 4 days per month. 10 mL 4   levocetirizine (XYZAL) 5 MG tablet Take 5 mg by mouth at bedtime.      levothyroxine (SYNTHROID) 25 MCG tablet Take 25 mcg by mouth every morning.     lithium  carbonate (LITHOBID ) 300 MG ER tablet Take 2 tablets (600 mg total) by mouth every evening. 60 tablet 0    methylPREDNISolone  (MEDROL  DOSEPAK) 4 MG TBPK tablet  Take pills daily all together with food. Take the first dose (6 pills) as soon as possible. Take the rest each morning. For 6 days total 6-5-4-3-2-1. 21 tablet 1   methylPREDNISolone  (MEDROL  DOSEPAK) 4 MG TBPK tablet TAKE AS DIRECTED WITH FOOD 21 each 0   Multiple Vitamins-Minerals (OPURITY BYPASS OPTIMIZED) CHEW Chew 1 tablet by mouth daily.     naratriptan  (AMERGE) 2.5 MG tablet TAKE 1 TABLET BY MOUTH FOR HEADACHE OR MIGRAINE. DO NOT EXCEED 2 DOSES OF ANY TRIPTAN IN 1 DAY 10 tablet 6   ondansetron  (ZOFRAN -ODT) 8 MG disintegrating tablet TAKE 1 TABLET BY MOUTH EVERY 8 HOURS AS NEEDED FOR NAUSEA OR VOMITING. 30 tablet 1   prochlorperazine  (COMPAZINE ) 10 MG tablet Take 1 tablet (10 mg total) by mouth 3 (three) times daily as needed for nausea or vomiting (migraine). MAY CAUSE SEDATION. 30 tablet 3   Rimegepant Sulfate (NURTEC) 75 MG TBDP Take 1 tablet (75 mg total) by mouth daily as needed. For migraines. Take as close to onset of migraine as possible. One daily maximum. 16 tablet 11   Semaglutide -Weight Management (WEGOVY ) 0.5 MG/0.5ML SOAJ Inject 0.5 mg into the skin once a week. 2 mL 0   Semaglutide -Weight Management (WEGOVY ) 1 MG/0.5ML SOAJ Inject 1 mg into the skin once a week. 2 mL 0   sodium chloride  0.9 % SOLN 100 mL with Eptinezumab-jjmr 100 MG/ML SOLN 100 mg Inject 100 mg into the vein every 3 (three) months. 100 mg 4   SUMAtriptan  (TOSYMRA ) 10 MG/ACT SOLN Place 10 mg into the nose every hour as needed. 2 each 0   SUMAtriptan  Succinate (ZEMBRACE SYMTOUCH ) 3 MG/0.5ML SOAJ Inject 3 mg into the skin every 1 hour as needed for migraine. Max 4 injections in one day. 12 mL 11   SYRINGE-NEEDLE, DISP, 3 ML (BD INTEGRA SYRINGE) 25G X 1 3 ML MISC ATTACH NEEDLE TO SYRINGE AND USE TO DRAW UP AND ADMINISTER TORADOL . DO NOT REUSE. 4 each 0   tirzepatide  (ZEPBOUND ) 2.5 MG/0.5ML Pen Inject 2.5 mg into the skin once a week. 2 mL 0   tirzepatide  (ZEPBOUND ) 5  MG/0.5ML Pen Inject 5 mg into the skin once a week. 2 mL 0   valACYclovir (VALTREX) 500 MG tablet Take 500 mg by mouth See admin instructions. Takes 500mg  daily for 5 days when she has a flare up.  3   Zavegepant HCl (ZAVZPRET ) 10 MG/ACT SOLN Place 1 spray into the nose daily as needed. PATIENT HAS COPAY CARD WILL BRING 6 each 11   Current Facility-Administered Medications  Medication Dose Route Frequency Provider Last Rate Last Admin   botulinum toxin Type A  (BOTOX ) injection 100 Units  100 Units Intramuscular Once Ahern, Antonia B, MD       botulinum toxin Type A  (BOTOX ) injection 155 Units  155 Units Intramuscular Once Ahern, Antonia B, MD         Musculoskeletal: Virtual assessment  Psychiatric Specialty Exam: Review of Systems  There were no vitals taken for this visit.There is no height or weight on file to calculate BMI.  General Appearance: Casual  Eye Contact:  Good  Speech:  Clear and Coherent  Volume:  Normal  Mood:  Anxious  Affect:  Congruent  Thought Process:  Coherent  Orientation:  Full (Time, Place, and Person)  Thought Content: Logical   Suicidal Thoughts:  No  Homicidal Thoughts:  No  Memory:  Immediate;   Fair Recent;   Fair  Judgement:  Fair  Insight:  Fair  Psychomotor Activity:  Normal  Concentration:  Concentration: Fair  Recall:  Good  Fund of Knowledge: Good  Language: Good  Akathisia:  No  Handed:  Right  AIMS (if indicated): not done  Assets:  Communication Skills Desire for Improvement  ADL's:  Intact  Cognition: WNL  Sleep:  Fair   Screenings: PHQ2-9    Flowsheet Row Office Visit from [facility]/17/20[facility] in [facility] Video Visit from 03/31/2022 in [facility] Video Visit from 10/14/2021 [facility] Video Visit from 07/[facility] Video Visit from [facility]/2021 [facility]  PHQ-2 Total Score 3 0 0 0 2  PHQ-9 Total Score 18 -- -- -- 3   Flowsheet Row Video Visit from 03/31/2022 in [facility] Video Visit from 10/14/2021 in [facility] Video Visit from 07[facility]15/2021 in [facility]  C-SSRS RISK CATEGORY No Risk No Risk No Risk     Assessment and Plan: Margaret Lopez 53 year old female who presents for medication management follow-up appointment.  Currently lithium  Depakote  which she reports she has been taking and tolerating well.  Reports she recently discussed TSH/thyroid  panel with primary care provider who reports levels appear to be trending down.  States overall her mood is stabilized.  Does report some residual anxiety symptoms.  Discussed initiating hydroxyzine  and/or propranolol  for symptoms.  Would like to restart guanfacine  for concentration and focus.  Nonstimulant medication.  Will make propranolol  10 mg p.o. twice daily available and guanfacine  1 mL patient was amendable to plan.  Follow-up appointment/tolerability.  Support, encouragement and reassurance was provided.  Collaboration of Care: Collaboration of Care: Medication Management AEB restart guanfacine  initiated propranolol   Patient/Guardian was advised Release of Information must be obtained prior to any record release in order to collaborate their care with an outside provider. Patient/Guardian was advised if they have not already done so to contact the registration department to sign all necessary forms in order for us  to release information regarding their care.   Consent: Patient/Guardian gives verbal consent for treatment and assignment of benefits for services provided during this visit. Patient/Guardian expressed understanding and agreed to proceed.    Staci LOISE Kerns, NP 10/02/2023, 1:08 PM

## 2023-10-05 ENCOUNTER — Other Ambulatory Visit (HOSPITAL_BASED_OUTPATIENT_CLINIC_OR_DEPARTMENT_OTHER): Payer: Self-pay

## 2023-10-05 MED ORDER — ZEPBOUND 7.5 MG/0.5ML ~~LOC~~ SOAJ
7.5000 mg | SUBCUTANEOUS | 0 refills | Status: DC
  Filled 2023-10-05: qty 2, 28d supply, fill #0

## 2023-10-06 ENCOUNTER — Other Ambulatory Visit (HOSPITAL_BASED_OUTPATIENT_CLINIC_OR_DEPARTMENT_OTHER): Payer: Self-pay

## 2023-10-11 ENCOUNTER — Other Ambulatory Visit: Payer: Self-pay

## 2023-10-11 NOTE — Progress Notes (Signed)
 Specialty Pharmacy Refill Coordination Note  Margaret Lopez is a 53 y.o. female contacted today regarding refills of specialty medication(s) OnabotulinumtoxinA  (Botox )   Patient requested Courier to Provider Office   Delivery date: 10/18/23   Verified address: [facility] [address] [place]   Medication will be filled on 10/17/23.

## 2023-10-12 ENCOUNTER — Other Ambulatory Visit: Payer: Self-pay

## 2023-10-12 ENCOUNTER — Encounter: Payer: Self-pay | Admitting: Adult Health

## 2023-10-12 NOTE — Progress Notes (Signed)
 Patient called back and was able to get rescheduled with another with provider at [facility]. Courier 10.8.25.

## 2023-10-12 NOTE — Progress Notes (Signed)
 The patient called to inform us  that the provider that administers her Botox  has left the practice, she asked that we cancel the order for now while she works to find a new provider.  We will call her in 2 weeks, but she is aware to call us  before that time, if needed.

## 2023-10-12 NOTE — Telephone Encounter (Signed)
 Pt called the phone room and I was able to accept the call. I rescheduled her to see Megan on 10/15 for Botox . She is aware that Megan will only inject the head/neck for migraines. Made sure patient was clear that Megan will not inject jaw/mouth.

## 2023-10-17 ENCOUNTER — Other Ambulatory Visit: Payer: Self-pay

## 2023-10-19 ENCOUNTER — Telehealth: Payer: Self-pay | Admitting: *Deleted

## 2023-10-19 ENCOUNTER — Other Ambulatory Visit (HOSPITAL_COMMUNITY): Payer: Self-pay | Admitting: Internal Medicine

## 2023-10-19 DIAGNOSIS — R635 Abnormal weight gain: Secondary | ICD-10-CM

## 2023-10-19 DIAGNOSIS — R5383 Other fatigue: Secondary | ICD-10-CM

## 2023-10-19 DIAGNOSIS — Z8249 Family history of ischemic heart disease and other diseases of the circulatory system: Secondary | ICD-10-CM

## 2023-10-19 DIAGNOSIS — R600 Localized edema: Secondary | ICD-10-CM

## 2023-10-19 NOTE — Telephone Encounter (Signed)
 Received new CMM key from [facility]. Key: [id number]. This has been submitted to plan,  [employer] [place]  Medicaid.SABRA

## 2023-10-19 NOTE — Telephone Encounter (Signed)
 Received call from [place] w/ [facility] notifying us  that fax was sent to our office with CMM key for increasing Vyepti to 300 mg dose. Patient is due for infusion next week. Please send asap, thank you!  KEY: [id number]

## 2023-10-19 NOTE — Telephone Encounter (Signed)
 [place] from [facility] called needing to speak to the nurse. Please call back when available. [phone].

## 2023-10-19 NOTE — Telephone Encounter (Signed)
 This PA will need to be changed to prescriber Duwaine Russell. I called [place] w/ [facility]. They will also fax new order to our office for Megan to sign and update PA to reflect change in prescriber. [facility] will let us  know when PA is ready to be sent to plan. He also clarified that pt's infusion will not be until 10/23 so we can have Megan sign next Monday when she returns to office.

## 2023-10-19 NOTE — Telephone Encounter (Signed)
 Called [place] back. See phone note from today.

## 2023-10-24 ENCOUNTER — Ambulatory Visit: Admitting: Neurology

## 2023-10-24 NOTE — Telephone Encounter (Signed)
 We got it and it is pending Megan NP's signature.

## 2023-10-24 NOTE — Telephone Encounter (Signed)
 Signed order faxed back to [facility] [place]. [phone]. Received a receipt of confirmation.

## 2023-10-24 NOTE — Progress Notes (Unsigned)
 10/25/23: This is the first Botox  injection with me.  Patient also receives Vyepti infusions. Usually have severe migraines 2 weeks before next botox  is due but after being on vepti it was only 1 week this time.  2 migraines a month on average. Can take either toradol  or nurtec with good benefit.    BOTOX  PROCEDURE NOTE FOR MIGRAINE HEADACHE    Contraindications and precautions discussed with patient(above). Aseptic procedure was observed and patient tolerated procedure. Procedure performed by Duwaine Russell, NP  The condition has existed for more than 6 months, and pt does not have a diagnosis of ALS, Myasthenia Gravis or Lambert-Eaton Syndrome.  Risks and benefits of injections discussed and pt agrees to proceed with the procedure.  Written consent obtained  These injections do not cause sedations or hallucinations which the oral therapies may cause.  Indication/Diagnosis: chronic migraine BOTOX (G9414) injection was performed according to protocol by Aller[employer]. 200 units of BOTOX  was dissolved into 4 cc NS.   NDC: [address]-98  Type of toxin: Botox   Botox - 100 units x 2 vials Lot: [id number] Expiration: 11/2025 x2 NDC: 9976-8854-98 x2   Bacteriostatic 0.9% Sodium Chloride - 4 mL  Lot: [id number] Expiration:10/2024 NDC: 9590-8033-97   Dx: H56.290    Description of procedure:  The patient was placed in a sitting position. The standard protocol was used for Botox  as follows, with 5 units of Botox  injected at each site:   -Procerus muscle, midline injection  -Corrugator muscle, bilateral injection  -Frontalis muscle, bilateral injection, with 2 sites each side, medial injection was performed in the upper one third of the frontalis muscle, in the region vertical from the medial inferior edge of the superior orbital rim. The lateral injection was again in the upper one third of the forehead vertically above the lateral limbus of the cornea, 1.5 cm lateral to the medial  injection site.  -Temporalis muscle injection, 4 sites, bilaterally. The first injection was 3 cm above the tragus of the ear, second injection site was 1.5 cm to 3 cm up from the first injection site in line with the tragus of the ear. The third injection site was 1.5-3 cm forward between the first 2 injection sites. The fourth injection site was 1.5 cm posterior to the second injection site.  -Occipitalis muscle injection, 3 sites, bilaterally. The first injection was done one half way between the occipital protuberance and the tip of the mastoid process behind the ear. The second injection site was done lateral and superior to the first, 1 fingerbreadth from the first injection. The third injection site was 1 fingerbreadth superiorly and medially from the first injection site.  -Cervical paraspinal muscle injection, 2 sites, bilateral knee first injection site was 1 cm from the midline of the cervical spine, 3 cm inferior to the lower border of the occipital protuberance. The second injection site was 1.5 cm superiorly and laterally to the first injection site.  -Trapezius muscle injection was performed at 3 sites, bilaterally. The first injection site was in the upper trapezius muscle halfway between the inflection point of the neck, and the acromion. The second injection site was one half way between the acromion and the first injection site. The third injection was done between the first injection site and the inflection point of the neck.   Will return for repeat injection in 3 months.   A 200 unit sof Botox  was used, 155 units were injected, the rest of the Botox  was wasted. The patient tolerated the procedure  well, there were no complications of the above procedure.  Duwaine Russell, MSN, NP-C 10/24/2023, 5:25 PM [facility] [address] [place], [place] [address] [phone]

## 2023-10-24 NOTE — Telephone Encounter (Signed)
 Margaret Lopez from [facility] called wanting to inform the nurse that he has faxed over the order form for the pt that is needing a signature from the provider. [phone]

## 2023-10-25 ENCOUNTER — Ambulatory Visit: Admitting: Adult Health

## 2023-10-25 ENCOUNTER — Ambulatory Visit: Admitting: Family Medicine

## 2023-10-25 ENCOUNTER — Encounter: Payer: Self-pay | Admitting: Adult Health

## 2023-10-25 VITALS — BP 127/78 | HR 68

## 2023-10-25 DIAGNOSIS — G43709 Chronic migraine without aura, not intractable, without status migrainosus: Secondary | ICD-10-CM | POA: Diagnosis not present

## 2023-10-25 DIAGNOSIS — G43E09 Chronic migraine with aura, not intractable, without status migrainosus: Secondary | ICD-10-CM

## 2023-10-25 MED ORDER — ONABOTULINUMTOXINA 100 UNITS IJ SOLR
155.0000 [IU] | Freq: Once | INTRAMUSCULAR | Status: AC
  Administered 2023-10-25: 155 [IU] via INTRAMUSCULAR

## 2023-10-25 NOTE — Progress Notes (Signed)
 Botox - 100 units x 2 vials Lot: [id number] Expiration: 11/2025 x2 NDC: 9976-8854-98 x2   Bacteriostatic 0.9% Sodium Chloride - 4 mL  Lot: [id number] Expiration:10/2024 NDC: 9590-8033-97   Dx: G43.709 S/P  Witnessed by Rojean DEL

## 2023-10-25 NOTE — Telephone Encounter (Signed)
 It looks like the last time this was prescribed was in January is that correct or did she have refills?  If it was January okay to send in or I can send it in

## 2023-10-26 ENCOUNTER — Other Ambulatory Visit: Payer: Self-pay

## 2023-10-26 ENCOUNTER — Other Ambulatory Visit (HOSPITAL_COMMUNITY): Payer: Self-pay

## 2023-10-26 NOTE — Telephone Encounter (Signed)
 Error

## 2023-10-27 ENCOUNTER — Other Ambulatory Visit (HOSPITAL_COMMUNITY): Payer: Self-pay | Admitting: Family

## 2023-10-31 ENCOUNTER — Encounter: Payer: Self-pay | Admitting: *Deleted

## 2023-10-31 NOTE — Telephone Encounter (Signed)
  error

## 2023-11-01 ENCOUNTER — Other Ambulatory Visit (HOSPITAL_BASED_OUTPATIENT_CLINIC_OR_DEPARTMENT_OTHER): Payer: Self-pay

## 2023-11-01 MED ORDER — ESTRADIOL 0.05 MG/24HR TD PTTW
1.0000 | MEDICATED_PATCH | TRANSDERMAL | 3 refills | Status: AC
  Filled 2023-11-01: qty 24, 84d supply, fill #0

## 2023-11-02 ENCOUNTER — Other Ambulatory Visit: Payer: Self-pay | Admitting: *Deleted

## 2023-11-02 ENCOUNTER — Other Ambulatory Visit (HOSPITAL_COMMUNITY): Payer: Self-pay

## 2023-11-02 NOTE — Telephone Encounter (Signed)
 I called pharmacy [facility],  she last got toradol  10/18/2023 no refills, got zofran  10/14/23 no refills. [facility]prednisone from [facility] or [facility] 01/2023?  Pt states last used early 10/2023.  Ok to renew?

## 2023-11-06 MED ORDER — ONDANSETRON 8 MG PO TBDP: 8.0000 mg | ORAL_TABLET | Freq: Three times a day (TID) | ORAL | 1 refills | Status: DC | PRN

## 2023-11-06 MED ORDER — NURTEC 75 MG PO TBDP: 75.0000 mg | ORAL_TABLET | Freq: Every day | ORAL | 11 refills | Status: AC | PRN

## 2023-11-06 MED ORDER — KETOROLAC TROMETHAMINE 60 MG/2ML IM SOLN
INTRAMUSCULAR | 4 refills | Status: AC
Start: 2023-11-06 — End: ?

## 2023-11-06 NOTE — Addendum Note (Signed)
 Addended by: SHERRYL DUWAINE SQUIBB on: 11/06/2023 12:39 PM   Modules accepted: Orders

## 2023-11-07 ENCOUNTER — Other Ambulatory Visit (HOSPITAL_COMMUNITY): Payer: Self-pay

## 2023-11-07 ENCOUNTER — Telehealth: Payer: Self-pay | Admitting: Pharmacist

## 2023-11-07 NOTE — Telephone Encounter (Signed)
 Pharmacy Patient Advocate Encounter   Received notification from CoverMyMeds that prior authorization for Nurtec 75MG  dispersible tablets is required/requested.   Insurance verification completed.   The patient is insured through [employer].   Per test claim: PA required; PA submitted to above mentioned insurance via Latent Key/confirmation #/EOC [id number] Status is pending

## 2023-11-07 NOTE — Telephone Encounter (Signed)
 Pharmacy Patient Advocate Encounter  Received notification from [employer] that Prior Authorization for Nurtec 75MG  dispersible tablets has been APPROVED from 11/07/2023 to 11/06/2024   PA #/Case ID/Reference #: [id number]

## 2023-11-08 ENCOUNTER — Telehealth: Payer: Self-pay | Admitting: *Deleted

## 2023-11-08 NOTE — Telephone Encounter (Signed)
 Received fax of nurse notes stating patient received vyepti 100mg /  10/26/2023. [facility] RN .   Tolerated the infusion well.  Report sent to scan.

## 2023-11-14 ENCOUNTER — Other Ambulatory Visit: Payer: Self-pay | Admitting: Obstetrics and Gynecology

## 2023-11-14 DIAGNOSIS — Z87898 Personal history of other specified conditions: Secondary | ICD-10-CM

## 2023-11-16 ENCOUNTER — Ambulatory Visit (HOSPITAL_COMMUNITY)
Admission: RE | Admit: 2023-11-16 | Discharge: 2023-11-16 | Disposition: A | Discharge status: Home / Self Care | Source: Ambulatory Visit | Attending: Internal Medicine | Admitting: Internal Medicine

## 2023-11-16 DIAGNOSIS — R600 Localized edema: Secondary | ICD-10-CM

## 2023-11-16 DIAGNOSIS — Z8249 Family history of ischemic heart disease and other diseases of the circulatory system: Secondary | ICD-10-CM

## 2023-11-16 DIAGNOSIS — R5383 Other fatigue: Secondary | ICD-10-CM | POA: Diagnosis not present

## 2023-11-16 DIAGNOSIS — R635 Abnormal weight gain: Secondary | ICD-10-CM | POA: Diagnosis present

## 2023-11-16 LAB — ECHOCARDIOGRAM COMPLETE
AR max vel: 2.86 cm2
AV Area VTI: 2.92 cm2
AV Area mean vel: 2.94 cm2
AV Mean grad: 4 mmHg
AV Peak grad: 7.6 mmHg
Ao pk vel: 1.38 m/s
Area-P 1/2: 3.42 cm2
S' Lateral: 2.54 cm

## 2023-11-21 NOTE — Progress Notes (Deleted)
   [facility]:    Date:  11/21/2023   ID:  JNAYA BUTRICK, DOB 08/14/1970, MRN [id number]  PCP:  Cleotilde, Virginia  E, PA  Cardiologist:  None { Click to update primary MD,subspecialty MD or APP then REFRESH:1}    Referring MD: Cleotilde, Virginia  E, PA   Chief Complaint: shortness of breath and edema  History of Present Illness:    Margaret Lopez is a 53 y.o. female with a history of asthma, GERD, migraines, anxiety, and bioplar disorder who presents today as new patient in the [facility] for further evaluation of shortness of breath and edema.            EKGs/Labs/Other Studies Reviewed:    The following studies were reviewed today: ***  EKG:  EKG *** ordered today. EKG personally reviewed and demonstrates ***.  Recent Labs: 04/13/2023: ALT 5; BUN 12; Creatinine, Ser 0.93; Hemoglobin 12.5; Platelets 265; Potassium 4.4; Sodium 138; TSH 4.990  Recent Lipid Panel No results found for: CHOL, TRIG, HDL, CHOLHDL, VLDL, LDLCALC, LDLDIRECT  Physical Exam:    Vital Signs: There were no vitals taken for this visit.    Wt Readings from Last 3 Encounters:  10/26/20 157 lb (71.2 kg)  06/11/20 156 lb (70.8 kg)  02/06/20 161 lb 9.6 oz (73.3 kg)     General: 53 y.o. female in no acute distress. HEENT: Normocephalic and atraumatic. Sclera clear.  Neck: Supple. No carotid bruits. No JVD. Heart: *** RRR. Distinct S1 and S2. No murmurs, gallops, or rubs.  Lungs: No increased work of breathing. Clear to ausculation bilaterally. No wheezes, rhonchi, or rales.  Abdomen: Soft, non-distended, and non-tender to palpation.  Extremities: No lower extremity edema.  Radial and distal pedal pulses 2+ and equal bilaterally. Skin: Warm and dry. Neuro: No focal deficits. Psych: Normal affect. Responds appropriately.   Assessment:    No diagnosis found.  Plan:     Disposition: Follow up in ***   Signed, Aline FORBES Door, PA-C  11/21/2023 5:39 PM     [facility]

## 2023-11-22 NOTE — Progress Notes (Signed)
 [facility]:    Date:  11/23/2023   ID:  Margaret Lopez, DOB 1970-10-11, MRN [id number]  PCP:  Cleotilde, Virginia  E, PA  Cardiologist:  Vina Gull, MD     Referring MD: Cleotilde, Virginia  E, PA   Chief Complaint: shortness of breath and edema  History of Present Illness:    Margaret Lopez is a 53 y.o. female with a history of hypertension, hyperlipidemia, pre-diabetes, severe obstructive sleep apnea on CPAP, asthma, GERD, migraines, anxiety/ depression, bioplar disorder, obesity s/p gastric bypass surgery in 05/2016, and tobacco abuse who presents today as new patient in the [facility] for further evaluation of shortness of breath and edema.   Patient was recently seen by PCP on 10/19/2023 and reported bilateral lower extremity for the last several months, occasional exertional dyspnea, and significant fatigue. Patient was concerned about CHF given a family history of this. Echo was ordered and showed LVEF of 65-0% with normal wall motion, mild LVH, and grade 1 diastolic dysfunction as well as normal RV function and no significant valvular disease. Patient was referred to Cardiology for further evaluation.   Patient is here with her fianc.  She reports that she has gained about 40 pounds over the last 1.5 years despite no changes in her diet or physical activity level. She previously had gastric bypass surgery in 2018 so this is concerning for her.She previously had gastric bypass surgery in 2018 so this is concerning for her.She reports dyspnea on exertion over the last couple years but states it has been getting progressively worse especially over the last few weeks. She has had to start sleeping with an additional pillow recently and describes orthopnea. She states she feels like she is smothering at night when laying down flat. She had to restart wearing her CPAP machine around this time last year.   She also reports swelling in her lower extremities and abdomen since  around 06/2023.  She was started on HCTZ 25 mg daily for this and then was put on Lasix but only took this about 3 times. She reports chest pain after she gets acutely short of breath and describes this as a feeling like her heart is pounding harder or skipping a beat.  She states this pain occasionally radiates up to her neck. She denies any significant palpitations outside of getting acutely short of breath. No lightheadedness/ dizziness or syncope. She also reports a loss of energy over the last several months. She was diagnosed with hypothyroidism about 6 months ago and was started on Synthroid. Her energy level improved some after starting this.   She has a history of tobacco use but quit in 11/2015.  She reports very minimal alcohol use.  She does have a strong family history of heart disease.  Her father has a history of what sounds like nonischemic cardiomyopathy/CHF and has a pacemaker.  Her paternal grandfather had an MI at the age of 66.  Her maternal grandfather had CAD requiring bypass surgery.  Recent Labs from [facility] on 10/19/2023: - CBC: WBC 5.1, Hgb 11.1, Plts 265 - CMET: Na 139, K 4.7, Glucose 78, BUN 12, Cr 0.80, Albumin 4.5, AST 13, ALT 5, Alk Phos 72, Total Bili 0.3 - Lipid panel: Total Cholesterol 178, Triglycerides 92, HDL 55, LDL 106 - Thyroid  panel: TSH 3.07, Free T4 0.73, T3 101 - Vitamin D : 13.0 (low) - Iron panel: Iron 64, Iron Sat 13 (low), Transferrin 368 (high), Iron Binding Capacity 515 (high),  Ferritin 4.1 (low) - Urinalysis: unremarkable  EKGs/Labs/Other Studies Reviewed:    The following studies were reviewed:  Echocardiogram 11/16/2023: Impressions: 1. Left ventricular ejection fraction, by estimation, is 65 to 70%. Left  ventricular ejection fraction by 3D volume is 67 %. The left ventricle has  normal function. The left ventricle has no regional wall motion  abnormalities. There is mild concentric  left ventricular hypertrophy. Left ventricular  diastolic parameters are  consistent with Grade I diastolic dysfunction (impaired relaxation).  Elevated left ventricular end-diastolic pressure. The average left  ventricular global longitudinal strain is  -19.2 %. The global longitudinal strain is normal.   2. Right ventricular systolic function is normal. The right ventricular  size is normal. There is normal pulmonary artery systolic pressure.   3. The mitral valve is normal in structure. Trivial mitral valve  regurgitation. No evidence of mitral stenosis.   4. The aortic valve is tricuspid. Aortic valve regurgitation is not  visualized. No aortic stenosis is present.   5. The inferior vena cava is normal in size with greater than 50%  respiratory variability, suggesting right atrial pressure of 3 mmHg.   EKG:  EKG ordered today.   EKG Interpretation Date/Time:  Thursday November 23 2023 14:04:05 EST Ventricular Rate:  84 PR Interval:  148 QRS Duration:  90 QT Interval:  364 QTC Calculation: 430 R Axis:   -22  Text Interpretation: Normal sinus rhythm  Non-specific T wave changes When compared with ECG of 22-Jan-2016 10:54, No significant change was found Confirmed by Jadine Patient [phone]) on 11/23/2023 2:14:27 PM    Recent Labs: 04/13/2023: ALT 5; BUN 12; Creatinine, Ser 0.93; Hemoglobin 12.5; Platelets 265; Potassium 4.4; Sodium 138; TSH 4.990  Recent Lipid Panel No results found for: CHOL, TRIG, HDL, CHOLHDL, VLDL, LDLCALC, LDLDIRECT  Physical Exam:    Vital Signs: BP 110/88   Pulse 84   Ht 5' 3 (1.6 m)   Wt 179 lb 6.4 oz (81.4 kg)   SpO2 100%   BMI 31.78 kg/m     Wt Readings from Last 3 Encounters:  11/23/23 179 lb 6.4 oz (81.4 kg)  10/26/20 157 lb (71.2 kg)  06/11/20 156 lb (70.8 kg)     General: 53 y.o. Caucasian female in no acute distress. HEENT: Normocephalic and atraumatic. Sclera clear.  Neck: Supple. No carotid bruits. No JVD. Heart: RRR. Distinct S1 and S2. No murmurs, gallops, or  rubs.  Lungs: No increased work of breathing. Clear to ausculation bilaterally. No wheezes, rhonchi, or rales.  Abdomen: Soft, non-distended, and non-tender to palpation.  Extremities: No lower extremity edema. Diistal pedal pulses 2+ and equal bilaterally. Skin: Warm and dry. Neuro: No focal deficits. Psych: Normal affect. Responds appropriately.  Assessment:    1. Exertional dyspnea   2. Atypical chest pain   3. Lower extremity edema   4. Primary hypertension   5. Hyperlipidemia, unspecified hyperlipidemia type   6. Obstructive sleep apnea   7. Class 1 obesity with body mass index (BMI) of 31.0 to 31.9 in adult, unspecified obesity type, unspecified whether serious comorbidity present     Plan:    Dyspnea on Exertion Atypical Chest Pain Patient describes dyspnea on exertion for the last couple years but states it has gotten progressively worse over the last few weeks.  She describes some chest discomfort that she describes as her heart pounding hard when she gets acutely short of breath.  No real chest pain outside of these times.Recent Echo on 11/16/2023 showed  LVEF of 65-0% with normal wall motion, mild LVH, and grade 1 diastolic dysfunction as well as normal RV function and no significant valvular disease.  - EKG today shows normal sinus rhythm with non-specific T wave changes.  - Euvolemic on exam.  -She also describes a gradual 40 pound weight gain over the last 1.5 years as well as recent lower extremity edema which sounds more like CHF.  However, she does not look volume overloaded on exam.  Will check a BNP.  If this is elevated, will likely restart Lasix. - Chest pain sound very atypical but her dyspnea on exertion could be an anginal equivalent.  She does have a family history of early CAD.  Therefore, we will order a coronary CTA.  Will check a BMETt today.  Will provide a one-time dose of Lopressor 100 mg for patient to take 2 hours prior to CTA. - Patient concerned that  dyspnea on exertion could be due to chronotropic incompetence because her father had this. I think this is less likely. However, she has a smart watch so recommended she monitor what her heart rate does when she ambulates or exerts herself. We can follow up on this at her next visit.   Lower Extremity Edema Patient reports lower extremity edema over the last few months.  - She does not have any significant edema on exam today. She states this is because she elevated her feet all day yesterday.  - Continue HCTZ 25mg  daily.  - Will check BNP as above.. - Suspect she has chronic venous insufficiency. Recommended elevated legs when possible and wearing compression stockings. Also discussed the importance of limiting salt intake.   Hypertension Patient has a history of hypertension listed in records from PCP's office but she denies this. She states she had borderline hypertension prior to her gastric bypass surgery in 2018.  - BP well controlled.  - Continue HCTZ 25mg  daily. Patient states she is on this more for edema than BP.  Hyperlipidemia Recent lipid panel in 10/2023: Total Cholesterol 178, Triglycerides 92, HDL 55 LDL 106. - If coronary CTA shows any CAD, will recommend starting a statin.   Obstructive Sleep Apnea - Continue CPAP.   Obesity S/p gastric bypass surgery in 2018. She has gained about 40 lbs over the last 1.5 years. BMI 31.78.  - She is concerned that weight is due to edema although she does not look significant volume overloaded on exam. We are checking a BNP as above.   Disposition: Follow up in 1 month. Patient would like to get established with Dr. Okey who her father sees.    Signed, Aline FORBES Door, PA-C  11/23/2023 10:21 PM    [facility]

## 2023-11-23 ENCOUNTER — Encounter: Payer: Self-pay | Admitting: Student

## 2023-11-23 ENCOUNTER — Ambulatory Visit: Attending: Student | Admitting: Student

## 2023-11-23 ENCOUNTER — Telehealth: Payer: Self-pay | Admitting: Pharmacist

## 2023-11-23 VITALS — BP 110/88 | HR 84 | Ht 63.0 in | Wt 179.4 lb

## 2023-11-23 DIAGNOSIS — R0789 Other chest pain: Secondary | ICD-10-CM | POA: Insufficient documentation

## 2023-11-23 DIAGNOSIS — Z6831 Body mass index (BMI) 31.0-31.9, adult: Secondary | ICD-10-CM | POA: Diagnosis present

## 2023-11-23 DIAGNOSIS — R600 Localized edema: Secondary | ICD-10-CM | POA: Insufficient documentation

## 2023-11-23 DIAGNOSIS — I10 Essential (primary) hypertension: Secondary | ICD-10-CM | POA: Diagnosis present

## 2023-11-23 DIAGNOSIS — R0609 Other forms of dyspnea: Secondary | ICD-10-CM | POA: Diagnosis not present

## 2023-11-23 DIAGNOSIS — E785 Hyperlipidemia, unspecified: Secondary | ICD-10-CM | POA: Insufficient documentation

## 2023-11-23 DIAGNOSIS — E66811 Obesity, class 1: Secondary | ICD-10-CM | POA: Diagnosis present

## 2023-11-23 DIAGNOSIS — G4733 Obstructive sleep apnea (adult) (pediatric): Secondary | ICD-10-CM | POA: Diagnosis present

## 2023-11-23 MED ORDER — METOPROLOL TARTRATE 100 MG PO TABS: 100.0000 mg | ORAL_TABLET | Freq: Once | ORAL | 0 refills | Status: AC

## 2023-11-23 NOTE — Patient Instructions (Signed)
 Medication Instructions:  Your physician recommends that you continue on your current medications as directed. Please refer to the Current Medication list given to you today.  *If you need a refill on your cardiac medications before your next appointment, please call your pharmacy*  Lab Work: TODAY:  BMET & PRO BNP  If you have labs (blood work) drawn today and your tests are completely normal, you will receive your results only by: MyChart Message (if you have MyChart) OR A paper copy in the mail If you have any lab test that is abnormal or we need to change your treatment, we will call you to review the results.  Testing/Procedures: Your physician has requested that you have cardiac CT. Cardiac computed tomography (CT) is a painless test that uses an x-ray machine to take clear, detailed pictures of your heart. For further information please visit [email or web address] Please follow instruction sheet  BELOW:    Your cardiac CT will be scheduled at one of the below locations:   [facility] [address] [place], [place] [address] [phone] (Severe contrast allergies only)  OR   [facility] [address] [place], [place] [address] [phone]  OR   [facility] [address] [place], [place] [address] [phone]  OR   [facility] and V[facility] Tower [address]  [place], [place] [address]  OR   [facility] [address] [place], [place] [id number]  If scheduled at [facility], please arrive at the [facility] and Children's Entrance (Entrance C2) of [facility] 30 minutes prior to test start time. You can use the FREE valet parking offered at entrance C (encouraged to control the heart rate for the test)  Proceed to the [facility] Radiology Department (first floor) to check-in and test prep.  All radiology patients and guests should use entrance C2 at [facility], accessed from [facility], even though the hospital's physical address listed is [address]  If scheduled at the Heart and Vascular Tower at [address], please enter the parking lot using the [address] entrance and use the FREE valet service at the patient drop-off area. Enter the building and check-in with registration on the main floor.  If scheduled at [facility], please arrive to the Heart and Vascular Center 15 mins early for check-in and test prep.  There is spacious parking and easy access to the radiology department from the [facility] entrance. Please enter here and check-in with the desk attendant.   If scheduled at [facility], please arrive 30 minutes early for check-in and test prep.  Please follow these instructions carefully (unless otherwise directed):  An IV will be required for this test and Nitroglycerin will be given.    On the Night Before the Test: Be sure to Drink plenty of water. Do not consume any caffeinated/decaffeinated beverages or chocolate 12 hours prior to your test. Do not take any antihistamines 12 hours prior to your test.  On the Day of the Test: Drink plenty of water until 1 hour prior to the test. Do not eat any food 1 hour prior to test. You may take your regular medications prior to the test.  Take metoprolol (Lopressor) 100 MG  two hours prior to test. THIS HAS BEEN SENT TO [facility]  Hydrochlorothiazide . Patients who wear a continuous glucose monitor MUST remove the device prior to scanning.  FEMALES- please wear underwire-free bra if available, avoid dresses & tight clothing   After the Test: Drink plenty of water. After receiving IV contrast, you may experience a mild flushed feeling. This is normal. On occasion, you may experience a mild rash up to 24 hours after the test. This is not dangerous. If this occurs, you can take Benadryl  25 mg, Zyrtec,  Claritin, or Allegra and increase your fluid intake. (Patients taking Tikosyn should avoid Benadryl , and may take Zyrtec, Claritin, or Allegra) If you experience trouble breathing, this can be serious. If it is severe call 911 IMMEDIATELY. If it is mild, please call our office.  We will call to schedule your test 2-4 weeks out understanding that some insurance companies will need an authorization prior to the service being performed.   For more information and frequently asked questions, please visit our website : [email or web address]  For non-scheduling related questions, please contact the cardiac imaging nurse navigator should you have any questions/concerns: Cardiac Imaging Nurse Navigators Direct Office Dial: [phone]   For scheduling needs, including cancellations and rescheduling, please call Brittany, [phone].     Follow-Up: At [facility], you and your health needs are our priority.  As part of our continuing mission to provide you with exceptional heart care, our providers are all part of one team.  This team includes your primary Cardiologist (physician) and Advanced Practice Providers or APPs (Physician Assistants and Nurse Practitioners) who all work together to provide you with the care you need, when you need it.  Your next appointment:   1 month(s)  Provider:   Vina Gull, MD    We recommend signing up for the patient portal called MyChart.  Sign up information is provided on this After Visit Summary.  MyChart is used to connect with patients for Virtual Visits (Telemedicine).  Patients are able to view lab/test results, encounter notes, upcoming appointments, etc.  Non-urgent messages can be sent to your provider as well.   To learn more about what you can do with MyChart, go to [email or web address].   Other Instructions We recommend you get some compression stockings and wear them daily

## 2023-11-23 NOTE — Telephone Encounter (Signed)
 Pharmacy Patient Advocate Encounter   Received notification from CoverMyMeds that prior authorization for Nurtec 75MG  dispersible tablets is required/requested.   Insurance verification completed.   The patient is insured through [employer].   Per test claim: PA required; PA submitted to above mentioned insurance via Latent Key/confirmation #/[id number] Status is pending

## 2023-11-24 LAB — BASIC METABOLIC PANEL WITH GFR
BUN/Creatinine Ratio: 12 (ref 9–23)
BUN: 11 mg/dL (ref 6–24)
CO2: 22 mmol/L (ref 20–29)
Calcium: 9.8 mg/dL (ref 8.7–10.2)
Chloride: 103 mmol/L (ref 96–106)
Creatinine, Ser: 0.9 mg/dL (ref 0.57–1.00)
Glucose: 77 mg/dL (ref 70–99)
Potassium: 4.6 mmol/L (ref 3.5–5.2)
Sodium: 141 mmol/L (ref 134–144)
eGFR: 77 mL/min/1.73 (ref >59)

## 2023-11-24 LAB — PRO B NATRIURETIC PEPTIDE: NT-Pro BNP: 71 pg/mL (ref 0–249)

## 2023-11-26 ENCOUNTER — Ambulatory Visit: Payer: Self-pay | Admitting: Student

## 2023-11-27 ENCOUNTER — Other Ambulatory Visit (HOSPITAL_COMMUNITY): Payer: Self-pay

## 2023-11-27 ENCOUNTER — Ambulatory Visit: Admitting: Student

## 2023-11-29 ENCOUNTER — Other Ambulatory Visit (HOSPITAL_BASED_OUTPATIENT_CLINIC_OR_DEPARTMENT_OTHER): Payer: Self-pay

## 2023-11-30 MED ORDER — METHYLPREDNISOLONE 4 MG PO TBPK: ORAL_TABLET | ORAL | 0 refills | Status: AC

## 2023-11-30 NOTE — Addendum Note (Signed)
 Addended by: SHERRYL DUWAINE SQUIBB on: 11/30/2023 08:30 AM   Modules accepted: Orders

## 2023-12-13 ENCOUNTER — Encounter (HOSPITAL_COMMUNITY): Payer: Self-pay

## 2023-12-13 ENCOUNTER — Other Ambulatory Visit (HOSPITAL_COMMUNITY): Payer: Self-pay

## 2023-12-13 DIAGNOSIS — F319 Bipolar disorder, unspecified: Secondary | ICD-10-CM

## 2023-12-13 MED ORDER — LITHIUM CARBONATE ER 300 MG PO TBCR: 600.0000 mg | EXTENDED_RELEASE_TABLET | Freq: Every evening | ORAL | 0 refills | Status: DC

## 2023-12-13 MED ORDER — DIVALPROEX SODIUM ER 500 MG PO TB24: 1500.0000 mg | ORAL_TABLET | Freq: Every day | ORAL | 0 refills | Status: DC

## 2023-12-15 ENCOUNTER — Ambulatory Visit (HOSPITAL_COMMUNITY)
Admission: RE | Admit: 2023-12-15 | Discharge: 2023-12-15 | Disposition: A | Source: Ambulatory Visit | Attending: Student

## 2023-12-15 DIAGNOSIS — R0609 Other forms of dyspnea: Secondary | ICD-10-CM

## 2023-12-15 DIAGNOSIS — R0789 Other chest pain: Secondary | ICD-10-CM

## 2023-12-15 MED ORDER — NITROGLYCERIN 0.4 MG SL SUBL
0.8000 mg | SUBLINGUAL_TABLET | Freq: Once | SUBLINGUAL | Status: AC
  Administered 2023-12-15: 0.8 mg via SUBLINGUAL

## 2023-12-15 MED ORDER — IOHEXOL 350 MG/ML SOLN
100.0000 mL | Freq: Once | INTRAVENOUS | Status: AC | PRN
  Administered 2023-12-15: 100 mL via INTRAVENOUS

## 2023-12-18 ENCOUNTER — Telehealth (HOSPITAL_COMMUNITY): Admitting: Family

## 2023-12-18 DIAGNOSIS — F319 Bipolar disorder, unspecified: Secondary | ICD-10-CM

## 2023-12-18 DIAGNOSIS — F411 Generalized anxiety disorder: Secondary | ICD-10-CM

## 2023-12-18 DIAGNOSIS — F902 Attention-deficit hyperactivity disorder, combined type: Secondary | ICD-10-CM

## 2023-12-18 MED ORDER — ARIPIPRAZOLE 5 MG PO TABS: 5.0000 mg | ORAL_TABLET | Freq: Every day | ORAL | 0 refills | Status: DC

## 2023-12-18 NOTE — Progress Notes (Signed)
 Virtual Visit via Video Note  I connected with Margaret Lopez on 12/18/23 at  1:30 PM EST by a video enabled telemedicine application and verified that I am speaking with the correct person using two identifiers.  Location: Patient: Home  Provider: Office   I discussed the limitations of evaluation and management by telemedicine and the availability of in person appointments. The patient expressed understanding and agreed to proceed.  I discussed the assessment and treatment plan with the patient. The patient was provided an opportunity to ask questions and all were answered. The patient agreed with the plan and demonstrated an understanding of the instructions.   The patient was advised to call back or seek an in-person evaluation if the symptoms worsen or if the condition fails to improve as anticipated.  I provided 18 minutes of non-face-to-face time during this encounter.   Staci LOISE Kerns, NP    [facility] MD/PA/NP OP Progress Note  12/18/2023 1:57 PM Margaret Lopez  MRN:  [id number]  Chief Complaint:  Margaret Lopez stated  I have been feeling more depressed lately.    HPI:  Margaret Lopez 53 year old female presents for medication management follow-up appointment.  Seen and evaluated via care agility virtual platform.  Juan expressed concerns with mood instability and increased depression.  Reports a attributing her mood to recently starting and stabilizing on menopause medication through her OB/GYN.  Stated that she was followed by her primary care and states she was initiated on Synthroid however states she is unable to tolerate medication as she has not had her Synthroid medication in a few months.  Reports she has been scheduled for follow-up appointment with a new primary care provider  [place] is rating her depression 6-7 out of 10 with 10 being the worst.  Reports she and her significant other have been together for 9 years is currently going through a rough patch.  Zykia reports she continues  to take Depakote  and lithium  as directed.  Discussed initiating Abilify  5 mg daily to current medication regimen.  Encouraged to keep follow-up appointment with endocrinology/primary care to be restarted on Synthroid medications.  Continue to monitor symptoms.  [name] states she has not started Strattera  as she is trying to get her mood under control.  Will continue to monitor symptoms.  Follow-up 2 months.  Support encouragement reassurance was provided.   Visit Diagnosis:    ICD-10-CM   1. Bipolar 1 disorder, depressed (HCC)  F31.9     2. Attention deficit hyperactivity disorder, combined type, moderate  F90.2     3. GAD (generalized anxiety disorder)  F41.1       Past Psychiatric History: HO: Bipolar 1 disorder, attention deficit disorder, major depressive disorder, generalized anxiety disorder.  Previous inpatient admissions for bipolar disorder. - Has tried Wellbutrin , Adderall (intensifies manic symptoms), melatonin.  Xanax . [facility]  Past Medical History:  Past Medical History:  Diagnosis Date   Anxiety    Asthma    Bipolar disorder (HCC)    Depression    Deviated nasal septum 02/2011   Generalized anxiety disorder 04/16/2013   GERD (gastroesophageal reflux disease)    Headache(784.0)    migraines   Insomnia    Nasal turbinate hypertrophy 02/2011   bilat.   Neuromuscular disorder (HCC)    Dr. CHRISTELLA. One[facility]- does injections around nerve in her head for prevention of migraines - q 3 months    Pneumonia    hx of    PONV (postoperative  nausea and vomiting)    Seasonal allergies     Past Surgical History:  Procedure Laterality Date   ABDOMINAL HYSTERECTOMY     CARPAL TUNNEL RELEASE Right    DIAGNOSTIC LAPAROSCOPY     EXCISION MASS LOWER EXTREMETIES Left 06/21/2018   Procedure: Excision of left upper leg fat necrosis;  Surgeon: Lowery Estefana RAMAN, DO;  Location: [facility];  Service: Plastics;  Laterality: Left;   FOOT  SURGERY Right    bone removed from great toe    GASTRIC ROUX-EN-Y N/A 05/16/2016   Procedure: LAPAROSCOPIC ROUX-EN-Y GASTRIC BYPASS WITH UPPER ENDOSCOPY;  Surgeon: Signe Mitzie LABOR, MD;  Location: [facility];  Service: General;  Laterality: N/A;   LAPAROSCOPIC VAGINAL HYSTERECTOMY  03/14/2007   LIPOMA EXCISION Left 08/26/2016   Procedure: EXCISION LIPOMA FROM LEFT UPPER POSTERIOR THIGH;  Surgeon: Signe Mitzie LABOR, MD;  Location: [facility];  Service: General;  Laterality: Left;   MYRINGOTOMY WITH TUBE PLACEMENT Bilateral 03/02/2015   Procedure: MYRINGOTOMY WITH T-TUBE PLACEMENT;  Surgeon: Daniel Moccasin, MD;  Location: [facility];  Service: ENT;  Laterality: Bilateral;   NASAL SEPTOPLASTY W/ TURBINOPLASTY  02/22/2011   Procedure: NASAL SEPTOPLASTY WITH TURBINATE REDUCTION;  Surgeon: Ana LELON Moccasin, MD;  Location: [facility];  Service: ENT;  Laterality: Bilateral;   TUBAL LIGATION  12/16/2004   TYMPANOSTOMY TUBE PLACEMENT Bilateral 07/21/2017    Family Psychiatric History:   Family History:  Family History  Problem Relation Age of Onset   ADD / ADHD Sister    Hypertension Mother    Sarcoidosis Mother    Rheum arthritis Mother    Hypertension Father    Heart Problems Father    Alcohol abuse Maternal Grandmother    Bipolar disorder Maternal Grandmother    Breast cancer Maternal Grandmother    Breast cancer Maternal Aunt    Heart Problems Other        dad's side of family   Suicidality Neg Hx     Social History:  Social History   Socioeconomic History   Marital status: Legally Separated    Spouse name: Not on file   Number of children: 2   Years of education: Not on file   Highest education level: Some college, no degree  Occupational History   Not on file  Tobacco Use   Smoking status: Former    Current packs/day: 0.00    Average packs/day: 0.5 packs/day for 4.0 years (2.0 ttl pk-yrs)    Types: Cigarettes    Start date: 11/12/2011    Quit date: 11/12/2015    Years  since quitting: 8.1   Smokeless tobacco: Never  Vaping Use   Vaping status: Never Used  Substance and Sexual Activity   Alcohol use: No    Alcohol/week: 0.0 standard drinks of alcohol    Comment: once a month or less   Drug use: No   Sexual activity: Yes    Partners: Male    Birth control/protection: Surgical    Comment: Hysterectomy  Other Topics Concern   Not on file  Social History Narrative   Lives at home with fiance', his son, and her children come and go from the home (35 y.o. and 51 y.o.)   Right handed   Caffeine: 2 cups daily   Social Drivers of Health   Financial Resource Strain: Not on file  Food Insecurity: Not on file  Transportation Needs: Not on file  Physical Activity: Sufficiently Active (11/17/2016)   Exercise  Vital Sign    Days of Exercise per Week: 5 days    Minutes of Exercise per Session: 40 min  Stress: No Stress Concern Present (11/17/2016)   [employer] of Occupational Health - Occupational Stress Questionnaire    Feeling of Stress : Only a little  Social Connections: Not on file    Allergies:  Allergies  Allergen Reactions   Doxazosin Nausea And Vomiting   Morphine  And Codeine Itching   Phenergan [Promethazine Hcl]      knocks out for several hours   Aloe Hives   Penicillins Rash    Has patient had a PCN reaction causing immediate rash, facial/tongue/throat swelling, SOB or lightheadedness with hypotension: No Has patient had a PCN reaction causing severe rash involving mucus membranes or skin necrosis: No Has patient had a PCN reaction that required hospitalization No Has patient had a PCN reaction occurring within the last 10 years: No If all of the above answers are NO, then may proceed with Cephalosporin use.    Sulfa Antibiotics Rash    Metabolic Disorder Labs: No results found for: HGBA1C, MPG No results found for: PROLACTIN No results found for: CHOL, TRIG, HDL, CHOLHDL, VLDL, LDLCALC Lab Results   Component Value Date   TSH 4.990 (H) 04/13/2023   TSH 4.610 (H) 05/31/2022    Therapeutic Level Labs: Lab Results  Component Value Date   LITHIUM  1.2 04/13/2023   LITHIUM  1.0 05/31/2022   Lab Results  Component Value Date   VALPROATE 61 04/13/2023   VALPROATE 91 05/31/2022   No results found for: CBMZ  Current Medications: Current Outpatient Medications  Medication Sig Dispense Refill   ARIPiprazole  (ABILIFY ) 5 MG tablet Take 1 tablet (5 mg total) by mouth daily. 90 tablet 0   albuterol  (PROVENTIL  HFA;VENTOLIN  HFA) 108 (90 Base) MCG/ACT inhaler Inhale 1-2 puffs into the lungs every 6 (six) hours as needed for wheezing or shortness of breath.     botulinum toxin Type A  (BOTOX ) 100 units SOLR injection Provider to inject 155 units into the muscles of the head and neck every 3 months. Discard remainder. 1 each 3   botulinum toxin Type A  (BOTOX ) 100 units SOLR injection Provider to inject 155 units into head and neck muscles every 12 weeks, discard remainder 2 each 3   cyclobenzaprine  (FLEXERIL ) 10 MG tablet TAKE 1/2 TO 1 TABLET(5 TO 10 MG) BY MOUTH AT BEDTIME AS NEEDED 30 tablet 14   divalproex  (DEPAKOTE  ER) 500 MG 24 hr tablet Take 3 tablets (1,500 mg total) by mouth daily. 180 tablet 0   estradiol  (VIVELLE -DOT) 0.05 MG/24HR patch Place 1 patch (0.05 mg total) onto the skin 2 (two) times a week. 24 patch 3   famotidine  (PEPCID ) 20 MG tablet Take 1 tablet (20 mg total) by mouth 2 (two) times daily. 180 tablet 0   fluocinonide  cream (LIDEX ) 0.05 % Apply 1 Application topically to affected area 2 (two) times daily. 60 g 0   gabapentin  (NEURONTIN ) 100 MG capsule TAKE 1 CAPSULE BY MOUTH THREE TIMES A DAY AS NEEDED 90 capsule 6   hydrochlorothiazide  (HYDRODIURIL ) 25 MG tablet Take 1 tablet (25 mg total) by mouth daily in the morning. 90 tablet 0   hydrOXYzine  (ATARAX /VISTARIL ) 25 MG tablet Take 25 mg by mouth at bedtime as needed for itching.  3   ipratropium (ATROVENT) 0.06 % nasal  spray Place 2 sprays into both nostrils daily as needed. Allergies, runny nose.  4   ketorolac  (TORADOL ) 60 MG/2ML SOLN  injection Inject 1-2ml (30-60mg ) intramuscularly at onset of migraine. May repeat in 6 hours. Max twice a day and 4 days per month. 10 mL 4   levocetirizine (XYZAL) 5 MG tablet Take 5 mg by mouth at bedtime.      levothyroxine (SYNTHROID) 25 MCG tablet Take 25 mcg by mouth every morning.     lithium  carbonate (LITHOBID ) 300 MG ER tablet Take 2 tablets (600 mg total) by mouth every evening. 60 tablet 0   methylPREDNISolone  (MEDROL  DOSEPAK) 4 MG TBPK tablet TAKE AS DIRECTED WITH FOOD 21 each 0   metoprolol  tartrate (LOPRESSOR ) 100 MG tablet Take 1 tablet (100 mg total) by mouth once for 1 dose. Take 90-120 minutes prior to scan. Hold for SBP less than 110. 1 tablet 0   Multiple Vitamins-Minerals (OPURITY BYPASS OPTIMIZED) CHEW Chew 1 tablet by mouth daily.     ondansetron  (ZOFRAN -ODT) 8 MG disintegrating tablet Take 1 tablet (8 mg total) by mouth every 8 (eight) hours as needed. 30 tablet 1   prochlorperazine  (COMPAZINE ) 10 MG tablet Take 1 tablet (10 mg total) by mouth 3 (three) times daily as needed for nausea or vomiting (migraine). MAY CAUSE SEDATION. 30 tablet 3   propranolol  (INDERAL ) 10 MG tablet Take 1 tablet (10 mg total) by mouth 2 (two) times daily. (Patient not taking: Reported on 11/23/2023) 60 tablet 0   Rimegepant Sulfate (NURTEC) 75 MG TBDP Take 1 tablet (75 mg total) by mouth daily as needed. For migraines. Take as close to onset of migraine as possible. One daily maximum. 16 tablet 11   sodium chloride  0.9 % SOLN 100 mL with Eptinezumab-jjmr 100 MG/ML SOLN 100 mg Inject 100 mg into the vein every 3 (three) months. 100 mg 4   SYRINGE-NEEDLE, DISP, 3 ML (BD INTE[employer]RA SYRINGE) 25G X 1 3 ML MISC ATTACH NEEDLE TO SYRINGE AND USE TO DRAW UP AND ADMINISTER TORADOL . DO NOT REUSE. 4 each 0   valACYclovir (VALTREX) 500 MG tablet Take 500 mg by mouth See admin instructions. Takes  500mg  daily for 5 days when she has a flare up.  3   No current facility-administered medications for this visit.     Musculoskeletal: Virtual assessment    Psychiatric Specialty Exam: Review of Systems  Psychiatric/Behavioral:  Positive for decreased concentration. Negative for sleep disturbance. The patient is not nervous/anxious.   All other systems reviewed and are negative.   There were no vitals taken for this visit.There is no height or weight on file to calculate BMI.  General Appearance: Casual  Eye Contact:  Good  Speech:  Clear and Coherent  Volume:  Normal  Mood:  Anxious and Depressed  Affect:  Congruent  Thought Process:  Coherent  Orientation:  Full (Time, Place, and Person)  Thought Content: Logical   Suicidal Thoughts:  No  Homicidal Thoughts:  No  Memory:  Immediate;   Good Recent;   Good  Judgement:  Good  Insight:  Good  Psychomotor Activity:  Normal  Concentration:  Concentration: Good  Recall:  Good  Fund of Knowledge: Good  Language: Good  Akathisia:  No  Handed:  Right  AIMS (if indicated): not done  Assets:  Communication Skills Desire for Improvement  ADL's:  Intact  Cognition: WNL  Sleep:  Fair   Screenings: PHQ2-9    Flowsheet Row Office Visit [facility]/17/20[facility] in [facility] Video Visit from 03/31/2022 in [facility] Video Visit from [facility]21 [facility] Video Visit from 07/15/2021 in [facility] Video Visit from 04/29/2021 in [facility]  PHQ-2 Total Score 3 0 0 0 2  PHQ-9 Total Score 18 -- -- -- 3   Flowsheet Row Video Visit from 03/31/2022 in [facility] Video Visit from 10/14/2021 in [facility] Video Visit from 07/15/2021 in [facility]  C-SSRS RISK CATEGORY No Risk No Risk No Risk     Assessment and Plan:  Angles Trevizo 53 year old female presents for medication management follow-up appointment.  Reports worsening depression and mood instability.  Multiple causes related to mood irritability to include recently starting medication for menopause and has not been taking Synthroid due to GI upset.  Initiated Abilify  5 mg to current medication regimen.  Jazmine to continue lithium  and Depakote  as directed.   Reports she has not reinitiated Strattera  due to mood instability.  Follow-up 2 months for medication adherence/tolerability  Collaboration of Care: Collaboration of Care: Medication Management AEB initiated Abilify  for mood stabilization/depression  Patient/Guardian was advised Release of Information must be obtained prior to any record release in order to collaborate their care with an outside provider. Patient/Guardian was advised if they have not already done so to contact the registration department to sign all necessary forms in order for us  to release information regarding their care.   Consent: Patient/Guardian gives verbal consent for treatment and assignment of benefits for services provided during this visit. Patient/Guardian expressed understanding and agreed to proceed.    Staci LOISE Kerns, NP 12/18/2023, 1:57 PM

## 2023-12-24 NOTE — Progress Notes (Unsigned)
 Cardiology Office Note   Date:  12/25/2023   ID:  Margaret Lopez, DOB [birth year], MRN [id number]  PCP:  Cleotilde Bessie BRAVO, PA  Cardiologist:   Vina Gull, MD   Pt presents for follow up of dyspnea      History of Present Illness: Margaret Lopez is a 53 y.o. female with a history of HTN, HL, OSA (on CPAP), asthma, GERD, obesity (s/p gastric bypass),   Oct 2025  Pt complained of LE edema, DOE.  Echo showed LVEF 65%  mld LVH  Normal RVEF    Nov 2025    The pt was seen in cardiology for the first time by  C Goodrich  Set up for a CCTA    Dec 2025  CCTA  Ca score 0   Normal coronary arteries     She is presenting for follow up to review   She says she still gets SOB with activity   She also complains of  LE edema    Denies varicose veins  No CP      Note patient had been prescribed thyroid  supplement last spring   COmplained of diarrhea after    Quit supplement 2 wks ago    Pt says wt has gone up from 145 to 180 over past year  Has been on Estrogen patch for about 1 year   (no uterus)  Active Medications[1]   Allergies:   Doxazosin, Morphine  and codeine, Phenergan [promethazine hcl], Aloe, Penicillins, and Sulfa antibiotics   Past Medical History:  Diagnosis Date   Anxiety    Asthma    Bipolar disorder (HCC)    Depression    Deviated nasal septum 02/2011   Generalized anxiety disorder 04/16/2013   GERD (gastroesophageal reflux disease)    Headache(784.0)    migraines   Insomnia    Nasal turbinate hypertrophy 02/2011   bilat.   Neuromuscular disorder (HCC)    Dr. CHRISTELLA. Oneita- does injections around nerve in her head for prevention of migraines - q 3 months    Pneumonia    hx of    PONV (postoperative nausea and vomiting)    Seasonal allergies     Past Surgical History:  Procedure Laterality Date   ABDOMINAL HYSTERECTOMY     CARPAL TUNNEL RELEASE Right    DIAGNOSTIC LAPAROSCOPY     EXCISION MASS LOWER EXTREMETIES Left 06/21/2018   Procedure: Excision of left upper  leg fat necrosis;  Surgeon: Lowery Estefana RAMAN, DO;  Location: [facility];  Service: Plastics;  Laterality: Left;   FOOT SURGERY Right    bone removed from great toe    GASTRIC ROUX-EN-Y N/A 05/16/2016   Procedure: LAPAROSCOPIC ROUX-EN-Y GASTRIC BYPASS WITH UPPER ENDOSCOPY;  Surgeon: Signe Mitzie LABOR, MD;  Location: [facility];  Service: General;  Laterality: N/A;   LAPAROSCOPIC VAGINAL HYSTERECTOMY  03/14/2007   LIPOMA EXCISION Left 08/26/2016   Procedure: EXCISION LIPOMA FROM LEFT UPPER POSTERIOR THIGH;  Surgeon: Signe Mitzie LABOR, MD;  Location: [facility];  Service: General;  Laterality: Left;   MYRINGOTOMY WITH TUBE PLACEMENT Bilateral 03/02/2015   Procedure: MYRINGOTOMY WITH T-TUBE PLACEMENT;  Surgeon: Daniel Moccasin, MD;  Location: [facility];  Service: ENT;  Laterality: Bilateral;   NASAL SEPTOPLASTY W/ TURBINOPLASTY  02/22/2011   Procedure: NASAL SEPTOPLASTY WITH TURBINATE REDUCTION;  Surgeon: Ana LELON Moccasin, MD;  Location: [facility];  Service: ENT;  Laterality: Bilateral;   TUBAL LIGATION  12/16/2004   TYMPANOSTOMY TUBE  PLACEMENT Bilateral 07/21/2017     Social History:  The patient  reports that she quit smoking about 8 years ago. Her smoking use included cigarettes. She started smoking about 12 years ago. She has a 2 pack-year smoking history. She has never used smokeless tobacco. She reports that she does not drink alcohol and does not use drugs.   Family History:  The patient's family history includes ADD / ADHD in her sister; Alcohol abuse in her maternal grandmother; Bipolar disorder in her maternal grandmother; Breast cancer in her maternal aunt and maternal grandmother; Heart Problems in her father and another family member; Hypertension in her father and mother; Rheum arthritis in her mother; Sarcoidosis in her mother.    ROS:  Please see the history of present illness. All other systems are reviewed and  Negative to the above problem except as noted.     PHYSICAL EXAM: VS:  BP 108/70 (BP Location: Right Arm, Patient Position: Sitting, Cuff Size: Normal)   Pulse 81   Ht 5' 3 (1.6 m)   Wt 184 lb (83.5 kg)   SpO2 99%   BMI 32.59 kg/m   GEN: Obese 53 yo in NAD  HEENT: normal  Neck: no JVD, no carotid bruit Cardiac: RRR; no murmurs  Respiratory:  clear to auscultation  GI: soft, nontender, no masses  No hepatomegaly  MS: no deformity Moving all extremities   Ext  No LE edema  2+ PT pulses EKG:  EKG is ordered today.  Echocardiogram 11/16/2023: Impressions: 1. Left ventricular ejection fraction, by estimation, is 65 to 70%. Left  ventricular ejection fraction by 3D volume is 67 %. The left ventricle has  normal function. The left ventricle has no regional wall motion  abnormalities. There is mild concentric  left ventricular hypertrophy. Left ventricular diastolic parameters are  consistent with Grade I diastolic dysfunction (impaired relaxation).  Elevated left ventricular end-diastolic pressure. The average left  ventricular global longitudinal strain is  -19.2 %. The global longitudinal strain is normal.   2. Right ventricular systolic function is normal. The right ventricular  size is normal. There is normal pulmonary artery systolic pressure.   3. The mitral valve is normal in structure. Trivial mitral valve  regurgitation. No evidence of mitral stenosis.   4. The aortic valve is tricuspid. Aortic valve regurgitation is not  visualized. No aortic stenosis is present.   5. The inferior vena cava is normal in size with greater than 50%  respiratory variability, suggesting right atrial pressure of 3 mmHg.    Lipid Panel No results found for: CHOL, TRIG, HDL, CHOLHDL, VLDL, LDLCALC, LDLDIRECT    Wt Readings from Last 3 Encounters:  12/25/23 184 lb (83.5 kg)  11/23/23 179 lb 6.4 oz (81.4 kg)  10/26/20 157 lb (71.2 kg)      ASSESSMENT AND PLAN:  1  DOE   Pt with DOE   I have reviewed echo  LVEF and  RVEF are normal   Diastolic function is not Gr I Normal to indeterminate    I am not convinced it is a cause for dyspnea CCTA is also normal     I am not convinced of microvascular dz Moving air well  Will get ANA and ESR  2  LE edema  No varicosities on exam  She is on hydrochlorothiazide  for BP    Not helping   Will reviewe labs  Could try lasix 20 or 40 mg 1x per week  Recomm she use support socks  LIMit Na   3  Lpids   LDL 106  HDL 55  Trig 92    4 Metabolics  Will check A1C     Follow up TBD   Current medicines are reviewed at length with the patient today.  The patient does not have concerns regarding medicines.  Signed, Vina Gull, MD  12/25/2023 5:40 PM       [1]  Current Meds  Medication Sig   albuterol  (PROVENTIL  HFA;VENTOLIN  HFA) 108 (90 Base) MCG/ACT inhaler Inhale 1-2 puffs into the lungs every 6 (six) hours as needed for wheezing or shortness of breath.   ARIPiprazole  (ABILIFY ) 5 MG tablet Take 1 tablet (5 mg total) by mouth daily.   botulinum toxin Type A  (BOTOX ) 100 units SOLR injection Provider to inject 155 units into the muscles of the head and neck every 3 months. Discard remainder.   botulinum toxin Type A  (BOTOX ) 100 units SOLR injection Provider to inject 155 units into head and neck muscles every 12 weeks, discard remainder   cyclobenzaprine  (FLEXERIL ) 10 MG tablet TAKE 1/2 TO 1 TABLET(5 TO 10 MG) BY MOUTH AT BEDTIME AS NEEDED   divalproex  (DEPAKOTE  ER) 500 MG 24 hr tablet Take 3 tablets (1,500 mg total) by mouth daily.   estradiol  (VIVELLE -DOT) 0.05 MG/24HR patch Place 1 patch (0.05 mg total) onto the skin 2 (two) times a week.   famotidine  (PEPCID ) 20 MG tablet Take 1 tablet (20 mg total) by mouth 2 (two) times daily.   fluocinonide  cream (LIDEX ) 0.05 % Apply 1 Application topically to affected area 2 (two) times daily.   gabapentin  (NEURONTIN ) 100 MG capsule TAKE 1 CAPSULE BY MOUTH THREE TIMES A DAY AS NEEDED   hydrochlorothiazide  (HYDRODIURIL ) 25 MG  tablet Take 1 tablet (25 mg total) by mouth daily in the morning.   hydrOXYzine  (ATARAX /VISTARIL ) 25 MG tablet Take 25 mg by mouth at bedtime as needed for itching.   ipratropium (ATROVENT) 0.06 % nasal spray Place 2 sprays into both nostrils daily as needed. Allergies, runny nose.   ketorolac  (TORADOL ) 60 MG/2ML SOLN injection Inject 1-2ml (30-60mg ) intramuscularly at onset of migraine. May repeat in 6 hours. Max twice a day and 4 days per month.   levocetirizine (XYZAL) 5 MG tablet Take 5 mg by mouth at bedtime.    levothyroxine (SYNTHROID) 25 MCG tablet Take 25 mcg by mouth every morning.   lithium  carbonate (LITHOBID ) 300 MG ER tablet Take 2 tablets (600 mg total) by mouth every evening.   methylPREDNISolone  (MEDROL  DOSEPAK) 4 MG TBPK tablet TAKE AS DIRECTED WITH FOOD   metoprolol  tartrate (LOPRESSOR ) 100 MG tablet Take 1 tablet (100 mg total) by mouth once for 1 dose. Take 90-120 minutes prior to scan. Hold for SBP less than 110.   Multiple Vitamins-Minerals (OPURITY BYPASS OPTIMIZED) CHEW Chew 1 tablet by mouth daily.   ondansetron  (ZOFRAN -ODT) 8 MG disintegrating tablet Take 1 tablet (8 mg total) by mouth every 8 (eight) hours as needed.   prochlorperazine  (COMPAZINE ) 10 MG tablet Take 1 tablet (10 mg total) by mouth 3 (three) times daily as needed for nausea or vomiting (migraine). MAY CAUSE SEDATION.   propranolol  (INDERAL ) 10 MG tablet Take 1 tablet (10 mg total) by mouth 2 (two) times daily.   Rimegepant Sulfate (NURTEC) 75 MG TBDP Take 1 tablet (75 mg total) by mouth daily as needed. For migraines. Take as close to onset of migraine as possible. One daily maximum.   sodium chloride  0.9 % SOLN 100 mL  with Eptinezumab-jjmr 100 MG/ML SOLN 100 mg Inject 100 mg into the vein every 3 (three) months.   SYRINGE-NEEDLE, DISP, 3 ML (BD INTEGRA SYRINGE) 25G X 1 3 ML MISC ATTACH NEEDLE TO SYRINGE AND USE TO DRAW UP AND ADMINISTER TORADOL . DO NOT REUSE.   valACYclovir (VALTREX) 500 MG tablet Take 500  mg by mouth See admin instructions. Takes 500mg  daily for 5 days when she has a flare up.

## 2023-12-25 ENCOUNTER — Encounter: Payer: Self-pay | Admitting: Internal Medicine

## 2023-12-25 ENCOUNTER — Ambulatory Visit: Attending: Internal Medicine | Admitting: Internal Medicine

## 2023-12-25 VITALS — BP 108/70 | HR 81 | Ht 63.0 in | Wt 184.0 lb

## 2023-12-25 DIAGNOSIS — E785 Hyperlipidemia, unspecified: Secondary | ICD-10-CM | POA: Diagnosis not present

## 2023-12-25 DIAGNOSIS — I10 Essential (primary) hypertension: Secondary | ICD-10-CM | POA: Insufficient documentation

## 2023-12-25 DIAGNOSIS — R600 Localized edema: Secondary | ICD-10-CM | POA: Diagnosis not present

## 2023-12-25 NOTE — Patient Instructions (Signed)
 Medication Instructions:  Your physician recommends that you continue on your current medications as directed. Please refer to the Current Medication list given to you today.  *If you need a refill on your cardiac medications before your next appointment, please call your pharmacy*  Lab Work: A1c, Sed rate If you have labs (blood work) drawn today and your tests are completely normal, you will receive your results only by: MyChart Message (if you have MyChart) OR A paper copy in the mail If you have any lab test that is abnormal or we need to change your treatment, we will call you to review the results.  Testing/Procedures: none  Follow-Up: At [facility], you and your health needs are our priority.  As part of our continuing mission to provide you with exceptional heart care, our providers are all part of one team.  This team includes your primary Cardiologist (physician) and Advanced Practice Providers or APPs (Physician Assistants and Nurse Practitioners) who all work together to provide you with the care you need, when you need it.  Your next appointment:   As directed by your provider  Provider:   Dr. Okey  We recommend signing up for the patient portal called MyChart.  Sign up information is provided on this After Visit Summary.  MyChart is used to connect with patients for Virtual Visits (Telemedicine).  Patients are able to view lab/test results, encounter notes, upcoming appointments, etc.  Non-urgent messages can be sent to your provider as well.   To learn more about what you can do with MyChart, go to [email or web address].   Other Instructions none

## 2023-12-26 ENCOUNTER — Ambulatory Visit: Payer: Self-pay | Admitting: Internal Medicine

## 2023-12-26 LAB — HEMOGLOBIN A1C
Est. average glucose Bld gHb Est-mCnc: 108 mg/dL
Hgb A1c MFr Bld: 5.4 % (ref 4.8–5.6)

## 2023-12-26 LAB — SEDIMENTATION RATE: Sed Rate: 2 mm/h (ref 0–40)

## 2024-01-01 ENCOUNTER — Other Ambulatory Visit: Payer: Self-pay

## 2024-01-01 ENCOUNTER — Encounter: Payer: Self-pay | Admitting: Adult Health

## 2024-01-02 ENCOUNTER — Other Ambulatory Visit: Payer: Self-pay

## 2024-01-05 ENCOUNTER — Other Ambulatory Visit: Payer: Self-pay

## 2024-01-05 ENCOUNTER — Other Ambulatory Visit (HOSPITAL_COMMUNITY): Payer: Self-pay

## 2024-01-05 NOTE — Progress Notes (Signed)
 Specialty Pharmacy Refill Coordination Note  Spoke with Margaret Lopez is a 53 y.o. female contacted today regarding refills of specialty medication(s) OnabotulinumtoxinA  (Botox )  Injection appointment: 01/18/24  Patient requested: Courier to Provider Office   Delivery date: 01/15/24   Verified address: [facility] [address] [place] [address]  Medication will be filled on 01/12/24

## 2024-01-16 ENCOUNTER — Other Ambulatory Visit: Payer: Self-pay | Admitting: Adult Health

## 2024-01-18 ENCOUNTER — Ambulatory Visit (INDEPENDENT_AMBULATORY_CARE_PROVIDER_SITE_OTHER): Admitting: Adult Health

## 2024-01-18 VITALS — BP 120/69 | HR 68

## 2024-01-18 DIAGNOSIS — G43709 Chronic migraine without aura, not intractable, without status migrainosus: Secondary | ICD-10-CM | POA: Diagnosis not present

## 2024-01-18 MED ORDER — ONABOTULINUMTOXINA 100 UNITS IJ SOLR
155.0000 [IU] | Freq: Once | INTRAMUSCULAR | Status: AC
  Administered 2024-01-18: 155 [IU] via INTRAMUSCULAR

## 2024-01-18 NOTE — Progress Notes (Signed)
 Botox - 100 units x 2 vials Lot: [id number] Expiration: 06/2025 NDC: 9976-8854-98  Bacteriostatic 0.9% Sodium Chloride - 4 mL  Lot: [id number] Expiration: 10/2024 NDC: 9590-8033-97  Dx: H56.290  S/P Witnessed by Heather PEAK

## 2024-01-18 NOTE — Progress Notes (Signed)
 "  01/18/24: She reports that the combination of Botox  and Vyepti have been extremely beneficial for her headaches.  On average she may have 2 headaches a month.  She states that since the last visit she think she is use Toradol  only 3 times and Nurtec 3-4 times.  She does feel that the addition of Vyepti has reduced her migraines.  She returns today for Botox  injections.  Reports that she tolerated the last injections well.   10/25/23: This is the first Botox  injection with me.  Patient also receives Vyepti infusions. Usually have severe migraines 2 weeks before next botox  is due but after being on vepti it was only 1 week this time.  2 migraines a month on average. Can take either toradol  or nurtec with good benefit.    BOTOX  PROCEDURE NOTE FOR MIGRAINE HEADACHE    Contraindications and precautions discussed with patient(above). Aseptic procedure was observed and patient tolerated procedure. Procedure performed by Duwaine Russell, NP  The condition has existed for more than 6 months, and pt does not have a diagnosis of ALS, Myasthenia Gravis or Lambert-Eaton Syndrome.  Risks and benefits of injections discussed and pt agrees to proceed with the procedure.  Written consent obtained  These injections do not cause sedations or hallucinations which the oral therapies may cause.  Indication/Diagnosis: chronic migraine BOTOX (G9414) injection was performed according to protocol by Allergan. 200 units of BOTOX  was dissolved into 4 cc NS.   NDC: [address]-98  Type of toxin: Botox   Botox - 100 units x 2 vials Lot: [id number] Expiration: 06/2025 NDC: 9976-8854-98   Bacteriostatic 0.9% Sodium Chloride - 4 mL  Lot: [id number] Expiration: 10/2024 NDC: 9590-8033-97   Dx: H56.290   Description of procedure:  The patient was placed in a sitting position. The standard protocol was used for Botox  as follows, with 5 units of Botox  injected at each site:   -Procerus muscle, midline  injection  -Corrugator muscle, bilateral injection  -Frontalis muscle, bilateral injection, with 2 sites each side, medial injection was performed in the upper one third of the frontalis muscle, in the region vertical from the medial inferior edge of the superior orbital rim. The lateral injection was again in the upper one third of the forehead vertically above the lateral limbus of the cornea, 1.5 cm lateral to the medial injection site.  -Temporalis muscle injection, 4 sites, bilaterally. The first injection was 3 cm above the tragus of the ear, second injection site was 1.5 cm to 3 cm up from the first injection site in line with the tragus of the ear. The third injection site was 1.5-3 cm forward between the first 2 injection sites. The fourth injection site was 1.5 cm posterior to the second injection site.  -Occipitalis muscle injection, 3 sites, bilaterally. The first injection was done one half way between the occipital protuberance and the tip of the mastoid process behind the ear. The second injection site was done lateral and superior to the first, 1 fingerbreadth from the first injection. The third injection site was 1 fingerbreadth superiorly and medially from the first injection site.  -Cervical paraspinal muscle injection, 2 sites, bilateral knee first injection site was 1 cm from the midline of the cervical spine, 3 cm inferior to the lower border of the occipital protuberance. The second injection site was 1.5 cm superiorly and laterally to the first injection site.  -Trapezius muscle injection was performed at 3 sites, bilaterally. The first injection site was in the upper trapezius muscle halfway  between the inflection point of the neck, and the acromion. The second injection site was one half way between the acromion and the first injection site. The third injection was done between the first injection site and the inflection point of the neck.   Will return for repeat injection  in 3 months.   A 200 unit sof Botox  was used, 155 units were injected, the rest of the Botox  was wasted. The patient tolerated the procedure well, there were no complications of the above procedure.  Duwaine Russell, MSN, NP-C 01/18/2024, 8:14 AM [facility] [address] [place], [place] [address] [phone]  "

## 2024-01-30 ENCOUNTER — Other Ambulatory Visit (HOSPITAL_COMMUNITY): Payer: Self-pay | Admitting: *Deleted

## 2024-01-30 DIAGNOSIS — F319 Bipolar disorder, unspecified: Secondary | ICD-10-CM

## 2024-01-30 MED ORDER — LITHIUM CARBONATE ER 300 MG PO TBCR: 600.0000 mg | EXTENDED_RELEASE_TABLET | Freq: Every evening | ORAL | 0 refills | Status: DC

## 2024-02-13 ENCOUNTER — Telehealth (HOSPITAL_COMMUNITY): Admitting: Family

## 2024-02-13 DIAGNOSIS — F3130 Bipolar disorder, current episode depressed, mild or moderate severity, unspecified: Secondary | ICD-10-CM

## 2024-02-13 DIAGNOSIS — F319 Bipolar disorder, unspecified: Secondary | ICD-10-CM

## 2024-02-13 MED ORDER — LITHIUM CARBONATE ER 300 MG PO TBCR: 600.0000 mg | EXTENDED_RELEASE_TABLET | Freq: Every evening | ORAL | 0 refills | Status: AC

## 2024-02-13 MED ORDER — GUANFACINE HCL ER 2 MG PO TB24: 2.0000 mg | ORAL_TABLET | Freq: Every day | ORAL | 0 refills | Status: AC

## 2024-02-13 MED ORDER — ARIPIPRAZOLE 5 MG PO TABS: 5.0000 mg | ORAL_TABLET | Freq: Every day | ORAL | 0 refills | Status: AC

## 2024-02-13 MED ORDER — DIVALPROEX SODIUM ER 500 MG PO TB24: 1500.0000 mg | ORAL_TABLET | Freq: Every day | ORAL | 0 refills | Status: AC

## 2024-02-29 ENCOUNTER — Ambulatory Visit: Admitting: Neurology

## 2024-04-22 ENCOUNTER — Ambulatory Visit: Admitting: Adult Health

## 2024-04-25 ENCOUNTER — Ambulatory Visit: Admitting: Adult Health
